# Patient Record
Sex: Male | Born: 1955 | Race: White | Hispanic: No | Marital: Single | Smoking: Current every day smoker
Health system: Southern US, Community
[De-identification: ages and names within clinical notes are randomized; demographics above are authoritative.]

## PROBLEM LIST (undated history)

## (undated) DIAGNOSIS — R251 Tremor, unspecified: Secondary | ICD-10-CM

## (undated) DIAGNOSIS — G35 Multiple sclerosis: Secondary | ICD-10-CM

## (undated) DIAGNOSIS — J449 Chronic obstructive pulmonary disease, unspecified: Secondary | ICD-10-CM

## (undated) DIAGNOSIS — M549 Dorsalgia, unspecified: Secondary | ICD-10-CM

## (undated) DIAGNOSIS — R0600 Dyspnea, unspecified: Secondary | ICD-10-CM

## (undated) DIAGNOSIS — R011 Cardiac murmur, unspecified: Secondary | ICD-10-CM

## (undated) HISTORY — DX: Tremor, unspecified: R25.1

## (undated) HISTORY — PX: TONSILLECTOMY: SUR1361

## (undated) HISTORY — DX: Chronic obstructive pulmonary disease, unspecified: J44.9

## (undated) HISTORY — DX: Multiple sclerosis: G35

## (undated) HISTORY — PX: OTHER SURGICAL HISTORY: SHX169

## (undated) HISTORY — PX: ADENOIDECTOMY: SUR15

---

## 2001-02-24 ENCOUNTER — Emergency Department (HOSPITAL_COMMUNITY): Admission: EM | Admit: 2001-02-24 | Discharge: 2001-02-24 | Payer: Self-pay | Admitting: Emergency Medicine

## 2004-07-09 ENCOUNTER — Emergency Department (HOSPITAL_COMMUNITY): Admission: EM | Admit: 2004-07-09 | Discharge: 2004-07-09 | Payer: Self-pay | Admitting: Family Medicine

## 2007-01-03 ENCOUNTER — Emergency Department (HOSPITAL_COMMUNITY): Admission: EM | Admit: 2007-01-03 | Discharge: 2007-01-04 | Payer: Self-pay | Admitting: Emergency Medicine

## 2007-01-23 ENCOUNTER — Ambulatory Visit: Payer: Self-pay | Admitting: Internal Medicine

## 2008-10-17 ENCOUNTER — Emergency Department (HOSPITAL_COMMUNITY): Admission: EM | Admit: 2008-10-17 | Discharge: 2008-10-17 | Payer: Self-pay | Admitting: Family Medicine

## 2008-12-29 ENCOUNTER — Encounter: Payer: Self-pay | Admitting: Pulmonary Disease

## 2008-12-29 ENCOUNTER — Emergency Department (HOSPITAL_COMMUNITY): Admission: EM | Admit: 2008-12-29 | Discharge: 2008-12-29 | Payer: Self-pay | Admitting: Emergency Medicine

## 2009-02-17 ENCOUNTER — Ambulatory Visit: Payer: Self-pay | Admitting: Pulmonary Disease

## 2009-02-17 DIAGNOSIS — R0602 Shortness of breath: Secondary | ICD-10-CM

## 2009-02-17 DIAGNOSIS — J438 Other emphysema: Secondary | ICD-10-CM | POA: Insufficient documentation

## 2009-02-17 DIAGNOSIS — R930 Abnormal findings on diagnostic imaging of skull and head, not elsewhere classified: Secondary | ICD-10-CM | POA: Insufficient documentation

## 2009-03-02 ENCOUNTER — Ambulatory Visit: Payer: Self-pay | Admitting: Pulmonary Disease

## 2009-03-13 ENCOUNTER — Encounter: Payer: Self-pay | Admitting: Pulmonary Disease

## 2009-04-10 ENCOUNTER — Ambulatory Visit: Payer: Self-pay | Admitting: Pulmonary Disease

## 2009-05-04 ENCOUNTER — Ambulatory Visit: Payer: Self-pay | Admitting: Internal Medicine

## 2009-11-06 ENCOUNTER — Emergency Department (HOSPITAL_COMMUNITY): Admission: EM | Admit: 2009-11-06 | Discharge: 2009-11-06 | Payer: Self-pay | Admitting: Emergency Medicine

## 2009-12-04 ENCOUNTER — Ambulatory Visit: Payer: Self-pay | Admitting: Pulmonary Disease

## 2010-06-15 ENCOUNTER — Telehealth (INDEPENDENT_AMBULATORY_CARE_PROVIDER_SITE_OTHER): Payer: Self-pay | Admitting: *Deleted

## 2010-08-17 ENCOUNTER — Emergency Department (HOSPITAL_COMMUNITY)
Admission: EM | Admit: 2010-08-17 | Discharge: 2010-08-17 | Payer: Self-pay | Source: Home / Self Care | Admitting: Emergency Medicine

## 2010-08-28 NOTE — Assessment & Plan Note (Signed)
Summary: rov for emphysema, cough   CC:  Pt is here for a 6 month f/u appt.  Pt states Symbicort is not helping with sob.  Pt c/o increased coughing and has difficulty coughing up sputum.  when he can cough up sputum and it is green in color.  pt also c/o tightness in chest.  Pt has decreased smoking down to 1/2 ppd.  .  History of Present Illness: The pt comes in today for f/u of his known obstructive disease and ongoing cough.  He is overdue for his f/u visit.  At the last visit, he was started on symbicort and asked to stop smoking.  Unfortunately, he continues to smoke at least a 1/2ppd, and is continuing to have a chronic cough that is debilitating at times.  He does not feel the symbicort has helped either his cough or his sob.  His cough produces very little mucus.    Medications Prior to Update: 1)  Symbicort 160-4.5 Mcg/act  Aero (Budesonide-Formoterol Fumarate) .... Two Puffs Twice Daily  Allergies (verified): 1)  Vicodin  Social History: Patient is a current smoker.  1 ppd x 70yrs..  currently smoking 1/2 ppd.  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  Pt states Symbicort is not helping with sob.  Pt c/o increased coughing and has difficulty coughing up sputum.  when he can cough up sputum, it is green in color.  pt also c/o tightness in chest.  Pt has decreased smoking down to 1/2 ppd.   Comments Medications reviewed with patient Arman Filter LPN  Dec 05, 3555 3:46 PM    Physical Exam  General:  o/w   male in nad Lungs:  isolated wheeze at left base, o/w completely clear. Heart:  rrr, no mrg Extremities:  no edema or cyanosis Neurologic:  alert and oriented, moves all 4.   Impression & Recommendations:  Problem # 1:  EMPHYSEMA (ICD-492.8)  the pt has moderate obstructive disease on pfts last year, and I suspect most of that is fixed and therefore due to emphysema.  He has not seen a big difference with symbicort on his cough or shortness of breath, however he continues to smoke at least a 1/2 ppd.  I will try him on spiriva, but explained that his cough or breathing is unlikely to improve unless he quits smoking.  I have asked him to also try tessalon pearls for his cough, and an otc ppi for a few weeks to see if he notices a difference.  Ultimately, I think smoking cessation is the answer.  He will update me on his progress with spiriva in 3 weeks, and may need a f/u cxr if cough is persisting.  Medications Added to Medication List This Visit: 1)  Tessalon Perles 100 Mg Caps (Benzonatate) .Marland KitchenMarland KitchenMarland Kitchen  Two by mouth every 6 hrs if needed for cough  Other Orders: Est. Patient Level III (16109)  Patient Instructions: 1)  stop smoking.  Your cough is unlikely to go away until this is done. 2)  stop symbicort, and start spiriva one each am. 3)  can try tessalon pearls 2 every 6 hrs if needed for cough. 4)  consider trying prilosec otc one before evening meal 5)  give me some feedback in 3 weeks with how things are going.  Prescriptions: TESSALON PERLES 100 MG  CAPS (BENZONATATE) two by mouth every 6 hrs if needed for cough  #40 x 2   Entered and Authorized by:   Barbaraann Share MD   Signed by:   Barbaraann Share MD on 12/04/2009   Method used:   Print then Give to Patient   RxID:   [id number]

## 2010-08-28 NOTE — Progress Notes (Signed)
  Phone Note Other Incoming   Request: Send information Summary of Call: Request received from Disability Determination Services forwarded to [facility].       

## 2010-10-18 ENCOUNTER — Emergency Department (HOSPITAL_COMMUNITY)
Admission: EM | Admit: 2010-10-18 | Discharge: 2010-10-19 | Disposition: A | Discharge status: Home / Self Care | Payer: Self-pay | Attending: Emergency Medicine | Admitting: Emergency Medicine

## 2010-10-18 DIAGNOSIS — J449 Chronic obstructive pulmonary disease, unspecified: Secondary | ICD-10-CM | POA: Insufficient documentation

## 2010-10-18 DIAGNOSIS — M545 Low back pain, unspecified: Secondary | ICD-10-CM | POA: Insufficient documentation

## 2010-10-18 DIAGNOSIS — IMO0002 Reserved for concepts with insufficient information to code with codable children: Secondary | ICD-10-CM | POA: Insufficient documentation

## 2010-10-18 DIAGNOSIS — M533 Sacrococcygeal disorders, not elsewhere classified: Secondary | ICD-10-CM | POA: Insufficient documentation

## 2010-10-18 DIAGNOSIS — J4489 Other specified chronic obstructive pulmonary disease: Secondary | ICD-10-CM | POA: Insufficient documentation

## 2010-11-05 LAB — DIFFERENTIAL
Basophils Absolute: 0 10*3/uL (ref 0.0–0.1)
Basophils Relative: 1 % (ref 0–1)
Eosinophils Absolute: 0.1 10*3/uL (ref 0.0–0.7)
Eosinophils Relative: 3 % (ref 0–5)
Lymphocytes Relative: 29 % (ref 12–46)
Lymphs Abs: 1.5 10*3/uL (ref 0.7–4.0)
Monocytes Absolute: 0.3 10*3/uL (ref 0.1–1.0)
Monocytes Relative: 7 % (ref 3–12)
Neutro Abs: 3.2 10*3/uL (ref 1.7–7.7)
Neutrophils Relative %: 62 % (ref 43–77)

## 2010-11-05 LAB — CBC
HCT: 53.8 % — ABNORMAL HIGH (ref 39.0–52.0)
Hemoglobin: 18.6 g/dL — ABNORMAL HIGH (ref 13.0–17.0)
MCHC: 34.6 g/dL (ref 30.0–36.0)
MCV: 97.4 fL (ref 78.0–100.0)
Platelets: 230 10*3/uL (ref 150–400)
RBC: 5.52 MIL/uL (ref 4.22–5.81)
RDW: 12.6 % (ref 11.5–15.5)
WBC: 5.1 10*3/uL (ref 4.0–10.5)

## 2010-11-05 LAB — BASIC METABOLIC PANEL
BUN: 9 mg/dL (ref 6–23)
CO2: 24 mEq/L (ref 19–32)
Calcium: 9 mg/dL (ref 8.4–10.5)
Chloride: 106 mEq/L (ref 96–112)
Creatinine, Ser: 0.73 mg/dL (ref 0.4–1.5)
GFR calc Af Amer: >60 mL/min (ref >60)
GFR calc non Af Amer: >60 mL/min (ref >60)
Glucose, Bld: 93 mg/dL (ref 70–99)
Potassium: 4.2 mEq/L (ref 3.5–5.1)
Sodium: 137 mEq/L (ref 135–145)

## 2010-11-22 ENCOUNTER — Emergency Department (HOSPITAL_COMMUNITY)
Admission: EM | Admit: 2010-11-22 | Discharge: 2010-11-22 | Disposition: A | Discharge status: Home / Self Care | Payer: Self-pay | Attending: Emergency Medicine | Admitting: Emergency Medicine

## 2010-11-22 DIAGNOSIS — G8929 Other chronic pain: Secondary | ICD-10-CM | POA: Insufficient documentation

## 2010-11-22 DIAGNOSIS — J449 Chronic obstructive pulmonary disease, unspecified: Secondary | ICD-10-CM | POA: Insufficient documentation

## 2010-11-22 DIAGNOSIS — IMO0002 Reserved for concepts with insufficient information to code with codable children: Secondary | ICD-10-CM | POA: Insufficient documentation

## 2010-11-22 DIAGNOSIS — M545 Low back pain, unspecified: Secondary | ICD-10-CM | POA: Insufficient documentation

## 2010-11-22 DIAGNOSIS — M79609 Pain in unspecified limb: Secondary | ICD-10-CM | POA: Insufficient documentation

## 2010-11-22 DIAGNOSIS — R259 Unspecified abnormal involuntary movements: Secondary | ICD-10-CM | POA: Insufficient documentation

## 2010-11-22 DIAGNOSIS — J4489 Other specified chronic obstructive pulmonary disease: Secondary | ICD-10-CM | POA: Insufficient documentation

## 2011-03-11 ENCOUNTER — Other Ambulatory Visit (HOSPITAL_COMMUNITY): Payer: Self-pay | Admitting: Family Medicine

## 2011-03-11 DIAGNOSIS — M545 Low back pain: Secondary | ICD-10-CM

## 2011-03-14 ENCOUNTER — Ambulatory Visit (HOSPITAL_COMMUNITY)
Admission: RE | Admit: 2011-03-14 | Discharge: 2011-03-14 | Disposition: A | Discharge status: Home / Self Care | Payer: Self-pay | Source: Ambulatory Visit | Attending: Family Medicine | Admitting: Family Medicine

## 2011-03-14 DIAGNOSIS — M5137 Other intervertebral disc degeneration, lumbosacral region: Secondary | ICD-10-CM | POA: Insufficient documentation

## 2011-03-14 DIAGNOSIS — M545 Low back pain: Secondary | ICD-10-CM

## 2011-03-14 DIAGNOSIS — M51379 Other intervertebral disc degeneration, lumbosacral region without mention of lumbar back pain or lower extremity pain: Secondary | ICD-10-CM | POA: Insufficient documentation

## 2011-04-14 ENCOUNTER — Emergency Department (HOSPITAL_COMMUNITY)
Admission: EM | Admit: 2011-04-14 | Discharge: 2011-04-14 | Disposition: A | Discharge status: Home / Self Care | Payer: Self-pay | Attending: Emergency Medicine | Admitting: Emergency Medicine

## 2011-04-14 DIAGNOSIS — Z87891 Personal history of nicotine dependence: Secondary | ICD-10-CM | POA: Insufficient documentation

## 2011-04-14 DIAGNOSIS — M545 Low back pain, unspecified: Secondary | ICD-10-CM | POA: Insufficient documentation

## 2011-06-15 ENCOUNTER — Emergency Department (HOSPITAL_COMMUNITY)
Admission: EM | Admit: 2011-06-15 | Discharge: 2011-06-15 | Disposition: A | Discharge status: Home / Self Care | Payer: Self-pay | Attending: Emergency Medicine | Admitting: Emergency Medicine

## 2011-06-15 ENCOUNTER — Encounter: Payer: Self-pay | Admitting: Emergency Medicine

## 2011-06-15 DIAGNOSIS — M545 Low back pain, unspecified: Secondary | ICD-10-CM | POA: Insufficient documentation

## 2011-06-15 DIAGNOSIS — F17200 Nicotine dependence, unspecified, uncomplicated: Secondary | ICD-10-CM | POA: Insufficient documentation

## 2011-06-15 HISTORY — DX: Dorsalgia, unspecified: M54.9

## 2011-06-15 MED ORDER — OXYCODONE-ACETAMINOPHEN 5-325 MG PO TABS: 1.0000 | ORAL_TABLET | Freq: Four times a day (QID) | ORAL | Status: AC | PRN

## 2011-06-15 MED ORDER — ONDANSETRON 4 MG PO TBDP
ORAL_TABLET | ORAL | Status: AC
  Administered 2011-06-15: 8 mg
  Filled 2011-06-15: qty 2

## 2011-06-15 MED ORDER — ONDANSETRON HCL 8 MG PO TABS: 8.0000 mg | ORAL_TABLET | Freq: Once | ORAL | Status: DC

## 2011-06-15 MED ORDER — OXYCODONE-ACETAMINOPHEN 5-325 MG PO TABS
1.0000 | ORAL_TABLET | Freq: Once | ORAL | Status: AC
  Administered 2011-06-15: 1 via ORAL
  Filled 2011-06-15: qty 1

## 2011-06-15 NOTE — ED Notes (Signed)
Pt reports low back pain onset last night. Pt lifting heavy object. Pain starts on left side and radiates to right side.

## 2011-06-15 NOTE — ED Provider Notes (Signed)
History     CSN: [id number] Arrival date & time: 06/15/2011  2:10 PM   First MD Initiated Contact with Patient 06/15/11 1500      Chief Complaint  Patient presents with  . Back Pain    (Consider location/radiation/quality/duration/timing/severity/associated sxs/prior treatment) The history is provided by the patient.   reports ongoing low back pain has been present for several months.  He had an MRI scan done demonstrating some disc protrusions and central canal narrowing without acute pathology.  He follows that [facility].  The patient has nothing for pain.  He normally gets his pain medicine from the emergency department.  Denies urinary frequency retention or hesitancy.  He denies dysuria.  He denies nausea vomiting or abdominal pain.  Denies unilateral leg weakness.  He denies perineal numbness.  Reports his symptoms are similar to his back pain in the past except as pain on his right side is a little more severe and radiates to his right thigh.  Nothing worsens the symptoms symptoms.  Nothing improves his symptoms.   Past Medical History  Diagnosis Date  . Back pain     Past Surgical History  Procedure Date  . Tonsillectomy   . Adenoidectomy     History reviewed. No pertinent family history.  History  Substance Use Topics  . Smoking status: Current Everyday Smoker  . Smokeless tobacco: Never Used  . Alcohol Use: No     quit 2 years ago      Review of Systems  All other systems reviewed and are negative.    Allergies  Hydrocodone-acetaminophen  Home Medications  No current outpatient prescriptions on file.  BP 126/79  Pulse 85  Temp(Src) 98.3 F (36.8 C) (Oral)  Resp 22  SpO2 97%  Physical Exam  Nursing note and vitals reviewed. Constitutional: He is oriented to person, place, and time. He appears well-developed and well-nourished.  HENT:  Head: Normocephalic and atraumatic.  Eyes: EOM are normal.  Neck: Normal range of motion.    Cardiovascular: Normal rate, regular rhythm, normal heart sounds and intact distal pulses.   Pulmonary/Chest: Effort normal and breath sounds normal. No respiratory distress.  Abdominal: Soft. He exhibits no distension. There is no tenderness.  Musculoskeletal:       Father 5 strength in bilateral lower extremities with normal reflexes.  Neurological: He is alert and oriented to person, place, and time.  Skin: Skin is warm and dry.  Psychiatric: He has a normal mood and affect. Judgment normal.    ED Course  Procedures (including critical care time)  Labs Reviewed - No data to display No results found. MRI LUMBAR SPINE WITHOUT CONTRAST  Technique: Multiplanar and multiecho pulse sequences of the lumbar  spine were obtained without intravenous contrast.  Comparison: None.  Findings: Vertebral body height is maintained. 0.3 cm of  retrolisthesis of L3 on L4 is noted. Alignment is otherwise  unremarkable. Marrow signal is somewhat heterogeneous with  scattered degenerative discogenic marrow signal change also noted.  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 The conus medullaris is normal in  signal and position. Imaged intra-abdominal contents are  unremarkable.  The T10-11 and T11-12 levels are imaged in the sagittal plane only  and negative.  T12-L1: Negative.  L1-2: Negative.  L2-3: Negative.  L3-4: The patient has a disc bulge eccentrically prominent to the  right and epidural fat is prominent. There is moderate central  canal narrowing and right lateral recess narrowing. Right foramen  is also mildly narrowed.  Left foramen open.  L4-5: Disc bulge causes mild narrowing of the lateral recesses,  greater on the right. Thecal sac is patent. Foramina are open.  L5-S1: Mild disc bulge with some endplate spurring is seen.  Central canal and right foramina widely patent. Mild left  foraminal narrowing noted.  IMPRESSION:  1. 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 Mild right foraminal narrowing  is also present at this level.  2. Disc bulge at L4-5 causes mild narrowing of the lateral  recesses.   1. Low back pain       MDM  Normal lower extremity neurologic exam. No bowel or bladder complaints.  Likely musculoskeletal back pain. Doubt spinal epidural abscess. Doubt cauda equina. Doubt abdominal aortic aneurysm  Home with pain medicine and followup with his primary care physician Dr. Orson Aloe, MD 06/15/11 [phone]

## 2011-10-07 ENCOUNTER — Encounter (HOSPITAL_COMMUNITY): Payer: Self-pay | Admitting: *Deleted

## 2011-10-07 ENCOUNTER — Emergency Department (HOSPITAL_COMMUNITY)
Admission: EM | Admit: 2011-10-07 | Discharge: 2011-10-07 | Disposition: A | Discharge status: Home / Self Care | Payer: Self-pay | Attending: Emergency Medicine | Admitting: Emergency Medicine

## 2011-10-07 DIAGNOSIS — IMO0002 Reserved for concepts with insufficient information to code with codable children: Secondary | ICD-10-CM

## 2011-10-07 DIAGNOSIS — G8929 Other chronic pain: Secondary | ICD-10-CM | POA: Insufficient documentation

## 2011-10-07 DIAGNOSIS — F17200 Nicotine dependence, unspecified, uncomplicated: Secondary | ICD-10-CM | POA: Insufficient documentation

## 2011-10-07 DIAGNOSIS — M51379 Other intervertebral disc degeneration, lumbosacral region without mention of lumbar back pain or lower extremity pain: Secondary | ICD-10-CM | POA: Insufficient documentation

## 2011-10-07 DIAGNOSIS — M5137 Other intervertebral disc degeneration, lumbosacral region: Secondary | ICD-10-CM | POA: Insufficient documentation

## 2011-10-07 DIAGNOSIS — M549 Dorsalgia, unspecified: Secondary | ICD-10-CM

## 2011-10-07 MED ORDER — TRAMADOL HCL 50 MG PO TABS
100.0000 mg | ORAL_TABLET | Freq: Once | ORAL | Status: AC
  Administered 2011-10-07: 100 mg via ORAL
  Filled 2011-10-07: qty 2

## 2011-10-07 MED ORDER — TRAMADOL HCL 50 MG PO TABS: 50.0000 mg | ORAL_TABLET | Freq: Four times a day (QID) | ORAL | Status: AC | PRN

## 2011-10-07 MED ORDER — METHOCARBAMOL 500 MG PO TABS: 500.0000 mg | ORAL_TABLET | Freq: Two times a day (BID) | ORAL | Status: AC

## 2011-10-07 NOTE — ED Provider Notes (Signed)
History     CSN: [id number]  Arrival date & time 10/07/11  1607   First MD Initiated Contact with Patient 10/07/11 2004      Chief Complaint  Patient presents with  . Back Pain     HPI  History provided by the patient. Patient is a 56 year old male with histories of chronic back pain who presents with increased low back pain beginning this morning after taking out the trash. Patient states he has had chronic back pains off and on for the past 3-4 years. Patient is currently without medical insurance reports difficulty having care for his back pain symptoms. [facility] and does report having 2 visits to [facility]opedics. Patient also reports having an MRI performed here at the hospital last year. He states he had some bulging discs and was recommended to have injections as treatment. Patient has been unable to followup for these treatments. Currently patient states he is taking a muscle relaxer prescribed from [facility]. This morning after twisting and taking out the trash he had increased low back pains. He tried taking muscle relaxer without improvement. Pain is worse with movements and walking. Pain does not radiate it stays in the low back. He denies any associated lower leg numbness or weakness. Denies urinary or fecal incontinence, urinary retention peroneal numbness. Patient requesting help with pain control and symptom management.   Past Medical History  Diagnosis Date  . Back pain     Past Surgical History  Procedure Date  . Tonsillectomy   . Adenoidectomy     History reviewed. No pertinent family history.  History  Substance Use Topics  . Smoking status: Current Everyday Smoker  . Smokeless tobacco: Never Used  . Alcohol Use: No     quit 2 years ago      Review of Systems  All other systems reviewed and are negative.    Allergies  Hydrocodone-acetaminophen  Home Medications  No current outpatient prescriptions  on file.  BP 129/80  Pulse 85  Temp(Src) 96.7 F (35.9 C) (Oral)  Resp 15  SpO2 98%  Physical Exam  Nursing note and vitals reviewed. Constitutional: He is oriented to person, place, and time. He appears well-developed and well-nourished. No distress.  HENT:  Head: Normocephalic and atraumatic.  Neck: Normal range of motion. Neck supple.  Cardiovascular: Normal rate and regular rhythm.   Pulmonary/Chest: Effort normal and breath sounds normal. He has no wheezes. He has no rales.  Abdominal: Soft. He exhibits no distension. There is no tenderness. There is no rebound.  Musculoskeletal: He exhibits no edema and no tenderness.       Lumbar back: He exhibits tenderness.       Back:  Neurological: He is alert and oriented to person, place, and time. He has normal strength. No sensory deficit. Gait normal.  Reflex Scores:      Patellar reflexes are 2+ on the right side and 1+ on the left side. Skin: Skin is warm. No rash noted. No erythema.  Psychiatric: He has a normal mood and affect. His behavior is normal.    ED Course  Procedures     1. Chronic back pain   2. Bulging disc       MDM  8:05 PM patient seen and evaluated. Patient no acute distress.        Angus Seller, [place] 10/09/11 [phone]

## 2011-10-07 NOTE — Discharge Instructions (Signed)
You were seen and evaluated today for your complaints of low back pains. It is recommended that he continue to followup with the primary care provider for continued evaluation and treatment. Return to emergency room if you develop any worsening or severe pains, weakness or paralysis in your lower legs, or loss of control your bowels or bladder.  Back Exercises Back exercises help treat and prevent back injuries. The goal of back exercises is to increase the strength of your abdominal and back muscles and the flexibility of your back. These exercises should be started when you no longer have back pain. Back exercises include:  Pelvic Tilt. Lie on your back with your knees bent. Tilt your pelvis until the lower part of your back is against the floor. Hold this position 5 to 10 sec and repeat 5 to 10 times.   Knee to Chest. Pull first 1 knee up against your chest and hold for 20 to 30 seconds, repeat this with the other knee, and then both knees. This may be done with the other leg straight or bent, whichever feels better.   Sit-Ups or Curl-Ups. Bend your knees 90 degrees. Start with tilting your pelvis, and do a partial, slow sit-up, lifting your trunk only 30 to 45 degrees off the floor. Take at least 2 to 3 seconds for each sit-up. Do not do sit-ups with your knees out straight. If partial sit-ups are difficult, simply do the above but with only tightening your abdominal muscles and holding it as directed.   Hip-Lift. Lie on your back with your knees flexed 90 degrees. Push down with your feet and shoulders as you raise your hips a couple inches off the floor; hold for 10 seconds, repeat 5 to 10 times.   Back arches. Lie on your stomach, propping yourself up on bent elbows. Slowly press on your hands, causing an arch in your low back. Repeat 3 to 5 times. Any initial stiffness and discomfort should lessen with repetition over time.   Shoulder-Lifts. Lie face down with arms beside your body. Keep hips  and torso pressed to floor as you slowly lift your head and shoulders off the floor.  Do not overdo your exercises, especially in the beginning. Exercises may cause you some mild back discomfort which lasts for a few minutes; however, if the pain is more severe, or lasts for more than 15 minutes, do not continue exercises until you see your caregiver. Improvement with exercise therapy for back problems is slow.  See your caregivers for assistance with developing a proper back exercise program. Document Released: 08/22/2004 Document Revised: 07/04/2011 Document Reviewed: 07/15/2005 [facility] Patient Information 2012 [place], [place].   Chronic Back Pain When back pain lasts longer than 3 months, it is called chronic back pain.This pain can be frustrating, but the cause of the pain is rarely dangerous.People with chronic back pain often go through certain periods that are more intense (flare-ups). CAUSES Chronic back pain can be caused by wear and tear (degeneration) on different structures in your back. These structures may include bones, ligaments, or discs. This degeneration may result in more pressure being placed on the nerves that travel to your legs and feet. This can lead to pain traveling from the low back down the back of the legs. When pain lasts longer than 3 months, it is not unusual for people to experience anxiety or depression. Anxiety and depression can also contribute to low back pain. TREATMENT  Establish a regular exercise plan. This is  critical to improving your functional level.   Have a self-management plan for when you flare-up. Flare-ups rarely require a medical visit. Regular exercise will help reduce the intensity and frequency of your flare-ups.   Manage how you feel about your back pain and the rest of your life. Anxiety, depression, and feeling that you cannot alter your back pain have been shown to make back pain more intense and debilitating.   Medicines should never  be your only treatment. They should be used along with other treatments to help you return to a more active lifestyle.   Procedures such as injections or surgery may be helpful but are rarely necessary. You may be able to get the same results with physical therapy or chiropractic care.  HOME CARE INSTRUCTIONS  Avoid bending, heavy lifting, prolonged sitting, and activities which make the problem worse.   Continue normal activity as much as possible.   Take brief periods of rest throughout the day to reduce your pain during flare-ups.   Follow your back exercise rehabilitation program. This can help reduce symptoms and prevent more pain.   Only take over-the-counter or prescription medicines as directed by your caregiver. Muscle relaxants are sometimes prescribed. Narcotic pain medicine is discouraged for long-term pain, since addiction is a possible outcome.   If you smoke, quit.   Eat healthy foods and maintain a recommended body weight.  SEEK IMMEDIATE MEDICAL CARE IF:   You have weakness or numbness in one of your legs or feet.   You have trouble controlling your bladder or bowels.   You develop nausea, vomiting, abdominal pain, shortness of breath, or fainting.  Document Released: 08/22/2004 Document Revised: 07/04/2011 Document Reviewed: 06/29/2011 [facility] Patient Information 2012 [place], [place].    RESOURCE GUIDE  Dental Problems  Patients with Medicaid: [facility] [phone] [address]                                           [phone] [address] Phone:  [phone]                                                  Phone:  [phone]  If unable to pay or uninsured, contact:  [facility] or [facility] to become qualified for the adult dental clinic.  Chronic Pain Problems Contact [facility]  [phone] Patients need to be referred by their primary care doctor.  Insufficient Money  for Medicine Contact [employer] Way:  call "211" or [facility] [phone].  No Primary Care Doctor Call [facility]  [phone] Other agencies that provide inexpensive medical care    [facility]  [phone]    [facility]  [phone]    [facility]  [phone]    [facility]  [phone]    [facility]  [phone]    [facility]  [phone]  Psychological Services [facility]  [phone] [facility]  [phone] [facility] Mental [facility]   [phone] (emergency services ([phone])  Substance Abuse Resources [facility]  ([phone]  [facility] 205-088-8475 [facility] 325-742-4733 Floydene Flock ([phone] Residential & Outpatient Substance Abuse Program  6140270621  Abuse/Neglect [facility] Child Abuse Hotline 509-318-4518 [facility] Child Abuse Hotline 4408748193 (After Hours)  Emergency Shelter [facility] [phone]) [phone]  Maternity Homes [facility] at the [place] [facility] [place] 8137598705 [facility] ([phone]  MRSA Hotline #:   386 540 3528    [facility] Resources  [facility]     [employer]                          [facility] [address] [place]                       [address]      [address]  Blondell Reveal Phone:  [phone]                                   Phone:  [phone]                 Phone:  [phone]  [facility] Mental Health Phone:  [phone]  [facility] Child Abuse Hotline 5177697897 [phone] (After Hours)

## 2011-10-07 NOTE — ED Notes (Signed)
To ed for eval of lower back pain. States he 'pulled it this morning when moving trash cans'. Ambulatory into triage. States he has had this pain before and treated with injections.

## 2011-10-09 NOTE — ED Provider Notes (Signed)
Medical screening examination/treatment/procedure(s) were performed by non-physician practitioner and as supervising physician I was immediately available for consultation/collaboration.  Cyndra Numbers, MD 10/09/11 [phone]

## 2011-12-17 ENCOUNTER — Encounter (HOSPITAL_COMMUNITY): Payer: Self-pay | Admitting: Physical Medicine and Rehabilitation

## 2011-12-17 ENCOUNTER — Emergency Department (HOSPITAL_COMMUNITY)
Admission: EM | Admit: 2011-12-17 | Discharge: 2011-12-17 | Disposition: A | Discharge status: Home / Self Care | Payer: Self-pay | Attending: Emergency Medicine | Admitting: Emergency Medicine

## 2011-12-17 DIAGNOSIS — M545 Low back pain, unspecified: Secondary | ICD-10-CM | POA: Insufficient documentation

## 2011-12-17 DIAGNOSIS — M79609 Pain in unspecified limb: Secondary | ICD-10-CM | POA: Insufficient documentation

## 2011-12-17 DIAGNOSIS — G8929 Other chronic pain: Secondary | ICD-10-CM | POA: Insufficient documentation

## 2011-12-17 LAB — URINALYSIS, ROUTINE W REFLEX MICROSCOPIC
Bilirubin Urine: NEGATIVE
Hgb urine dipstick: NEGATIVE
Ketones, ur: NEGATIVE mg/dL
Leukocytes, UA: NEGATIVE
Nitrite: NEGATIVE
Protein, ur: NEGATIVE mg/dL
Specific Gravity, Urine: 1 — ABNORMAL LOW (ref 1.005–1.030)
Urobilinogen, UA: 0.2 mg/dL (ref 0.0–1.0)

## 2011-12-17 MED ORDER — OXYCODONE-ACETAMINOPHEN 5-325 MG PO TABS: 2.0000 | ORAL_TABLET | Freq: Four times a day (QID) | ORAL | Status: AC | PRN

## 2011-12-17 MED ORDER — OXYCODONE-ACETAMINOPHEN 5-325 MG PO TABS
1.0000 | ORAL_TABLET | Freq: Once | ORAL | Status: AC
  Administered 2011-12-17: 1 via ORAL
  Filled 2011-12-17: qty 1

## 2011-12-17 MED ORDER — PREDNISONE (PAK) 10 MG PO TABS: ORAL_TABLET | ORAL | Status: DC

## 2011-12-17 NOTE — ED Provider Notes (Signed)
History     CSN: [id number]  Arrival date & time 12/17/11  1717   First MD Initiated Contact with Patient 12/17/11 1836      Chief Complaint  Patient presents with  . Back Pain    (Consider location/radiation/quality/duration/timing/severity/associated sxs/prior treatment) Patient is a 56 y.o. male presenting with back pain. The history is provided by the patient. No language interpreter was used.  Back Pain  This is a recurrent problem. The current episode started yesterday. The problem occurs constantly. The problem has been gradually worsening. The pain is associated with twisting. The pain is present in the lumbar spine. The quality of the pain is described as shooting. The pain radiates to the left thigh. The pain is moderate. The symptoms are aggravated by bending and twisting. Pertinent negatives include no numbness, no bowel incontinence, no perianal numbness, no bladder incontinence and no weakness. He has tried NSAIDs for the symptoms. The treatment provided no relief.    Past Medical History  Diagnosis Date  . Back pain     Past Surgical History  Procedure Date  . Tonsillectomy   . Adenoidectomy     History reviewed. No pertinent family history.  History  Substance Use Topics  . Smoking status: Current Everyday Smoker  . Smokeless tobacco: Never Used  . Alcohol Use: No     quit 2 years ago      Review of Systems  Gastrointestinal: Negative for bowel incontinence.  Genitourinary: Negative for bladder incontinence.  Musculoskeletal: Positive for back pain.  Neurological: Negative for weakness and numbness.  All other systems reviewed and are negative.    Allergies  Hydrocodone-acetaminophen  Home Medications   Current Outpatient Rx  Name Route Sig Dispense Refill  . IBUPROFEN 600 MG PO TABS Oral Take 600 mg by mouth every 6 (six) hours as needed. For pain.    Marland Kitchen TRAMADOL HCL 50 MG PO TABS Oral Take 50 mg by mouth every 6 (six) hours as needed. For  pain.      BP 117/81  Pulse 97  Temp(Src) 98 F (36.7 C) (Oral)  Resp 14  SpO2 95%  Physical Exam  Nursing note and vitals reviewed. Constitutional: He is oriented to person, place, and time. He appears well-developed and well-nourished.  HENT:  Head: Normocephalic.  Eyes: Pupils are equal, round, and reactive to light.  Neck: Normal range of motion. Neck supple.  Cardiovascular: Normal rate, regular rhythm, normal heart sounds and intact distal pulses.   Pulmonary/Chest: Effort normal and breath sounds normal.  Abdominal: Soft. Bowel sounds are normal.  Musculoskeletal: Normal range of motion.       Lumbar back: He exhibits tenderness.       Back:  Lymphadenopathy:    He has no cervical adenopathy.  Neurological: He is alert and oriented to person, place, and time.  Skin: Skin is warm and dry.  Psychiatric: He has a normal mood and affect. His behavior is normal. Judgment and thought content normal.    ED Course  Procedures (including critical care time)  Labs Reviewed  URINALYSIS, ROUTINE W REFLEX MICROSCOPIC - Abnormal; Notable for the following:    Specific Gravity, Urine 1.000 (*)    All other components within normal limits   No results found.   No diagnosis found.   Chronic back pain. MDM          Jimmye Norman, NP 12/17/11 [phone]

## 2011-12-17 NOTE — ED Notes (Signed)
Patient states "has bad knees and chronic back pain".  States "has tramadol at home for same with no relief".

## 2011-12-17 NOTE — ED Notes (Signed)
Pt presents to department for evaluation of chronic lower back pain. States he thinks he strained back yesterday while getting groceries out of car. States "I heard something pop." no relief with pain medication at home. He is alert and oriented x4. 8/10 pain upon arrival to ED. No signs of distress. Ambulatory to triage.

## 2011-12-17 NOTE — ED Notes (Signed)
Discharged home with prescription and written instructions.  No concerns or questions at discharge.

## 2011-12-17 NOTE — Discharge Instructions (Signed)
Sciatica  Sciatica is a name for lower back pain caused by pressure on the sciatic nerve. The back pain can spread to the buttocks and back of the leg.  HOME CARE    Rest as much as possible.   Only take medicine as told by your doctor.   Apply cold or heat to your back as told by your doctor.   Do not bend, lift, or sit for a long time until your pain is better.   Do not do anything that makes the condition worse.   Start normal activity when the pain is better.   Keep all follow-up visits.  GET HELP RIGHT AWAY IF:    There is more pain.   There is weakness or numbness in the legs.   You cannot control when you poop (bowel movement) or pee (urinate).  MAKE SURE YOU:    Understand these instructions.   Will watch this condition.   Will get help right away if you are not doing well or get worse.  Document Released: 04/23/2008 Document Revised: 07/04/2011 Document Reviewed: 04/23/2008  [employer] Patient Information 2012 [employer].

## 2011-12-21 NOTE — ED Provider Notes (Signed)
Medical screening examination/treatment/procedure(s) were performed by non-physician practitioner and as supervising physician I was immediately available for consultation/collaboration.   Shavy Beachem W. Raevon Broom, MD 12/21/11 0900 

## 2012-08-06 ENCOUNTER — Encounter (HOSPITAL_COMMUNITY): Payer: Self-pay | Admitting: Cardiology

## 2012-08-06 ENCOUNTER — Emergency Department (HOSPITAL_COMMUNITY): Payer: Medicaid Other

## 2012-08-06 ENCOUNTER — Emergency Department (HOSPITAL_COMMUNITY)
Admission: EM | Admit: 2012-08-06 | Discharge: 2012-08-06 | Disposition: A | Discharge status: Home / Self Care | Payer: Medicaid Other | Attending: Emergency Medicine | Admitting: Emergency Medicine

## 2012-08-06 DIAGNOSIS — Z87891 Personal history of nicotine dependence: Secondary | ICD-10-CM | POA: Insufficient documentation

## 2012-08-06 DIAGNOSIS — R251 Tremor, unspecified: Secondary | ICD-10-CM

## 2012-08-06 DIAGNOSIS — R259 Unspecified abnormal involuntary movements: Secondary | ICD-10-CM | POA: Insufficient documentation

## 2012-08-06 DIAGNOSIS — R269 Unspecified abnormalities of gait and mobility: Secondary | ICD-10-CM | POA: Insufficient documentation

## 2012-08-06 LAB — COMPREHENSIVE METABOLIC PANEL
ALT: 19 U/L (ref 0–53)
AST: 19 U/L (ref 0–37)
Albumin: 3.5 g/dL (ref 3.5–5.2)
Alkaline Phosphatase: 69 U/L (ref 39–117)
BUN: 7 mg/dL (ref 6–23)
CO2: 27 mEq/L (ref 19–32)
Calcium: 8.7 mg/dL (ref 8.4–10.5)
Chloride: 100 mEq/L (ref 96–112)
Creatinine, Ser: 0.9 mg/dL (ref 0.50–1.35)
GFR calc Af Amer: >90 mL/min (ref >90)
GFR calc non Af Amer: >90 mL/min (ref >90)
Glucose, Bld: 94 mg/dL (ref 70–99)
Potassium: 4 mEq/L (ref 3.5–5.1)
Sodium: 136 mEq/L (ref 135–145)
Total Bilirubin: 1.2 mg/dL (ref 0.3–1.2)
Total Protein: 6.7 g/dL (ref 6.0–8.3)

## 2012-08-06 LAB — CBC WITH DIFFERENTIAL/PLATELET
Basophils Absolute: 0.1 10*3/uL (ref 0.0–0.1)
Basophils Relative: 1 % (ref 0–1)
Eosinophils Absolute: 0.3 10*3/uL (ref 0.0–0.7)
Eosinophils Relative: 4 % (ref 0–5)
HCT: 49.5 % (ref 39.0–52.0)
Hemoglobin: 17.4 g/dL — ABNORMAL HIGH (ref 13.0–17.0)
Lymphocytes Relative: 28 % (ref 12–46)
Lymphs Abs: 2.1 10*3/uL (ref 0.7–4.0)
MCH: 32.5 pg (ref 26.0–34.0)
MCHC: 35.2 g/dL (ref 30.0–36.0)
MCV: 92.4 fL (ref 78.0–100.0)
Monocytes Absolute: 0.6 10*3/uL (ref 0.1–1.0)
Monocytes Relative: 9 % (ref 3–12)
Neutro Abs: 4.4 10*3/uL (ref 1.7–7.7)
Neutrophils Relative %: 59 % (ref 43–77)
Platelets: 235 10*3/uL (ref 150–400)
RBC: 5.36 MIL/uL (ref 4.22–5.81)
RDW: 13.2 % (ref 11.5–15.5)
WBC: 7.4 10*3/uL (ref 4.0–10.5)

## 2012-08-06 LAB — SEDIMENTATION RATE: Sed Rate: 0 mm/hr (ref 0–16)

## 2012-08-06 LAB — CK: Total CK: 48 U/L (ref 7–232)

## 2012-08-06 MED ORDER — OXYCODONE-ACETAMINOPHEN 5-325 MG PO TABS
2.0000 | ORAL_TABLET | Freq: Once | ORAL | Status: AC
  Administered 2012-08-06: 2 via ORAL
  Filled 2012-08-06: qty 2

## 2012-08-06 NOTE — ED Notes (Signed)
Admitting MD at bedside.

## 2012-08-06 NOTE — Consult Note (Signed)
Family Medicine Teaching Service Service Pager: [phone]  Patient name: Dale Edwards Medical record number: [id number] Date of birth: Apr 03, 1956 Age: 57 y.o. Gender: male  Primary Care Provider: Default, Provider, MD  Chief Complaint: "Shaking" in right leg  History of Present Illness: Dale Edwards is a 57 y.o. year old male who presented to the ED complaining of "shaking" in his right leg when he is standing. This has been present for 6 months and accompanied by difficulty walking and intermittent shooting pain in his right posterior leg. He also notes a three year history of decreased sensation in his hands and chronic lower back pain, for which he takes daily percocet.  He denies any recent falls or trauma. He notes that this problem has been evaluated in the past by his PCP, Dr. August Luz, who does not know the cause of the numbness in his hands. He notes that he has had numerous trips to the ED for back pain and has been told that he has sciatica. Currently, he denies any upper extremity weakness, difficulty swallowing, difficulty speaking, or bowel or bladder dysfunction or any other acute changes.     Patient Active Problem List  Diagnosis  . EMPHYSEMA  . DYSPNEA  . Nonspecific (abnormal) findings on radiological and other examination of body structure  . CT, CHEST, ABNORMAL   Past Medical History: Past Medical History  Diagnosis Date  . Back pain    Past Surgical History: Past Surgical History  Procedure Date  . Tonsillectomy   . Adenoidectomy    Social History:  Significant smoking history of > 100 pack years; quit 2 years ago.  Hx of daily  alcohol use; Hx of marijuana use  Family History: History reviewed. No pertinent family history.  Allergies: Allergies  Allergen Reactions  . Hydrocodone-Acetaminophen     REACTION: itching and nauseated   No current facility-administered medications on file prior to encounter.   Current Outpatient Prescriptions on File  Prior to Encounter  Medication Sig Dispense Refill  . albuterol (PROVENTIL HFA;VENTOLIN HFA) 108 (90 BASE) MCG/ACT inhaler Inhale 2 puffs into the lungs every 6 (six) hours as needed.      . tiotropium (SPIRIVA) 18 MCG inhalation capsule Place 18 mcg into inhaler and inhale daily.       Review Of Systems:  Otherwise 12 point review of systems was performed and was unremarkable.  Physical Exam: BP 113/71  Pulse 93  Temp 98 F (36.7 C) (Oral)  Resp 20  SpO2 97% Exam: General: slightly disheveled appearing man, appears somewhat older than stated age, conversant/pleasant, NAD HEENT: PERRLA, EOM intact,  Cardiovascular: RRR, no murmurs/rubs/gallops Respiratory: Lungs clear bilaterally, diminished breath sounds at bases bilaterally Abdomen: normal bowel sounds, non tender, no masses, no guarding Extremities: no edema Skin: intact, no lesions Neuro:  MENTAL STATUS Orientation: A&O x 3 Memory: Recent  memory is intact and remote memory is intact Attention: Attention span is good Language: speech is normal and fluent  CRANIAL NERVES: 2 (Optic): visual fields intact to confrontation 3,4,6 (EOM): pupils equal round and reactive; extra ocular movement intact 5 (Trigeminal):  no facial numbness; muscles of mastication intact 7 (Facial): no facial weakness or asymmetry  8 (Auditory): grossly diminished equally and bilaterally 9,10 (Glossopharyngeal/Vagus): uvula is midline; palate elevate symmetrically 11 (Spinal Accessory): normal sternocleidomastoid and trapezius strength 12 (Hypoglossal) tongue is midline; no fasciculations or atrophy  MOTOR: Muscle Strength: Upper Extremity: 5/5, Lower Extremity: 5/5, Grip: 5/5; No muscular tenderness ROM: passive ROM normal in BL  UE, normal in RLE with reproducible pain with knee extension, LLE limited slightly by pain with hip flexion  REFLEXES: DTR 2+ in BL biceps, triceps, patellar and achilles tendons  COORDINATION: normal finger-nose-finger,  normal heel-to-shin,   SENSATION: vibration, fine-touch, proprioception: normal  GAIT: able to ambulate slowly approx 30 - 40 feet without assistance, unable to walk on toes or heels or toe-to-heel  Labs and Imaging: CBC BMET   Lab 08/06/12 1239  WBC 7.4  HGB 17.4*  HCT 49.5  PLT 235    Lab 08/06/12 1239  NA 136  K 4.0  CL 100  CO2 27  BUN 7  CREATININE 0.90  GLUCOSE 94  CALCIUM 8.7     Ct Head Wo Contrast  08/06/2012  *RADIOLOGY REPORT*  Clinical Data: Right-sided shaking x 6 months  CT HEAD WITHOUT CONTRAST  Technique:  Contiguous axial images were obtained from the base of the skull through the vertex without contrast.  Comparison: None.  Findings: No evidence of parenchymal hemorrhage or extra-axial fluid collection. No mass lesion, mass effect, or midline shift.  No CT evidence of acute infarction. 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 A demyelinating disorder such as MS is considered less likely.  Global cortical atrophy.  No ventriculomegaly.  Visualized paranasal sinuses are essentially clear.  Mild partial opacification of the bilateral mastoid air cells.  No evidence of calvarial fracture.  IMPRESSION: No evidence of acute intracranial abnormality.  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  Periventricular and subcortical white matter hypodensities, favored to reflect small vessel ischemic changes.   Original Report Authenticated By: Charline Bills, M.D.      Assessment and Plan: Dale Edwards is a 57 y.o. year old male with chronic back pain pain, presenting with subjective worsening shaking and weakness in his R leg. After a thorough history and examination, I could not find any evidence of tremors of the extremities, weakness, or  focal neurologic deficits. His symptoms and physical exam and consistent with radiculopathy, which is consistent with his 2012 MRI findings of L3-L4 spinal stenosis and L4-L5 bulging disc. The differential for his subjective symptoms include infectious and inflammatory myopathies, but I feel that these are less likely. I will draw the following labs to assess - ESR, CRP, HIV, and RPR. However, given the patient's normal physical exam, laboratory evaluation, and lack of acute findings on CT scan of his head, I do not find any reason for admission for the patient.    I called and discussed my findings with C. Steward, MD, the neurologist on call. He said that if the patient has no acute symptoms and no pertinent findings on physical exam, then the patient does not warrant admission and is appropriate for outpatient evaluation. I attempted to convey this message to [facility] However, she was gone for the day and had already completed her note stating that the patient was to be admitted to the hospital. I discussed the plan at length with Dale Edwards, and he was agreeable to an outpatient follow up with his PCP. Additionally, I told Dale Edwards that I would call him tomorrow with the results of the labs tests and mail him a copy. He was agreeable to this as well.    Dale Raider Clinton Sawyer, MD, MBA 08/06/2012, 6:44 PM Family Medicine Resident,  PGY-2 [phone] pager

## 2012-08-06 NOTE — ED Notes (Signed)
Dr. Ignacia Palma wrote discharge instructions for Dr. Adriana Simas on our downtime discharge sheet.

## 2012-08-06 NOTE — H&P (Deleted)
Enterred in error. Please see consultation note.

## 2012-08-06 NOTE — ED Notes (Signed)
Pt reports he is having episodes of shaking for the past 4 months. States he has them in his legs when he stands and with activity. No neuro deficits noted at triage. Pt A&Ox4. States he sees Dr Mikeal Hawthorne as his PCP.

## 2012-08-06 NOTE — ED Notes (Signed)
Patient is alert and orientedx4. Patient was explained discharge instructions and he understood them.  Elmo Putt, significant other is taking the patient home.

## 2012-08-06 NOTE — ED Notes (Signed)
States has had shaking in Right leg for 6 months, only when standing, walking, on feet for any length of time. Also states both hands are numb and tingling,

## 2012-08-06 NOTE — ED Provider Notes (Signed)
History     CSN: [id number]  Arrival date & time 08/06/12  1137   First MD Initiated Contact with Patient 08/06/12 1155      Chief Complaint  Patient presents with  . Shaking     (Consider location/radiation/quality/duration/timing/severity/associated sxs/prior treatment) HPI  Dale Edwards is a 57 y.o. male complaining of increasing shakiness developing into difficulty ambulating over the course of the last 6 months. Patient states the shakiness increases with standing and activity as. He now ambulates with a cane. Denies dysarthria, difficulty understanding words, unilateral weakness, chest pain, shortness of breath, abdominal pain, change in bowel or bladder habits.  Past Medical History  Diagnosis Date  . Back pain     Past Surgical History  Procedure Date  . Tonsillectomy   . Adenoidectomy     History reviewed. No pertinent family history.  History  Substance Use Topics  . Smoking status: Current Every Day Smoker  . Smokeless tobacco: Never Used  . Alcohol Use: No     Comment: quit 2 years ago      Review of Systems  Constitutional: Negative for fever.  Respiratory: Negative for shortness of breath.   Cardiovascular: Negative for chest pain.  Gastrointestinal: Negative for nausea, vomiting, abdominal pain and diarrhea.  Neurological: Positive for tremors. Negative for speech difficulty and headaches.  All other systems reviewed and are negative.    Allergies  Hydrocodone-acetaminophen  Home Medications   Current Outpatient Rx  Name  Route  Sig  Dispense  Refill  . ALBUTEROL SULFATE HFA 108 (90 BASE) MCG/ACT IN AERS   Inhalation   Inhale 2 puffs into the lungs every 6 (six) hours as needed.         . OXYCODONE-ACETAMINOPHEN 10-325 MG PO TABS   Oral   Take 1 tablet by mouth every 6 (six) hours as needed. For pain         . TIOTROPIUM BROMIDE MONOHYDRATE 18 MCG IN CAPS   Inhalation   Place 18 mcg into inhaler and inhale daily.            BP 121/81  Pulse 93  Temp 97.7 F (36.5 C) (Oral)  Resp 18  SpO2 97%  Physical Exam  Nursing note and vitals reviewed. Constitutional: He is oriented to person, place, and time. He appears well-developed and well-nourished. No distress.  HENT:  Head: Normocephalic.  Eyes: Conjunctivae normal and EOM are normal. Pupils are equal, round, and reactive to light.  Neck: Normal range of motion.  Cardiovascular: Normal rate.   Pulmonary/Chest: Effort normal and breath sounds normal. No stridor. No respiratory distress. He has no wheezes. He has no rales. He exhibits no tenderness.  Abdominal: Soft. Bowel sounds are normal.  Musculoskeletal: Normal range of motion.  Neurological: He is alert and oriented to person, place, and time.       Resting tremor, no pill rolling.   Cranial nerves III through XII intact, strength 5 out of 5x4 extremities, negative pronator drift, finger to nose and heel-to-shin coordinated, sensation intact to pinprick and light touch, gait is ataxic and Romberg is negative.   Proprioception intact  Psychiatric: He has a normal mood and affect.    ED Course  Procedures (including critical care time)  Labs Reviewed  CBC WITH DIFFERENTIAL - Abnormal; Notable for the following:    Hemoglobin 17.4 (*)     All other components within normal limits  COMPREHENSIVE METABOLIC PANEL   Ct Head Wo Contrast  08/06/2012  *RADIOLOGY  REPORT*  Clinical Data: Right-sided shaking x 6 months  CT HEAD WITHOUT CONTRAST  Technique:  Contiguous axial images were obtained from the base of the skull through the vertex without contrast.  Comparison: None.  Findings: No evidence of parenchymal hemorrhage or extra-axial fluid collection. No mass lesion, mass effect, or midline shift.  No CT evidence of acute infarction. 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 A demyelinating disorder such as MS is considered less likely.  Global cortical atrophy.  No ventriculomegaly.  Visualized paranasal sinuses are essentially clear.  Mild partial opacification of the bilateral mastoid air cells.  No evidence of calvarial fracture.  IMPRESSION: No evidence of acute intracranial abnormality.  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  Periventricular and subcortical white matter hypodensities, favored to reflect small vessel ischemic changes.   Original Report Authenticated By: Charline Bills, M.D.      1. Gait abnormality   2. Occasional tremors       MDM   Neurology consult from Dr. Lu Duffel appreciated: Based on the CT finding an increasing difficulty with gait he recommends the patient be admitted and neuro will consult.  Patient will be admitted to family practice service resident Dr. Adriana Simas and neuro Dr. Dia Crawford Trinidi Toppins, PA-C 08/06/12 [phone]

## 2012-08-06 NOTE — ED Notes (Signed)
Pt also reports numbness to bilateral hands x 3 years. Seen by Dr. Mikeal Hawthorne. Grips equal and strong.

## 2012-08-07 LAB — RPR: RPR Ser Ql: NONREACTIVE

## 2012-08-07 LAB — C-REACTIVE PROTEIN: CRP: 1.5 mg/dL — ABNORMAL HIGH (ref <0.60)

## 2012-08-07 NOTE — ED Provider Notes (Signed)
Medical screening examination/treatment/procedure(s) were performed by non-physician practitioner and as supervising physician I was immediately available for consultation/collaboration.   Telissa Palmisano H Yusuf Yu, MD 08/07/12 0700 

## 2012-08-07 NOTE — Consult Note (Signed)
Case discussed with Dr Clinton Sawyer, who evaluated the patient in the ED and spoke with neurology service about the plan and disposition of this patient.   Paula Compton, MD

## 2012-08-08 ENCOUNTER — Telehealth: Payer: Self-pay | Admitting: Family Medicine

## 2012-08-08 ENCOUNTER — Encounter: Payer: Self-pay | Admitting: Family Medicine

## 2012-08-08 NOTE — Telephone Encounter (Signed)
Called patient to let him know that HIV, RPR, and ESR were normal. I will send him a letter with the results that he can take to his PCP. He is agreeable with this.

## 2013-01-07 ENCOUNTER — Encounter: Payer: Self-pay | Admitting: Neurology

## 2013-01-08 ENCOUNTER — Encounter: Payer: Self-pay | Admitting: Neurology

## 2013-01-08 ENCOUNTER — Ambulatory Visit (INDEPENDENT_AMBULATORY_CARE_PROVIDER_SITE_OTHER): Payer: Medicaid Other | Admitting: Neurology

## 2013-01-08 VITALS — BP 115/84 | HR 91 | Ht 66.0 in | Wt 187.0 lb

## 2013-01-08 DIAGNOSIS — M6281 Muscle weakness (generalized): Secondary | ICD-10-CM

## 2013-01-08 DIAGNOSIS — R269 Unspecified abnormalities of gait and mobility: Secondary | ICD-10-CM

## 2013-01-08 DIAGNOSIS — R531 Weakness: Secondary | ICD-10-CM | POA: Insufficient documentation

## 2013-01-08 NOTE — Progress Notes (Signed)
History of present illness:  Dale Edwards is a 57 years old right-handed Caucasian male, accompanied by his wife, referred by his primary care physician Dr. Rosetta Edwards for evaluation of right-sided difficulty  He had a past medical history of chronic low back pain, right leg pain, knee pain, COPD, has been on disability,  He noticed several year history of right hand shaking, especially when he trying to use his right hand, such as holding a utensil, he has to hold his spoon like a child to prevent the food from spitting out, progressively worse over the past one year, he also noticed right neck shaking, especially when he bearing weight, mild gait difficulty dragging his right leg, he suffered a sudden fell to his right neck in 2007, with right tibial fracture,  He has chronic low back pain, but denies significant shooting pain from back to his lower extremity, he denies bilateral extremity paresthesia, he denies bowel and bladder incontinence, he denies visual loss,  Review of Systems  Out of a complete 14 system review, the patient complains of only the following symptoms, and all other reviewed systems are negative.   Constitutional:   N/A Cardiovascular:  N/A Ear/Nose/Throat:  Hearing loss Skin:  itching Eyes: N/A Respiratory: Shortness of breath, cough, sneezing, Gastroitestinal: N/A    Hematology/Lymphatic:  N/A Endocrine:  N/A Musculoskeletal:N/A Allergy/Immunology: Achy muscles Neurological: Numbness, weakness, tremor, snoring, restless leg, Psychiatric:     decreased energy  PHYSICAL EXAMINATOINS:  Generalized: In no acute distress  Neck: Supple, no carotid bruits   Cardiac: Regular rate rhythm  Pulmonary: Clear to auscultation bilaterally  Musculoskeletal: No deformity  Neurological examination  Mentation: Alert oriented to time, place, history taking, and causual conversation  Cranial nerve II-XII: Pupils were equal round reactive to light extraocular movements were full,  visual field were full on confrontational test. facial sensation and strength were normal. hearing was intact to finger rubbing bilaterally. Uvula tongue midline.  head turning and shoulder shrug and were normal and symmetric.Tongue protrusion into cheek strength was normal.  Motor: right hand fixation on rapid rotation movement, mild rigidity, no significant right leg weakness   Sensory: Intact to fine touch, pinprick, preserved vibratory sensation, and proprioception at toes.  Coordination: Normal finger to nose, heel-to-shin bilaterally there was no truncal ataxia  Gait: Rising up from seated position, dragging right leg, mildly unsteady  Romberg signs: Negative  Deep tendon reflexes: Brachioradialis 2+/2, biceps 2+/2, triceps 2+/2, patellar 3/2, Achilles 2/1, plantar responses were flexor bilaterally.  Assessement and Plan: 57 years old right-handed Caucasian male, was 4 years history of gradual onset right-sided difficulty, he was found to have hyperreflexia at the right upper and lower extremity, mild weakness involving right upper extremity,  1. Indicating left hemisphere pathology, differentiation diagnosis including degenerative disorder, such as cortical basal ganglion degeneration, 2 MRI of the brain 3 laboratory evaluation 4 physical therapy 5 return to clinic in one month

## 2013-01-09 LAB — LIPID PANEL
Cholesterol, Total: 231 mg/dL — ABNORMAL HIGH (ref 100–199)
HDL: 32 mg/dL — ABNORMAL LOW (ref >39)
VLDL Cholesterol Cal: 53 mg/dL — ABNORMAL HIGH (ref 5–40)

## 2013-01-09 LAB — TSH: TSH: 1.89 u[IU]/mL (ref 0.450–4.500)

## 2013-01-09 LAB — CK: Total CK: 42 U/L (ref 24–204)

## 2013-01-09 LAB — ANA: Anti Nuclear Antibody(ANA): NEGATIVE

## 2013-01-11 NOTE — Progress Notes (Signed)
Quick Note:  lab showed elevated cholesterol 230s, triglycerides 267, LDL 146. Will dicuss treatment on follow up visit. ______

## 2013-01-19 ENCOUNTER — Telehealth: Payer: Self-pay | Admitting: Neurology

## 2013-01-19 NOTE — Telephone Encounter (Signed)
I called and spoke with patient's spouse to inform him that he is to have the MRI Brain which was approved and if something is seen on that scan then [employer] may approve the scan that was denied per [facility].

## 2013-01-19 NOTE — Telephone Encounter (Signed)
I spoke with patient to inform him that I will speak with the MRI dept. concerning his denial for MRI scan.

## 2013-01-22 ENCOUNTER — Ambulatory Visit
Admission: RE | Admit: 2013-01-22 | Discharge: 2013-01-22 | Disposition: A | Discharge status: Home / Self Care | Payer: Medicaid Other | Source: Ambulatory Visit | Attending: Neurology | Admitting: Neurology

## 2013-01-22 DIAGNOSIS — R531 Weakness: Secondary | ICD-10-CM

## 2013-01-22 DIAGNOSIS — M6281 Muscle weakness (generalized): Secondary | ICD-10-CM

## 2013-01-22 DIAGNOSIS — R269 Unspecified abnormalities of gait and mobility: Secondary | ICD-10-CM

## 2013-01-25 NOTE — Progress Notes (Signed)
Quick Note:  Marked abnormal MRI brain and cervical, will discuss findings on his follow up in July 15th. ______

## 2013-02-09 ENCOUNTER — Ambulatory Visit (INDEPENDENT_AMBULATORY_CARE_PROVIDER_SITE_OTHER): Payer: Medicaid Other | Admitting: Neurology

## 2013-02-09 ENCOUNTER — Encounter: Payer: Self-pay | Admitting: Neurology

## 2013-02-09 VITALS — BP 124/76 | HR 71 | Ht 65.0 in | Wt 189.0 lb

## 2013-02-09 DIAGNOSIS — R269 Unspecified abnormalities of gait and mobility: Secondary | ICD-10-CM

## 2013-02-09 DIAGNOSIS — M6281 Muscle weakness (generalized): Secondary | ICD-10-CM

## 2013-02-09 DIAGNOSIS — G35 Multiple sclerosis: Secondary | ICD-10-CM

## 2013-02-09 DIAGNOSIS — R531 Weakness: Secondary | ICD-10-CM

## 2013-02-09 NOTE — Progress Notes (Signed)
History of present illness:  Dale Edwards is a 57 years old right-handed Caucasian male, accompanied by his wife, referred by his primary care physician Dr. Rosetta Posner for evaluation of right-sided difficulty  He had a past medical history of chronic low back pain, right leg pain, knee pain, COPD, has been on disability since March 2014 due to physical difficulty, right side difficulty  He noticed  4 years history of right hand difficulty since 2010, he complains of right hand shaking, especially when he trying to use his right hand, such as holding a utensil, he has to hold his spoon like a child to prevent the food from spitting out, progressively worse over the past one year, he also noticed right leg shaking, especially when he bearing weight since 2013, mild gait difficulty dragging his right leg, he suffered a sudden fell to his right neck in 2007, with right tibial fracture,  He has chronic low back pain, but denies significant shooting pain from back to his lower extremity, he denies bilateral extremity paresthesia, he denies bowel and bladder incontinence, he denies visual loss,  UPDATE July 15th 2014:  He complains of low back pain, both knee pain, mild blurry vision,  continued gait difficulty. MRI brain showed mild-moderate periventricular and subcortical round and ovoid T2 hyperintensities, some of which are confluent.  MRI cervical spine At C2-C3, there is posterior central T2 hyperintense spinal cord lesion.  Above findings are consistent with multiple sclerosis.    He has no clear clinical relapsing and remitting, over the past few years, continue gradually worsening gait difficulty especially the right arm and the right leg difficulty.  Laboratory evaluation showed normal or negative vitamin B12, RPR, folic acid, ANA, HIV, C. reactive protein, TSH, CPK,.   Review of Systems  Out of a complete 14 system review, the patient complains of only the following symptoms, and all other reviewed  systems are negative.   Constitutional:   N/A Cardiovascular:  N/A Ear/Nose/Throat:  Hearing loss Skin:  itching Eyes: N/A Respiratory: Shortness of breath, cough, sneezing, Gastroitestinal: N/A    Hematology/Lymphatic:  N/A Endocrine:  N/A Musculoskeletal:N/A Allergy/Immunology: Achy muscles Neurological: Numbness, weakness, tremor, snoring, restless leg, Psychiatric:     decreased energy  PHYSICAL EXAMINATOINS:  Generalized: In no acute distress  Neck: Supple, no carotid bruits   Cardiac: Regular rate rhythm  Pulmonary: Clear to auscultation bilaterally  Musculoskeletal: No deformity  Neurological examination  Mentation: Alert oriented to time, place, history taking, and causual conversation  Cranial nerve II-XII: Pupils were equal round reactive to light extraocular movements were full, visual field were full on confrontational test. facial sensation and strength were normal. hearing was intact to finger rubbing bilaterally. Uvula tongue midline.  head turning and shoulder shrug and were normal and symmetric.Tongue protrusion into cheek strength was normal.  Motor: right hand fixation on rapid rotation movement, mild rigidity, mild right hip flexion weakness  Sensory: Intact to fine touch, pinprick, preserved vibratory sensation, and proprioception at toes.  Coordination: Normal finger to nose, heel-to-shin bilaterally there was no truncal ataxia  Gait: Rising up from seated position, dragging right leg, stiff, wide-based unsteady gait  Romberg signs: Negative  Deep tendon reflexes: Brachioradialis 2+/2, biceps 2+/2, triceps 2+/2, patellar 3/2, Achilles 2/1, plantar responses were flexor bilaterally.  Assessement and Plan: 57 years old right-handed Caucasian male, was 4 years history of gradual onset right-sided difficulty, he was found to have hyperreflexia at the right upper and lower extremity, mild weakness involving right upper extremity,  1. MRI of brain and  cervical findings are most consistent with relapsing remitting multiple sclerosis. 2. laboratory evaluation to rule out mimics 3. LP for CSF study.   4. RTC in one month

## 2013-02-10 ENCOUNTER — Other Ambulatory Visit: Payer: Self-pay | Admitting: Neurology

## 2013-02-10 DIAGNOSIS — G35 Multiple sclerosis: Secondary | ICD-10-CM

## 2013-02-10 DIAGNOSIS — R269 Unspecified abnormalities of gait and mobility: Secondary | ICD-10-CM

## 2013-02-10 DIAGNOSIS — R531 Weakness: Secondary | ICD-10-CM

## 2013-02-10 LAB — IFE AND PE, SERUM
Albumin/Glob SerPl: 1.4 (ref 0.7–2.0)
Alpha2 Glob SerPl Elph-Mcnc: 0.8 g/dL (ref 0.4–1.2)
B-Globulin SerPl Elph-Mcnc: 1 g/dL (ref 0.6–1.3)
Globulin, Total: 2.9 g/dL (ref 2.0–4.5)
IgG (Immunoglobin G), Serum: 909 mg/dL (ref 700–1600)
M Protein SerPl Elph-Mcnc: 0.1 g/dL — ABNORMAL HIGH
Total Protein: 6.8 g/dL (ref 6.0–8.5)

## 2013-02-10 LAB — ANGIOTENSIN CONVERTING ENZYME: Angio Convert Enzyme: 53 U/L (ref 14–82)

## 2013-02-10 LAB — LYME, TOTAL AB TEST/REFLEX: Lyme Ab: <0.91 index (ref 0.00–0.90)

## 2013-02-10 LAB — HEPATITIS PANEL, ACUTE
Hep C Virus Ab: <0.1 s/co ratio (ref 0.0–0.9)
Hepatitis B Surface Ag: NEGATIVE

## 2013-02-11 NOTE — Progress Notes (Signed)
Quick Note:  Spoke to patient with normal lab results, per Dr. Terrace Arabia. ______

## 2013-02-18 ENCOUNTER — Other Ambulatory Visit: Payer: Medicaid Other

## 2013-02-25 ENCOUNTER — Ambulatory Visit
Admission: RE | Admit: 2013-02-25 | Discharge: 2013-02-25 | Disposition: A | Discharge status: Home / Self Care | Payer: Medicaid Other | Source: Ambulatory Visit | Attending: Neurology | Admitting: Neurology

## 2013-02-25 VITALS — BP 109/70 | HR 72

## 2013-02-25 DIAGNOSIS — R531 Weakness: Secondary | ICD-10-CM

## 2013-02-25 DIAGNOSIS — G35 Multiple sclerosis: Secondary | ICD-10-CM

## 2013-02-25 DIAGNOSIS — R269 Unspecified abnormalities of gait and mobility: Secondary | ICD-10-CM

## 2013-02-25 NOTE — Progress Notes (Addendum)
Blood drawn from left AC to go with spinal fluid that will be drawn to day. Discharge instructions explained to pt.

## 2013-02-25 NOTE — Progress Notes (Signed)
Pt had completed the LP and is doing well post procedure.

## 2013-02-26 LAB — CSF PANEL 1
Glucose, CSF: 56 mg/dL (ref 43–76)
RBC Count, CSF: 0 cu mm
WBC, CSF: 1 cu mm (ref 0–5)

## 2013-02-26 LAB — GRAM STAIN
Gram Stain: NONE SEEN
Gram Stain: NONE SEEN

## 2013-03-02 ENCOUNTER — Telehealth: Payer: Self-pay | Admitting: Neurology

## 2013-03-03 LAB — MULTIPLE SCLEROSIS PANEL 2
Albumin CSF: 25 mg/dL (ref <35)
IgM MSPROF: <0.001 mg/dL (ref <0.010)
IgM-CSF: 0.05 mg/dL (ref <0.10)
Myelin basic protein, csf: <2 mcg/L (ref <4.1)

## 2013-03-05 NOTE — Telephone Encounter (Signed)
Spoke to patient and Steward Drone. Patient is scheduled for MRI on 03/08/13. Received letter from [employer] additional information needed from Dr. Terrace Arabia for approval. Advised forwarded information to MRI coordinator Luster Landsberg), and she will handle from this point. Both agreed.

## 2013-03-08 ENCOUNTER — Ambulatory Visit
Admission: RE | Admit: 2013-03-08 | Discharge: 2013-03-08 | Disposition: A | Discharge status: Home / Self Care | Payer: Medicaid Other | Source: Ambulatory Visit | Attending: Neurology | Admitting: Neurology

## 2013-03-08 DIAGNOSIS — R269 Unspecified abnormalities of gait and mobility: Secondary | ICD-10-CM

## 2013-03-08 DIAGNOSIS — G35 Multiple sclerosis: Secondary | ICD-10-CM

## 2013-03-08 DIAGNOSIS — R531 Weakness: Secondary | ICD-10-CM

## 2013-03-08 MED ORDER — GADOBENATE DIMEGLUMINE 529 MG/ML IV SOLN
17.0000 mL | Freq: Once | INTRAVENOUS | Status: AC | PRN
  Administered 2013-03-08: 17 mL via INTRAVENOUS

## 2013-03-17 NOTE — Progress Notes (Signed)
Quick Note:  Will discussion findings on follow up visit in August 28th ______

## 2013-03-17 NOTE — Progress Notes (Signed)
Quick Note:  Findings c/w with MS will follow up in August 28th ______

## 2013-03-25 ENCOUNTER — Encounter: Payer: Self-pay | Admitting: Neurology

## 2013-03-25 ENCOUNTER — Ambulatory Visit (INDEPENDENT_AMBULATORY_CARE_PROVIDER_SITE_OTHER): Payer: Medicaid Other | Admitting: Neurology

## 2013-03-25 VITALS — BP 126/86 | HR 87 | Ht 65.0 in | Wt 190.0 lb

## 2013-03-25 DIAGNOSIS — G35 Multiple sclerosis: Secondary | ICD-10-CM

## 2013-03-25 NOTE — Progress Notes (Signed)
History of present illness:  Dale Edwards is a 57 years old right-handed Caucasian male, accompanied by his wife, referred by his primary care physician Dr. Rosetta Posner for evaluation of right-sided difficulty  He had a past medical history of chronic low back pain, right leg pain, knee pain, COPD, has been on disability since March 2014 due to physical difficulty, right side difficulty  He noticed  4 years history of right hand difficulty since 2010, he complains of right hand shaking, especially when he trying to use his right hand, such as holding a utensil, he has to hold his spoon like a child to prevent the food from spitting out, progressively worse over the past one year, he also noticed right leg shaking, especially when he bearing weight since 2013, mild gait difficulty dragging his right leg, he suffered a sudden fell to his right neck in 2007, with right tibial fracture,  He has chronic low back pain, but denies significant shooting pain from back to his lower extremity, he denies bilateral extremity paresthesia, he denies bowel and bladder incontinence, he denies visual loss,  He complains of low back pain, both knee pain, mild blurry vision,  continued gait difficulty.  MRI brain showed mild-moderate periventricular and subcortical round and ovoid T2 hyperintensities, some of which are confluent.  MRI cervical spine At C2-C3, there is posterior central T2 hyperintense spinal cord lesion.  Above findings are consistent with multiple sclerosis.    He has no clear clinical relapsing and remitting, over the past few years, continue gradually worsening gait difficulty especially the right arm and the right leg difficulty.  Laboratory evaluation showed normal or negative vitamin B12, RPR, folic acid, ANA, HIV, C. reactive protein, TSH, CPK,.   UPDATE August 28th 2014:  MRI of the brain and cervical spine with contrast did not show contrast enhancement, laboratory showed normal Lyme titer, ACE level,  negative hepatitis panel,  Spinal fluid testing showed WBC 1, glucose 44, total protein of 56, more than 5 oligoclonal banding, and increased IgG synthetic rate, increased IgG index,  He continued to have mild gait difficulty, dragging his right leg,  PMHX:  COPD   Review of Systems  Out of a complete 14 system review, the patient complains of only the following symptoms, and all other reviewed systems are negative.   Constitutional:   N/A Cardiovascular:  N/A Ear/Nose/Throat:  Hearing loss Skin:  itching Eyes: N/A Respiratory: Shortness of breath, cough, sneezing, Gastroitestinal: N/A    Hematology/Lymphatic:  N/A Endocrine:  N/A Musculoskeletal:N/A Allergy/Immunology: Achy muscles Neurological: Numbness, weakness, tremor, snoring, restless leg, Psychiatric:     decreased energy  PHYSICAL EXAMINATOINS:  Generalized: In no acute distress  Neck: Supple, no carotid bruits   Cardiac: Regular rate rhythm  Pulmonary: Clear to auscultation bilaterally  Musculoskeletal: No deformity  Neurological examination  Mentation: Alert oriented to time, place, history taking, and causual conversation  Cranial nerve II-XII: Pupils were equal round reactive to light extraocular movements were full, visual field were full on confrontational test. facial sensation and strength were normal. hearing was intact to finger rubbing bilaterally. Uvula tongue midline.  head turning and shoulder shrug and were normal and symmetric.Tongue protrusion into cheek strength was normal.  Motor: right hand fixation on rapid rotation movement, mild rigidity, mild right hip flexion weakness  Sensory: Intact to fine touch, pinprick, preserved vibratory sensation, and proprioception at toes.  Coordination: Normal finger to nose, heel-to-shin bilaterally there was no truncal ataxia  Gait: Rising up from seated position,  dragging right leg, stiff, wide-based unsteady gait, dragging right leg,  Romberg  signs: Negative  Deep tendon reflexes: Brachioradialis 2+/2, biceps 2+/2, triceps 2+/2, patellar 3/2, Achilles 2/1, plantar responses were flexor bilaterally.  Assessement and Plan: 57 years old right-handed Caucasian male, with 4 years history of gradual onset right-sided difficulty since 2010, he was found to have hyperreflexia at the right upper and lower extremity, mild weakness involving right upper extremity,  1. MRI of brain and cervical findings are most consistent with relapsing remitting multiple sclerosis, which is confirmed by abnormal CSF study was 5 oligoclonal banding, elevated IgG index, synthetic rate, 2. I have discussed this treatment option with him, he decided on tablet, does not want to do self injections, I will start Tecfidera bid, side effect explained 3 return to clinic was [facility] in 3 months.

## 2013-03-26 LAB — FUNGUS CULTURE W SMEAR: Smear Result: NONE SEEN

## 2013-04-01 ENCOUNTER — Telehealth: Payer: Self-pay

## 2013-04-01 NOTE — Telephone Encounter (Signed)
Christy from [employer] called, asked that we fax allergy and med list to them at [phone].  All info requested has been sent.

## 2013-04-02 ENCOUNTER — Telehealth: Payer: Self-pay

## 2013-04-02 NOTE — Telephone Encounter (Signed)
[facility] called stating a prior Berkley Harvey is required for Tecfidera.  I have submitted all required info to ins, pending response.

## 2013-06-30 ENCOUNTER — Encounter: Payer: Self-pay | Admitting: Nurse Practitioner

## 2013-06-30 ENCOUNTER — Encounter (INDEPENDENT_AMBULATORY_CARE_PROVIDER_SITE_OTHER): Payer: Self-pay

## 2013-06-30 ENCOUNTER — Ambulatory Visit (INDEPENDENT_AMBULATORY_CARE_PROVIDER_SITE_OTHER): Payer: Medicare Other | Admitting: Nurse Practitioner

## 2013-06-30 VITALS — BP 118/81 | HR 83 | Ht 67.0 in | Wt 196.0 lb

## 2013-06-30 DIAGNOSIS — R269 Unspecified abnormalities of gait and mobility: Secondary | ICD-10-CM

## 2013-06-30 DIAGNOSIS — Z79899 Other long term (current) drug therapy: Secondary | ICD-10-CM

## 2013-06-30 DIAGNOSIS — G35 Multiple sclerosis: Secondary | ICD-10-CM

## 2013-06-30 NOTE — Progress Notes (Signed)
[facility]  PATIENT: Dale Edwards DOB: 16-Jun-1956   REASON FOR VISIT: follow up for MS   HISTORY OF PRESENT ILLNESS:Mr Pfost, 57 year old male returns for followup. He has a history of multiple sclerosis recently diagnosed. He was last seen in the  office 03/25/2013. He was started on tecfidera at that time. He denies any side effects to the drug, he has not had any flushing. He denies any falls, he continues to have mild gait difficulty. He denies any loss of bowel or bladder control he has a history of chronic back pain and has been on disability for that.   HISTORY: He had a past medical history of chronic low back pain, right leg pain, knee pain, COPD, has been on disability since March 2014 due to physical difficulty, right side difficulty  He noticed 4 years history of right hand difficulty since 2010, he complains of right hand shaking, especially when he trying to use his right hand, such as holding a utensil, he has to hold his spoon like a child to prevent the food from spitting out, progressively worse over the past one year, he also noticed right leg shaking, especially when he bearing weight since 2013, mild gait difficulty dragging his right leg, he suffered a sudden fell to his right neck in 2007, with right tibial fracture,  He has chronic low back pain, but denies significant shooting pain from back to his lower extremity, he denies bilateral extremity paresthesia, he denies bowel and bladder incontinence, he denies visual loss,  He complains of low back pain, both knee pain, mild blurry vision, continued gait difficulty.  MRI brain showed mild-moderate periventricular and subcortical round and ovoid T2 hyperintensities, some of which are confluent. MRI cervical spine At C2-C3, there is posterior central T2 hyperintense spinal cord lesion.  Above findings are consistent with multiple sclerosis.  He has no clear clinical relapsing and remitting, over  the past few years, continue gradually worsening gait difficulty especially the right arm and the right leg difficulty.  Laboratory evaluation showed normal or negative vitamin B12, RPR, folic acid, ANA, HIV, C. reactive protein, TSH, CPK,.  UPDATE August 28th 2014:  MRI of the brain and cervical spine with contrast did not show contrast enhancement, laboratory showed normal Lyme titer, ACE level, negative hepatitis panel,  Spinal fluid testing showed WBC 1, glucose 44, total protein of 56, more than 5 oligoclonal banding, and increased IgG synthetic rate, increased IgG index,  He continued to have mild gait difficulty, dragging his right leg,   REVIEW OF SYSTEMS: Full 14 system review of systems performed and notable only for:  Constitutional: N/A  Cardiovascular: N/A  Ear/Nose/Throat: N/A  Skin: N/A  Eyes: N/A  Respiratory: shortness of breath Gastroitestinal: N/A  Hematology/Lymphatic: N/A  Endocrine: N/A Musculoskeletal:joint pain Allergy/Immunology: N/A  Neurological: N/A Psychiatric: N/A   ALLERGIES: Allergies  Allergen Reactions  . Hydrocodone-Acetaminophen Itching and Nausea Only         HOME MEDICATIONS: Outpatient Prescriptions Prior to Visit  Medication Sig Dispense Refill  . albuterol (PROVENTIL HFA;VENTOLIN HFA) 108 (90 BASE) MCG/ACT inhaler Inhale 2 puffs into the lungs every 6 (six) hours as needed.      . cyclobenzaprine (FLEXERIL) 10 MG tablet Take 10 mg by mouth 2 (two) times daily.      . diazepam (VALIUM) 10 MG tablet Take 10 mg by mouth 3 (three) times daily.      Marland Kitchen oxyCODONE-acetaminophen (PERCOCET) 10-325 MG per tablet Take 1 tablet  by mouth every 6 (six) hours as needed. For pain      . tiotropium (SPIRIVA) 18 MCG inhalation capsule Place 18 mcg into inhaler and inhale daily.       No facility-administered medications prior to visit.    PAST MEDICAL HISTORY: Past Medical History  Diagnosis Date  . Back pain   . COPD (chronic obstructive pulmonary  disease)   . Tremors of nervous system     Right  . Multiple sclerosis     PAST SURGICAL HISTORY: Past Surgical History  Procedure Laterality Date  . Tonsillectomy    . Adenoidectomy    . Broken leg      FAMILY HISTORY: Family History  Problem Relation Age of Onset  . COPD      SOCIAL HISTORY: History   Social History  . Marital Status: Single    Spouse Name: N/A    Number of Children: 0  . Years of Education: GED   Occupational History  .      Disabled   Social History Main Topics  . Smoking status: Current Every Day Smoker -- 0.50 packs/day    Types: Cigarettes  . Smokeless tobacco: Never Used     Comment: Half Pack  . Alcohol Use: 0.5 oz/week    1 drink(s) per week     Comment: quit 2 years ago..   pt is now drinking on occ - very rare maybe once a month.  . Drug Use: No  . Sexual Activity: Not on file   Other Topics Concern  . Not on file   Social History Narrative   Patient lives at home with Rogene Houston.   Patient does not have a significant other.    Patient is disabled.   Patient has no children.    Patient has his GED   Right handed.   Caffeine - four glasses and some sodas     PHYSICAL EXAM  Filed Vitals:   06/30/13 1509  BP: 118/81  Pulse: 83  Height: 5\' 7"  (1.702 m)  Weight: 196 lb (88.905 kg)   Body mass index is 30.69 kg/(m^2).  Generalized: Well developed, in no acute distress  Head: normocephalic and atraumatic,. Oropharynx benign  Neck: Supple, no carotid bruits  Cardiac: Regular rate rhythm, no murmur  Musculoskeletal: No deformity   Neurological examination   Mentation: Alert oriented to time, place, history taking. Follows all commands speech and language fluent  Cranial nerve II-XII: Pupils were equal round reactive to light extraocular movements were full, visual field were full on confrontational test. Facial sensation and strength were normal. hearing was intact to finger rubbing bilaterally. Uvula tongue  midline. head turning and shoulder shrug and were normal and symmetric.Tongue protrusion into cheek strength was normal. Motor: normal bulk and tone, full strength in the BUE, BLE, except right hip flexion weakness Sensory: normal and symmetric to light touch, pinprick, and  vibration  Coordination: finger-nose-finger, heel-to-shin bilaterally, no dysmetria Reflexes: Brachioradialis 2/2, biceps 2/2, triceps 2/2, patellar 3/2, Achilles 1/1, plantar responses were flexor bilaterally. Gait and Station: Rising up from seated position without assistance, wide based stance,  moderate stride, dragging right leg, can heel and toe walk, unsteady with tandem. No assistive device    DIAGNOSTIC DATA (LABS, IMAGING, TESTING) - I reviewed patient records, labs, notes, testing and imaging myself where available.  Lab Results  Component Value Date   WBC 7.4 08/06/2012   HGB 17.4* 08/06/2012   HCT 49.5 08/06/2012   MCV 92.4  08/06/2012   PLT 235 08/06/2012      Component Value Date/Time   NA 136 08/06/2012 1239   K 4.0 08/06/2012 1239   CL 100 08/06/2012 1239   CO2 27 08/06/2012 1239   GLUCOSE 94 08/06/2012 1239   BUN 7 08/06/2012 1239   CREATININE 0.90 08/06/2012 1239   CALCIUM 8.7 08/06/2012 1239   PROT 6.8 02/09/2013 1554   PROT 6.7 08/06/2012 1239   ALBUMIN 3.5 08/06/2012 1239   AST 19 08/06/2012 1239   ALT 19 08/06/2012 1239   ALKPHOS 69 08/06/2012 1239   BILITOT 1.2 08/06/2012 1239   GFRNONAA >90 08/06/2012 1239   GFRAA >90 08/06/2012 1239   Lab Results  Component Value Date   HDL 32* 01/08/2013   LDLCALC 146* 01/08/2013   TRIG 267* 01/08/2013   CHOLHDL 7.2* 01/08/2013   No results found for this basename: HGBA1C   Lab Results  Component Value Date   VITAMINB12 228 01/08/2013   Lab Results  Component Value Date   TSH 1.890 01/08/2013      ASSESSMENT AND PLAN  57 y.o. year old male  has a past medical history of Back pain; COPD (chronic obstructive pulmonary disease); Tremors of nervous system; and Multiple sclerosis.  here to followup. Recently placed on tecfidera and denies side effects to the drug.   Continue tecfidera to 240 mg twice daily Will check labs today, CMP Followup in 6 months Nilda Riggs, [facility], [facility], APRN  [facility] [address], [address] [place], [place] [address] [phone]

## 2013-06-30 NOTE — Patient Instructions (Signed)
Continue tecfidera to 240 mg twice daily Will check labs today Followup in 6 months

## 2013-07-01 LAB — COMPREHENSIVE METABOLIC PANEL
ALT: 29 IU/L (ref 0–44)
AST: 23 IU/L (ref 0–40)
Albumin/Globulin Ratio: 1.7 (ref 1.1–2.5)
BUN/Creatinine Ratio: 13 (ref 9–20)
BUN: 11 mg/dL (ref 6–24)
CO2: 26 mmol/L (ref 18–29)
Creatinine, Ser: 0.84 mg/dL (ref 0.76–1.27)
Total Bilirubin: 1.3 mg/dL — ABNORMAL HIGH (ref 0.0–1.2)

## 2013-07-01 NOTE — Progress Notes (Signed)
Quick Note:  Shared lab results findings per Ms Daphine Deutscher, thru VM message ______

## 2013-07-16 ENCOUNTER — Telehealth: Payer: Self-pay | Admitting: Neurology

## 2013-07-16 NOTE — Telephone Encounter (Signed)
Please advise 

## 2013-07-16 NOTE — Telephone Encounter (Signed)
I called the caregiver back. She said he has new ins and it says Tecfidera is not covered on the ins.  She said [employer] is sending them a temporary supply and they are faxing forms for [place] to complete a prior auth.  I advised we will complete the forms, once received, and will send them back to ins.  Explained we than will have top wait for ins to respond.  She verbalized understanding.  They will have enough meds to last for at least two weeks.

## 2013-07-20 ENCOUNTER — Telehealth: Payer: Self-pay | Admitting: Neurology

## 2013-07-20 NOTE — Telephone Encounter (Signed)
I already spoke to the caregiver regarding this on Friday.  I called the patient back, got no answer.  Left message.  Patient is being shipped 2 weeks worth of meds at no charge from manufacturer while we are waiting on the prior auth response from ins.

## 2013-07-23 ENCOUNTER — Telehealth: Payer: Self-pay | Admitting: Neurology

## 2013-07-23 ENCOUNTER — Telehealth: Payer: Self-pay

## 2013-07-23 NOTE — Telephone Encounter (Signed)
[employer] sent [place] a letter stating they have approved our request for coverage on Tecfidera effective until 07/28/2014  Ref # [id number] CMS Approved  Member ID # [id number]

## 2013-07-23 NOTE — Telephone Encounter (Signed)
This was already approved.  Please see prior note.  I called and spoke with Steward Drone.  She is aware.

## 2013-12-30 ENCOUNTER — Ambulatory Visit: Payer: Medicare Other | Admitting: Nurse Practitioner

## 2014-01-19 ENCOUNTER — Encounter: Payer: Self-pay | Admitting: Nurse Practitioner

## 2014-01-19 ENCOUNTER — Encounter (INDEPENDENT_AMBULATORY_CARE_PROVIDER_SITE_OTHER): Payer: Self-pay

## 2014-01-19 ENCOUNTER — Ambulatory Visit (INDEPENDENT_AMBULATORY_CARE_PROVIDER_SITE_OTHER): Payer: Medicare Other | Admitting: Nurse Practitioner

## 2014-01-19 VITALS — BP 120/80 | HR 85 | Ht 67.0 in | Wt 182.0 lb

## 2014-01-19 DIAGNOSIS — Z5181 Encounter for therapeutic drug level monitoring: Secondary | ICD-10-CM

## 2014-01-19 DIAGNOSIS — R269 Unspecified abnormalities of gait and mobility: Secondary | ICD-10-CM

## 2014-01-19 DIAGNOSIS — G35 Multiple sclerosis: Secondary | ICD-10-CM

## 2014-01-19 NOTE — Patient Instructions (Signed)
Will repeat MRI of the brain Continue tecfidera twice daily Labs today Followup in 6 months, next appointment  with Dr. Terrace ArabiaYan

## 2014-01-19 NOTE — Progress Notes (Signed)
[facility]  PATIENT: Dale Edwards DOB: [birth year]   REASON FOR VISIT: Followup for multiple sclerosis   HISTORY OF PRESENT ILLNESS: Dale Edwards, 58 year old male returns for followup.He has a history of multiple sclerosis  diagnosed in August of last year. He was last seen in the office 06/30/2013. He was started on tecfidera in August  2014.  He denies any side effects to the drug, he has not had any flushing. He denies any falls, he continues to have mild gait difficulty. He denies any loss of bowel or bladder control he has a history of chronic back pain and has been on disability for that.   HISTORY: He had a past medical history of chronic low back pain, right leg pain, knee pain, COPD, has been on disability since March 2014 due to physical difficulty, right side difficulty  He noticed 4 years history of right hand difficulty since 2010, he complains of right hand shaking, especially when he trying to use his right hand, such as holding a utensil, he has to hold his spoon like a child to prevent the food from spitting out, progressively worse over the past one year, he also noticed right leg shaking, especially when he bearing weight since 2013, mild gait difficulty dragging his right leg, he suffered a sudden fell to his right neck in 2007, with right tibial fracture,  He has chronic low back pain, but denies significant shooting pain from back to his lower extremity, he denies bilateral extremity paresthesia, he denies bowel and bladder incontinence, he denies visual loss,  He complains of low back pain, both knee pain, mild blurry vision, continued gait difficulty.  MRI brain showed mild-moderate periventricular and subcortical round and ovoid T2 hyperintensities, some of which are confluent. MRI cervical spine At C2-C3, there is posterior central T2 hyperintense spinal cord lesion.  Above findings are consistent with multiple sclerosis.  He has no clear clinical  relapsing and remitting, over the past few years, continue gradually worsening gait difficulty especially the right arm and the right leg difficulty.  Laboratory evaluation showed normal or negative vitamin B12, RPR, folic acid, ANA, HIV, C. reactive protein, TSH, CPK,.  UPDATE August 28th 2014:  MRI of the brain and cervical spine with contrast did not show contrast enhancement, laboratory showed normal Lyme titer, ACE level, negative hepatitis panel,  Spinal fluid testing showed WBC 1, glucose 44, total protein of 56, more than 5 oligoclonal banding, and increased IgG synthetic rate, increased IgG index,  He continued to have mild gait difficulty, dragging his right leg,   REVIEW OF SYSTEMS: Full 14 system review of systems performed and notable only for those listed, all others are neg:  Constitutional: N/A  Cardiovascular: N/A  Ear/Nose/Throat:  hearing loss  Skin: N/A  Eyes: N/A  Respiratory:  COPD  Gastroitestinal: N/A  Hematology/Lymphatic: N/A  Endocrine: N/A Musculoskeletal: chronic back pain  Allergy/Immunology: N/A  Neurological:  weakness, tremors  Psychiatric: N/A Sleep : NA   ALLERGIES: Allergies  Allergen Reactions  . Hydrocodone-Acetaminophen Itching and Nausea Only         HOME MEDICATIONS: Outpatient Prescriptions Prior to Visit  Medication Sig Dispense Refill  . albuterol (PROVENTIL HFA;VENTOLIN HFA) 108 (90 BASE) MCG/ACT inhaler Inhale 2 puffs into the lungs every 6 (six) hours as needed.      . diazepam (VALIUM) 10 MG tablet Take 10 mg by mouth 3 (three) times daily.      . Dimethyl Fumarate (TECFIDERA) 240 MG CPDR  Take 240 mg by mouth 2 (two) times daily.      Marland Kitchen oxyCODONE-acetaminophen (PERCOCET) 10-325 MG per tablet Take 1 tablet by mouth every 6 (six) hours as needed. For pain      . tiotropium (SPIRIVA) 18 MCG inhalation capsule Place 18 mcg into inhaler and inhale daily.      . cyclobenzaprine (FLEXERIL) 10 MG tablet Take 10 mg by mouth 2 (two) times  daily.       No facility-administered medications prior to visit.    PAST MEDICAL HISTORY: Past Medical History  Diagnosis Date  . Back pain   . COPD (chronic obstructive pulmonary disease)   . Tremors of nervous system     Right  . Multiple sclerosis     PAST SURGICAL HISTORY: Past Surgical History  Procedure Laterality Date  . Tonsillectomy    . Adenoidectomy    . Broken leg      FAMILY HISTORY: Family History  Problem Relation Age of Onset  . Heart Problems Mother     SOCIAL HISTORY: History   Social History  . Marital Status: Single    Spouse Name: N/A    Number of Children: 0  . Years of Education: GED   Occupational History  .      Disabled   Social History Main Topics  . Smoking status: Current Every Day Smoker -- 0.50 packs/day    Types: Cigarettes  . Smokeless tobacco: Never Used     Comment: Half Pack  . Alcohol Use: 0.5 oz/week    1 drink(s) per week     Comment: quit 2 years ago..   pt is now drinking on occ - very rare maybe once a month.  . Drug Use: No  . Sexual Activity: Not on file   Other Topics Concern  . Not on file   Social History Narrative   Patient lives at home with Rogene Houston.   Patient does not have a significant other.    Patient is disabled.   Patient has no children.    Patient has his GED   Right handed.   Caffeine - four glasses and some sodas     PHYSICAL EXAM  Filed Vitals:   01/19/14 1530  BP: 120/80  Pulse: 85  Height: 5\' 7"  (1.702 m)  Weight: 182 lb (82.555 kg)   Body mass index is 28.5 kg/(m^2). Generalized: Well developed, in no acute distress  Head: normocephalic and atraumatic,. Oropharynx benign  Neck: Supple, no carotid bruits  Cardiac: Regular rate rhythm, no murmur  Musculoskeletal: No deformity  Neurological examination  Mentation: Alert oriented to time, place, history taking. Follows all commands speech and language fluent  Cranial nerve II-XII: Pupils were equal round reactive to  light extraocular movements were full, visual field were full on confrontational test. Facial sensation and strength were normal. hearing was intact to finger rubbing bilaterally. Uvula tongue midline. head turning and shoulder shrug and were normal and symmetric.Tongue protrusion into cheek strength was normal.  Motor: normal bulk and tone, full strength in the BUE, BLE, except right hip flexion weakness ,  outstretch tremor right greater than left, no cogwheeling . Sensory: normal and symmetric to light touch, pinprick, and vibration  Coordination: finger-nose-finger, heel-to-shin bilaterally, no dysmetria  Reflexes: Brachioradialis 2/2, biceps 2/2, triceps 2/2, patellar 3/2, Achilles 1/1, plantar responses were flexor bilaterally.  Gait and Station: Rising up from seated position without assistance, wide based stance, moderate stride, dragging right leg, can heel and toe  walk, unsteady with tandem. No assistive device   DIAGNOSTIC DATA (LABS, IMAGING, TESTING) -  ASSESSMENT AND PLAN  58 y.o. year old male  has a past medical history of Back pain; COPD (chronic obstructive pulmonary disease); Tremors of nervous system; and Multiple sclerosis. here  to followup.   Will repeat MRI of the brain Continue tecfidera twice daily Labs today Followup in 6 months, next appointment  with Dr. Carmelina NounYan Nancy Carolyn Martin, [facility], [facility], APRN  [facility] [address], [address] [place], [place] [address] [phone]

## 2014-01-20 LAB — CBC WITH DIFFERENTIAL/PLATELET
BASOS: 1 %
Basophils Absolute: 0.1 10*3/uL (ref 0.0–0.2)
EOS ABS: 0.1 10*3/uL (ref 0.0–0.4)
Eos: 2 %
HEMATOCRIT: 54.3 % — AB (ref 37.5–51.0)
HEMOGLOBIN: 18.7 g/dL — AB (ref 12.6–17.7)
Immature Grans (Abs): 0 10*3/uL (ref 0.0–0.1)
Immature Granulocytes: 0 %
LYMPHS ABS: 0.9 10*3/uL (ref 0.7–3.1)
Lymphs: 21 %
MCH: 35.1 pg — AB (ref 26.6–33.0)
MCHC: 34.4 g/dL (ref 31.5–35.7)
MCV: 102 fL — AB (ref 79–97)
MONOS ABS: 0.5 10*3/uL (ref 0.1–0.9)
Monocytes: 11 %
NEUTROS ABS: 2.8 10*3/uL (ref 1.4–7.0)
Neutrophils Relative %: 65 %
RBC: 5.33 x10E6/uL (ref 4.14–5.80)
RDW: 14.3 % (ref 12.3–15.4)
WBC: 4.4 10*3/uL (ref 3.4–10.8)

## 2014-01-20 LAB — COMPREHENSIVE METABOLIC PANEL
A/G RATIO: 2 (ref 1.1–2.5)
ALT: 72 IU/L — AB (ref 0–44)
AST: 49 IU/L — AB (ref 0–40)
Albumin: 4.5 g/dL (ref 3.5–5.5)
Alkaline Phosphatase: 71 IU/L (ref 39–117)
BILIRUBIN TOTAL: 1.2 mg/dL (ref 0.0–1.2)
BUN/Creatinine Ratio: 11 (ref 9–20)
BUN: 8 mg/dL (ref 6–24)
CO2: 24 mmol/L (ref 18–29)
CREATININE: 0.73 mg/dL — AB (ref 0.76–1.27)
Calcium: 9 mg/dL (ref 8.7–10.2)
Chloride: 100 mmol/L (ref 97–108)
GFR, EST AFRICAN AMERICAN: 121 mL/min/{1.73_m2} (ref >59)
GFR, EST NON AFRICAN AMERICAN: 104 mL/min/{1.73_m2} (ref >59)
GLOBULIN, TOTAL: 2.3 g/dL (ref 1.5–4.5)
Glucose: 95 mg/dL (ref 65–99)
POTASSIUM: 4.5 mmol/L (ref 3.5–5.2)
SODIUM: 139 mmol/L (ref 134–144)
Total Protein: 6.8 g/dL (ref 6.0–8.5)

## 2014-01-20 NOTE — Progress Notes (Signed)
Quick Note:  I called and gave results to caregiver. Pt asleep. She was with pt at appt yesterday. I relayed that labs looked good per Darrol Angelarolyn Martin, NP. MRI authorization to be done prior to scheduling could take up to a week or so. She verbalized understanding. ______

## 2014-02-01 ENCOUNTER — Other Ambulatory Visit: Payer: Medicaid Other

## 2014-02-09 ENCOUNTER — Ambulatory Visit
Admission: RE | Admit: 2014-02-09 | Discharge: 2014-02-09 | Disposition: A | Discharge status: Home / Self Care | Payer: Medicare Other | Source: Ambulatory Visit | Attending: Nurse Practitioner | Admitting: Nurse Practitioner

## 2014-02-09 DIAGNOSIS — Z5181 Encounter for therapeutic drug level monitoring: Secondary | ICD-10-CM

## 2014-02-09 DIAGNOSIS — G35 Multiple sclerosis: Secondary | ICD-10-CM

## 2014-02-09 DIAGNOSIS — R269 Unspecified abnormalities of gait and mobility: Secondary | ICD-10-CM

## 2014-02-09 MED ORDER — GADOBENATE DIMEGLUMINE 529 MG/ML IV SOLN
17.0000 mL | Freq: Once | INTRAVENOUS | Status: AC | PRN
  Administered 2014-02-09: 17 mL via INTRAVENOUS

## 2014-02-14 NOTE — Progress Notes (Signed)
Quick Note:  I called and spoke to pt and gave him the results of the MRI brain (stable from 12/2012). He asked about lab results. I relayed that on 01-20-14 results relayed to caregiver, that labs looked good. He verbalized understanding. ______

## 2014-03-08 ENCOUNTER — Other Ambulatory Visit: Payer: Self-pay

## 2014-03-08 MED ORDER — DIMETHYL FUMARATE 240 MG PO CPDR: 240.0000 mg | DELAYED_RELEASE_CAPSULE | Freq: Two times a day (BID) | ORAL | Status: DC

## 2014-03-24 ENCOUNTER — Ambulatory Visit
Admission: RE | Admit: 2014-03-24 | Discharge: 2014-03-24 | Disposition: A | Discharge status: Home / Self Care | Payer: Medicare Other | Source: Ambulatory Visit | Attending: Internal Medicine | Admitting: Internal Medicine

## 2014-03-24 ENCOUNTER — Other Ambulatory Visit: Payer: Self-pay | Admitting: Internal Medicine

## 2014-03-24 DIAGNOSIS — F17200 Nicotine dependence, unspecified, uncomplicated: Secondary | ICD-10-CM

## 2014-03-24 DIAGNOSIS — J449 Chronic obstructive pulmonary disease, unspecified: Secondary | ICD-10-CM

## 2014-03-25 ENCOUNTER — Encounter (HOSPITAL_COMMUNITY): Payer: Self-pay | Admitting: Emergency Medicine

## 2014-03-25 ENCOUNTER — Emergency Department (HOSPITAL_COMMUNITY)
Admission: EM | Admit: 2014-03-25 | Discharge: 2014-03-26 | Disposition: A | Discharge status: Home / Self Care | Payer: Medicare Other | Attending: Emergency Medicine | Admitting: Emergency Medicine

## 2014-03-25 ENCOUNTER — Emergency Department (HOSPITAL_COMMUNITY): Payer: Medicare Other

## 2014-03-25 DIAGNOSIS — R079 Chest pain, unspecified: Secondary | ICD-10-CM | POA: Insufficient documentation

## 2014-03-25 DIAGNOSIS — F17200 Nicotine dependence, unspecified, uncomplicated: Secondary | ICD-10-CM | POA: Diagnosis not present

## 2014-03-25 DIAGNOSIS — R071 Chest pain on breathing: Secondary | ICD-10-CM | POA: Insufficient documentation

## 2014-03-25 DIAGNOSIS — J4489 Other specified chronic obstructive pulmonary disease: Secondary | ICD-10-CM | POA: Insufficient documentation

## 2014-03-25 DIAGNOSIS — R0789 Other chest pain: Secondary | ICD-10-CM

## 2014-03-25 DIAGNOSIS — R059 Cough, unspecified: Secondary | ICD-10-CM | POA: Diagnosis not present

## 2014-03-25 DIAGNOSIS — R05 Cough: Secondary | ICD-10-CM | POA: Diagnosis not present

## 2014-03-25 DIAGNOSIS — J449 Chronic obstructive pulmonary disease, unspecified: Secondary | ICD-10-CM | POA: Diagnosis not present

## 2014-03-25 DIAGNOSIS — G35 Multiple sclerosis: Secondary | ICD-10-CM | POA: Insufficient documentation

## 2014-03-25 DIAGNOSIS — Z79899 Other long term (current) drug therapy: Secondary | ICD-10-CM | POA: Diagnosis not present

## 2014-03-25 LAB — CBC
HEMATOCRIT: 52.5 % — AB (ref 39.0–52.0)
Hemoglobin: 18.6 g/dL — ABNORMAL HIGH (ref 13.0–17.0)
MCH: 36 pg — AB (ref 26.0–34.0)
MCHC: 35.4 g/dL (ref 30.0–36.0)
MCV: 101.7 fL — ABNORMAL HIGH (ref 78.0–100.0)
Platelets: 194 10*3/uL (ref 150–400)
RBC: 5.16 MIL/uL (ref 4.22–5.81)
RDW: 13.4 % (ref 11.5–15.5)
WBC: 6.8 10*3/uL (ref 4.0–10.5)

## 2014-03-25 LAB — BASIC METABOLIC PANEL
Anion gap: 11 (ref 5–15)
BUN: 9 mg/dL (ref 6–23)
CALCIUM: 8.9 mg/dL (ref 8.4–10.5)
CO2: 26 mEq/L (ref 19–32)
Chloride: 98 mEq/L (ref 96–112)
Creatinine, Ser: 0.8 mg/dL (ref 0.50–1.35)
Glucose, Bld: 99 mg/dL (ref 70–99)
POTASSIUM: 5.1 meq/L (ref 3.7–5.3)
Sodium: 135 mEq/L — ABNORMAL LOW (ref 137–147)

## 2014-03-25 LAB — I-STAT TROPONIN, ED: Troponin i, poc: 0 ng/mL (ref 0.00–0.08)

## 2014-03-25 NOTE — ED Notes (Addendum)
Presents with 3-4 days of left sided chest pain worse with cough and deep breath began after a coughing spell. Pt had x ray at [facility] radiology yesterday but has not heard results. CP is associated with generalized fatigue and generalized weakness. Denies nausea, and SOB that is worse than normal.   Left sided chest is constant but worse with cough. Splinting chest makes pain better

## 2014-03-26 ENCOUNTER — Emergency Department (HOSPITAL_COMMUNITY): Payer: Medicare Other

## 2014-03-26 ENCOUNTER — Encounter (HOSPITAL_COMMUNITY): Payer: Self-pay

## 2014-03-26 DIAGNOSIS — R071 Chest pain on breathing: Secondary | ICD-10-CM | POA: Diagnosis not present

## 2014-03-26 MED ORDER — OXYCODONE-ACETAMINOPHEN 5-325 MG PO TABS: 1.0000 | ORAL_TABLET | Freq: Four times a day (QID) | ORAL | Status: DC | PRN

## 2014-03-26 MED ORDER — ONDANSETRON HCL 4 MG/2ML IJ SOLN
4.0000 mg | Freq: Once | INTRAMUSCULAR | Status: AC
  Administered 2014-03-26: 4 mg via INTRAVENOUS
  Filled 2014-03-26: qty 2

## 2014-03-26 MED ORDER — MORPHINE SULFATE 4 MG/ML IJ SOLN
4.0000 mg | Freq: Once | INTRAMUSCULAR | Status: AC
  Administered 2014-03-26: 4 mg via INTRAVENOUS
  Filled 2014-03-26: qty 1

## 2014-03-26 MED ORDER — IOHEXOL 350 MG/ML SOLN
100.0000 mL | Freq: Once | INTRAVENOUS | Status: AC | PRN
  Administered 2014-03-26: 100 mL via INTRAVENOUS

## 2014-03-26 NOTE — ED Provider Notes (Signed)
CSN: [id number]     Arrival date & time 03/25/14  2003 History   First MD Initiated Contact with Patient 03/26/14 0001     Chief Complaint  Patient presents with  . Chest Pain     (Consider location/radiation/quality/duration/timing/severity/associated sxs/prior Treatment) Patient is a 58 y.o. male presenting with chest pain. The history is provided by the patient.  Chest Pain Pain location:  L chest Associated symptoms: cough   Associated symptoms: no abdominal pain, no back pain, no headache, no nausea, no numbness, no shortness of breath, not vomiting and no weakness    patient pain to his anterior lower left chest. Worse with movement. Worse with palpation. States it began a couple days ago. He states the day before that he had a coughing episode. No fevers. No hemoptysis. No swelling of his legs. He smokes about a pack a day. No nausea. No diaphoresis.  Past Medical History  Diagnosis Date  . Back pain   . COPD (chronic obstructive pulmonary disease)   . Tremors of nervous system     Right  . Multiple sclerosis    Past Surgical History  Procedure Laterality Date  . Tonsillectomy    . Adenoidectomy    . Broken leg     Family History  Problem Relation Age of Onset  . Heart Problems Mother    History  Substance Use Topics  . Smoking status: Current Every Day Smoker -- 0.50 packs/day    Types: Cigarettes  . Smokeless tobacco: Never Used     Comment: Half Pack  . Alcohol Use: 0.5 oz/week    1 drink(s) per week     Comment: quit 2 years ago..   pt is now drinking on occ - very rare maybe once a month.    Review of Systems  Constitutional: Negative for activity change and appetite change.  Eyes: Negative for pain.  Respiratory: Positive for cough. Negative for chest tightness and shortness of breath.   Cardiovascular: Positive for chest pain. Negative for leg swelling.  Gastrointestinal: Negative for nausea, vomiting, abdominal pain and diarrhea.  Genitourinary:  Negative for flank pain.  Musculoskeletal: Negative for back pain and neck stiffness.  Skin: Negative for rash.  Neurological: Negative for weakness, numbness and headaches.  Psychiatric/Behavioral: Negative for behavioral problems.      Allergies  Hydrocodone-acetaminophen  Home Medications   Prior to Admission medications   Medication Sig Start Date End Date Taking? Authorizing Provider  albuterol (PROVENTIL HFA;VENTOLIN HFA) 108 (90 BASE) MCG/ACT inhaler Inhale 2 puffs into the lungs every 6 (six) hours as needed.   Yes Historical Provider, MD  diazepam (VALIUM) 10 MG tablet Take 10 mg by mouth 3 (three) times daily.   Yes Historical Provider, MD  Dimethyl Fumarate 240 MG CPDR Take 240 mg by mouth 2 (two) times daily. 03/08/14  Yes Levert Feinstein, MD  oxyCODONE-acetaminophen (PERCOCET) 10-325 MG per tablet Take 1 tablet by mouth every 6 (six) hours as needed. For pain   Yes Historical Provider, MD  tiotropium (SPIRIVA) 18 MCG inhalation capsule Place 18 mcg into inhaler and inhale daily.   Yes Historical Provider, MD  oxyCODONE-acetaminophen (PERCOCET/ROXICET) 5-325 MG per tablet Take 1-2 tablets by mouth every 6 (six) hours as needed for severe pain. 03/26/14   Juliet Rude. Jhalil Silvera, MD   BP 118/79  Pulse 95  Temp(Src) 98 F (36.7 C) (Oral)  Resp 12  Ht 5' 6.5" (1.689 m)  Wt 181 lb (82.101 kg)  BMI 28.78 kg/m2  SpO2 97% Physical Exam  Nursing note and vitals reviewed. Constitutional: He is oriented to person, place, and time. He appears well-developed and well-nourished.  HENT:  Head: Normocephalic and atraumatic.  Eyes: EOM are normal. Pupils are equal, round, and reactive to light.  Neck: Normal range of motion. Neck supple.  Cardiovascular: Normal rate, regular rhythm and normal heart sounds.   No murmur heard. Pulmonary/Chest: Effort normal. He exhibits tenderness.  Few scattered wheezes. Moderate tenderness to anterior left lower chest. No crepitance or deformity. No rash.   Abdominal: Soft. Bowel sounds are normal. He exhibits no distension and no mass. There is no tenderness. There is no rebound and no guarding.  Musculoskeletal: Normal range of motion. He exhibits no edema.  Neurological: He is alert and oriented to person, place, and time. No cranial nerve deficit.  Skin: Skin is warm and dry.  Psychiatric: He has a normal mood and affect.    ED Course  Procedures (including critical care time) Labs Review Labs Reviewed  CBC - Abnormal; Notable for the following:    Hemoglobin 18.6 (*)    HCT 52.5 (*)    MCV 101.7 (*)    MCH 36.0 (*)    All other components within normal limits  BASIC METABOLIC PANEL - Abnormal; Notable for the following:    Sodium 135 (*)    All other components within normal limits  I-STAT TROPOININ, ED    Imaging Review Dg Chest 2 View  03/25/2014   CLINICAL DATA:  Cough and chest pain.  EXAM: CHEST  2 VIEW  COMPARISON:  Two-view chest x-ray 03/24/2014. CT of the chest 12/29/2008.  FINDINGS: The heart is mildly enlarged. Chronic bilateral lower lobe scarring is again seen. No definite superimposed disease is evident. The visualized soft tissues and bony thorax are unremarkable.  IMPRESSION: 1. Stable chronic bilateral lower lobe scarring and volume loss. 2. Stable cardiomegaly without definite failure. 3. Chronic interstitial lung disease.   Electronically Signed   By: Gennette Pac M.D.   On: 03/25/2014 22:28   Dg Chest 2 View  03/24/2014   CLINICAL DATA:  Cough.  EXAM: CHEST  2 VIEW  COMPARISON:  CT 12/28/2008.  Chest x-ray 12/29/2008, 10/17/2008  FINDINGS: Mediastinum and hilar structures normal. Stable cardiomegaly. Normal pulmonary vascularity. Prominent pleural parenchymal thickening is noted bilaterally consistent with scarring. No new infiltrate. No pleural effusion or pneumothorax. No acute bony abnormality .  IMPRESSION: 1. Stable bilateral severe pleural-parenchymal scarring. 2. Stable cardiomegaly. No evidence of overt  congestive heart failure. No new focal pulmonary infiltrate.   Electronically Signed   By: Maisie Fus  Register   On: 03/24/2014 16:12   Ct Angio Chest W/cm &/or Wo Cm  03/26/2014   CLINICAL DATA:  Left-sided chest pain. Pain worse with cough and inspiration.  EXAM: CT ANGIOGRAPHY CHEST WITH CONTRAST  TECHNIQUE: Multidetector CT imaging of the chest was performed using the standard protocol during bolus administration of intravenous contrast. Multiplanar CT image reconstructions and MIPs were obtained to evaluate the vascular anatomy.  CONTRAST:  OMNIPAQUE IOHEXOL 350 MG/ML SOLN  COMPARISON:  Two-view chest x-ray 03/25/2014 and 03/24/2014. CT chest with contrast 12/29/2008  FINDINGS: Pulmonary arterial opacification is excellent. No focal filling defects are present to suggest pulmonary emboli.  The heart size is normal. No significant pericardial effusion is present. There is no significant mediastinal or axillary adenopathy.  Chronic scarring is again seen in the right greater than left lower lobes. Diffuse pleural thickening is similar to the  prior study as well. Mild diffuse ground-glass attenuation likely reflects edema. No new airspace consolidation is evident.  Bone windows are unremarkable.  Review of the MIP images confirms the above findings.  IMPRESSION: 1. Chronic scarring and pleural thickening in the lungs bilaterally, right greater than left. 2. Increased diffuse ground-glass attenuation suggesting mild edema without additional focal airspace disease. 3. No evidence for pulmonary emboli. 4. Borderline cardiomegaly.   Electronically Signed   By: Gennette Pac M.D.   On: 03/26/2014 01:37     EKG Interpretation   Date/Time:  Friday March 25 2014 20:21:44 EDT Ventricular Rate:  87 PR Interval:  132 QRS Duration: 72 QT Interval:  372 QTC Calculation: 447 R Axis:   83 Text Interpretation:  Normal sinus rhythm Cannot rule out Anterior infarct  , age undetermined Abnormal ECG Confirmed by  Rubin Payor  MD, Morton Simson [phone])  on 03/26/2014 12:12:38 AM      MDM   Final diagnoses:  Chest wall pain    Patient chest pain. Somewhat pleuritic. Began today after coughing. He is a smoker. EKG reassuring. CT angiography done and does not show pulmonary embolism. Will discharge home. Likely chest wall pain. No rash to identify a zoster    Juliet Rude. Rubin Payor, MD 03/26/14 6045

## 2014-03-26 NOTE — Discharge Instructions (Signed)

## 2014-03-26 NOTE — ED Notes (Addendum)
Pt states that he is having left sided chest/rib-cage pain x 3 days. Pain is reproduce able with palpation. Pt states his chest only hurts when he coughs. Pt states that the pain started after a bad coughing spell.  Rubin Payor, MD at bedside.

## 2014-03-26 NOTE — ED Notes (Signed)
Declines w/c, steady gait

## 2014-06-07 ENCOUNTER — Encounter: Payer: Self-pay | Admitting: Neurology

## 2014-06-07 ENCOUNTER — Telehealth: Payer: Self-pay | Admitting: Neurology

## 2014-06-07 NOTE — Telephone Encounter (Signed)
Called patient and left voicemail message on mobile with rescheduled appointment from 12/22 @ 2:30 to 08/01/14 @ 12:30.  Mailed letter with new date and time.

## 2014-07-19 ENCOUNTER — Ambulatory Visit: Payer: Medicare Other | Admitting: Neurology

## 2014-08-01 ENCOUNTER — Encounter: Payer: Self-pay | Admitting: Neurology

## 2014-08-01 ENCOUNTER — Ambulatory Visit (INDEPENDENT_AMBULATORY_CARE_PROVIDER_SITE_OTHER): Payer: Medicare Other | Admitting: Neurology

## 2014-08-01 VITALS — BP 116/81 | HR 68 | Ht 67.0 in | Wt 174.0 lb

## 2014-08-01 DIAGNOSIS — M5442 Lumbago with sciatica, left side: Secondary | ICD-10-CM

## 2014-08-01 DIAGNOSIS — M545 Low back pain, unspecified: Secondary | ICD-10-CM | POA: Insufficient documentation

## 2014-08-01 DIAGNOSIS — G35 Multiple sclerosis: Secondary | ICD-10-CM

## 2014-08-01 DIAGNOSIS — R269 Unspecified abnormalities of gait and mobility: Secondary | ICD-10-CM

## 2014-08-01 NOTE — Progress Notes (Addendum)
[facility]  PATIENT: Dale Edwards DOB: 1956-03-13   REASON FOR VISIT: Followup for multiple sclerosis   HISTORY OF PRESENT ILLNESS:   He noticed 4 years history of right hand difficulty since 2010, he complains of right hand shaking, especially when he trying to use his right hand, such as holding a utensil, he has to hold his spoon like a child to prevent the food from spitting out, progressively worse over the past one year, he also noticed right leg shaking, especially when he bearing weight since 2013, mild gait difficulty dragging his right leg, he suffered a sudden fell to his right neck in 2007, with right tibial fracture,  He has chronic low back pain, but denies significant shooting pain from back to his lower extremity, he denies bilateral extremity paresthesia, he denies bowel and bladder   MRI brain showed mild-moderate periventricular and subcortical round and ovoid T2 hyperintensities, some of which are confluent.  MRI cervical spine At C2-C3, there is posterior central T2 hyperintense spinal cord lesion.  Above findings are consistent with multiple sclerosis.  He has no clear clinical relapsing and remitting, over the past few years, continue gradually worsening gait difficulty especially the right arm and the right leg difficulty.  Laboratory evaluation showed normal or negative vitamin B12, RPR, folic acid, ANA, HIV, C. reactive protein, TSH, CPK, Normal or negative Lyme titer, ACE level,   hepatitis panel,  Spinal fluid testing showed WBC 1, glucose 44, total protein of 56, more than 5 oligoclonal banding, and increased IgG synthetic rate, increased IgG index,   He had a past medical history of chronic low back pain, right leg pain, knee pain, COPD, has been on disability since March 2014 due to physical difficulty, right side difficulty   He was started on tecfidera in August  2014.  He denies any side effects to the drug, he has not had any  flushing. He denies any falls, he continues to have mild gait difficulty. He denies any loss of bowel or bladder control he has a history of chronic back pain and has been on disability for that.   UPDATE Jan 4th 2016: He tolerate the Tecfidera twice a day, today his main complaint is worsening low back pain, radiating pain to his left lower extremity, worsening gait difficulty, he fell occasionally Recent MRI brain July 2015 at [facility] imaging 1. Moderate periventricular and subcortical chronic demyelinating plaques. Some of these are confluent. Some are hypointense on T1 views. No acute plaques. 2. Moderate perisylvian and corpus callosal atrophy. 3. Compared to MRI from 01/22/13 there is no change.  JC virus was positive with titer of 0.8 4 in August 01 2014  REVIEW OF SYSTEMS: Full 14 system review of systems performed and notable only for those listed, all others are neg:  Hearing loss, shortness of breath, frequent wakening, back pain, walking difficulty, numbness, tremor   ALLERGIES: Allergies  Allergen Reactions  . Hydrocodone-Acetaminophen Itching and Nausea Only         HOME MEDICATIONS: Outpatient Prescriptions Prior to Visit  Medication Sig Dispense Refill  . albuterol (PROVENTIL HFA;VENTOLIN HFA) 108 (90 BASE) MCG/ACT inhaler Inhale 2 puffs into the lungs every 6 (six) hours as needed.    . diazepam (VALIUM) 10 MG tablet Take 10 mg by mouth 3 (three) times daily.    . Dimethyl Fumarate 240 MG CPDR Take 240 mg by mouth 2 (two) times daily.    Marland Kitchen oxyCODONE-acetaminophen (PERCOCET) 10-325 MG per tablet Take  1 tablet by mouth every 6 (six) hours as needed. For pain    . oxyCODONE-acetaminophen (PERCOCET/ROXICET) 5-325 MG per tablet Take 1-2 tablets by mouth every 6 (six) hours as needed for severe pain. 10 tablet 0  . tiotropium (SPIRIVA) 18 MCG inhalation capsule Place 18 mcg into inhaler and inhale daily.     No facility-administered medications prior to visit.    PAST  MEDICAL HISTORY: Past Medical History  Diagnosis Date  . Back pain   . COPD (chronic obstructive pulmonary disease)   . Tremors of nervous system     Right  . Multiple sclerosis     PAST SURGICAL HISTORY: Past Surgical History  Procedure Laterality Date  . Tonsillectomy    . Adenoidectomy    . Broken leg      FAMILY HISTORY: Family History  Problem Relation Age of Onset  . Heart Problems Mother     SOCIAL HISTORY: History   Social History  . Marital Status: Single    Spouse Name: N/A    Number of Children: 0  . Years of Education: GED   Occupational History  .      Disabled   Social History Main Topics  . Smoking status: Current Every Day Smoker -- 0.50 packs/day    Types: Cigarettes  . Smokeless tobacco: Never Used     Comment: Half Pack  . Alcohol Use: 0.5 oz/week    1 drink(s) per week     Comment: quit 2 years ago..   pt is now drinking on occ - very rare maybe once a month.  . Drug Use: No  . Sexual Activity: Not on file   Other Topics Concern  . Not on file   Social History Narrative   Patient lives at home with Rogene Houston.   Patient does not have a significant other.    Patient is disabled.   Patient has no children.    Patient has his GED   Right handed.   Caffeine - four glasses and some sodas     PHYSICAL EXAM  Filed Vitals:   08/01/14 1229  BP: 116/81  Pulse: 68  Height: 5\' 7"  (1.702 m)  Weight: 174 lb (78.926 kg)   Body mass index is 27.25 kg/(m^2). Generalized: Well developed, in no acute distress  Head: normocephalic and atraumatic,. Oropharynx benign  Neck: Supple, no carotid bruits  Cardiac: Regular rate rhythm, no murmur  Musculoskeletal: No deformity  Neurological examination  Mentation: Alert oriented to time, place, history taking. Follows all commands speech and language fluent  Cranial nerve II-XII: Pupils were equal round reactive to light extraocular movements were full, visual field were full on  confrontational test. Facial sensation and strength were normal. hearing was intact to finger rubbing bilaterally. Uvula tongue midline. head turning and shoulder shrug and were normal and symmetric.Tongue protrusion into cheek strength was normal.  Motor: normal bulk and tone, full strength in the BUE, BLE, except right hip flexion weakness ,  outstretch tremor right greater than left, no cogwheeling . Sensory: normal and symmetric to light touch, pinprick, and vibration  Coordination: finger-nose-finger, heel-to-shin bilaterally, no dysmetria  Reflexes: Brachioradialis 2/2, biceps 2/2, triceps 2/2, patellar 3/2, Achilles 1/1, plantar responses were flexor bilaterally.  Gait and Station: Rising up from seated position without assistance, wide based stance, moderate stride, dragging right leg, can heel and toe walk, unsteady with tandem. No assistive device   DIAGNOSTIC DATA (LABS, IMAGING, TESTING) -  ASSESSMENT AND PLAN  58  y.o. year old male with previous history of low back pain, lapsing remitting multiple sclerosis, now complains of worsening low back pain,. Radiating pain to left lower extremity  Will repeat MRI of the brain, cervical spine, lumbar spine for lumbar radiculopathy Continue tecfidera twice daily Labs today Return to clinic after MRI   Levert Feinstein, M.D.  [facility] [address] [place], [place] [address] [phone]

## 2014-08-02 ENCOUNTER — Telehealth: Payer: Self-pay | Admitting: Neurology

## 2014-08-02 LAB — COMPREHENSIVE METABOLIC PANEL
A/G RATIO: 1.8 (ref 1.1–2.5)
ALT: 28 IU/L (ref 0–44)
AST: 26 IU/L (ref 0–40)
Albumin: 4.4 g/dL (ref 3.5–5.5)
Alkaline Phosphatase: 70 IU/L (ref 39–117)
BUN / CREAT RATIO: 13 (ref 9–20)
BUN: 12 mg/dL (ref 6–24)
CO2: 26 mmol/L (ref 18–29)
CREATININE: 0.89 mg/dL (ref 0.76–1.27)
Calcium: 9.6 mg/dL (ref 8.7–10.2)
Chloride: 101 mmol/L (ref 97–108)
GFR, EST AFRICAN AMERICAN: 110 mL/min/{1.73_m2} (ref >59)
GFR, EST NON AFRICAN AMERICAN: 96 mL/min/{1.73_m2} (ref >59)
GLOBULIN, TOTAL: 2.5 g/dL (ref 1.5–4.5)
Glucose: 92 mg/dL (ref 65–99)
POTASSIUM: 4.5 mmol/L (ref 3.5–5.2)
SODIUM: 141 mmol/L (ref 134–144)
Total Bilirubin: 1.4 mg/dL — ABNORMAL HIGH (ref 0.0–1.2)
Total Protein: 6.9 g/dL (ref 6.0–8.5)

## 2014-08-02 LAB — CBC WITH DIFFERENTIAL
BASOS ABS: 0 10*3/uL (ref 0.0–0.2)
Basos: 1 %
EOS ABS: 0.1 10*3/uL (ref 0.0–0.4)
Eos: 2 %
HEMATOCRIT: 54.5 % — AB (ref 37.5–51.0)
Hemoglobin: 18.9 g/dL — ABNORMAL HIGH (ref 12.6–17.7)
IMMATURE GRANS (ABS): 0 10*3/uL (ref 0.0–0.1)
IMMATURE GRANULOCYTES: 0 %
LYMPHS: 11 %
Lymphocytes Absolute: 0.7 10*3/uL (ref 0.7–3.1)
MCH: 33.4 pg — ABNORMAL HIGH (ref 26.6–33.0)
MCHC: 34.7 g/dL (ref 31.5–35.7)
MCV: 96 fL (ref 79–97)
MONOCYTES: 11 %
Monocytes Absolute: 0.6 10*3/uL (ref 0.1–0.9)
NEUTROS PCT: 75 %
Neutrophils Absolute: 4.4 10*3/uL (ref 1.4–7.0)
PLATELETS: 235 10*3/uL (ref 150–379)
RBC: 5.66 x10E6/uL (ref 4.14–5.80)
RDW: 13.7 % (ref 12.3–15.4)
WBC: 5.8 10*3/uL (ref 3.4–10.8)

## 2014-08-02 NOTE — Telephone Encounter (Signed)
Dale Edwards, please call patient, laboratory showed normal CMP, elevated hemoglobin, which is at his baseline, keep current management as discussed during his office visit

## 2014-08-03 NOTE — Telephone Encounter (Signed)
Called patient relayed labs elevated Hemoglobin baseline. Dr.Yan will discuss  At follow up.

## 2014-08-24 ENCOUNTER — Other Ambulatory Visit: Payer: Self-pay | Admitting: Internal Medicine

## 2014-08-24 ENCOUNTER — Ambulatory Visit
Admission: RE | Admit: 2014-08-24 | Discharge: 2014-08-24 | Disposition: A | Discharge status: Home / Self Care | Payer: Medicare Other | Source: Ambulatory Visit | Attending: Internal Medicine | Admitting: Internal Medicine

## 2014-08-24 DIAGNOSIS — M25561 Pain in right knee: Secondary | ICD-10-CM

## 2014-08-24 DIAGNOSIS — M25562 Pain in left knee: Principal | ICD-10-CM

## 2014-08-26 ENCOUNTER — Other Ambulatory Visit: Payer: Self-pay

## 2014-08-26 MED ORDER — DIMETHYL FUMARATE 240 MG PO CPDR: 240.0000 mg | DELAYED_RELEASE_CAPSULE | Freq: Two times a day (BID) | ORAL | Status: DC

## 2014-08-29 ENCOUNTER — Telehealth: Payer: Self-pay | Admitting: *Deleted

## 2014-08-29 NOTE — Telephone Encounter (Signed)
Reminder enc set up for MRI due date.

## 2014-09-19 ENCOUNTER — Telehealth: Payer: Self-pay | Admitting: *Deleted

## 2014-11-14 ENCOUNTER — Telehealth: Payer: Self-pay | Admitting: *Deleted

## 2014-11-14 DIAGNOSIS — M5416 Radiculopathy, lumbar region: Secondary | ICD-10-CM

## 2014-11-14 DIAGNOSIS — G35 Multiple sclerosis: Secondary | ICD-10-CM

## 2014-11-14 NOTE — Telephone Encounter (Signed)
Reminder: Patient needs orders placed for MRI brain and MRI cervical for MS - MRI lumbar for lumbar radiculopathy.  He has a follow up appointment scheduled for 12/28/14.

## 2014-11-15 DIAGNOSIS — M5416 Radiculopathy, lumbar region: Secondary | ICD-10-CM | POA: Insufficient documentation

## 2014-11-15 NOTE — Telephone Encounter (Signed)
Left message letting him know to expect a call for MRI scheduling.

## 2014-11-15 NOTE — Addendum Note (Signed)
Addended by: Levert Feinstein on: 11/15/2014 08:43 AM   Modules accepted: Orders

## 2014-11-15 NOTE — Telephone Encounter (Signed)
Order placed for MRI brain, cervical for RRMS, also MRI lumbar for lumbar radiculopathy

## 2014-11-30 ENCOUNTER — Ambulatory Visit
Admission: RE | Admit: 2014-11-30 | Discharge: 2014-11-30 | Disposition: A | Discharge status: Home / Self Care | Payer: Medicare Other | Source: Ambulatory Visit | Attending: Neurology | Admitting: Neurology

## 2014-11-30 DIAGNOSIS — G35 Multiple sclerosis: Secondary | ICD-10-CM

## 2014-11-30 DIAGNOSIS — M5416 Radiculopathy, lumbar region: Secondary | ICD-10-CM | POA: Diagnosis not present

## 2014-12-02 ENCOUNTER — Telehealth: Payer: Self-pay | Admitting: Neurology

## 2014-12-02 NOTE — Telephone Encounter (Signed)
Spoke to [place] - he is aware of results and will keep his follow up appt.

## 2014-12-02 NOTE — Telephone Encounter (Signed)
Michelle:  Please call patient, MRI showed multilevel mild spondylitic changes, no significant canal or foraminal narrowing, no change compared to previous scan in 2012,  MRI of brain, and cervical spine continue show evidence of chronic multiple sclerosis, no significant change compared to previous scans  Keep follow-up appointment   Abnormal MRI scan of the lumbar spine showing mild spondylitic changes throughout most prominent at L3-4 where there is mild central canal and right greater than left foraminal narrowing as well as at L4-5 where there is mild (right foraminal narrowing. Overall no significant change compared with previous MRI scan dated 03/14/2011   Abnormal MRI scan of the brain showing multiple periventricular, subcortical, corpus callosal discrete and confluent white matter lesions compatible with chronic multiple sclerosis. No enhancing lesions are noted. The presence of T 1 black holes and significant atrophy of corpus callosum and cortex indicates chronic disease. Overall no significant change compared with prior MRI scan dated 02/09/2014  Abnormal MRI scan of the cervical spine showing ill-defined spinal cord hyperintensity posteriorly at C2-3 likely a chronic demyelinating plaque. No enhancing lesions are noted. At C6-7 there is diffuse disc bulging with facet hypertrophy resulting in moderate left and mild right-sided foraminal narrowing. At C3-4 there is mild right-sided foraminal narrowing. Overall no significant change compared with previous MRI scan dated 01/22/2013.

## 2014-12-02 NOTE — Telephone Encounter (Signed)
Left message to return my call - need to discuss MRI results.

## 2014-12-28 ENCOUNTER — Telehealth: Payer: Self-pay | Admitting: Neurology

## 2014-12-28 ENCOUNTER — Ambulatory Visit: Payer: Medicare Other | Admitting: Neurology

## 2014-12-28 ENCOUNTER — Encounter: Payer: Self-pay | Admitting: Neurology

## 2014-12-28 ENCOUNTER — Ambulatory Visit (INDEPENDENT_AMBULATORY_CARE_PROVIDER_SITE_OTHER): Payer: Medicare Other | Admitting: Neurology

## 2014-12-28 VITALS — BP 124/78 | HR 83 | Ht 67.0 in | Wt 181.0 lb

## 2014-12-28 DIAGNOSIS — R269 Unspecified abnormalities of gait and mobility: Secondary | ICD-10-CM | POA: Diagnosis not present

## 2014-12-28 DIAGNOSIS — G35 Multiple sclerosis: Secondary | ICD-10-CM

## 2014-12-28 DIAGNOSIS — M5416 Radiculopathy, lumbar region: Secondary | ICD-10-CM | POA: Diagnosis not present

## 2014-12-28 MED ORDER — DULOXETINE HCL 60 MG PO CPEP: 60.0000 mg | ORAL_CAPSULE | Freq: Every day | ORAL | Status: DC

## 2014-12-28 NOTE — Telephone Encounter (Signed)
Patient's caretaker/significant other is calling because she needs to have Rx DULoxetine (CYMBALTA) 60 MG capsule sent to [facility] on [facility] for the patient because the out of state pharmacy stated the Rx is not stocked there. Thank you.

## 2014-12-28 NOTE — Progress Notes (Signed)
Chief Complaint  Patient presents with  . Multiple Sclerosis    He would like to discuss his MRI results.  Feels his energy level is worse.  He is noticing tremors on his right side, especially in his hand.  The symptom is causing him difficulty when feeding himself.  . Lumbar Radiculopathy    He would like to discuss his MRI results.  Says the oxycodone seem to help lessen the severity of his pain but he has to limit his physical activities.     [facility]  PATIENT: Dale Edwards DOB: [birth year]   REASON FOR VISIT: Followup for multiple sclerosis   HISTORY OF PRESENT ILLNESS:   He noticed 4 years history of right hand difficulty since 2010, he complains of right hand shaking, especially when he trying to use his right hand, such as holding a utensil, he has to hold his spoon like a child to prevent the food from spitting out, progressively worse over the past one year, he also noticed right leg shaking, especially when he bearing weight since 2013, mild gait difficulty dragging his right leg, he suffered a sudden fell to his right neck in 2007, with right tibial fracture,  He has chronic low back pain, but denies significant shooting pain from back to his lower extremity, he denies bilateral extremity paresthesia, he denies bowel and bladder   MRI brain 2014 showed mild-moderate periventricular and subcortical round and ovoid T2 hyperintensities, some of which are confluent.  MRI cervical 2014, spine At C2-C3, there is posterior central T2 hyperintense spinal cord lesion.  Above findings are consistent with multiple sclerosis.   He has no clear clinical relapsing and remitting, over the past few years, continue gradually worsening gait difficulty especially the right arm and the right leg difficulty.  Laboratory evaluation showed normal or negative vitamin B12, RPR, folic acid, ANA, HIV, C. reactive protein, TSH, CPK, Normal or negative Lyme titer, ACE level,    hepatitis panel,  Spinal fluid testing showed WBC 1, glucose 44, total protein of 56, more than 5 oligoclonal banding, and increased IgG synthetic rate, increased IgG index,   He had a past medical history of chronic low back pain, right leg pain, knee pain, COPD, has been on disability since March 2014 due to physical difficulty, right side difficulty  He was started on tecfidera in August  2014.  He denies any side effects to the drug, he has not had any flushing. He denies any falls, he continues to have mild gait difficulty. He denies any loss of bowel or bladder control he has a history of chronic back pain and has been on disability for that.   UPDATE Jan 4th 2016: He tolerate the Tecfidera twice a day, today his main complaint is worsening low back pain, radiating pain to his left lower extremity, worsening gait difficulty, he fell occasionally  MRI brain July 2015 at [facility] imaging   1. Moderate periventricular and subcortical chronic demyelinating plaques. Some of these are confluent. Some are hypointense on T1 views. No acute plaques. 2. Moderate perisylvian and corpus callosal atrophy. 3. Compared to MRI from 01/22/13 there is no change.  JC virus was positive with titer of 0.8 4 in August 01 2014  UPDATE December 28 2014: He is with his wife at today's clinical visit, he continues to complain low back pain, going down both legs, gait difficulty, increased bilateral hands, leg tremor, right worse than left, He is taking Percocet 10/325 mg 5-6 tablets, Valium  10 mg 3 times daily from primary care Dr. Mikeal Hawthorne.  We have reviewed his MRI scans in May 2016 MRI of the brain, confluent periventricular chronic MS lesions, no contrast enhancement, no change compared to previous scan in July 2015 MRI of cervical, posterior ill-defined C2-3 noncontrast enhancing lesion, no change compared to previous scan in June 2014 MRI of lumbar, multilevel degenerative disc disease, most severe at L3-4, L4-5,  mild foraminal stenosis, no significant canal stenosis  REVIEW OF SYSTEMS: Full 14 system review of systems performed and notable only for those listed, all others are neg:  Cough, numbness, weakness, tremor, low back pain, joint pain, walking difficulty, difficulty urinate  ALLERGIES: Allergies  Allergen Reactions  . Hydrocodone-Acetaminophen Itching and Nausea Only         HOME MEDICATIONS: Outpatient Prescriptions Prior to Visit  Medication Sig Dispense Refill  . albuterol (PROVENTIL HFA;VENTOLIN HFA) 108 (90 BASE) MCG/ACT inhaler Inhale 2 puffs into the lungs every 6 (six) hours as needed.    . diazepam (VALIUM) 10 MG tablet Take 10 mg by mouth 3 (three) times daily.    . Dimethyl Fumarate 240 MG CPDR Take 1 capsule (240 mg total) by mouth 2 (two) times daily. 60 capsule 5  . oxyCODONE-acetaminophen (PERCOCET) 10-325 MG per tablet Take 1 tablet by mouth every 6 (six) hours as needed. For pain    . tiotropium (SPIRIVA) 18 MCG inhalation capsule Place 18 mcg into inhaler and inhale daily.    Marland Kitchen oxyCODONE-acetaminophen (PERCOCET/ROXICET) 5-325 MG per tablet Take 1-2 tablets by mouth every 6 (six) hours as needed for severe pain. 10 tablet 0  . naproxen (NAPROSYN) 500 MG tablet Take 500 mg by mouth 2 (two) times daily.  2   No facility-administered medications prior to visit.    PAST MEDICAL HISTORY: Past Medical History  Diagnosis Date  . Back pain   . COPD (chronic obstructive pulmonary disease)   . Tremors of nervous system     Right  . Multiple sclerosis     PAST SURGICAL HISTORY: Past Surgical History  Procedure Laterality Date  . Tonsillectomy    . Adenoidectomy    . Broken leg      FAMILY HISTORY: Family History  Problem Relation Age of Onset  . Heart Problems Mother     SOCIAL HISTORY: History   Social History  . Marital Status: Single    Spouse Name: N/A  . Number of Children: 0  . Years of Education: GED   Occupational History  .      Disabled    Social History Main Topics  . Smoking status: Current Every Day Smoker -- 0.50 packs/day    Types: Cigarettes  . Smokeless tobacco: Never Used     Comment: Half Pack  . Alcohol Use: 0.5 oz/week    1 drink(s) per week     Comment: quit 2 years ago..   pt is now drinking on occ - very rare maybe once a month.  . Drug Use: No  . Sexual Activity: Not on file   Other Topics Concern  . Not on file   Social History Narrative   Patient lives at home with Rogene Houston.   Patient does not have a significant other.    Patient is disabled.   Patient has no children.    Patient has his GED   Right handed.   Caffeine - four glasses and some sodas     PHYSICAL EXAM  Filed Vitals:   12/28/14  1107  BP: 124/78  Pulse: 83  Height: 5\' 7"  (1.702 m)  Weight: 181 lb (82.101 kg)   Body mass index is 28.34 kg/(m^2). PHYSICAL EXAMNIATION:  Gen: NAD, conversant, well nourised, obese, well groomed                     Cardiovascular: Regular rate rhythm, no peripheral edema, warm, nontender. Eyes: Conjunctivae clear without exudates or hemorrhage Neck: Supple, no carotid bruise. Pulmonary: Clear to auscultation bilaterally   NEUROLOGICAL EXAM:  MENTAL STATUS: Speech:    Speech is normal; fluent and spontaneous with normal comprehension.  Cognition:    The patient is oriented to person, place, and time;     recent and remote memory intact;     language fluent;     normal attention, concentration,     fund of knowledge.  CRANIAL NERVES: CN II: Visual fields are full to confrontation. Fundoscopic exam is normal with sharp discs and no vascular changes. Pupils were equal round reactive to light. CN III, IV, VI: extraocular movement are normal. No ptosis. CN V: Facial sensation is intact to pinprick in all 3 divisions bilaterally. Corneal responses are intact.  CN VII: Face is symmetric with normal eye closure and smile. CN VIII: Hearing is normal to rubbing fingers CN IX, X: Palate  elevates symmetrically. Phonation is normal. CN XI: Head turning and shoulder shrug are intact CN XII: Tongue is midline with normal movements and no atrophy.  MOTOR: He has normal tone, bulk, mild bilateral hip flexion weakness.  REFLEXES: Hyperreflexia on right arm, right leg, plantar responses are flexor bilaterally.  SENSORY: Length dependent decreased light touch pinprick to distal shin.   COORDINATION: He has mild dysmetric of finger to nose, heel-to-shin bilaterally, right worse than left  GAIT/STANCE: Need to push up to get up from seated position, wide-based, cautious unsteady gait   DIAGNOSTIC DATA (LABS, IMAGING, TESTING) -  ASSESSMENT AND PLAN  59 y.o. year old male with previous history of low back pain, relapsing remitting multiple sclerosis, repeat MRI in May 2016 showed chronic stable MS lesions, he has cervical spine lesions,  1, continue Tecfidera 2. Chronic low back pain, lumbar degenerative disease, no significant foraminal canal stenosis, continue Moderate exercise 3 Pain management 4. RTc in 6 months with NP  Levert Feinstein, M.D. Ph.D.  [facility] [address] [place], [place] [address] Phone: [phone] Fax:      [phone]

## 2014-12-28 NOTE — Telephone Encounter (Signed)
Rx has been resent to [facility] per patient request.   

## 2015-03-09 ENCOUNTER — Other Ambulatory Visit: Payer: Self-pay

## 2015-03-09 MED ORDER — DIMETHYL FUMARATE 240 MG PO CPDR: 240.0000 mg | DELAYED_RELEASE_CAPSULE | Freq: Two times a day (BID) | ORAL | Status: DC

## 2015-06-29 ENCOUNTER — Ambulatory Visit (INDEPENDENT_AMBULATORY_CARE_PROVIDER_SITE_OTHER): Payer: Medicare Other | Admitting: Nurse Practitioner

## 2015-06-29 ENCOUNTER — Encounter: Payer: Self-pay | Admitting: Nurse Practitioner

## 2015-06-29 ENCOUNTER — Encounter (INDEPENDENT_AMBULATORY_CARE_PROVIDER_SITE_OTHER): Payer: Self-pay

## 2015-06-29 VITALS — BP 111/72 | HR 80 | Ht 67.0 in | Wt 172.6 lb

## 2015-06-29 DIAGNOSIS — R269 Unspecified abnormalities of gait and mobility: Secondary | ICD-10-CM

## 2015-06-29 DIAGNOSIS — Z5181 Encounter for therapeutic drug level monitoring: Secondary | ICD-10-CM

## 2015-06-29 DIAGNOSIS — G35 Multiple sclerosis: Secondary | ICD-10-CM

## 2015-06-29 DIAGNOSIS — M5416 Radiculopathy, lumbar region: Secondary | ICD-10-CM | POA: Diagnosis not present

## 2015-06-29 NOTE — Progress Notes (Signed)
[facility]  PATIENT: Dale Edwards DOB: 1956/04/13   REASON FOR VISIT:  Follow-up for multiple sclerosis , abnormality of gait , lumbar radiculopathy HISTORY FROM: patient and friend    HISTORY OF PRESENT ILLNESS: HISTORYHe noticed 4 years history of right hand difficulty since 2010, he complains of right hand shaking, especially when he trying to use his right hand, such as holding a utensil, he has to hold his spoon like a child to prevent the food from spitting out, progressively worse over the past one year, he also noticed right leg shaking, especially when he bearing weight since 2013, mild gait difficulty dragging his right leg, he suffered a sudden fell to his right neck in 2007, with right tibial fracture,  He has chronic low back pain, but denies significant shooting pain from back to his lower extremity, he denies bilateral extremity paresthesia, he denies bowel and bladder MRI brain 2014 showed mild-moderate periventricular and subcortical round and ovoid T2 hyperintensities, some of which are confluent.  MRI cervical 2014, spine At C2-C3, there is posterior central T2 hyperintense spinal cord lesion.  Above findings are consistent with multiple sclerosis.  He has no clear clinical relapsing and remitting, over the past few years, continue gradually worsening gait difficulty especially the right arm and the right leg difficulty.  Laboratory evaluation showed normal or negative vitamin B12, RPR, folic acid, ANA, HIV, C. reactive protein, TSH, CPK, Normal or negative Lyme titer, ACE level, hepatitis panel,  Spinal fluid testing showed WBC 1, glucose 44, total protein of 56, more than 5 oligoclonal banding, and increased IgG synthetic rate, increased IgG index,  He had a past medical history of chronic low back pain, right leg pain, knee pain, COPD, has been on disability since March 2014 due to physical difficulty, right side difficulty  He was  started on tecfidera in August 2014. He denies any side effects to the drug, he has not had any flushing. He denies any falls, he continues to have mild gait difficulty. He denies any loss of bowel or bladder control he has a history of chronic back pain and has been on disability for that.  UPDATE Jan 4th 2016: He tolerate the Tecfidera twice a day, today his main complaint is worsening low back pain, radiating pain to his left lower extremity, worsening gait difficulty, he fell occasionally MRI brain July 2015 at [facility] imaging  1. Moderate periventricular and subcortical chronic demyelinating plaques. Some of these are confluent. Some are hypointense on T1 views. No acute plaques. 2. Moderate perisylvian and corpus callosal atrophy. 3. Compared to MRI from 01/22/13 there is no change. JC virus was positive with titer of 0.8 4 in August 01 2014 UPDATE December 28 2014: He is with his wife at today's clinical visit, he continues to complain low back pain, going down both legs, gait difficulty, increased bilateral hands, leg tremor, right worse than left, He is taking Percocet 10/325 mg 5-6 tablets, Valium 10 mg 3 times daily from primary care Dr. Mikeal Hawthorne.  We have reviewed his MRI scans in May 2016 MRI of the brain, confluent periventricular chronic MS lesions, no contrast enhancement, no change compared to previous scan in July 2015 MRI of cervical, posterior ill-defined C2-3 noncontrast enhancing lesion, no change compared to previous scan in June 2014 MRI of lumbar, multilevel degenerative disc disease, most severe at L3-4, L4-5, mild foraminal stenosis, no significant canal stenosis UPDATE 06/29/2015 Dale Edwards, 59 year old returns for follow-up. He has a history of  relapsing remitting multiple sclerosis and has been on tecfidera since August 2014.he continues to have low back pain, gait difficulty but denies any falls, ambulating with a cane today. He continues to have right hand tremor. He  continues to have questions about his MRI of the lumbar spine. He is taking Percocet for his back pain through primary care. He has not had recent labs. He returns for reevaluation   REVIEW OF SYSTEMS: Full 14 system review of systems performed and notable only for those listed, all others are neg:  Constitutional: decreased appetite Cardiovascular: neg Ear/Nose/Throat: neg  Skin: neg Eyes: neg Respiratory: COPD Gastroitestinal: difficulty urinating Hematology/Lymphatic: neg  Endocrine: neg Musculoskeletal:back pain, walking difficulty Allergy/Immunology: neg Neurological: weakness Psychiatric: neg Sleep : neg   ALLERGIES: Allergies  Allergen Reactions  . Hydrocodone-Acetaminophen Itching and Nausea Only         HOME MEDICATIONS: Outpatient Prescriptions Prior to Visit  Medication Sig Dispense Refill  . albuterol (PROVENTIL HFA;VENTOLIN HFA) 108 (90 BASE) MCG/ACT inhaler Inhale 2 puffs into the lungs every 6 (six) hours as needed.    . diazepam (VALIUM) 10 MG tablet Take 10 mg by mouth 3 (three) times daily.    . Dimethyl Fumarate 240 MG CPDR Take 1 capsule (240 mg total) by mouth 2 (two) times daily. 60 capsule 5  . DULoxetine (CYMBALTA) 60 MG capsule Take 1 capsule (60 mg total) by mouth daily. 30 capsule 11  . oxyCODONE-acetaminophen (PERCOCET) 10-325 MG per tablet Take 1 tablet by mouth every 6 (six) hours as needed. For pain    . tiotropium (SPIRIVA) 18 MCG inhalation capsule Place 18 mcg into inhaler and inhale daily.     No facility-administered medications prior to visit.    PAST MEDICAL HISTORY: Past Medical History  Diagnosis Date  . Back pain   . COPD (chronic obstructive pulmonary disease) (HCC)   . Tremors of nervous system     Right  . Multiple sclerosis (HCC)     PAST SURGICAL HISTORY: Past Surgical History  Procedure Laterality Date  . Tonsillectomy    . Adenoidectomy    . Broken leg      FAMILY HISTORY: Family History  Problem Relation Age  of Onset  . Heart Problems Mother     SOCIAL HISTORY: Social History   Social History  . Marital Status: Single    Spouse Name: N/A  . Number of Children: 0  . Years of Education: GED   Occupational History  .      Disabled   Social History Main Topics  . Smoking status: Current Every Day Smoker -- 0.50 packs/day    Types: Cigarettes  . Smokeless tobacco: Never Used     Comment: Half Pack  . Alcohol Use: 0.5 oz/week    1 drink(s) per week     Comment: quit 2 years ago..   pt is now drinking on occ - very rare maybe once a month.  . Drug Use: No  . Sexual Activity: Not on file   Other Topics Concern  . Not on file   Social History Narrative   Patient lives at home with Rogene Houston.   Patient does not have a significant other.    Patient is disabled.   Patient has no children.    Patient has his GED   Right handed.   Caffeine - four glasses and some sodas     PHYSICAL EXAM  Filed Vitals:   06/29/15 1302  BP: 111/72  Pulse:  80  Height: 5\' 7"  (1.702 m)  Weight: 172 lb 9.6 oz (78.291 kg)   Body mass index is 27.03 kg/(m^2). Gen: NAD, conversant, well nourised, obese, well groomed  Cardiovascular: Regular rate rhythm, no peripheral edema, warm, nontender. Neck: Supple, no carotid bruit.  NEUROLOGICAL EXAM:  MENTAL STATUS:Speech: Speech is normal; fluent and spontaneous with normal comprehension.  Cognition:The patient is oriented to person, place, and time; recent and remote memory intact;  language fluent; normal attention, concentration, fund of knowledge.  CRANIAL NERVES: CN II: Visual fields are full to confrontation. Fundoscopic exam is normal with sharp discs and no vascular changes. Pupils were equal round reactive to light. CN III, IV, VI: extraocular movement are normal. No ptosis. CN V: Facial sensation is intact to pinprick in all 3 divisions bilaterally. Corneal responses are intact.  CN VII: Face is symmetric  with normal eye closure and smile. CN VIII: Hearing is normal to rubbing fingers CN IX, X: Palate elevates symmetrically. Phonation is normal. CN XI: Head turning and shoulder shrug are intact CN XII: Tongue is midline with normal movements and no atrophy.  MOTOR: He has normal tone, bulk, mild bilateral hip flexion weakness.mild outstretched tremor right hand REFLEXES: Hyperreflexia on right arm, right leg, plantar responses are flexor bilaterally. SENSORY:Length dependent decreased light touch pinprick to distal shin.  COORDINATION:He has mild dysmetric of finger to nose, heel-to-shin bilaterally, right worse than left GAIT/STANCE: Need to push up to get up from seated position, wide-based, cautious unsteady gait  DIAGNOSTIC DATA (LABS, IMAGING, TESTING) -  ASSESSMENT AND PLAN  59 y.o. year old male  has a past medical history of Back pain; COPD (chronic obstructive pulmonary disease) (HCC); Tremors of nervous system; and Multiple sclerosis (HCC). gait abnormality here to follow-up  Continue Tecfidera at current dose Labs today, CBC and CMP Continue Cymbalta as ordered MRI of lumbar, multilevel degenerative disc disease, most severe at L3-4, L4-5, mild foraminal stenosis, no significant canal stenosis, answered patient's questions regarding his MRI Set up for physical therapy in January, the patient wants to wait till after the  holidays F/U in 6 monthsVst time 25 min Nilda Riggs, [facility], [facility], APRN  [facility] [address], [address] [place], [place] [address] [phone]

## 2015-06-29 NOTE — Patient Instructions (Addendum)
Continue Tecfidera at current dose Labs today F/U in 6 months

## 2015-06-30 LAB — CBC WITH DIFFERENTIAL/PLATELET
BASOS: 1 %
Basophils Absolute: 0 10*3/uL (ref 0.0–0.2)
EOS (ABSOLUTE): 0.1 10*3/uL (ref 0.0–0.4)
Eos: 3 %
Hematocrit: 45.9 % (ref 37.5–51.0)
Hemoglobin: 15.7 g/dL (ref 12.6–17.7)
IMMATURE GRANS (ABS): 0 10*3/uL (ref 0.0–0.1)
IMMATURE GRANULOCYTES: 0 %
Lymphocytes Absolute: 1 10*3/uL (ref 0.7–3.1)
Lymphs: 20 %
MCH: 31.8 pg (ref 26.6–33.0)
MCHC: 34.2 g/dL (ref 31.5–35.7)
MCV: 93 fL (ref 79–97)
MONOCYTES: 10 %
Monocytes Absolute: 0.5 10*3/uL (ref 0.1–0.9)
NEUTROS ABS: 3.2 10*3/uL (ref 1.4–7.0)
NEUTROS PCT: 66 %
PLATELETS: 260 10*3/uL (ref 150–379)
RBC: 4.93 x10E6/uL (ref 4.14–5.80)
RDW: 13.6 % (ref 12.3–15.4)
WBC: 4.8 10*3/uL (ref 3.4–10.8)

## 2015-06-30 LAB — COMPREHENSIVE METABOLIC PANEL
ALT: 16 IU/L (ref 0–44)
AST: 16 IU/L (ref 0–40)
Albumin/Globulin Ratio: 1.8 (ref 1.1–2.5)
Albumin: 4 g/dL (ref 3.5–5.5)
Alkaline Phosphatase: 65 IU/L (ref 39–117)
BUN/Creatinine Ratio: 14 (ref 9–20)
BUN: 11 mg/dL (ref 6–24)
Bilirubin Total: 0.8 mg/dL (ref 0.0–1.2)
CALCIUM: 8.6 mg/dL — AB (ref 8.7–10.2)
CO2: 26 mmol/L (ref 18–29)
CREATININE: 0.8 mg/dL (ref 0.76–1.27)
Chloride: 97 mmol/L (ref 97–106)
GFR, EST AFRICAN AMERICAN: 115 mL/min/{1.73_m2} (ref >59)
GFR, EST NON AFRICAN AMERICAN: 99 mL/min/{1.73_m2} (ref >59)
GLUCOSE: 92 mg/dL (ref 65–99)
Globulin, Total: 2.2 g/dL (ref 1.5–4.5)
Potassium: 4 mmol/L (ref 3.5–5.2)
Sodium: 138 mmol/L (ref 136–144)
TOTAL PROTEIN: 6.2 g/dL (ref 6.0–8.5)

## 2015-07-04 ENCOUNTER — Telehealth: Payer: Self-pay | Admitting: *Deleted

## 2015-07-04 NOTE — Telephone Encounter (Signed)
I called and spoke to lady of house, pt was not able to speak to me at this time.   I told her to have him call me back about lab results.  When he returns call he may be told his lab results are normal.

## 2015-07-04 NOTE — Telephone Encounter (Signed)
-----   Message from Nilda Riggs, NP sent at 07/03/2015  7:56 AM EST ----- Please call normal labs

## 2015-07-04 NOTE — Telephone Encounter (Signed)
Called home no answer, mobile, not able to connect.

## 2015-07-05 NOTE — Telephone Encounter (Signed)
Pt returned call. Pt was told his lab work was normal as indicated in previous tele note.

## 2015-07-20 ENCOUNTER — Telehealth: Payer: Self-pay | Admitting: Neurology

## 2015-07-20 NOTE — Telephone Encounter (Signed)
Dale Edwards with [facility] called and requested to have the last office visit notes faxed over to their office. Fax: [phone].

## 2015-07-20 NOTE — Telephone Encounter (Signed)
Office note faxed to number provided.

## 2015-08-08 ENCOUNTER — Telehealth: Payer: Self-pay | Admitting: Neurology

## 2015-08-08 NOTE — Telephone Encounter (Signed)
I tried all the numbers we have listed - one is disconnected and the other rings multiple times with no voicemail available.  I returned the call to [employer] (spoke to [place]) and they have the same numbers on file.  They will send him a letter requesting a call back from him.  His follow up appointment here is not until June.

## 2015-08-08 NOTE — Telephone Encounter (Signed)
Marisue Ivan with [employer] [phone] called said she had been trying to get in touch with pt regarding tecfidera shipment. She said last shipment was 07/03/15 and thought he should be getting low. The home number has disconnected and the work and friends number is reaching a fax.

## 2015-08-28 ENCOUNTER — Encounter: Payer: Self-pay | Admitting: *Deleted

## 2015-09-05 ENCOUNTER — Other Ambulatory Visit: Payer: Self-pay | Admitting: *Deleted

## 2015-09-05 MED ORDER — DIMETHYL FUMARATE 240 MG PO CPDR: 240.0000 mg | DELAYED_RELEASE_CAPSULE | Freq: Two times a day (BID) | ORAL | Status: DC

## 2015-12-28 ENCOUNTER — Ambulatory Visit: Payer: Medicare Other | Admitting: Nurse Practitioner

## 2016-01-03 ENCOUNTER — Encounter: Payer: Self-pay | Admitting: Nurse Practitioner

## 2016-01-03 ENCOUNTER — Ambulatory Visit (INDEPENDENT_AMBULATORY_CARE_PROVIDER_SITE_OTHER): Payer: Medicare Other | Admitting: Nurse Practitioner

## 2016-01-03 VITALS — BP 109/76 | HR 78 | Ht 67.0 in | Wt 161.8 lb

## 2016-01-03 DIAGNOSIS — R269 Unspecified abnormalities of gait and mobility: Secondary | ICD-10-CM | POA: Diagnosis not present

## 2016-01-03 DIAGNOSIS — M5416 Radiculopathy, lumbar region: Secondary | ICD-10-CM | POA: Diagnosis not present

## 2016-01-03 DIAGNOSIS — G35 Multiple sclerosis: Secondary | ICD-10-CM | POA: Diagnosis not present

## 2016-01-03 DIAGNOSIS — R531 Weakness: Secondary | ICD-10-CM

## 2016-01-03 DIAGNOSIS — M6289 Other specified disorders of muscle: Secondary | ICD-10-CM | POA: Diagnosis not present

## 2016-01-03 DIAGNOSIS — Z5181 Encounter for therapeutic drug level monitoring: Secondary | ICD-10-CM | POA: Diagnosis not present

## 2016-01-03 NOTE — Patient Instructions (Signed)
Continue Tecfidera at current dose Labs today, CBC and CMP to monitor adverse effects of tecfidera Use cane at all times at risk for falls Follow up in 6 months next with Dr. Terrace Arabia

## 2016-01-03 NOTE — Progress Notes (Signed)
[facility]  PATIENT: Dale Edwards DOB: 1956/02/18   REASON FOR VISIT: Follow-up for multiple sclerosis lumbar radiculopathy and abnormality of gait HISTORY FROM: Patient    HISTORY OF PRESENT ILLNESS:HISTORYHe noticed 4 years history of right hand difficulty since 2010, he complains of right hand shaking, especially when he trying to use his right hand, such as holding a utensil, he has to hold his spoon like a child to prevent the food from spitting out, progressively worse over the past one year, he also noticed right leg shaking, especially when he bearing weight since 2013, mild gait difficulty dragging his right leg, he suffered a sudden fell to his right neck in 2007, with right tibial fracture,  He has chronic low back pain, but denies significant shooting pain from back to his lower extremity, he denies bilateral extremity paresthesia, he denies bowel and bladder MRI brain 2014 showed mild-moderate periventricular and subcortical round and ovoid T2 hyperintensities, some of which are confluent.  MRI cervical 2014, spine At C2-C3, there is posterior central T2 hyperintense spinal cord lesion.  Above findings are consistent with multiple sclerosis.  He has no clear clinical relapsing and remitting, over the past few years, continue gradually worsening gait difficulty especially the right arm and the right leg difficulty.  Laboratory evaluation showed normal or negative vitamin B12, RPR, folic acid, ANA, HIV, C. reactive protein, TSH, CPK, Normal or negative Lyme titer, ACE level, hepatitis panel,  Spinal fluid testing showed WBC 1, glucose 44, total protein of 56, more than 5 oligoclonal banding, and increased IgG synthetic rate, increased IgG index,  He had a past medical history of chronic low back pain, right leg pain, knee pain, COPD, has been on disability since March 2014 due to physical difficulty, right side difficulty  He was started on  tecfidera in August 2014. He denies any side effects to the drug, he has not had any flushing. He denies any falls, he continues to have mild gait difficulty. He denies any loss of bowel or bladder control he has a history of chronic back pain and has been on disability for that.  UPDATE Jan 4th 2016: He tolerate the Tecfidera twice a day, today his main complaint is worsening low back pain, radiating pain to his left lower extremity, worsening gait difficulty, he fell occasionally MRI brain July 2015 at [facility] imaging  1. Moderate periventricular and subcortical chronic demyelinating plaques. Some of these are confluent. Some are hypointense on T1 views. No acute plaques. 2. Moderate perisylvian and corpus callosal atrophy. 3. Compared to MRI from 01/22/13 there is no change. JC virus was positive with titer of 0.8 4 in August 01 2014 UPDATE December 28 2014: He is with his wife at today's clinical visit, he continues to complain low back pain, going down both legs, gait difficulty, increased bilateral hands, leg tremor, right worse than left, He is taking Percocet 10/325 mg 5-6 tablets, Valium 10 mg 3 times daily from primary care Dr. Mikeal Hawthorne.We have reviewed his MRI scans in May 2016 MRI of the brain, confluent periventricular chronic MS lesions, no contrast enhancement, no change compared to previous scan in July 2015 MRI of cervical, posterior ill-defined C2-3 noncontrast enhancing lesion, no change compared to previous scan in June 2014 MRI of lumbar, multilevel degenerative disc disease, most severe at L3-4, L4-5, mild foraminal stenosis, no significant canal stenosis UPDATE 6/7/17CM Mr. Bracamonte, 60 year old returns for follow-up. He has a history of relapsing remitting multiple sclerosis and has been  on tecfidera since August 2014.he continues to have low back pain, gait difficulty but denies any falls, ambulating with a cane today. He continues to have right hand tremor.  He is taking  Percocet for his back pain through primary care.  He returns for reevaluation. He denies any blurred vision double vision weakness and speech or swallowing difficulty.   REVIEW OF SYSTEMS: Full 14 system review of systems performed and notable only for those listed, all others are neg:  Constitutional: neg  Cardiovascular: neg Ear/Nose/Throat: neg  Skin: neg Eyes: neg Respiratory: Cough Gastroitestinal: neg  Hematology/Lymphatic: neg  Endocrine: neg Musculoskeletal: Back pain, gait abnormality Allergy/Immunology: neg Neurological: neg Psychiatric: neg Sleep : neg   ALLERGIES: Allergies  Allergen Reactions  . Hydrocodone-Acetaminophen Itching and Nausea Only         HOME MEDICATIONS: Outpatient Prescriptions Prior to Visit  Medication Sig Dispense Refill  . albuterol (PROVENTIL HFA;VENTOLIN HFA) 108 (90 BASE) MCG/ACT inhaler Inhale 2 puffs into the lungs every 6 (six) hours as needed.    . diazepam (VALIUM) 10 MG tablet Take 10 mg by mouth 3 (three) times daily.    . Dimethyl Fumarate 240 MG CPDR Take 1 capsule (240 mg total) by mouth 2 (two) times daily. 60 capsule 11  . oxyCODONE-acetaminophen (PERCOCET) 10-325 MG per tablet Take 1 tablet by mouth every 6 (six) hours as needed. For pain    . UNABLE TO FIND Med Name:  Oxygen 2L/[place] at night.    . DULoxetine (CYMBALTA) 60 MG capsule Take 1 capsule (60 mg total) by mouth daily. 30 capsule 11   No facility-administered medications prior to visit.    PAST MEDICAL HISTORY: Past Medical History  Diagnosis Date  . Back pain   . COPD (chronic obstructive pulmonary disease) (HCC)   . Tremors of nervous system     Right  . Multiple sclerosis (HCC)     PAST SURGICAL HISTORY: Past Surgical History  Procedure Laterality Date  . Tonsillectomy    . Adenoidectomy    . Broken leg      FAMILY HISTORY: Family History  Problem Relation Age of Onset  . Heart Problems Mother     SOCIAL HISTORY: Social History   Social  History  . Marital Status: Single    Spouse Name: N/A  . Number of Children: 0  . Years of Education: GED   Occupational History  .      Disabled   Social History Main Topics  . Smoking status: Current Every Day Smoker -- 0.50 packs/day    Types: Cigarettes  . Smokeless tobacco: Never Used     Comment: Half Pack  . Alcohol Use: 0.5 oz/week    1 drink(s) per week     Comment: quit 2 years ago..   pt is now drinking on occ - very rare maybe once a month.  . Drug Use: No  . Sexual Activity: Not on file   Other Topics Concern  . Not on file   Social History Narrative   Patient lives at home with Rogene Houston.   Patient does not have a significant other.    Patient is disabled.   Patient has no children.    Patient has his GED   Right handed.   Caffeine - four glasses and some sodas     PHYSICAL EXAM  Filed Vitals:   01/03/16 1108  BP: 109/76  Pulse: 78  Height: 5\' 7"  (1.702 m)  Weight: 161 lb 12.8 oz (  73.392 kg)   Body mass index is 25.34 kg/(m^2). Gen: NAD, conversant, well nourised,  well groomed  Cardiovascular: Regular rate rhythm, no peripheral edema, warm, nontender. Neck: Supple, no carotid bruit.  NEUROLOGICAL EXAM:  MENTAL STATUS:Speech: Speech is normal; fluent and spontaneous with normal comprehension.  Cognition:The patient is oriented to person, place, and time; recent and remote memory intact;  language fluent; normal attention, concentration, fund of knowledge.  CRANIAL NERVES: CN II: Visual fields are full to confrontation. Fundoscopic exam is normal with sharp discs . Pupils were equal round reactive to light. CN III, IV, VI: extraocular movement are normal. No ptosis. CN V: Facial sensation is intact to pinprick in all 3 divisions bilaterally.  CN VII: Face is symmetric with normal eye closure and smile. CN VIII: Hearing is normal to rubbing fingers CN IX, X: Palate elevates symmetrically. Phonation is  normal. CN XI: Head turning and shoulder shrug are intact CN XII: Tongue is midline with normal movements and no atrophy.  MOTOR:He has normal tone, bulk, mild bilateral hip flexion weakness.mild outstretched tremor right hand REFLEXES:Hyperreflexia on right arm, right leg, plantar responses are flexor bilaterally. SENSORY:Length dependent decreased light touch pinprick to distal shin.  COORDINATION:He has mild dysmetric of finger to nose, heel-to-shin bilaterally, right worse than left GAIT/STANCE:Need to push up to get up from seated position, wide-based, cautious unsteady gait. Ambulates with single-point cane DIAGNOSTIC DATA (LABS, IMAGING, TESTING) - I reviewed patient records, labs, notes, testing and imaging myself where available.  Lab Results  Component Value Date   WBC 4.8 06/29/2015   HGB 18.9* 08/01/2014   HCT 45.9 06/29/2015   MCV 93 06/29/2015   PLT 260 06/29/2015      Component Value Date/Time   NA 138 06/29/2015 1343   NA 135* 03/25/2014 2030   K 4.0 06/29/2015 1343   CL 97 06/29/2015 1343   CO2 26 06/29/2015 1343   GLUCOSE 92 06/29/2015 1343   GLUCOSE 99 03/25/2014 2030   BUN 11 06/29/2015 1343   BUN 9 03/25/2014 2030   CREATININE 0.80 06/29/2015 1343   CALCIUM 8.6* 06/29/2015 1343   PROT 6.2 06/29/2015 1343   PROT 6.7 08/06/2012 1239   ALBUMIN 4.0 06/29/2015 1343   ALBUMIN 3.5 08/06/2012 1239   AST 16 06/29/2015 1343   ALT 16 06/29/2015 1343   ALKPHOS 65 06/29/2015 1343   BILITOT 0.8 06/29/2015 1343   BILITOT 1.4* 08/01/2014 1315   GFRNONAA 99 06/29/2015 1343   GFRAA 115 06/29/2015 1343     ASSESSMENT AND PLAN 59 y.o. year old male  has a past medical history of Back pain; COPD (chronic obstructive pulmonary disease) (HCC); Tremors of nervous system; and Multiple sclerosis (HCC). gait abnormality here to follow-up  Continue Tecfidera at current dose Labs today, CBC and CMP To monitor adverse effects of tecfidera At risk for falls use cane at all  times Call for exacerbation of symptoms Follow-up in 6 months next with Dr. Carmelina Noun, [facility], [facility], APRN  [facility] [address], [address] [place], [place] [address] [phone]

## 2016-01-04 LAB — CBC WITH DIFFERENTIAL/PLATELET
BASOS: 1 %
Basophils Absolute: 0 10*3/uL (ref 0.0–0.2)
EOS (ABSOLUTE): 0.1 10*3/uL (ref 0.0–0.4)
Eos: 1 %
Hematocrit: 50.7 % (ref 37.5–51.0)
Hemoglobin: 17.4 g/dL (ref 12.6–17.7)
Immature Grans (Abs): 0 10*3/uL (ref 0.0–0.1)
Immature Granulocytes: 0 %
LYMPHS ABS: 0.8 10*3/uL (ref 0.7–3.1)
Lymphs: 15 %
MCH: 31.9 pg (ref 26.6–33.0)
MCHC: 34.3 g/dL (ref 31.5–35.7)
MCV: 93 fL (ref 79–97)
MONOS ABS: 0.4 10*3/uL (ref 0.1–0.9)
Monocytes: 8 %
NEUTROS ABS: 4.1 10*3/uL (ref 1.4–7.0)
Neutrophils: 75 %
PLATELETS: 247 10*3/uL (ref 150–379)
RBC: 5.46 x10E6/uL (ref 4.14–5.80)
RDW: 13.3 % (ref 12.3–15.4)
WBC: 5.5 10*3/uL (ref 3.4–10.8)

## 2016-01-04 LAB — COMPREHENSIVE METABOLIC PANEL
ALT: 13 IU/L (ref 0–44)
AST: 12 IU/L (ref 0–40)
Albumin/Globulin Ratio: 1.5 (ref 1.2–2.2)
Albumin: 4.1 g/dL (ref 3.5–5.5)
Alkaline Phosphatase: 91 IU/L (ref 39–117)
BUN / CREAT RATIO: 10 (ref 9–20)
BUN: 8 mg/dL (ref 6–24)
Bilirubin Total: 0.6 mg/dL (ref 0.0–1.2)
CALCIUM: 9.3 mg/dL (ref 8.7–10.2)
CO2: 22 mmol/L (ref 18–29)
Chloride: 99 mmol/L (ref 96–106)
Creatinine, Ser: 0.79 mg/dL (ref 0.76–1.27)
GFR, EST AFRICAN AMERICAN: 115 mL/min/{1.73_m2} (ref >59)
GFR, EST NON AFRICAN AMERICAN: 100 mL/min/{1.73_m2} (ref >59)
GLOBULIN, TOTAL: 2.7 g/dL (ref 1.5–4.5)
Glucose: 87 mg/dL (ref 65–99)
POTASSIUM: 4.6 mmol/L (ref 3.5–5.2)
Sodium: 141 mmol/L (ref 134–144)
TOTAL PROTEIN: 6.8 g/dL (ref 6.0–8.5)

## 2016-01-08 ENCOUNTER — Telehealth: Payer: Self-pay | Admitting: Nurse Practitioner

## 2016-01-08 NOTE — Telephone Encounter (Signed)
-----   Message from Nilda Riggs, NP sent at 01/04/2016  8:58 AM EDT ----- Labs normal please call the patient

## 2016-01-08 NOTE — Telephone Encounter (Signed)
Called and spoke to patient and relayed labs were normal. Patient understood.

## 2016-01-16 NOTE — Progress Notes (Signed)
I have reviewed and agreed above plan. 

## 2016-07-08 ENCOUNTER — Encounter: Payer: Self-pay | Admitting: Neurology

## 2016-07-08 ENCOUNTER — Ambulatory Visit (INDEPENDENT_AMBULATORY_CARE_PROVIDER_SITE_OTHER): Payer: Medicare Other | Admitting: Neurology

## 2016-07-08 VITALS — BP 120/81 | HR 91 | Ht 67.0 in | Wt 172.0 lb

## 2016-07-08 DIAGNOSIS — G35 Multiple sclerosis: Secondary | ICD-10-CM

## 2016-07-08 DIAGNOSIS — M5416 Radiculopathy, lumbar region: Secondary | ICD-10-CM | POA: Diagnosis not present

## 2016-07-08 DIAGNOSIS — M5442 Lumbago with sciatica, left side: Secondary | ICD-10-CM | POA: Diagnosis not present

## 2016-07-08 MED ORDER — DULOXETINE HCL 60 MG PO CPEP: 60.0000 mg | ORAL_CAPSULE | Freq: Every day | ORAL | 12 refills | Status: DC

## 2016-07-08 NOTE — Progress Notes (Signed)
[facility]  PATIENT: Dale Edwards DOB: March 07, 1956  HISTORY OF PRESENT ILLNESS: He noticed right hand difficulty since 2010, he complains of right hand shaking, especially when he trying to use his right hand, such as holding a utensil, he has to hold his spoon like a child to prevent the food from spitting out, progressively worse over the past one year, he also noticed right leg shaking, especially when he bearing weight since 2013, mild gait difficulty dragging his right leg, he suffered a sudden fell to his right neck in 2007, with right tibial fracture,  He has chronic low back pain, but denies significant shooting pain from back to his lower extremity, he denies bilateral extremity paresthesia, he denies bowel and bladder  MRI brain 2014 showed mild-moderate periventricular and subcortical round and ovoid T2 hyperintensities, some of which are confluent.  MRI cervical 2014, spine At C2-C3, there is posterior central T2 hyperintense spinal cord lesion.  Above findings are consistent with multiple sclerosis.  He has no clear clinical relapsing and remitting, over the past few years, continue gradually worsening gait difficulty especially the right arm and the right leg difficulty.  Laboratory evaluation showed normal or negative vitamin B12, RPR, folic acid, ANA, HIV, C. reactive protein, TSH, CPK, Normal or negative Lyme titer, ACE level, hepatitis panel,  Spinal fluid testing showed WBC 1, glucose 44, total protein of 56, more than 5 oligoclonal banding, and increased IgG synthetic rate, increased IgG index,  He had a past medical history of chronic low back pain, right leg pain, knee pain, COPD, has been on disability since March 2014 due to physical difficulty, right side difficulty  He was started on tecfidera since August 2014. He denies any side effects to the drug, he has not had any flushing. He denies any falls, he continues to have mild gait  difficulty. He denies any loss of bowel or bladder control he has a history of chronic back pain and has been on disability for that.  UPDATE Jan 4th 2016: He tolerate the Tecfidera twice a day, today his main complaint is worsening low back pain, radiating pain to his left lower extremity, worsening gait difficulty, he fell occasionally MRI brain July 2015 at [facility] imaging  1. Moderate periventricular and subcortical chronic demyelinating plaques. Some of these are confluent. Some are hypointense on T1 views. No acute plaques. 2. Moderate perisylvian and corpus callosal atrophy. 3. Compared to MRI from 01/22/13 there is no change. JC virus was positive with titer of 0.8 4 in August 01 2014 UPDATE December 28 2014: He is with his wife at today's clinical visit, he continues to complain low back pain, going down both legs, gait difficulty, increased bilateral hands, leg tremor, right worse than left, He is taking Percocet 10/325 mg 5-6 tablets, Valium 10 mg 3 times daily from primary care Dr. Mikeal Edwards.We have reviewed his MRI scans in May 2016 MRI of the brain, confluent periventricular chronic MS lesions, no contrast enhancement, no change compared to previous scan in July 2015 MRI of cervical, posterior ill-defined C2-3 noncontrast enhancing lesion, no change compared to previous scan in June 2014 MRI of lumbar, multilevel degenerative disc disease, most severe at L3-4, L4-5, mild foraminal stenosis, no significant canal stenosis  UPDATE Jul 08 2016: He complains of increased right arm/leg shaking, increased gait abnormality, continue to get worse, he has no bowel and bladder incontinence.  He does have urinary urgency.    He has significant right hand dysmetria, slow  worsening gait abnormality, I personally reviewed MRI of the brain in May 2016, multiple periventricular MS lesions, no change compared to 2015, MRI cervical lesions, ill defined spinal cord hyperintensity lesion at C2-3, likely  chronic demyelinating plaque, MRI lumbar multilevel degenerative disc diseasesignificant foraminal canal stenosis.  After discuss with patient we decided to proceed with Tysarbri infusion, potential side effect explained   REVIEW OF SYSTEMS: Full 14 system review of systems performed and notable only for those listed, all others are neg: Tremor, difficulty urinating   ALLERGIES: Allergies  Allergen Reactions  . Hydrocodone-Acetaminophen Itching and Nausea Only         HOME MEDICATIONS: Outpatient Medications Prior to Visit  Medication Sig Dispense Refill  . albuterol (PROVENTIL HFA;VENTOLIN HFA) 108 (90 BASE) MCG/ACT inhaler Inhale 2 puffs into the lungs every 6 (six) hours as needed.    . diazepam (VALIUM) 10 MG tablet Take 10 mg by mouth 3 (three) times daily.    . Dimethyl Fumarate 240 MG CPDR Take 1 capsule (240 mg total) by mouth 2 (two) times daily. 60 capsule 11  . oxyCODONE-acetaminophen (PERCOCET) 10-325 MG per tablet Take 1 tablet by mouth every 6 (six) hours as needed. For pain    . UNABLE TO FIND Med Name:  Oxygen 2L/[place] at night.     No facility-administered medications prior to visit.     PAST MEDICAL HISTORY: Past Medical History:  Diagnosis Date  . Back pain   . COPD (chronic obstructive pulmonary disease) (HCC)   . Multiple sclerosis (HCC)   . Tremors of nervous system    Right    PAST SURGICAL HISTORY: Past Surgical History:  Procedure Laterality Date  . ADENOIDECTOMY    . broken leg    . TONSILLECTOMY      FAMILY HISTORY: Family History  Problem Relation Age of Onset  . Heart Problems Mother     SOCIAL HISTORY: Social History   Social History  . Marital status: Single    Spouse name: N/A  . Number of children: 0  . Years of education: GED   Occupational History  .  Unemployed    Disabled   Social History Main Topics  . Smoking status: Current Every Day Smoker    Packs/day: 0.50    Types: Cigarettes  . Smokeless tobacco: Never Used       Comment: Half Pack  . Alcohol use 0.5 oz/week    1 drink(s) per week     Comment: quit 2 years ago..   pt is now drinking on occ - very rare maybe once a month.  . Drug use: No  . Sexual activity: Not on file   Other Topics Concern  . Not on file   Social History Narrative   Patient lives at home with Dale Edwards.   Patient does not have a significant other.    Patient is disabled.   Patient has no children.    Patient has his GED   Right handed.   Caffeine - four glasses and some sodas     PHYSICAL EXAM  Vitals:   07/08/16 1407  BP: 120/81  Pulse: 91  Weight: 172 lb (78 kg)  Height: 5\' 7"  (1.702 m)   Body mass index is 26.94 kg/m.  PHYSICAL EXAMNIATION:  Gen: NAD, conversant, well nourised, obese, well groomed                     Cardiovascular: Regular rate rhythm, no peripheral edema, warm,  nontender. Eyes: Conjunctivae clear without exudates or hemorrhage Neck: Supple, no carotid bruits. Pulmonary: Clear to auscultation bilaterally   NEUROLOGICAL EXAM:  MENTAL STATUS: Speech:    Speech is normal; fluent and spontaneous with normal comprehension.  Cognition:     Orientation to time, place and person     Normal recent and remote memory     Normal Attention span and concentration     Normal Language, naming, repeating,spontaneous speech     Fund of knowledge   CRANIAL NERVES: CN II: Visual fields are full to confrontation. Fundoscopic exam is normal with sharp discs and no vascular changes. Pupils are round equal and briskly reactive to light. CN III, IV, VI: extraocular movement are normal. No ptosis. CN V: Facial sensation is intact to pinprick in all 3 divisions bilaterally. Corneal responses are intact.  CN VII: Face is symmetric with normal eye closure and smile. CN VIII: Hearing is normal to rubbing fingers CN IX, X: Palate elevates symmetrically. Phonation is normal. CN XI: Head turning and shoulder shrug are intact CN XII: Tongue is  midline with normal movements and no atrophy.  MOTOR: There is no pronator drift of out-stretched arms. Muscle bulk and tone are normal. Muscle strength is normal.  REFLEXES: Reflexes are 2+ and symmetric at the biceps, triceps, knees, and ankles. Plantar responses are flexor.  SENSORY: Intact to light touch, pinprick, positional and vibratory sensation are intact in fingers and toes.  COORDINATION: He has significant right finger to nose dysmetria, moderate heel-to-shin dysmetria,  GAIT/STANCE: He needs assistance to get up from seated position, dragging his right leg, depend on his cane,      DIAGNOSTIC DATA (LABS, IMAGING, TESTING) - I reviewed patient records, labs, notes, testing and imaging myself where available.  Lab Results  Component Value Date   WBC 5.5 01/03/2016   HGB 18.9 (H) 08/01/2014   HCT 50.7 01/03/2016   MCV 93 01/03/2016   PLT 247 01/03/2016      Component Value Date/Time   NA 141 01/03/2016 1134   K 4.6 01/03/2016 1134   CL 99 01/03/2016 1134   CO2 22 01/03/2016 1134   GLUCOSE 87 01/03/2016 1134   GLUCOSE 99 03/25/2014 2030   BUN 8 01/03/2016 1134   CREATININE 0.79 01/03/2016 1134   CALCIUM 9.3 01/03/2016 1134   PROT 6.8 01/03/2016 1134   ALBUMIN 4.1 01/03/2016 1134   AST 12 01/03/2016 1134   ALT 13 01/03/2016 1134   ALKPHOS 91 01/03/2016 1134   BILITOT 0.6 01/03/2016 1134   GFRNONAA 100 01/03/2016 1134   GFRAA 115 01/03/2016 1134     ASSESSMENT AND PLAN 60 y.o. year old male Relapsing remitting multiple sclerosis  MRI of the brain with without contrast Laboratory evaluations including JC virus antibody, VZV antibody Continue Tecfidera for right now, Will switch him to Tysarbri infusion in he has low titer JC virus Return to clinic in 3 months      Levert FeinsteinYijun Keymani Mclean, M.D. Ph.D.  [facility] [address] [place], [place] [address] Phone: [phone] Fax:      [phone]

## 2016-07-09 ENCOUNTER — Telehealth: Payer: Self-pay | Admitting: Neurology

## 2016-07-09 ENCOUNTER — Other Ambulatory Visit (INDEPENDENT_AMBULATORY_CARE_PROVIDER_SITE_OTHER): Payer: Self-pay

## 2016-07-09 ENCOUNTER — Encounter: Payer: Self-pay | Admitting: *Deleted

## 2016-07-09 DIAGNOSIS — Z0289 Encounter for other administrative examinations: Secondary | ICD-10-CM

## 2016-07-09 LAB — COMPREHENSIVE METABOLIC PANEL
ALBUMIN: 4.3 g/dL (ref 3.5–5.5)
ALT: 32 IU/L (ref 0–44)
AST: 21 IU/L (ref 0–40)
Albumin/Globulin Ratio: 2 (ref 1.2–2.2)
Alkaline Phosphatase: 120 IU/L — ABNORMAL HIGH (ref 39–117)
BUN / CREAT RATIO: 11 (ref 9–20)
BUN: 9 mg/dL (ref 6–24)
Bilirubin Total: 1.2 mg/dL (ref 0.0–1.2)
CALCIUM: 9.3 mg/dL (ref 8.7–10.2)
CO2: 25 mmol/L (ref 18–29)
CREATININE: 0.82 mg/dL (ref 0.76–1.27)
Chloride: 101 mmol/L (ref 96–106)
GFR calc Af Amer: 113 mL/min/{1.73_m2} (ref >59)
GFR, EST NON AFRICAN AMERICAN: 97 mL/min/{1.73_m2} (ref >59)
Globulin, Total: 2.2 g/dL (ref 1.5–4.5)
Glucose: 101 mg/dL — ABNORMAL HIGH (ref 65–99)
Potassium: 4.2 mmol/L (ref 3.5–5.2)
SODIUM: 144 mmol/L (ref 134–144)
Total Protein: 6.5 g/dL (ref 6.0–8.5)

## 2016-07-09 LAB — CBC WITH DIFFERENTIAL/PLATELET
Basophils Absolute: 0 10*3/uL (ref 0.0–0.2)
Basos: 0 %
EOS (ABSOLUTE): 0 10*3/uL (ref 0.0–0.4)
EOS: 0 %
HEMATOCRIT: 54 % — AB (ref 37.5–51.0)
HEMOGLOBIN: 17.9 g/dL — AB (ref 13.0–17.7)
Immature Grans (Abs): 0 10*3/uL (ref 0.0–0.1)
Immature Granulocytes: 0 %
LYMPHS ABS: 0.6 10*3/uL — AB (ref 0.7–3.1)
LYMPHS: 7 %
MCH: 33.2 pg — ABNORMAL HIGH (ref 26.6–33.0)
MCHC: 33.1 g/dL (ref 31.5–35.7)
MCV: 100 fL — ABNORMAL HIGH (ref 79–97)
MONOCYTES: 8 %
Monocytes Absolute: 0.7 10*3/uL (ref 0.1–0.9)
Neutrophils Absolute: 7.3 10*3/uL — ABNORMAL HIGH (ref 1.4–7.0)
Neutrophils: 85 %
Platelets: 288 10*3/uL (ref 150–379)
RBC: 5.39 x10E6/uL (ref 4.14–5.80)
RDW: 14.2 % (ref 12.3–15.4)
WBC: 8.7 10*3/uL (ref 3.4–10.8)

## 2016-07-09 LAB — VITAMIN D 25 HYDROXY (VIT D DEFICIENCY, FRACTURES): VIT D 25 HYDROXY: 7.1 ng/mL — AB (ref 30.0–100.0)

## 2016-07-09 LAB — VARICELLA ZOSTER ANTIBODY, IGG: VARICELLA: 2396 {index} (ref >165)

## 2016-07-09 LAB — CK: Total CK: 33 U/L (ref 24–204)

## 2016-07-09 NOTE — Telephone Encounter (Signed)
Updated paperwork and faxed back to [employer].

## 2016-07-09 NOTE — Telephone Encounter (Signed)
Natasha/[employer] [phone] called says she rec'd start form for  tysabri but on pg 4 the provider needs to date the form before it can be processed. Please fax back

## 2016-07-09 NOTE — Telephone Encounter (Signed)
Pt and his friend, Steward Drone (on HIPAA) are aware of results.  He will start the recommended supplement.

## 2016-07-09 NOTE — Telephone Encounter (Signed)
Please call patient, laboratory evaluation showed significant vitamin D deficiency, he should take vitamin D 3 supplement, at least 1000 units daily.

## 2016-07-15 ENCOUNTER — Telehealth: Payer: Self-pay | Admitting: *Deleted

## 2016-07-15 NOTE — Telephone Encounter (Signed)
Labs collected 07/10/16: JCV - 0.67 positive  Dr. Terrace ArabiaYan has reviewed results.

## 2016-07-16 ENCOUNTER — Ambulatory Visit
Admission: RE | Admit: 2016-07-16 | Discharge: 2016-07-16 | Disposition: A | Discharge status: Home / Self Care | Payer: Self-pay | Source: Ambulatory Visit | Attending: Neurology | Admitting: Neurology

## 2016-07-16 DIAGNOSIS — G35 Multiple sclerosis: Secondary | ICD-10-CM

## 2016-07-16 DIAGNOSIS — M5442 Lumbago with sciatica, left side: Secondary | ICD-10-CM

## 2016-07-16 DIAGNOSIS — M5416 Radiculopathy, lumbar region: Secondary | ICD-10-CM

## 2016-07-16 MED ORDER — GADOBENATE DIMEGLUMINE 529 MG/ML IV SOLN
16.0000 mL | Freq: Once | INTRAVENOUS | Status: AC | PRN
  Administered 2016-07-16: 16 mL via INTRAVENOUS

## 2016-07-18 ENCOUNTER — Telehealth: Payer: Self-pay | Admitting: Neurology

## 2016-07-18 NOTE — Telephone Encounter (Signed)
  Please call patient, MRI of the brain showed generalized atrophy, moderate supratentorium plaque, no acute changes.  IMPRESSION:  Abnormal MRI brain (with and without) demonstrating: 1. Mild diffuse, moderate perisylvian and severe corpus callosum atrophy. 2. Moderate periventricular, subcortical and left thalamic chronic demyelinating plaques.  3. No abnormal enhancing lesions. 4. No acute findings. 5. No significant change from MRI on 11/30/14.

## 2016-07-18 NOTE — Telephone Encounter (Signed)
Spoke to OdanahBrenda (significant other on HIPPA) - [place] unavailable - she is aware of results and verbalized understanding.  Provided our number to call back with any questions.

## 2016-08-27 ENCOUNTER — Telehealth: Payer: Self-pay | Admitting: Neurology

## 2016-08-27 NOTE — Telephone Encounter (Signed)
Dorene Grebe with [facility] called in reference to patient requesting refill for tecsidera 240mg  capsule, states patient will be out of medication 09/03/16.  Dorene Grebe said per  patient he will not be starting Tysabri until March so needing to know if he will be receiving a refill for tecsidera 250mg  or discontinuing until beginning Tysabri in March.  Please call

## 2016-08-27 NOTE — Telephone Encounter (Signed)
Patient will be starting Tysbri in 4 or less weeks.  He will not be continuing therapy with Tecfidera.  Returned call to [employer] (spoke to [place]) - they will remove Tecfidera from his profile.

## 2016-09-03 NOTE — Telephone Encounter (Signed)
Ala Dach with [facility] is calling regarding status of Tysabri order.

## 2016-09-03 NOTE — Telephone Encounter (Addendum)
Patient's medication is being authorized by [facility].  Informed Dale Edwards that [facility] called to check status. Called [facility] [phone]) and provided them with [facility]'s phone number [phone]) to call to check status.

## 2016-09-10 ENCOUNTER — Other Ambulatory Visit: Payer: Self-pay | Admitting: *Deleted

## 2016-10-14 ENCOUNTER — Encounter: Payer: Self-pay | Admitting: Neurology

## 2016-10-14 ENCOUNTER — Ambulatory Visit (INDEPENDENT_AMBULATORY_CARE_PROVIDER_SITE_OTHER): Payer: Medicare Other | Admitting: Neurology

## 2016-10-14 VITALS — BP 120/84 | HR 80 | Ht 67.0 in | Wt 173.0 lb

## 2016-10-14 DIAGNOSIS — M5416 Radiculopathy, lumbar region: Secondary | ICD-10-CM | POA: Diagnosis not present

## 2016-10-14 DIAGNOSIS — G35 Multiple sclerosis: Secondary | ICD-10-CM | POA: Diagnosis not present

## 2016-10-14 DIAGNOSIS — E559 Vitamin D deficiency, unspecified: Secondary | ICD-10-CM | POA: Diagnosis not present

## 2016-10-14 NOTE — Progress Notes (Signed)
[facility]  PATIENT: Dale Edwards DOB: Jun 09, 1956  HISTORY OF PRESENT ILLNESS:  Follow-up of relapsing remitting multiple sclerosis,  He presented with right hand difficulty since 2010, he complains of right hand shaking, especially when he trying to use his right hand, such as holding a utensil, he has to hold his spoon like a child to prevent the food from spitting out, progressively worse over the past one year, he also noticed right leg shaking, especially when he bearing weight since 2013, mild gait difficulty dragging his right leg, he suffered a sudden fell to his right neck in 2007, with right tibial fracture,  He has chronic low back pain, but denies significant shooting pain from back to his lower extremity, he denies bilateral extremity paresthesia, he denies bowel and bladder  MRI brain 2014 showed mild-moderate periventricular and subcortical round and ovoid T2 hyperintensities, some of which are confluent.  MRI cervical 2014, spine At C2-C3, there is posterior central T2 hyperintense spinal cord lesion.  Above findings are consistent with multiple sclerosis.  He has no clear clinical relapsing and remitting, over the past few years, continue gradually worsening gait difficulty especially the right arm and the right leg difficulty.  Laboratory evaluation showed normal or negative vitamin B12, RPR, folic acid, ANA, HIV, C. reactive protein, TSH, CPK, Normal or negative Lyme titer, ACE level, hepatitis panel,  Spinal fluid testing showed WBC 1, glucose 44, total protein of 56, more than 5 oligoclonal banding, and increased IgG synthetic rate, increased IgG index,  He had a past medical history of chronic low back pain, right leg pain, knee pain, COPD, has been on disability since March 2014 due to physical difficulty, right side difficulty  He was started on tecfidera since August 2014. He denies any side effects to the drug, he has not had any  flushing. He denies any falls, he continues to have mild gait difficulty. He denies any loss of bowel or bladder control he has a history of chronic back pain and has been on disability for that.  UPDATE Jan 4th 2016: He tolerate the Tecfidera twice a day, today his main complaint is worsening low back pain, radiating pain to his left lower extremity, worsening gait difficulty, he fell occasionally MRI brain July 2015 at [facility] imaging  1. Moderate periventricular and subcortical chronic demyelinating plaques. Some of these are confluent. Some are hypointense on T1 views. No acute plaques. 2. Moderate perisylvian and corpus callosal atrophy. 3. Compared to MRI from 01/22/13 there is no change. JC virus was positive with titer of 0.8 4 in August 01 2014 UPDATE December 28 2014: He is with his wife at today's clinical visit, he continues to complain low back pain, going down both legs, gait difficulty, increased bilateral hands, leg tremor, right worse than left, He is taking Percocet 10/325 mg 5-6 tablets, Valium 10 mg 3 times daily from primary care Dr. Mikeal Hawthorne.We have reviewed his MRI scans in May 2016 MRI of the brain, confluent periventricular chronic MS lesions, no contrast enhancement, no change compared to previous scan in July 2015 MRI of cervical, posterior ill-defined C2-3 noncontrast enhancing lesion, no change compared to previous scan in June 2014 MRI of lumbar, multilevel degenerative disc disease, most severe at L3-4, L4-5, mild foraminal stenosis, no significant canal stenosis  UPDATE Jul 08 2016: He complains of increased right arm/leg shaking, increased gait abnormality, continue to get worse, he has no bowel and bladder incontinence.  He does have urinary urgency.  He has significant right hand dysmetria, slow worsening gait abnormality, I personally reviewed MRI of the brain in May 2016, multiple periventricular MS lesions, no change compared to 2015, MRI cervical lesions, ill  defined spinal cord hyperintensity lesion at C2-3, likely chronic demyelinating plaque, MRI lumbar multilevel degenerative disc diseasesignificant foraminal canal stenosis.  After discuss with patient we decided to proceed with Tysarbri infusion, potential side effect explained  Update October 14 2016:  JC virus titer was 0.67 in December 2017, he was [place] to [place] infusion, the first one was in February 2018, will have repeat infusion in October 15 2016. He tolerated the infusion are well, no significant side effect noticed. He can walk better, feels more energetic  I reviewed laboratory evaluations, in December 2017, low vitamin D 7.1, varicella-zoster IgG 2396, CBC showed elevated hemoglobin 7.9, normal CPK, CMP,  I have personally reviewed MRI of brain with and without contrast in December, in comparison to previous scan in May 2016, moderate supratentorium lesions noted, no contrast enhancement no significant changes.     REVIEW OF SYSTEMS: Full 14 system review of systems performed and notable only for those listed, all others are neg: Tremor, difficulty urinating   ALLERGIES: Allergies  Allergen Reactions  . Hydrocodone-Acetaminophen Itching and Nausea Only         HOME MEDICATIONS: Outpatient Medications Prior to Visit  Medication Sig Dispense Refill  . albuterol (PROVENTIL HFA;VENTOLIN HFA) 108 (90 BASE) MCG/ACT inhaler Inhale 2 puffs into the lungs every 6 (six) hours as needed.    . cholecalciferol (VITAMIN D) 1000 units tablet Take 1,000 Units by mouth daily.    . Dimethyl Fumarate 240 MG CPDR Take 1 capsule (240 mg total) by mouth 2 (two) times daily. 60 capsule 11  . DULoxetine (CYMBALTA) 60 MG capsule Take 1 capsule (60 mg total) by mouth daily. 30 capsule 12  . UNABLE TO FIND Med Name:  Oxygen 2L/[place] at night.    . diazepam (VALIUM) 10 MG tablet Take 10 mg by mouth 3 (three) times daily.    Marland Kitchen oxyCODONE-acetaminophen (PERCOCET) 10-325 MG per tablet Take 1 tablet by  mouth every 6 (six) hours as needed. For pain     No facility-administered medications prior to visit.     PAST MEDICAL HISTORY: Past Medical History:  Diagnosis Date  . Back pain   . COPD (chronic obstructive pulmonary disease) (HCC)   . Multiple sclerosis (HCC)   . Tremors of nervous system    Right    PAST SURGICAL HISTORY: Past Surgical History:  Procedure Laterality Date  . ADENOIDECTOMY    . broken leg    . TONSILLECTOMY      FAMILY HISTORY: Family History  Problem Relation Age of Onset  . Heart Problems Mother     SOCIAL HISTORY: Social History   Social History  . Marital status: Single    Spouse name: N/A  . Number of children: 0  . Years of education: GED   Occupational History  .  Unemployed    Disabled   Social History Main Topics  . Smoking status: Current Every Day Smoker    Packs/day: 0.50    Types: Cigarettes  . Smokeless tobacco: Never Used     Comment: Half Pack  . Alcohol use 0.5 oz/week    1 drink(s) per week     Comment: quit 2 years ago..   pt is now drinking on occ - very rare maybe once a month.  . Drug use:  No  . Sexual activity: Not on file   Other Topics Concern  . Not on file   Social History Narrative   Patient lives at home with Rogene Houston.   Patient does not have a significant other.    Patient is disabled.   Patient has no children.    Patient has his GED   Right handed.   Caffeine - four glasses and some sodas     PHYSICAL EXAM  Vitals:   10/14/16 1533  BP: 120/84  Pulse: 80  Weight: 173 lb (78.5 kg)  Height: 5\' 7"  (1.702 m)   Body mass index is 27.1 kg/m.  PHYSICAL EXAMNIATION:  Gen: NAD, conversant, well nourised, obese, well groomed                     Cardiovascular: Regular rate rhythm, no peripheral edema, warm, nontender. Eyes: Conjunctivae clear without exudates or hemorrhage Neck: Supple, no carotid bruits. Pulmonary: Clear to auscultation bilaterally   NEUROLOGICAL EXAM:  MENTAL  STATUS: Speech:    Speech is normal; fluent and spontaneous with normal comprehension.  Cognition:     Orientation to time, place and person     Normal recent and remote memory     Normal Attention span and concentration     Normal Language, naming, repeating,spontaneous speech     Fund of knowledge   CRANIAL NERVES: CN II: Visual fields are full to confrontation. Fundoscopic exam is normal with sharp discs and no vascular changes. Pupils are round equal and briskly reactive to light. CN III, IV, VI: extraocular movement are normal. No ptosis. CN V: Facial sensation is intact to pinprick in all 3 divisions bilaterally. Corneal responses are intact.  CN VII: Face is symmetric with normal eye closure and smile. CN VIII: Hearing is normal to rubbing fingers CN IX, X: Palate elevates symmetrically. Phonation is normal. CN XI: Head turning and shoulder shrug are intact CN XII: Tongue is midline with normal movements and no atrophy.  MOTOR: There is no pronator drift of out-stretched arms. Muscle bulk and tone are normal. Muscle strength is normal.  REFLEXES: Reflexes are 2+ and symmetric at the biceps, triceps, knees, and ankles. Plantar responses are flexor.  SENSORY: Intact to light touch, pinprick, positional and vibratory sensation are intact in fingers and toes.  COORDINATION: He has significant right finger to nose dysmetria, moderate heel-to-shin dysmetria,  GAIT/STANCE: He needs assistance to get up from seated position, dragging his right leg, depend on his cane,      DIAGNOSTIC DATA (LABS, IMAGING, TESTING) - I reviewed patient records, labs, notes, testing and imaging myself where available.  Lab Results  Component Value Date   WBC 8.7 07/08/2016   HGB 18.9 (H) 08/01/2014   HCT 54.0 (H) 07/08/2016   MCV 100 (H) 07/08/2016   PLT 288 07/08/2016      Component Value Date/Time   NA 144 07/08/2016 1457   K 4.2 07/08/2016 1457   CL 101 07/08/2016 1457   CO2 25  07/08/2016 1457   GLUCOSE 101 (H) 07/08/2016 1457   GLUCOSE 99 03/25/2014 2030   BUN 9 07/08/2016 1457   CREATININE 0.82 07/08/2016 1457   CALCIUM 9.3 07/08/2016 1457   PROT 6.5 07/08/2016 1457   ALBUMIN 4.3 07/08/2016 1457   AST 21 07/08/2016 1457   ALT 32 07/08/2016 1457   ALKPHOS 120 (H) 07/08/2016 1457   BILITOT 1.2 07/08/2016 1457   GFRNONAA 97 07/08/2016 1457   GFRAA 113  07/08/2016 1457     ASSESSMENT AND PLAN 61 y.o. year old male Relapsing remitting multiple sclerosis  Started Tysarbri infusion since February 2018, tolerated very well  Low JC virus titer 0.6 04 July 2016, repeat titer every 6 months,  Return to clinic with Eber Jones in 6 months  Repeat MRI of the brain in one year Vitamin D deficiency  Level was 7.1 in December 2017,  Continue vitamin D supplement,  May repeat level in one year   Levert Feinstein, M.D. Ph.D.  [facility] [address] [place], [place] [address] Phone: [phone] Fax:      [phone]

## 2017-04-09 NOTE — Progress Notes (Signed)
[facility]  PATIENT: Dale Edwards DOB: [birth year]   REASON FOR VISIT: Follow-up for relapsing remitting multiple sclerosis HISTORY FROM: Patient    HISTORY OF PRESENT ILLNESS:Follow-up of relapsing remitting multiple sclerosis,  He presented with right hand difficulty since 2010, he complains of right hand shaking, especially when he trying to use his right hand, such as holding a utensil, he has to hold his spoon like a child to prevent the food from spitting out, progressively worse over the past one year, he also noticed right leg shaking, especially when he bearing weight since 2013, mild gait difficulty dragging his right leg, he suffered a sudden fell to his right neck in 2007, with right tibial fracture,  He has chronic low back pain, but denies significant shooting pain from back to his lower extremity, he denies bilateral extremity paresthesia, he denies bowel and bladder  MRI brain 2014 showed mild-moderate periventricular and subcortical round and ovoid T2 hyperintensities, some of which are confluent.  MRI cervical 2014, spine At C2-C3, there is posterior central T2 hyperintense spinal cord lesion.  Above findings are consistent with multiple sclerosis.  He has no clear clinical relapsing and remitting, over the past few years, continue gradually worsening gait difficulty especially the right arm and the right leg difficulty.  Laboratory evaluation showed normal or negative vitamin B12, RPR, folic acid, ANA, HIV, C. reactive protein, TSH, CPK, Normal or negative Lyme titer, ACE level, hepatitis panel,  Spinal fluid testing showed WBC 1, glucose 44, total protein of 56, more than 5 oligoclonal banding, and increased IgG synthetic rate, increased IgG index,  He had a past medical history of chronic low back pain, right leg pain, knee pain, COPD, has been on disability since March 2014 due to physical difficulty, right side difficulty  He was  started on tecfidera since August 2014. He denies any side effects to the drug, he has not had any flushing. He denies any falls, he continues to have mild gait difficulty. He denies any loss of bowel or bladder control he has a history of chronic back pain and has been on disability for that.  UPDATE Jan 4th 2016: He tolerate the Tecfidera twice a day, today his main complaint is worsening low back pain, radiating pain to his left lower extremity, worsening gait difficulty, he fell occasionally MRI brain July 2015 at [facility]  1. Moderate periventricular and subcortical chronic demyelinating plaques. Some of these are confluent. Some are hypointense on T1 views. No acute plaques. 2. Moderate perisylvian and corpus callosal atrophy. 3. Compared to MRI from 01/22/13 there is no change. JC virus was positive with titer of 0.8 4 in August 01 2014 UPDATE December 28 2014: He is with his wife at today's clinical visit, he continues to complain low back pain, going down both legs, gait difficulty, increased bilateral hands, leg tremor, right worse than left, He is taking Percocet 10/325 mg 5-6 tablets, Valium 10 mg 3 times daily from primary care Dr. Mikeal Hawthorne.We have reviewed his MRI scans in May 2016 MRI of the brain, confluent periventricular chronic MS lesions, no contrast enhancement, no change compared to previous scan in July 2015 MRI of cervical, posterior ill-defined C2-3 noncontrast enhancing lesion, no change compared to previous scan in June 2014 MRI of lumbar, multilevel degenerative disc disease, most severe at L3-4, L4-5, mild foraminal stenosis, no significant canal stenosis  UPDATE Jul 08 2016: He complains of increased right arm/leg shaking, increased gait abnormality, continue to get worse,  he has no bowel and bladder incontinence.  He does have urinary urgency.    He has significant right hand dysmetria, slow worsening gait abnormality, I personally reviewed MRI of the brain in  May 2016, multiple periventricular MS lesions, no change compared to 2015, MRI cervical lesions, ill defined spinal cord hyperintensity lesion at C2-3, likely chronic demyelinating plaque, MRI lumbar multilevel degenerative disc diseasesignificant foraminal canal stenosis.  After discuss with patient we decided to proceed with Tysarbri infusion, potential side effect explained  Update October 14 2016:  JC virus titer was 0.67 in December 2017, he was switched to [place] infusion, the first one was in February 2018, will have repeat infusion in October 15 2016. He tolerated the infusion are well, no significant side effect noticed. He can walk better, feels more energetic  I reviewed laboratory evaluations, in December 2017, low vitamin D 7.1, varicella-zoster IgG 2396, CBC showed elevated hemoglobin 7.9, normal CPK, CMP,  I have personally reviewed MRI of brain with and without contrast in December, in comparison to previous scan in May 2016, moderate supratentorium lesions noted, no contrast enhancement no significant changes.  UPDATE 09/13/2018CM Mr. Balent, 61 year old male returns for follow-up for relapsing remitting multiple sclerosis. He is currently on Tysabri since February 2018 doing well on the infusion,  walking has improved. He denies any significant side effects most recent MRI of the brain in December 2017 without significant change from previous 2016. We will get repeat MRI of the brain December 2018. Denies any falls. Uses single-point cane. He continues to have right hand dysmetria. He returns for reevaluation   REVIEW OF SYSTEMS: Full 14 system review of systems performed and notable only for those listed, all others are neg:  Constitutional: neg  Cardiovascular: neg Ear/Nose/Throat: Hearing loss  Skin: neg Eyes: neg Respiratory: neg Gastroitestinal: neg  Hematology/Lymphatic: neg  Endocrine: neg Musculoskeletal: Walking difficulty Allergy/Immunology:  neg Neurological: neg Psychiatric: neg Sleep : neg   ALLERGIES: Allergies  Allergen Reactions  . Hydrocodone-Acetaminophen Itching and Nausea Only         HOME MEDICATIONS: Outpatient Medications Prior to Visit  Medication Sig Dispense Refill  . DULoxetine (CYMBALTA) 60 MG capsule Take 1 capsule (60 mg total) by mouth daily. 30 capsule 12  . UNABLE TO FIND Med Name:  Oxygen 2L/[place] at night.    Marland Kitchen albuterol (PROVENTIL HFA;VENTOLIN HFA) 108 (90 BASE) MCG/ACT inhaler Inhale 2 puffs into the lungs every 6 (six) hours as needed.    . cholecalciferol (VITAMIN D) 1000 units tablet Take 1,000 Units by mouth daily.    . Dimethyl Fumarate 240 MG CPDR Take 1 capsule (240 mg total) by mouth 2 (two) times daily. (Patient not taking: Reported on 04/10/2017) 60 capsule 11   No facility-administered medications prior to visit.     PAST MEDICAL HISTORY: Past Medical History:  Diagnosis Date  . Back pain   . COPD (chronic obstructive pulmonary disease) (HCC)   . Multiple sclerosis (HCC)   . Tremors of nervous system    Right    PAST SURGICAL HISTORY: Past Surgical History:  Procedure Laterality Date  . ADENOIDECTOMY    . broken leg    . TONSILLECTOMY      FAMILY HISTORY: Family History  Problem Relation Age of Onset  . Heart Problems Mother     SOCIAL HISTORY: Social History   Social History  . Marital status: Single    Spouse name: N/A  . Number of children: 0  . Years of  education: GED   Occupational History  .  Unemployed    Disabled   Social History Main Topics  . Smoking status: Current Every Day Smoker    Packs/day: 0.50    Types: Cigarettes  . Smokeless tobacco: Never Used     Comment: Half Pack  . Alcohol use 0.5 oz/week    1 Standard drinks or equivalent per week     Comment: quit 2 years ago..   pt is now drinking on occ - very rare maybe once a month.  . Drug use: No  . Sexual activity: Not on file   Other Topics Concern  . Not on file   Social  History Narrative   Patient lives at home with Rogene Houston.   Patient does not have a significant other.    Patient is disabled.   Patient has no children.    Patient has his GED   Right handed.   Caffeine - four glasses and some sodas     PHYSICAL EXAM  Vitals:   04/10/17 1547  BP: 100/72  Pulse: 95  Weight: 167 lb 6.4 oz (75.9 kg)   Body mass index is 26.22 kg/m.  Generalized: Well developed, in no acute distress  Head: normocephalic and atraumatic,. Oropharynx benign  Neck: Supple, no carotid bruits  Cardiac: Regular rate rhythm, no murmur  Musculoskeletal: No deformity   Neurological examination   Mentation: Alert oriented to time, place, history taking. Attention span and concentration appropriate. Recent and remote memory intact.  Follows all commands speech and language fluent.   Cranial nerve II-XII: Fundoscopic exam reveals sharp disc margins.Pupils were equal round reactive to light extraocular movements were full, visual field were full on confrontational test. Facial sensation and strength were normal. hearing was intact to finger rubbing bilaterally. Uvula tongue midline. head turning and shoulder shrug were normal and symmetric.Tongue protrusion into cheek strength was normal. Motor: normal bulk and tone, full strength in the BUE, BLE, fine finger movements normal, no pronator drift. No focal weakness Sensory: normal and symmetric to light touch, pinprick, and  Vibration in the upper and lower extremities  Coordination: finger-nose-finger, with significant dysmetria heel-to-shin bilaterally, moderate dysmetria  Reflexes: Brachioradialis 2/2, biceps 2/2, triceps 2/2, patellar 2/2, Achilles 2/2, plantar responses were flexor bilaterally. Gait and Station: Rising up from seated position without assistance, ambulated short distance in the hall, drags his right leg,  dependent on cane  DIAGNOSTIC DATA (LABS, IMAGING, TESTING) - I reviewed patient records, labs,  notes, testing and imaging myself where available.  Lab Results  Component Value Date   WBC 8.7 07/08/2016   HGB 17.9 (H) 07/08/2016   HCT 54.0 (H) 07/08/2016   MCV 100 (H) 07/08/2016   PLT 288 07/08/2016      Component Value Date/Time   NA 144 07/08/2016 1457   K 4.2 07/08/2016 1457   CL 101 07/08/2016 1457   CO2 25 07/08/2016 1457   GLUCOSE 101 (H) 07/08/2016 1457   GLUCOSE 99 03/25/2014 2030   BUN 9 07/08/2016 1457   CREATININE 0.82 07/08/2016 1457   CALCIUM 9.3 07/08/2016 1457   PROT 6.5 07/08/2016 1457   ALBUMIN 4.3 07/08/2016 1457   AST 21 07/08/2016 1457   ALT 32 07/08/2016 1457   ALKPHOS 120 (H) 07/08/2016 1457   BILITOT 1.2 07/08/2016 1457   GFRNONAA 97 07/08/2016 1457   GFRAA 113 07/08/2016 1457   Lab Results  Component Value Date   CHOL 231 (H) 01/08/2013   HDL 32 (  L) 01/08/2013   LDLCALC 146 (H) 01/08/2013   TRIG 267 (H) 01/08/2013   CHOLHDL 7.2 (H) 01/08/2013      ASSESSMENT AND PLAN  61 year old with relapsing remitting multiple sclerosis who started Tysabri February 2018 and has tolerated this well. Low JC virus titer 0.6 07/04/2016. Vitamin D deficiency  PLAN: Continue Tysabri infusions monthly CBC CMP today along with JC virus titer today Repeat MRI of the brain in December 2018 Take vitamin D supplement Follow-up in 6 months I spent 25 minutes in total face to face time with the patient more than 50% of which was spent counseling and coordination of care, reviewing test results reviewing medications and discussing and reviewing the diagnosis of multiple sclerosis and Vitamin D deficiency importance of adherence with the medications which she has not been taking , Nilda Riggs, [facility], [facility], APRN  [facility] [address], [address] [place], [place] [address] [phone]

## 2017-04-10 ENCOUNTER — Encounter: Payer: Self-pay | Admitting: Nurse Practitioner

## 2017-04-10 ENCOUNTER — Ambulatory Visit (INDEPENDENT_AMBULATORY_CARE_PROVIDER_SITE_OTHER): Payer: Medicare Other | Admitting: Nurse Practitioner

## 2017-04-10 VITALS — BP 100/72 | HR 95 | Wt 167.4 lb

## 2017-04-10 DIAGNOSIS — Z5181 Encounter for therapeutic drug level monitoring: Secondary | ICD-10-CM

## 2017-04-10 DIAGNOSIS — E559 Vitamin D deficiency, unspecified: Secondary | ICD-10-CM | POA: Diagnosis not present

## 2017-04-10 DIAGNOSIS — R269 Unspecified abnormalities of gait and mobility: Secondary | ICD-10-CM

## 2017-04-10 DIAGNOSIS — G35 Multiple sclerosis: Secondary | ICD-10-CM

## 2017-04-10 NOTE — Patient Instructions (Addendum)
Continue Tysabri infusions monthly CBC CMP today along with JC virus titer Repeat MRI of the brain in December 2018 Take vitamin D supplement Follow-up in 6 months  Vitamin D Deficiency Vitamin D deficiency is when your body does not have enough vitamin D. Vitamin D is important because:  It helps your body use other minerals that your body needs.  It helps keep your bones strong and healthy.  It may help to prevent some diseases.  It helps your heart and other muscles work well.  You can get vitamin D by:  Eating foods with vitamin D in them.  Drinking or eating milk or other foods that have had vitamin D added to them.  Taking a vitamin D supplement.  Being in the sun.  Not getting enough vitamin D can make your bones become soft. It can also cause other health problems. Follow these instructions at home:  Take medicines and supplements only as told by your doctor.  Eat foods that have vitamin D. These include: ? Dairy products, cereals, or juices with added vitamin D. Check the label for vitamin D. ? Fatty fish like salmon or trout. ? Eggs. ? Oysters.  Do not use tanning beds.  Stay at a healthy weight. Lose weight, if needed.  Keep all follow-up visits as told by your doctor. This is important. Contact a doctor if:  Your symptoms do not go away.  You feel sick to your stomach (nauseous).  Youthrow up (vomit).  You poop less often than usual or you have trouble pooping (constipation). This information is not intended to replace advice given to you by your health care provider. Make sure you discuss any questions you have with your health care provider. Document Released: 07/04/2011 Document Revised: 12/21/2015 Document Reviewed: 11/30/2014 Elsevier Interactive Patient Education  [employer].

## 2017-04-11 LAB — COMPREHENSIVE METABOLIC PANEL
ALT: 17 IU/L (ref 0–44)
AST: 14 IU/L (ref 0–40)
Albumin/Globulin Ratio: 1.9 (ref 1.2–2.2)
Albumin: 4.2 g/dL (ref 3.5–5.5)
Alkaline Phosphatase: 92 IU/L (ref 39–117)
BILIRUBIN TOTAL: 0.7 mg/dL (ref 0.0–1.2)
BUN / CREAT RATIO: 11 (ref 9–20)
BUN: 9 mg/dL (ref 6–24)
CO2: 25 mmol/L (ref 20–29)
CREATININE: 0.83 mg/dL (ref 0.76–1.27)
Calcium: 9.3 mg/dL (ref 8.7–10.2)
Chloride: 102 mmol/L (ref 96–106)
GFR calc Af Amer: 111 mL/min/{1.73_m2} (ref >59)
GFR calc non Af Amer: 96 mL/min/{1.73_m2} (ref >59)
Globulin, Total: 2.2 g/dL (ref 1.5–4.5)
Glucose: 89 mg/dL (ref 65–99)
Potassium: 5 mmol/L (ref 3.5–5.2)
Sodium: 142 mmol/L (ref 134–144)
Total Protein: 6.4 g/dL (ref 6.0–8.5)

## 2017-04-11 LAB — CBC WITH DIFFERENTIAL/PLATELET
BASOS: 1 %
Basophils Absolute: 0 10*3/uL (ref 0.0–0.2)
EOS (ABSOLUTE): 0.3 10*3/uL (ref 0.0–0.4)
Eos: 3 %
HEMATOCRIT: 46.4 % (ref 37.5–51.0)
Hemoglobin: 16 g/dL (ref 13.0–17.7)
IMMATURE GRANULOCYTES: 1 %
Immature Grans (Abs): 0.1 10*3/uL (ref 0.0–0.1)
Lymphocytes Absolute: 2 10*3/uL (ref 0.7–3.1)
Lymphs: 24 %
MCH: 31.2 pg (ref 26.6–33.0)
MCHC: 34.5 g/dL (ref 31.5–35.7)
MCV: 90 fL (ref 79–97)
Monocytes Absolute: 0.8 10*3/uL (ref 0.1–0.9)
Monocytes: 9 %
NEUTROS PCT: 62 %
Neutrophils Absolute: 5.2 10*3/uL (ref 1.4–7.0)
Platelets: 260 10*3/uL (ref 150–379)
RBC: 5.13 x10E6/uL (ref 4.14–5.80)
RDW: 13.9 % (ref 12.3–15.4)
WBC: 8.3 10*3/uL (ref 3.4–10.8)

## 2017-04-14 ENCOUNTER — Telehealth: Payer: Self-pay

## 2017-04-14 NOTE — Telephone Encounter (Signed)
-----   Message from Nilda Riggs, NP sent at 04/14/2017 12:06 PM EDT ----- Labs stable please call the patient

## 2017-04-14 NOTE — Telephone Encounter (Signed)
Pt called me back, I advised him that his labs are stable, per Dale Jones, NP. Pt verbalized understanding of results. Pt had no questions at this time but was encouraged to call back if questions arise.

## 2017-04-14 NOTE — Telephone Encounter (Signed)
I called pt to discuss his lab work. A woman answered the phone, advised me that the pt is indisposed right now, and the call was disconnected. I will call pt again later.

## 2017-04-14 NOTE — Progress Notes (Signed)
I have reviewed and agreed above plan. 

## 2017-04-15 ENCOUNTER — Telehealth: Payer: Self-pay | Admitting: *Deleted

## 2017-04-15 ENCOUNTER — Other Ambulatory Visit: Payer: Self-pay | Admitting: *Deleted

## 2017-04-15 ENCOUNTER — Other Ambulatory Visit (INDEPENDENT_AMBULATORY_CARE_PROVIDER_SITE_OTHER): Payer: Self-pay

## 2017-04-15 DIAGNOSIS — G35 Multiple sclerosis: Secondary | ICD-10-CM

## 2017-04-15 DIAGNOSIS — Z0289 Encounter for other administrative examinations: Secondary | ICD-10-CM

## 2017-04-15 NOTE — Telephone Encounter (Signed)
JCV drawn on 04/10/17 was placed in pick up box for [facility].  They never picked the samples up and they are no longer good for processing.  The patient will come in today for repeat lab.  Orders placed in Epic.

## 2017-05-14 ENCOUNTER — Telehealth: Payer: Self-pay | Admitting: *Deleted

## 2017-05-14 NOTE — Telephone Encounter (Signed)
[facility] called back. I took call for Marcelino DusterMichelle, Charity fundraiserN. Spoke with Gaylyn LambertMary Ellen. She apologized, stated they are going to escalate order and find out why index value not reported. She was able to reach out and get a verbal for index value which was: 0.58.   She stated when antibody with inhibition is ordered, this includes index value. This should be included each time. She is not sure why this was not reported. She apologized.

## 2017-05-14 NOTE — Telephone Encounter (Addendum)
Sunday Corn [phone] returned RN's call. She has reached out to local lab and said they can give a verbal result then you will have that information and the pt won't have to be stuck again. Please call to discuss after 2:00 today and before 5:30

## 2017-05-14 NOTE — Telephone Encounter (Signed)
Collected 04/15/17:  JCV Positive - no index value included although faxed order specifically requested it to be included in the result.  Called [facility] - they pulled his original order and stated they made an error in processing his labs.  They are unable to correct the problem and the patient will need to have his labs redrawn again. He has a pending appt for his Tysabri appointment on 06/10/17 and will have his JCV drawn again that day.  Orders placed in Epic.  Inetta Fermo in [place] has also made a note on his appt date to have him stop by the lab.

## 2017-07-08 ENCOUNTER — Other Ambulatory Visit: Payer: Self-pay | Admitting: Neurology

## 2017-10-14 ENCOUNTER — Ambulatory Visit: Payer: Medicare Other | Admitting: Neurology

## 2017-10-14 ENCOUNTER — Telehealth: Payer: Self-pay | Admitting: *Deleted

## 2017-10-14 NOTE — Telephone Encounter (Signed)
No showed follow up appointment. 

## 2017-10-15 ENCOUNTER — Encounter: Payer: Self-pay | Admitting: Neurology

## 2017-10-16 ENCOUNTER — Ambulatory Visit: Payer: Medicare Other | Admitting: Nurse Practitioner

## 2017-10-29 NOTE — Progress Notes (Signed)
[facility]  PATIENT: Dale Edwards DOB: [birth year]   REASON FOR VISIT: Follow-up for relapsing remitting multiple sclerosis HISTORY FROM: Patient    HISTORY OF PRESENT ILLNESS:Follow-up of relapsing remitting multiple sclerosis,  He presented with right hand difficulty since 2010, he complains of right hand shaking, especially when he trying to use his right hand, such as holding a utensil, he has to hold his spoon like a child to prevent the food from spitting out, progressively worse over the past one year, he also noticed right leg shaking, especially when he bearing weight since 2013, mild gait difficulty dragging his right leg, he suffered a sudden fell to his right neck in 2007, with right tibial fracture,  He has chronic low back pain, but denies significant shooting pain from back to his lower extremity, he denies bilateral extremity paresthesia, he denies bowel and bladder  MRI brain 2014 showed mild-moderate periventricular and subcortical round and ovoid T2 hyperintensities, some of which are confluent.  MRI cervical 2014, spine At C2-C3, there is posterior central T2 hyperintense spinal cord lesion.  Above findings are consistent with multiple sclerosis.  He has no clear clinical relapsing and remitting, over the past few years, continue gradually worsening gait difficulty especially the right arm and the right leg difficulty.  Laboratory evaluation showed normal or negative vitamin B12, RPR, folic acid, ANA, HIV, C. reactive protein, TSH, CPK, Normal or negative Lyme titer, ACE level, hepatitis panel,  Spinal fluid testing showed WBC 1, glucose 44, total protein of 56, more than 5 oligoclonal banding, and increased IgG synthetic rate, increased IgG index,  He had a past medical history of chronic low back pain, right leg pain, knee pain, COPD, has been on disability since March 2014 due to physical difficulty, right side difficulty  He was  started on tecfidera since August 2014. He denies any side effects to the drug, he has not had any flushing. He denies any falls, he continues to have mild gait difficulty. He denies any loss of bowel or bladder control he has a history of chronic back pain and has been on disability for that.  UPDATE Jan 4th 2016: He tolerate the Tecfidera twice a day, today his main complaint is worsening low back pain, radiating pain to his left lower extremity, worsening gait difficulty, he fell occasionally MRI brain July 2015 at [facility]  1. Moderate periventricular and subcortical chronic demyelinating plaques. Some of these are confluent. Some are hypointense on T1 views. No acute plaques. 2. Moderate perisylvian and corpus callosal atrophy. 3. Compared to MRI from 01/22/13 there is no change. JC virus was positive with titer of 0.8 4 in August 01 2014 UPDATE December 28 2014: He is with his wife at today's clinical visit, he continues to complain low back pain, going down both legs, gait difficulty, increased bilateral hands, leg tremor, right worse than left, He is taking Percocet 10/325 mg 5-6 tablets, Valium 10 mg 3 times daily from primary care Dr. Mikeal Hawthorne.We have reviewed his MRI scans in May 2016 MRI of the brain, confluent periventricular chronic MS lesions, no contrast enhancement, no change compared to previous scan in July 2015 MRI of cervical, posterior ill-defined C2-3 noncontrast enhancing lesion, no change compared to previous scan in June 2014 MRI of lumbar, multilevel degenerative disc disease, most severe at L3-4, L4-5, mild foraminal stenosis, no significant canal stenosis  UPDATE Jul 08 2016: He complains of increased right arm/leg shaking, increased gait abnormality, continue to get worse,  he has no bowel and bladder incontinence.  He does have urinary urgency.    He has significant right hand dysmetria, slow worsening gait abnormality, I personally reviewed MRI of the brain in  May 2016, multiple periventricular MS lesions, no change compared to 2015, MRI cervical lesions, ill defined spinal cord hyperintensity lesion at C2-3, likely chronic demyelinating plaque, MRI lumbar multilevel degenerative disc diseasesignificant foraminal canal stenosis.  After discuss with patient we decided to proceed with Tysarbri infusion, potential side effect explained  Update October 14 2016:  JC virus titer was 0.67 in December 2017, he was switched to [place] infusion, the first one was in February 2018, will have repeat infusion in October 15 2016. He tolerated the infusion are well, no significant side effect noticed. He can walk better, feels more energetic  I reviewed laboratory evaluations, in December 2017, low vitamin D 7.1, varicella-zoster IgG 2396, CBC showed elevated hemoglobin 7.9, normal CPK, CMP,  I have personally reviewed MRI of brain with and without contrast in December, in comparison to previous scan in May 2016, moderate supratentorium lesions noted, no contrast enhancement  No significant changes.  UPDATE 09/13/2018CM [name], 62 year old male returns for follow-up for relapsing remitting multiple sclerosis. He is currently on Tysabri since February 2018 doing well on the infusion,  walking has improved. He denies any significant side effects most recent MRI of the brain in December 2017 without significant change from previous 2016. We will get repeat MRI of the brain December 2018. Denies any falls. Uses single-point cane. He continues to have right hand dysmetria. He returns for reevaluation UPDATE 4/4/2019CM [name], 62 year old male returns for follow-up with history of relapsing remitting multiple sclerosis.  He is currently on Tysabri since February 2018 doing well on the infusion and his walking has improved even though he continues to use a cane.  He continues to have right hand tremor.  Continues to have bil numbness ongoing for years.  He denies any  bowel or bladder incontinence he does have urinary urgency.  He denies any visual problems.  He returns for reevaluation  REVIEW OF SYSTEMS: Full 14 system review of systems performed and notable only for those listed, all others are neg:  Constitutional: neg  Cardiovascular: neg Ear/Nose/Throat: neg Skin: neg Eyes: neg Respiratory: neg Gastroitestinal: neg  Hematology/Lymphatic: neg  Endocrine: neg Musculoskeletal: Walking difficulty Allergy/Immunology: neg Neurological: Numbness and tremors without change Psychiatric: neg Sleep : neg   ALLERGIES: Allergies  Allergen Reactions  . Hydrocodone-Acetaminophen Itching and Nausea Only         HOME MEDICATIONS: Outpatient Medications Prior to Visit  Medication Sig Dispense Refill  . albuterol (PROVENTIL HFA;VENTOLIN HFA) 108 (90 BASE) MCG/ACT inhaler Inhale 2 puffs into the lungs every 6 (six) hours as needed.    . cholecalciferol (VITAMIN D) 1000 units tablet Take 1,000 Units by mouth daily.    . TYSABRI 300 MG/15ML injection monthly    . UNABLE TO FIND Med Name:  Oxygen 2L/[place] at night.    . Dimethyl Fumarate 240 MG CPDR Take 1 capsule (240 mg total) by mouth 2 (two) times daily. (Patient not taking: Reported on 10/30/2017) 60 capsule 11  . DULoxetine (CYMBALTA) 60 MG capsule Take 1 capsule (60 mg total) by mouth daily. (Patient not taking: Reported on 10/30/2017) 30 capsule 12   No facility-administered medications prior to visit.     PAST MEDICAL HISTORY: Past Medical History:  Diagnosis Date  . Back pain   . COPD (chronic  obstructive pulmonary disease) (HCC)   . Multiple sclerosis (HCC)   . Tremors of nervous system    Right    PAST SURGICAL HISTORY: Past Surgical History:  Procedure Laterality Date  . ADENOIDECTOMY    . broken leg    . TONSILLECTOMY      FAMILY HISTORY: Family History  Problem Relation Age of Onset  . Heart Problems Mother     SOCIAL HISTORY: Social History   Socioeconomic History  .  Marital status: Single    Spouse name: Not on file  . Number of children: 0  . Years of education: GED  . Highest education level: Not on file  Occupational History    Employer: UNEMPLOYED    Comment: Disabled  Social Needs  . Financial resource strain: Not on file  . Food insecurity:    Worry: Not on file    Inability: Not on file  . Transportation needs:    Medical: Not on file    Non-medical: Not on file  Tobacco Use  . Smoking status: Current Every Day Smoker    Packs/day: 0.50    Types: Cigarettes  . Smokeless tobacco: Never Used  . Tobacco comment: Half Pack  Substance and Sexual Activity  . Alcohol use: Yes    Alcohol/week: 0.5 oz    Types: 1 Standard drinks or equivalent per week    Comment: quit 2 years ago..   pt is now drinking on occ - very rare maybe once a month.  . Drug use: No  . Sexual activity: Not on file  Lifestyle  . Physical activity:    Days per week: Not on file    Minutes per session: Not on file  . Stress: Not on file  Relationships  . Social connections:    Talks on phone: Not on file    Gets together: Not on file    Attends religious service: Not on file    Active member of club or organization: Not on file    Attends meetings of clubs or organizations: Not on file    Relationship status: Not on file  . Intimate partner violence:    Fear of current or ex partner: Not on file    Emotionally abused: Not on file    Physically abused: Not on file    Forced sexual activity: Not on file  Other Topics Concern  . Not on file  Social History Narrative   Patient lives at home with Rogene [place].   Patient does not have a significant other.    Patient is disabled.   Patient has no children.    Patient has his GED   Right handed.   Caffeine - four glasses and some sodas     PHYSICAL EXAM  Vitals:   10/30/17 1233  BP: 125/78  Pulse: 87  Weight: 185 lb 3.2 oz (84 kg)  Height: 5\' 7"  (1.702 m)   Body mass index is 29.01  kg/m.  Generalized: Well developed, in no acute distress  Head: normocephalic and atraumatic,. Oropharynx benign  Neck: Supple, no carotid bruits  Cardiac: Regular rate rhythm, no murmur  Musculoskeletal: No deformity   Neurological examination   Mentation: Alert oriented to time, place, history taking. Attention span and concentration appropriate. Recent and remote memory intact.  Follows all commands speech and language fluent.   Cranial nerve II-XII: Fundoscopic exam reveals sharp disc margins.Pupils were equal round reactive to light extraocular movements were full, visual field were full on confrontational  test. Facial sensation and strength were normal. hearing was intact to finger rubbing bilaterally. Uvula tongue midline. head turning and shoulder shrug were normal and symmetric.Tongue protrusion into cheek strength was normal. Motor: normal bulk and tone, full strength in the BUE, BLE, fine finger movements normal, no pronator drift. No focal weakness Sensory: normal and symmetric to light touch, pinprick, and  Vibration in the upper and lower extremities  Coordination: finger-nose-finger, with significant dysmetria heel-to-shin bilaterally, moderate dysmetria  Reflexes: Brachioradialis 2/2, biceps 2/2, triceps 2/2, patellar 2/2, Achilles 2/2, plantar responses were flexor bilaterally. Gait and Station: Rising up from seated position without assistance, ambulated short distance in the hall, drags his right leg,  dependent on cane , tandem gait unsteady DIAGNOSTIC DATA (LABS, IMAGING, TESTING) - I reviewed patient records, labs, notes, testing and imaging myself where available.  Lab Results  Component Value Date   WBC 8.3 04/10/2017   HGB 16.0 04/10/2017   HCT 46.4 04/10/2017   MCV 90 04/10/2017   PLT 260 04/10/2017      Component Value Date/Time   NA 142 04/10/2017 1611   K 5.0 04/10/2017 1611   CL 102 04/10/2017 1611   CO2 25 04/10/2017 1611   GLUCOSE 89 04/10/2017 1611    GLUCOSE 99 03/25/2014 2030   BUN 9 04/10/2017 1611   CREATININE 0.83 04/10/2017 1611   CALCIUM 9.3 04/10/2017 1611   PROT 6.4 04/10/2017 1611   ALBUMIN 4.2 04/10/2017 1611   AST 14 04/10/2017 1611   ALT 17 04/10/2017 1611   ALKPHOS 92 04/10/2017 1611   BILITOT 0.7 04/10/2017 1611   GFRNONAA 96 04/10/2017 1611   GFRAA 111 04/10/2017 1611   Lab Results  Component Value Date   CHOL 231 (H) 01/08/2013   HDL 32 (L) 01/08/2013   LDLCALC 146 (H) 01/08/2013   TRIG 267 (H) 01/08/2013   CHOLHDL 7.2 (H) 01/08/2013      ASSESSMENT AND PLAN  62 year old with relapsing remitting multiple sclerosis who started Tysabri February 2018 and has tolerated this well. Low JC virus titer 0.6 07/04/2016. Vitamin D deficiency.   PLAN: Continue Tysabri infusions monthly CBC CMP today along with JC virus titer today  MRI of the brain  and compare to 2017 Take vitamin D supplement as directed  Follow-up in 3months I spent 25 minutes in total face to face time with the patient more than 50% of which was spent counseling and coordination of care, reviewing test results reviewing medications and discussing and reviewing the diagnosis of multiple sclerosis and Vitamin D deficiency importance of adherence with the medications.  Also discussed close MRI follow-up which is necessary to decide on treatment., Cline Crock, [facility], APRN  [facility] [address] [place], [place] [address] [phone]

## 2017-10-30 ENCOUNTER — Encounter: Payer: Self-pay | Admitting: Nurse Practitioner

## 2017-10-30 ENCOUNTER — Ambulatory Visit (INDEPENDENT_AMBULATORY_CARE_PROVIDER_SITE_OTHER): Payer: Medicare Other | Admitting: Nurse Practitioner

## 2017-10-30 ENCOUNTER — Telehealth: Payer: Self-pay | Admitting: Nurse Practitioner

## 2017-10-30 VITALS — BP 125/78 | HR 87 | Ht 67.0 in | Wt 185.2 lb

## 2017-10-30 DIAGNOSIS — R269 Unspecified abnormalities of gait and mobility: Secondary | ICD-10-CM | POA: Diagnosis not present

## 2017-10-30 DIAGNOSIS — G35 Multiple sclerosis: Secondary | ICD-10-CM

## 2017-10-30 DIAGNOSIS — Z5181 Encounter for therapeutic drug level monitoring: Secondary | ICD-10-CM

## 2017-10-30 DIAGNOSIS — E559 Vitamin D deficiency, unspecified: Secondary | ICD-10-CM

## 2017-10-30 DIAGNOSIS — R251 Tremor, unspecified: Secondary | ICD-10-CM | POA: Insufficient documentation

## 2017-10-30 NOTE — Progress Notes (Signed)
I have reviewed and agreed above plan. 

## 2017-10-30 NOTE — Patient Instructions (Signed)
Continue Tysabri infusions monthly CBC CMP today along with JC virus titer today  MRI of the brain  And compare to 2017 Take vitamin D supplement

## 2017-10-30 NOTE — Telephone Encounter (Signed)
[employer] medicare/medicaid order sent to [facility] no auth they will reach out to the patient to schedule.  °

## 2017-10-31 LAB — CBC WITH DIFFERENTIAL/PLATELET
BASOS ABS: 0.1 10*3/uL (ref 0.0–0.2)
Basos: 1 %
EOS (ABSOLUTE): 0.2 10*3/uL (ref 0.0–0.4)
Eos: 3 %
HEMATOCRIT: 51.2 % — AB (ref 37.5–51.0)
Hemoglobin: 17.7 g/dL (ref 13.0–17.7)
Immature Grans (Abs): 0.1 10*3/uL (ref 0.0–0.1)
Immature Granulocytes: 1 %
Lymphocytes Absolute: 2.3 10*3/uL (ref 0.7–3.1)
Lymphs: 27 %
MCH: 32.5 pg (ref 26.6–33.0)
MCHC: 34.6 g/dL (ref 31.5–35.7)
MCV: 94 fL (ref 79–97)
Monocytes Absolute: 0.7 10*3/uL (ref 0.1–0.9)
Monocytes: 9 %
Neutrophils Absolute: 5.1 10*3/uL (ref 1.4–7.0)
Neutrophils: 59 %
Platelets: 247 10*3/uL (ref 150–379)
RBC: 5.44 x10E6/uL (ref 4.14–5.80)
RDW: 14.3 % (ref 12.3–15.4)
WBC: 8.5 10*3/uL (ref 3.4–10.8)

## 2017-10-31 LAB — COMPREHENSIVE METABOLIC PANEL
A/G RATIO: 1.8 (ref 1.2–2.2)
ALBUMIN: 4.3 g/dL (ref 3.5–5.5)
ALT: 28 IU/L (ref 0–44)
AST: 21 IU/L (ref 0–40)
Alkaline Phosphatase: 88 IU/L (ref 39–117)
BUN / CREAT RATIO: 9 (ref 9–20)
BUN: 9 mg/dL (ref 6–24)
Bilirubin Total: 1.1 mg/dL (ref 0.0–1.2)
CO2: 24 mmol/L (ref 20–29)
Calcium: 9.3 mg/dL (ref 8.7–10.2)
Chloride: 99 mmol/L (ref 96–106)
Creatinine, Ser: 1 mg/dL (ref 0.76–1.27)
GFR calc Af Amer: 95 mL/min/{1.73_m2} (ref >59)
GFR calc non Af Amer: 82 mL/min/{1.73_m2} (ref >59)
GLUCOSE: 95 mg/dL (ref 65–99)
Globulin, Total: 2.4 g/dL (ref 1.5–4.5)
Potassium: 4.5 mmol/L (ref 3.5–5.2)
Sodium: 140 mmol/L (ref 134–144)
TOTAL PROTEIN: 6.7 g/dL (ref 6.0–8.5)

## 2017-11-03 ENCOUNTER — Telehealth: Payer: Self-pay | Admitting: *Deleted

## 2017-11-03 NOTE — Telephone Encounter (Signed)
Spoke with patient and informed him his labs are stable. He asked if a vitamin d level was checked. This RN advised him it was not. He verbalized understanding, appreciation of call.

## 2017-11-06 ENCOUNTER — Other Ambulatory Visit: Payer: Medicare Other

## 2017-11-06 ENCOUNTER — Telehealth: Payer: Self-pay | Admitting: *Deleted

## 2017-11-06 NOTE — Telephone Encounter (Signed)
Dr. Terrace Arabia aware no changes

## 2017-11-06 NOTE — Telephone Encounter (Signed)
JCV positive 0.84 index.  (last JCV 0.67 positive 06/2016).

## 2017-11-08 ENCOUNTER — Ambulatory Visit
Admission: RE | Admit: 2017-11-08 | Discharge: 2017-11-08 | Disposition: A | Discharge status: Home / Self Care | Payer: Medicare Other | Source: Ambulatory Visit | Attending: Nurse Practitioner | Admitting: Nurse Practitioner

## 2017-11-08 DIAGNOSIS — G35 Multiple sclerosis: Secondary | ICD-10-CM

## 2017-11-08 MED ORDER — GADOBENATE DIMEGLUMINE 529 MG/ML IV SOLN
17.0000 mL | Freq: Once | INTRAVENOUS | Status: AC | PRN
  Administered 2017-11-08: 17 mL via INTRAVENOUS

## 2017-11-10 ENCOUNTER — Telehealth: Payer: Self-pay | Admitting: *Deleted

## 2017-11-10 NOTE — Telephone Encounter (Signed)
Pt returned call to [facility], he is asking for a call back

## 2017-11-10 NOTE — Telephone Encounter (Signed)
LMVM for pt to return call (no DPR).

## 2017-11-10 NOTE — Telephone Encounter (Signed)
-----   Message from Nilda Riggs, NP sent at 11/10/2017  8:23 AM EDT ----- MRI of the brain without significant change from 07/16/2016

## 2017-11-10 NOTE — Telephone Encounter (Signed)
LMVM for pt to return call for MRI test results.

## 2017-11-11 ENCOUNTER — Encounter: Payer: Self-pay | Admitting: *Deleted

## 2017-11-11 NOTE — Telephone Encounter (Signed)
I mailed result letter to pt.

## 2017-11-11 NOTE — Telephone Encounter (Signed)
Called and did not LM.

## 2018-01-19 ENCOUNTER — Ambulatory Visit: Payer: Medicare Other | Admitting: Neurology

## 2018-02-02 ENCOUNTER — Ambulatory Visit: Payer: Medicare Other | Admitting: Neurology

## 2018-04-13 ENCOUNTER — Encounter: Payer: Self-pay | Admitting: Neurology

## 2018-04-13 ENCOUNTER — Ambulatory Visit (INDEPENDENT_AMBULATORY_CARE_PROVIDER_SITE_OTHER): Payer: Medicare Other | Admitting: Neurology

## 2018-04-13 VITALS — BP 125/81 | HR 88 | Ht 66.5 in | Wt 190.0 lb

## 2018-04-13 DIAGNOSIS — R269 Unspecified abnormalities of gait and mobility: Secondary | ICD-10-CM | POA: Diagnosis not present

## 2018-04-13 DIAGNOSIS — G35 Multiple sclerosis: Secondary | ICD-10-CM | POA: Diagnosis not present

## 2018-04-13 NOTE — Progress Notes (Signed)
[facility]  PATIENT: Dale Edwards DOB: 1955-10-06  HISTORY OF PRESENT ILLNESS:Follow-up of relapsing remitting multiple sclerosis,  He presented with right hand difficulty since 2010, he complains of right hand shaking, especially when he trying to use his right hand, such as holding a utensil, he has to hold his spoon like a child to prevent the food from spitting out, progressively worse over the past one year, he also noticed right leg shaking, especially when he bearing weight since 2013, mild gait difficulty dragging his right leg, he suffered a sudden fell in 2007, with right tibial fracture,  He has chronic low back pain, but denies significant shooting pain from back to his lower extremity, he denies bilateral extremity paresthesia, he denies bowel and bladder  MRI brain 2014 showed mild-moderate periventricular and subcortical round and ovoid T2 hyperintensities, some of which are confluent.  MRI cervical 2014, spine At C2-C3, there is posterior central T2 hyperintense spinal cord lesion.  Findings are consistent with multiple sclerosis.  He has no clear clinical relapsing and remitting, over the past few years, continue gradually worsening gait difficulty especially the right arm and the right leg difficulty.  Laboratory evaluation showed normal or negative vitamin B12, RPR, folic acid, ANA, HIV, C. reactive protein, TSH, CPK, Normal or negative Lyme titer, ACE level, hepatitis panel,  Spinal fluid testing showed WBC 1, glucose 44, total protein of 56, more than 5 oligoclonal banding, and increased IgG synthetic rate, increased IgG index,  He had a past medical history of chronic low back pain, right leg pain, knee pain, COPD, has been on disability since March 2014 due to physical difficulty, right side difficulty  He was started on tecfidera since August 2014. He denies any side effects to the drug, he has not had any flushing. He denies any  falls, he continues to have mild gait difficulty. He denies any loss of bowel or bladder control he has a history of chronic back pain and has been on disability for that.   UPDATE Jan 4th 2016: He tolerate the Tecfidera twice a day, today his main complaint is worsening low back pain, radiating pain to his left lower extremity, worsening gait difficulty, he fell occasionally MRI brain July 2015 at [facility] imaging  1. Moderate periventricular and subcortical chronic demyelinating plaques. Some of these are confluent. Some are hypointense on T1 views. No acute plaques. 2. Moderate perisylvian and corpus callosal atrophy. 3. Compared to MRI from 01/22/13 there is no change. JC virus was positive with titer of 0.8 4 in August 01 2014 UPDATE December 28 2014: He is with his wife at today's clinical visit, he continues to complain low back pain, going down both legs, gait difficulty, increased bilateral hands, leg tremor, right worse than left, He is taking Percocet 10/325 mg 5-6 tablets, Valium 10 mg 3 times daily from primary care Dr. Mikeal Hawthorne.We have reviewed his MRI scans in May 2016 MRI of the brain, confluent periventricular chronic MS lesions, no contrast enhancement, no change compared to previous scan in July 2015 MRI of cervical, posterior ill-defined C2-3 noncontrast enhancing lesion, no change compared to previous scan in June 2014 MRI of lumbar, multilevel degenerative disc disease, most severe at L3-4, L4-5, mild foraminal stenosis, no significant canal stenosis  UPDATE Jul 08 2016: He complains of increased right arm/leg shaking, increased gait abnormality, continue to get worse, he has no bowel and bladder incontinence.  He does have urinary urgency.    He has significant right  hand dysmetria, slow worsening gait abnormality, I personally reviewed MRI of the brain in May 2016, multiple periventricular MS lesions, no change compared to 2015, MRI cervical lesions, ill defined spinal cord  hyperintensity lesion at C2-3, likely chronic demyelinating plaque, MRI lumbar multilevel degenerative disc diseasesignificant foraminal canal stenosis.  After discuss with patient we decided to proceed with Tysarbri infusion, potential side effect explained  Update October 14 2016:  JC virus titer was 0.67 in December 2017, he was switched to [place] infusion, the first one was in February 2018, will have repeat infusion in October 15 2016. He tolerated the infusion are well, no significant side effect noticed. He can walk better, feels more energetic  I reviewed laboratory evaluations, in December 2017, low vitamin D 7.1, varicella-zoster IgG 2396, CBC showed elevated hemoglobin 7.9, normal CPK, CMP,  I have personally reviewed MRI of brain with and without contrast in December, in comparison to previous scan in May 2016, moderate supratentorium lesions noted, no contrast enhancement  No significant changes.  UPDATE Apr 13 2018: JCV positive 0.84 index in April 2019.  (last JCV 0.67 positive 06/2016).  Normal CBC, CMP  He is tolerating Tysarbri infusion well, no significant side effect noted, continue have gait abnormality, We personally reviewed MRI of the brain with and without contrast in April 2019, multiple T2/flair hyperintensity bilateral hemisphere, deep gray matter, cerebellum, consistent with chronic demyelinating, there was no change compared to previous scan in December 2017, moderate generalized atrophy, no contrast-enhancement   REVIEW OF SYSTEMS: Full 14 system review of systems performed and notable only for those listed, all others are neg:  As above  ALLERGIES: Allergies  Allergen Reactions  . Hydrocodone-Acetaminophen Itching and Nausea Only         HOME MEDICATIONS: Outpatient Medications Prior to Visit  Medication Sig Dispense Refill  . albuterol (PROVENTIL HFA;VENTOLIN HFA) 108 (90 BASE) MCG/ACT inhaler Inhale 2 puffs into the lungs every 6 (six) hours as  needed.    . cholecalciferol (VITAMIN D) 1000 units tablet Take 1,000 Units by mouth daily.    . TYSABRI 300 MG/15ML injection monthly    . UNABLE TO FIND Med Name:  Oxygen 2L/[place] at night.     No facility-administered medications prior to visit.     PAST MEDICAL HISTORY: Past Medical History:  Diagnosis Date  . Back pain   . COPD (chronic obstructive pulmonary disease) (HCC)   . Multiple sclerosis (HCC)   . Tremors of nervous system    Right    PAST SURGICAL HISTORY: Past Surgical History:  Procedure Laterality Date  . ADENOIDECTOMY    . broken leg    . TONSILLECTOMY      FAMILY HISTORY: Family History  Problem Relation Age of Onset  . Heart Problems Mother     SOCIAL HISTORY: Social History   Socioeconomic History  . Marital status: Single    Spouse name: Not on file  . Number of children: 0  . Years of education: GED  . Highest education level: Not on file  Occupational History    Employer: UNEMPLOYED    Comment: Disabled  Social Needs  . Financial resource strain: Not on file  . Food insecurity:    Worry: Not on file    Inability: Not on file  . Transportation needs:    Medical: Not on file    Non-medical: Not on file  Tobacco Use  . Smoking status: Current Every Day Smoker    Packs/day: 0.50  Types: Cigarettes  . Smokeless tobacco: Never Used  . Tobacco comment: Half Pack  Substance and Sexual Activity  . Alcohol use: Yes    Alcohol/week: 1.0 standard drinks    Types: 1 Standard drinks or equivalent per week    Comment: quit 2 years ago..   pt is now drinking on occ - very rare maybe once a month.  . Drug use: No  . Sexual activity: Not on file  Lifestyle  . Physical activity:    Days per week: Not on file    Minutes per session: Not on file  . Stress: Not on file  Relationships  . Social connections:    Talks on phone: Not on file    Gets together: Not on file    Attends religious service: Not on file    Active member of club or  organization: Not on file    Attends meetings of clubs or organizations: Not on file    Relationship status: Not on file  . Intimate partner violence:    Fear of current or ex partner: Not on file    Emotionally abused: Not on file    Physically abused: Not on file    Forced sexual activity: Not on file  Other Topics Concern  . Not on file  Social History Narrative   Patient lives at home with Rogene Houston.   Patient does not have a significant other.    Patient is disabled.   Patient has no children.    Patient has his GED   Right handed.   Caffeine - four glasses and some sodas     PHYSICAL EXAM  Vitals:   04/13/18 1503  BP: 125/81  Pulse: 88  Weight: 190 lb (86.2 kg)  Height: 5' 6.5" (1.689 m)   Body mass index is 30.21 kg/m.  Generalized: Well developed, in no acute distress  Head: normocephalic and atraumatic,. Oropharynx benign  Neck: Supple, no carotid bruits  Cardiac: Regular rate rhythm, no murmur  Musculoskeletal: No deformity   Neurological examination   Mentation: Alert oriented to time, place, history taking. Attention span and concentration appropriate. Recent and remote memory intact.  Follows all commands speech and language fluent.   Cranial nerve II-XII: Pupils were equal round reactive to light extraocular movements were full, visual field were full on confrontational test. Facial sensation and strength were normal. hearing was intact to finger rubbing bilaterally. Uvula tongue midline. head turning and shoulder shrug were normal and symmetric.Tongue protrusion into cheek strength was normal. Motor: Mild fixation of left upper extremity on rapid rotating movement Sensory: normal and symmetric to light touch, pinprick, and  Vibration in the upper and lower extremities  Coordination: Significant right side finger-to-nose dysmetria, mild right heel-to-shin dysmetria, Reflexes: Brachioradialis 2/2, biceps 2/2, triceps 2/2, patellar 2/2, Achilles 2/2,  plantar responses were flexor bilaterally. Gait and Station: Need to push up to get up from seated position, wide-based, unsteady, rely on his cane DIAGNOSTIC DATA (LABS, IMAGING, TESTING) - I reviewed patient records, labs, notes, testing and imaging myself where available.  Lab Results  Component Value Date   WBC 8.5 10/30/2017   HGB 17.7 10/30/2017   HCT 51.2 (H) 10/30/2017   MCV 94 10/30/2017   PLT 247 10/30/2017      Component Value Date/Time   NA 140 10/30/2017 1311   K 4.5 10/30/2017 1311   CL 99 10/30/2017 1311   CO2 24 10/30/2017 1311   GLUCOSE 95 10/30/2017 1311   GLUCOSE  99 03/25/2014 2030   BUN 9 10/30/2017 1311   CREATININE 1.00 10/30/2017 1311   CALCIUM 9.3 10/30/2017 1311   PROT 6.7 10/30/2017 1311   ALBUMIN 4.3 10/30/2017 1311   AST 21 10/30/2017 1311   ALT 28 10/30/2017 1311   ALKPHOS 88 10/30/2017 1311   BILITOT 1.1 10/30/2017 1311   GFRNONAA 82 10/30/2017 1311   GFRAA 95 10/30/2017 1311   Lab Results  Component Value Date   CHOL 231 (H) 01/08/2013   HDL 32 (L) 01/08/2013   LDLCALC 146 (H) 01/08/2013   TRIG 267 (H) 01/08/2013   CHOLHDL 7.2 (H) 01/08/2013      ASSESSMENT AND PLAN Relapsing remitting multiple sclerosis   Tysabri since February 2018 and has tolerated this well. Low JC virus titer 0.84 April 2019  Repeat JC virus titer CBC, CMP, TSH today  Most recent repeat MRI of the brain with without contrast was in April 2019, there was no significant change compared to previous scan  Gait abnormality  Continue moderate exercise,  Write a prescription for 1 foot cane  Levert Feinstein, M.D. PhD.  [facility] [address], [address] [place], [place] [address] [phone]

## 2018-04-14 LAB — CBC WITH DIFFERENTIAL
BASOS: 1 %
Basophils Absolute: 0.1 10*3/uL (ref 0.0–0.2)
EOS (ABSOLUTE): 0.3 10*3/uL (ref 0.0–0.4)
EOS: 3 %
Hematocrit: 53.6 % — ABNORMAL HIGH (ref 37.5–51.0)
Hemoglobin: 18.4 g/dL — ABNORMAL HIGH (ref 13.0–17.7)
IMMATURE GRANULOCYTES: 1 %
Immature Grans (Abs): 0.1 10*3/uL (ref 0.0–0.1)
LYMPHS ABS: 2.2 10*3/uL (ref 0.7–3.1)
Lymphs: 24 %
MCH: 32.1 pg (ref 26.6–33.0)
MCHC: 34.3 g/dL (ref 31.5–35.7)
MCV: 94 fL (ref 79–97)
Monocytes Absolute: 0.6 10*3/uL (ref 0.1–0.9)
Monocytes: 7 %
NEUTROS PCT: 64 %
Neutrophils Absolute: 5.9 10*3/uL (ref 1.4–7.0)
RBC: 5.73 x10E6/uL (ref 4.14–5.80)
RDW: 14.5 % (ref 12.3–15.4)
WBC: 9.1 10*3/uL (ref 3.4–10.8)

## 2018-04-14 LAB — COMPREHENSIVE METABOLIC PANEL
ALBUMIN: 4.3 g/dL (ref 3.6–4.8)
ALT: 23 IU/L (ref 0–44)
AST: 17 IU/L (ref 0–40)
Albumin/Globulin Ratio: 2.2 (ref 1.2–2.2)
Alkaline Phosphatase: 109 IU/L (ref 39–117)
BUN/Creatinine Ratio: 9 — ABNORMAL LOW (ref 10–24)
BUN: 8 mg/dL (ref 8–27)
Bilirubin Total: 1.9 mg/dL — ABNORMAL HIGH (ref 0.0–1.2)
CO2: 23 mmol/L (ref 20–29)
CREATININE: 0.86 mg/dL (ref 0.76–1.27)
Calcium: 9.2 mg/dL (ref 8.6–10.2)
Chloride: 101 mmol/L (ref 96–106)
GFR calc Af Amer: 109 mL/min/{1.73_m2} (ref >59)
GFR calc non Af Amer: 94 mL/min/{1.73_m2} (ref >59)
GLUCOSE: 88 mg/dL (ref 65–99)
Globulin, Total: 2 g/dL (ref 1.5–4.5)
Potassium: 4.6 mmol/L (ref 3.5–5.2)
Sodium: 140 mmol/L (ref 134–144)
Total Protein: 6.3 g/dL (ref 6.0–8.5)

## 2018-04-14 LAB — TSH: TSH: 1.78 u[IU]/mL (ref 0.450–4.500)

## 2018-04-20 ENCOUNTER — Telehealth: Payer: Self-pay | Admitting: *Deleted

## 2018-04-20 NOTE — Telephone Encounter (Signed)
04/13/18:  JCV 0.62 - positive

## 2018-07-14 ENCOUNTER — Telehealth: Payer: Self-pay | Admitting: Neurology

## 2018-07-14 NOTE — Telephone Encounter (Signed)
Patient's friend on DPR calling for Tysabri appt.  The message will be provided to [facility] in [place].

## 2018-07-14 NOTE — Telephone Encounter (Signed)
FYI Hammond,Brenda called for pt for  infusion suite
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
## 2018-08-11 ENCOUNTER — Telehealth: Payer: Self-pay | Admitting: Neurology

## 2018-08-11 NOTE — Telephone Encounter (Signed)
Brook @ [facility] is asking for a call re: the completion of the Prior Auth for the TYSABRI 300 MG/15ML injection please call and use Ref WUJ:[id number]

## 2018-08-11 NOTE — Telephone Encounter (Signed)
PA's for infusions done in our office are completed by [facility]

## 2018-08-13 NOTE — Telephone Encounter (Signed)
Dale Edwards states if she can help call her back at [phone] cover My Meds  Brook @ [facility] is asking for a call re: the completion of the Prior Auth for the TYSABRI 300 MG/15ML injection please call and use Ref HID:[id number]

## 2018-08-18 NOTE — Telephone Encounter (Signed)
Error

## 2018-09-08 ENCOUNTER — Telehealth: Payer: Self-pay | Admitting: Neurology

## 2018-09-08 NOTE — Telephone Encounter (Signed)
pt has called for the intrafusion suite, call transferred °

## 2018-10-07 NOTE — Progress Notes (Signed)
[facility]  PATIENT: Dale Edwards DOB: 02-Apr-1956   REASON FOR VISIT: Follow-up for relapsing remitting multiple sclerosis gait abnormality HISTORY FROM: Patient    HISTORY OF PRESENT ILLNESS:Follow-up of relapsing remitting multiple sclerosis, gait abnormality, tremor  He presented with right hand difficulty since 2010, he complains of right hand shaking, especially when he trying to use his right hand, such as holding a utensil, he has to hold his spoon like a child to prevent the food from spitting out, progressively worse over the past one year, he also noticed right leg shaking, especially when he bearing weight since 2013, mild gait difficulty dragging his right leg, he suffered a sudden fell to his right neck in 2007, with right tibial fracture,  He has chronic low back pain, but denies significant shooting pain from back to his lower extremity, he denies bilateral extremity paresthesia, he denies bowel and bladder  MRI brain 2014 showed mild-moderate periventricular and subcortical round and ovoid T2 hyperintensities, some of which are confluent.  MRI cervical 2014, spine At C2-C3, there is posterior central T2 hyperintense spinal cord lesion.  Above findings are consistent with multiple sclerosis.  He has no clear clinical relapsing and remitting, over the past few years, continue gradually worsening gait difficulty especially the right arm and the right leg difficulty.  Laboratory evaluation showed normal or negative vitamin B12, RPR, folic acid, ANA, HIV, C. reactive protein, TSH, CPK, Normal or negative Lyme titer, ACE level, hepatitis panel,  Spinal fluid testing showed WBC 1, glucose 44, total protein of 56, more than 5 oligoclonal banding, and increased IgG synthetic rate, increased IgG index,  He had a past medical history of chronic low back pain, right leg pain, knee pain, COPD, has been on disability since March 2014 due to physical  difficulty, right side difficulty  He was started on tecfidera since August 2014. He denies any side effects to the drug, he has not had any flushing. He denies any falls, he continues to have mild gait difficulty. He denies any loss of bowel or bladder control he has a history of chronic back pain and has been on disability for that.  UPDATE Jan 4th 2016: He tolerate the Tecfidera twice a day, today his main complaint is worsening low back pain, radiating pain to his left lower extremity, worsening gait difficulty, he fell occasionally MRI brain July 2015 at [facility] imaging  1. Moderate periventricular and subcortical chronic demyelinating plaques. Some of these are confluent. Some are hypointense on T1 views. No acute plaques. 2. Moderate perisylvian and corpus callosal atrophy. 3. Compared to MRI from 01/22/13 there is no change. JC virus was positive with titer of 0.8 4 in August 01 2014 UPDATE December 28 2014: He is with his wife at today's clinical visit, he continues to complain low back pain, going down both legs, gait difficulty, increased bilateral hands, leg tremor, right worse than left, He is taking Percocet 10/325 mg 5-6 tablets, Valium 10 mg 3 times daily from primary care Dr. Mikeal Hawthorne.We have reviewed his MRI scans in May 2016 MRI of the brain, confluent periventricular chronic MS lesions, no contrast enhancement, no change compared to previous scan in July 2015 MRI of cervical, posterior ill-defined C2-3 noncontrast enhancing lesion, no change compared to previous scan in June 2014 MRI of lumbar, multilevel degenerative disc disease, most severe at L3-4, L4-5, mild foraminal stenosis, no significant canal stenosis  UPDATE Jul 08 2016: He complains of increased right arm/leg shaking, increased gait  abnormality, continue to get worse, he has no bowel and bladder incontinence.  He does have urinary urgency.    He has significant right hand dysmetria, slow worsening gait  abnormality, I personally reviewed MRI of the brain in May 2016, multiple periventricular MS lesions, no change compared to 2015, MRI cervical lesions, ill defined spinal cord hyperintensity lesion at C2-3, likely chronic demyelinating plaque, MRI lumbar multilevel degenerative disc diseasesignificant foraminal canal stenosis.  After discuss with patient we decided to proceed with Tysarbri infusion, potential side effect explained  Update October 14 2016:  JC virus titer was 0.67 in December 2017, he was switched to [place] infusion, the first one was in February 2018, will have repeat infusion in October 15 2016. He tolerated the infusion are well, no significant side effect noticed. He can walk better, feels more energetic  I reviewed laboratory evaluations, in December 2017, low vitamin D 7.1, varicella-zoster IgG 2396, CBC showed elevated hemoglobin 7.9, normal CPK, CMP,  I have personally reviewed MRI of brain with and without contrast in December, in comparison to previous scan in May 2016, moderate supratentorium lesions noted, no contrast enhancement  No significant changes.  UPDATE 09/13/2018CM Dale Edwards, 63 year old male returns for follow-up for relapsing remitting multiple sclerosis. He is currently on Tysabri since February 2018 doing well on the infusion,  walking has improved. He denies any significant side effects most recent MRI of the brain in December 2017 without significant change from previous 2016. We will get repeat MRI of the brain December 2018. Denies any falls. Uses single-point cane. He continues to have right hand dysmetria. He returns for reevaluation UPDATE 4/4/2019CM Dale Edwards, 63 year old male returns for follow-up with history of relapsing remitting multiple sclerosis.  He is currently on Tysabri since February 2018 doing well on the infusion and his walking has improved even though he continues to use a cane.  He continues to have right hand tremor.  Continues  to have bil numbness ongoing for years.  He denies any bowel or bladder incontinence he does have urinary urgency.  He denies any visual problems.  He returns for reevaluation UPDATE Apr 13 2018: JCV positive 0.84 index in April 2019.  (last JCV 0.67 positive 06/2016).  Normal CBC, CMP  He is tolerating Tysarbri infusion well, no significant side effect noted, continue have gait abnormality, We personally reviewed MRI of the brain with and without contrast in April 2019, multiple T2/flair hyperintensity bilateral hemisphere, deep gray matter, cerebellum, consistent with chronic demyelinating, there was no change compared to previous scan in December 2017, moderate generalized atrophy, no contrast-enhancement  UPDATE 3/12/2020CM Dale Edwards, 63 year old male returns for follow-up with relapsing remitting multiple sclerosis.  He has been Tysabri February 2018 doing well on the  Infusion, his walking has improved even though he continues to use a cane.  Last MRI of the brain 2019 without change from December 2017 consistent with chronic demyelinating disease.04/13/18:JCV 0.62 - positive.  He continues to have right hand tremor he denies any falls.  He denies any increased weakness sensory changes.  He does have some urinary frequency no incontinence.  He returns for reevaluation REVIEW OF SYSTEMS: Full 14 system review of systems performed and notable only for those listed, all others are neg:  Constitutional: neg  Cardiovascular: neg Ear/Nose/Throat: neg Skin: neg Eyes: neg Respiratory: neg Gastroitestinal: Urinary frequency Hematology/Lymphatic: neg  Endocrine: neg Musculoskeletal: Walking difficulty Allergy/Immunology: neg Neurological: Numbness and tremors without change Psychiatric: neg Sleep : neg   ALLERGIES:  Allergies  Allergen Reactions  . Hydrocodone-Acetaminophen Itching and Nausea Only         HOME MEDICATIONS: Outpatient Medications Prior to Visit  Medication Sig  Dispense Refill  . albuterol (PROVENTIL HFA;VENTOLIN HFA) 108 (90 BASE) MCG/ACT inhaler Inhale 2 puffs into the lungs every 6 (six) hours as needed.    . cholecalciferol (VITAMIN D) 1000 units tablet Take 1,000 Units by mouth daily.    . TYSABRI 300 MG/15ML injection monthly    . UNABLE TO FIND Med Name:  Oxygen 2L/[place] at night.     No facility-administered medications prior to visit.     PAST MEDICAL HISTORY: Past Medical History:  Diagnosis Date  . Back pain   . COPD (chronic obstructive pulmonary disease) (HCC)   . Multiple sclerosis (HCC)   . Tremors of nervous system    Right    PAST SURGICAL HISTORY: Past Surgical History:  Procedure Laterality Date  . ADENOIDECTOMY    . broken leg    . TONSILLECTOMY      FAMILY HISTORY: Family History  Problem Relation Age of Onset  . Heart Problems Mother     SOCIAL HISTORY: Social History   Socioeconomic History  . Marital status: Single    Spouse name: Not on file  . Number of children: 0  . Years of education: GED  . Highest education level: Not on file  Occupational History    Employer: UNEMPLOYED    Comment: Disabled  Social Needs  . Financial resource strain: Not on file  . Food insecurity:    Worry: Not on file    Inability: Not on file  . Transportation needs:    Medical: Not on file    Non-medical: Not on file  Tobacco Use  . Smoking status: Current Every Day Smoker    Packs/day: 0.50    Types: Cigarettes  . Smokeless tobacco: Never Used  . Tobacco comment: Half Pack  Substance and Sexual Activity  . Alcohol use: Yes    Alcohol/week: 1.0 standard drinks    Types: 1 Standard drinks or equivalent per week    Comment: quit 2 years ago..   pt is now drinking on occ - very rare maybe once a month.  . Drug use: No  . Sexual activity: Not on file  Lifestyle  . Physical activity:    Days per week: Not on file    Minutes per session: Not on file  . Stress: Not on file  Relationships  . Social connections:     Talks on phone: Not on file    Gets together: Not on file    Attends religious service: Not on file    Active member of club or organization: Not on file    Attends meetings of clubs or organizations: Not on file    Relationship status: Not on file  . Intimate partner violence:    Fear of current or ex partner: Not on file    Emotionally abused: Not on file    Physically abused: Not on file    Forced sexual activity: Not on file  Other Topics Concern  . Not on file  Social History Narrative   Patient lives at home with Rogene Houston.   Patient does not have a significant other.    Patient is disabled.   Patient has no children.    Patient has his GED   Right handed.   Caffeine - four glasses and some sodas     PHYSICAL  EXAM  Vitals:   10/08/18 1432  BP: 113/75  Pulse: 81  Weight: 184 lb 6.4 oz (83.6 kg)  Height: 5' 6.5" (1.689 m)   Body mass index is 29.32 kg/m.  Generalized: Well developed, in no acute distress  Head: normocephalic and atraumatic,. Oropharynx benign  Neck: Supple,   Musculoskeletal: No deformity   Neurological examination   Mentation: Alert oriented to time, place, history taking. Attention span and concentration appropriate. Recent and remote memory intact.  Follows all commands speech and language fluent.   Cranial nerve II-XII: Visual acuity 20/30 left 20/40 right.  Pupils were equal round reactive to light extraocular movements were full, visual field were full on confrontational test. Facial sensation and strength were normal. hearing was intact to finger rubbing bilaterally. Uvula tongue midline. head turning and shoulder shrug were normal and symmetric.Tongue protrusion into cheek strength was normal. Motor: normal bulk and tone, full strength in the BUE, BLE,  Sensory: normal and symmetric to light touch, pinprick, and  Vibration in the upper and lower extremities  Coordination: finger-nose-finger, with significant dysmetria heel-to-shin  bilaterally, moderate dysmetria  Reflexes: Brachioradialis 2/2, biceps 2/2, triceps 2/2, patellar 2/2, Achilles 2/2, plantar responses were flexor bilaterally. Gait and Station: Rising up from seated position without assistance, ambulated short distance in the hall, drags his right leg,  dependent on cane , tandem gait unsteady DIAGNOSTIC DATA (LABS, IMAGING, TESTING) - I reviewed patient records, labs, notes, testing and imaging myself where available.  Lab Results  Component Value Date   WBC 9.1 04/13/2018   HGB 18.4 (H) 04/13/2018   HCT 53.6 (H) 04/13/2018   MCV 94 04/13/2018   PLT 247 10/30/2017      Component Value Date/Time   NA 140 04/13/2018 1543   K 4.6 04/13/2018 1543   CL 101 04/13/2018 1543   CO2 23 04/13/2018 1543   GLUCOSE 88 04/13/2018 1543   GLUCOSE 99 03/25/2014 2030   BUN 8 04/13/2018 1543   CREATININE 0.86 04/13/2018 1543   CALCIUM 9.2 04/13/2018 1543   PROT 6.3 04/13/2018 1543   ALBUMIN 4.3 04/13/2018 1543   AST 17 04/13/2018 1543   ALT 23 04/13/2018 1543   ALKPHOS 109 04/13/2018 1543   BILITOT 1.9 (H) 04/13/2018 1543   GFRNONAA 94 04/13/2018 1543   GFRAA 109 04/13/2018 1543   Lab Results  Component Value Date   CHOL 231 (H) 01/08/2013   HDL 32 (L) 01/08/2013   LDLCALC 146 (H) 01/08/2013   TRIG 267 (H) 01/08/2013   CHOLHDL 7.2 (H) 01/08/2013      ASSESSMENT AND PLAN  63 year old with relapsing remitting multiple sclerosis who started Tysabri February 2018 and has tolerated this well. Low JC virus titer 0.6 07/04/2016. Vitamin D deficiency.04/13/18:JCV 0.62 - positive. MRI of the brain with without contrast was in April 2019, there was no significant change compared to previous scan in 2017.  Gait abnormality.The patient is a current patient of Dr. Terrace Arabia  who is out of the office today . This note is sent to the work in doctor.      PLAN: Continue Tysabri infusions monthly next due March 17 CBC CMP today along with JC virus titer today  MRI of the  brain with and without contrast and compare to 2019 Take vitamin D supplement as directed  Use cane at times for safe ambulation Try to do moderate exercise for overall health and wellbeing Follow-up in 6months I spent 25 minutes in total face to face time with  the patient more than 50% of which was spent counseling and coordination of care, reviewing test results reviewing medications and discussing and reviewing the diagnosis of multiple sclerosis and Vitamin D deficiency importance of adherence with the medications.  Also discussed close MRI follow-up which is necessary to decide on treatment., Cline Crock, [facility], APRN  [facility] [address], [address] [place], [place] [address] [phone]

## 2018-10-08 ENCOUNTER — Other Ambulatory Visit: Payer: Self-pay

## 2018-10-08 ENCOUNTER — Encounter: Payer: Self-pay | Admitting: Nurse Practitioner

## 2018-10-08 ENCOUNTER — Ambulatory Visit (INDEPENDENT_AMBULATORY_CARE_PROVIDER_SITE_OTHER): Payer: Medicare Other | Admitting: Nurse Practitioner

## 2018-10-08 VITALS — BP 113/75 | HR 81 | Ht 66.5 in | Wt 184.4 lb

## 2018-10-08 DIAGNOSIS — R269 Unspecified abnormalities of gait and mobility: Secondary | ICD-10-CM

## 2018-10-08 DIAGNOSIS — R251 Tremor, unspecified: Secondary | ICD-10-CM | POA: Diagnosis not present

## 2018-10-08 DIAGNOSIS — E559 Vitamin D deficiency, unspecified: Secondary | ICD-10-CM | POA: Diagnosis not present

## 2018-10-08 DIAGNOSIS — G35 Multiple sclerosis: Secondary | ICD-10-CM

## 2018-10-08 DIAGNOSIS — Z5181 Encounter for therapeutic drug level monitoring: Secondary | ICD-10-CM

## 2018-10-08 NOTE — Patient Instructions (Signed)
Continue Tysabri infusions monthly CBC CMP today along with JC virus titer today  MRI of the brain  and compare to 2019 Take vitamin D supplement as directed  Follow-up in 66months

## 2018-10-09 LAB — COMPREHENSIVE METABOLIC PANEL
A/G RATIO: 2 (ref 1.2–2.2)
ALK PHOS: 92 IU/L (ref 39–117)
ALT: 23 IU/L (ref 0–44)
AST: 19 IU/L (ref 0–40)
Albumin: 4.3 g/dL (ref 3.8–4.9)
BUN/Creatinine Ratio: 11 (ref 10–24)
BUN: 9 mg/dL (ref 8–27)
Bilirubin Total: 1.4 mg/dL — ABNORMAL HIGH (ref 0.0–1.2)
CO2: 23 mmol/L (ref 20–29)
CREATININE: 0.83 mg/dL (ref 0.76–1.27)
Calcium: 9.1 mg/dL (ref 8.6–10.2)
Chloride: 106 mmol/L (ref 96–106)
GFR calc Af Amer: 111 mL/min/{1.73_m2} (ref >59)
GFR calc non Af Amer: 96 mL/min/{1.73_m2} (ref >59)
Globulin, Total: 2.1 g/dL (ref 1.5–4.5)
Glucose: 86 mg/dL (ref 65–99)
POTASSIUM: 4.6 mmol/L (ref 3.5–5.2)
Sodium: 143 mmol/L (ref 134–144)
Total Protein: 6.4 g/dL (ref 6.0–8.5)

## 2018-10-09 LAB — CBC WITH DIFFERENTIAL/PLATELET
BASOS ABS: 0.1 10*3/uL (ref 0.0–0.2)
Basos: 1 %
EOS (ABSOLUTE): 0.2 10*3/uL (ref 0.0–0.4)
Eos: 3 %
Hematocrit: 49.5 % (ref 37.5–51.0)
Hemoglobin: 17.3 g/dL (ref 13.0–17.7)
IMMATURE GRANS (ABS): 0.1 10*3/uL (ref 0.0–0.1)
IMMATURE GRANULOCYTES: 1 %
LYMPHS: 25 %
Lymphocytes Absolute: 1.8 10*3/uL (ref 0.7–3.1)
MCH: 33.4 pg — ABNORMAL HIGH (ref 26.6–33.0)
MCHC: 34.9 g/dL (ref 31.5–35.7)
MCV: 96 fL (ref 79–97)
MONOS ABS: 0.5 10*3/uL (ref 0.1–0.9)
Monocytes: 7 %
NRBC: 1 % — ABNORMAL HIGH (ref 0–0)
Neutrophils Absolute: 4.5 10*3/uL (ref 1.4–7.0)
Neutrophils: 63 %
PLATELETS: 262 10*3/uL (ref 150–450)
RBC: 5.18 x10E6/uL (ref 4.14–5.80)
RDW: 12.8 % (ref 11.6–15.4)
WBC: 7.2 10*3/uL (ref 3.4–10.8)

## 2018-10-12 ENCOUNTER — Telehealth: Payer: Self-pay | Admitting: Nurse Practitioner

## 2018-10-12 ENCOUNTER — Telehealth: Payer: Self-pay | Admitting: *Deleted

## 2018-10-12 NOTE — Telephone Encounter (Signed)
Spoke with patient and informed him his labs are stable. Patient verbalized understanding, appreciation.  

## 2018-10-12 NOTE — Telephone Encounter (Signed)
[employer] medicare/medicaid order sent to [facility]. No auth they will reach out to the pt to schedule.  °

## 2018-10-15 NOTE — Progress Notes (Signed)
I have read the note, and I agree with the clinical assessment and plan.  Shonya Sumida A. Margaurite Salido, MD, PhD, FAAN Certified in Neurology, Clinical Neurophysiology, Sleep Medicine, Pain Medicine and Neuroimaging  [facility] [address], [address] [place], [place] [address] [phone]  

## 2018-10-15 NOTE — Progress Notes (Signed)
Received JCV positive, index 0.82.  10-14-18 sy

## 2018-10-22 NOTE — Telephone Encounter (Signed)
Spoke to pt and relayed that JCV lab result stable.  He verbalized understanding.

## 2018-10-23 ENCOUNTER — Other Ambulatory Visit: Payer: Medicare Other

## 2018-12-14 ENCOUNTER — Other Ambulatory Visit: Payer: Medicare Other

## 2019-04-12 ENCOUNTER — Other Ambulatory Visit: Payer: Self-pay

## 2019-04-12 ENCOUNTER — Ambulatory Visit (INDEPENDENT_AMBULATORY_CARE_PROVIDER_SITE_OTHER): Payer: Medicare Other | Admitting: Neurology

## 2019-04-12 ENCOUNTER — Encounter: Payer: Self-pay | Admitting: Neurology

## 2019-04-12 VITALS — BP 122/80 | HR 84 | Temp 97.5°F | Ht 66.5 in | Wt 175.8 lb

## 2019-04-12 DIAGNOSIS — R269 Unspecified abnormalities of gait and mobility: Secondary | ICD-10-CM | POA: Diagnosis not present

## 2019-04-12 DIAGNOSIS — G35 Multiple sclerosis: Secondary | ICD-10-CM | POA: Diagnosis not present

## 2019-04-12 NOTE — Progress Notes (Signed)
I have reviewed and agreed above plan. 

## 2019-04-12 NOTE — Patient Instructions (Signed)
1. Continue Tysabri infusion 2. Continue Vit D supplementation 3. Check MRI of the brain 4. Check labs today  5 F/u in 6 months

## 2019-04-12 NOTE — Progress Notes (Signed)
PATIENT: Dale Edwards DOB: Dec 17, 1955  REASON FOR VISIT: follow up HISTORY FROM: patient  HISTORY OF PRESENT ILLNESS: Today 04/12/19  HISTORY  HISTORY OF PRESENT ILLNESS:Follow-up of relapsing remitting multiple sclerosis, gait abnormality, tremor  He presented withright hand difficulty since 2010, he complains of right hand shaking, especially when he trying to use his right hand, such as holding a utensil, he has to hold his spoon like a child to prevent the food from spitting out, progressively worse over the past one year, he also noticed right leg shaking, especially when he bearing weight since 2013, mild gait difficulty dragging his right leg, he suffered a sudden fell to his right neck in 2007, with right tibial fracture,  He has chronic low back pain, but denies significant shooting pain from back to his lower extremity, he denies bilateral extremity paresthesia, he denies bowel and bladder  MRI brain 2014 showed mild-moderate periventricular and subcortical round and ovoid T2 hyperintensities, some of which are confluent.  MRI cervical 2014, spine At C2-C3, there is posterior central T2 hyperintense spinal cord lesion.  Above findings are consistent with multiple sclerosis.  He has no clear clinical relapsing and remitting, over the past few years, continue gradually worsening gait difficulty especially the right arm and the right leg difficulty.  Laboratory evaluation showed normal or negative vitamin B12, RPR, folic acid, ANA, HIV, C. reactive protein, TSH, CPK, Normal or negative Lyme titer, ACE level, hepatitis panel,  Spinal fluid testing showed WBC 1, glucose 44, total protein of 56, more than 5 oligoclonal banding, and increased IgG synthetic rate, increased IgG index,  He had a past medical history of chronic low back pain, right leg pain, knee pain, COPD, has been on disability since March 2014 due to physical difficulty, right side difficulty  He was  started on tecfidera since August 2014. He denies any side effects to the drug, he has not had any flushing. He denies any falls, he continues to have mild gait difficulty. He denies any loss of bowel or bladder control he has a history of chronic back pain and has been on disability for that.  UPDATE Jan 4th 2016: He tolerate the Tecfidera twice a day, today his main complaint is worsening low back pain, radiating pain to his left lower extremity, worsening gait difficulty, he fell occasionally MRI brain July 2015 at [facility] imaging  1. Moderate periventricular and subcortical chronic demyelinating plaques. Some of these are confluent. Some are hypointense on T1 views. No acute plaques. 2. Moderate perisylvian and corpus callosal atrophy. 3. Compared to MRI from 01/22/13 there is no change. JC virus was positive with titer of 0.8 4 in August 01 2014 UPDATE December 28 2014: He is with his wife at today's clinical visit, he continues to complain low back pain, going down both legs, gait difficulty, increased bilateral hands, leg tremor, right worse than left, He is taking Percocet 10/325 mg 5-6 tablets, Valium 10 mg 3 times daily from primary care Dr. Mikeal Hawthorne.We have reviewed his MRI scans in May 2016 MRI of the brain, confluent periventricular chronic MS lesions, no contrast enhancement, no change compared to previous scan in July 2015 MRI of cervical, posterior ill-defined C2-3 noncontrast enhancing lesion, no change compared to previous scan in June 2014 MRI of lumbar, multilevel degenerative disc disease, most severe at L3-4, L4-5, mild foraminal stenosis, no significant canal stenosis  UPDATE Jul 08 2016: He complains of increased right arm/leg shaking, increased gait abnormality, continue to get  worse, he has no bowel and bladder incontinence. He does have urinary urgency.   He has significant right hand dysmetria, slow worsening gait abnormality, I personally reviewed MRI of the brain in  May 2016, multiple periventricular MS lesions, no change compared to 2015, MRI cervical lesions, ill defined spinal cord hyperintensity lesion at C2-3, likely chronic demyelinating plaque, MRI lumbar multilevel degenerative disc diseasesignificant foraminal canal stenosis.  After discuss with patient we decided to proceed with Tysarbri infusion, potential side effect explained  Update October 14 2016: JC virus titer was 0.67 inDecember 2017, he was switched to [place]ysarbri infusion, the first one was in February 2018, will have repeat infusion in October 15 2016. He tolerated the infusion are well, no significant side effect noticed. He can walk better, feels more energetic  I reviewed laboratory evaluations, in December 2017, low vitamin D 7.1, varicella-zoster IgG 2396, CBC showed elevated hemoglobin 7.9, normal CPK, CMP,  I have personally reviewed MRI of brain with and without contrast in December, in comparison to previous scan in May 2016, moderate supratentorium lesions noted, no contrast enhancement  No significant changes.  UPDATE 09/13/2018CM [name], 63 year old male returns for follow-up for relapsing remitting multiple sclerosis. He is currently on Tysabri since February 2018 doing well on the infusion,  walking has improved. He denies any significant side effects most recent MRI of the brain in December 2017 without significant change from previous 2016. We will get repeat MRI of the brain December 2018. Denies any falls. Uses single-point cane. He continues to have right hand dysmetria. He returns for reevaluation UPDATE 4/4/2019CM [name], 63 year old male returns for follow-up with history of relapsing remitting multiple sclerosis.  He is currently on Tysabri since February 2018 doing well on the infusion and his walking has improved even though he continues to use a cane.  He continues to have right hand tremor.  Continues to have bil numbness ongoing for years.  He denies any  bowel or bladder incontinence he does have urinary urgency.  He denies any visual problems.  He returns for reevaluation UPDATE Apr 13 2018: JCV positive 0.84 indexin April 2019. (last JCV 0.67 positive 06/2016).Normal CBC, CMP  He is tolerating Tysarbri infusion well, no significant side effect noted, continue have gait abnormality, We personally reviewed MRI of the brainwith andwithout contrast in April 2019, multiple T2/flair hyperintensity bilateral hemisphere, deep gray matter, cerebellum, consistent with chronic demyelinating, there was no change compared to previous scan in December 2017, moderate generalized atrophy, no contrast-enhancement  UPDATE 3/12/2020CM [name], 63 year old male returns for follow-up with relapsing remitting multiple sclerosis.  He has been Tysabri February 2018 doing well on the  Infusion, his walking has improved even though he continues to use a cane.  Last MRI of the brain 2019 without change from December 2017 consistent with chronic demyelinating disease.04/13/18:JCV 0.62 - positive.  He continues to have right hand tremor he denies any falls.  He denies any increased weakness sensory changes.  He does have some urinary frequency no incontinence.  He returns for reevaluation  Update April 12, 2019 SS: Doing well on Tysabri infusions. JCV 10/08/2018 positive 0.82.  He feels he is stable. His walking is stable, as long as he uses a cane. He denies recent falls. No changes in vision, bowels, bladder. He does have prostate issues. He does not drive a car, he has to get rides here. His overall health has been good. MRI of the brain was ordered in March 2020, but  never completed due to COVID. He has right hand tremor.  He is unaccompanied today.  REVIEW OF SYSTEMS: Out of a complete 14 system review of symptoms, the patient complains only of the following symptoms, and all other reviewed systems are negative.  Numbness, tremors  ALLERGIES: Allergies   Allergen Reactions  . Hydrocodone-Acetaminophen Itching and Nausea Only         HOME MEDICATIONS: Outpatient Medications Prior to Visit  Medication Sig Dispense Refill  . albuterol (PROVENTIL HFA;VENTOLIN HFA) 108 (90 BASE) MCG/ACT inhaler Inhale 2 puffs into the lungs every 6 (six) hours as needed.    . cholecalciferol (VITAMIN D) 1000 units tablet Take 1,000 Units by mouth daily.    . TYSABRI 300 MG/15ML injection monthly    . UNABLE TO FIND Med Name:  Oxygen 2L/[place] at night.     No facility-administered medications prior to visit.     PAST MEDICAL HISTORY: Past Medical History:  Diagnosis Date  . Back pain   . COPD (chronic obstructive pulmonary disease) (HCC)   . Multiple sclerosis (HCC)   . Tremors of nervous system    Right    PAST SURGICAL HISTORY: Past Surgical History:  Procedure Laterality Date  . ADENOIDECTOMY    . broken leg    . TONSILLECTOMY      FAMILY HISTORY: Family History  Problem Relation Age of Onset  . Heart Problems Mother     SOCIAL HISTORY: Social History   Socioeconomic History  . Marital status: Single    Spouse name: Not on file  . Number of children: 0  . Years of education: GED  . Highest education level: Not on file  Occupational History    Employer: UNEMPLOYED    Comment: Disabled  Social Needs  . Financial resource strain: Not on file  . Food insecurity    Worry: Not on file    Inability: Not on file  . Transportation needs    Medical: Not on file    Non-medical: Not on file  Tobacco Use  . Smoking status: Current Every Day Smoker    Packs/day: 0.50    Types: Cigarettes  . Smokeless tobacco: Never Used  . Tobacco comment: Half Pack  Substance and Sexual Activity  . Alcohol use: Yes    Alcohol/week: 1.0 standard drinks    Types: 1 Standard drinks or equivalent per week    Comment: quit 2 years ago..   pt is now drinking on occ - very rare maybe once a month.  . Drug use: No  . Sexual activity: Not on file   Lifestyle  . Physical activity    Days per week: Not on file    Minutes per session: Not on file  . Stress: Not on file  Relationships  . Social Musicianconnections    Talks on phone: Not on file    Gets together: Not on file    Attends religious service: Not on file    Active member of club or organization: Not on file    Attends meetings of clubs or organizations: Not on file    Relationship status: Not on file  . Intimate partner violence    Fear of current or ex partner: Not on file    Emotionally abused: Not on file    Physically abused: Not on file    Forced sexual activity: Not on file  Other Topics Concern  . Not on file  Social History Narrative   Patient lives at home with  Olin Hauser.   Patient does not have a significant other.    Patient is disabled.   Patient has no children.    Patient has his GED   Right handed.   Caffeine - four glasses and some sodas      PHYSICAL EXAM  There were no vitals filed for this visit. There is no height or weight on file to calculate BMI.  Generalized: Well developed, in no acute distress   Neurological examination  Mentation: Alert oriented to time, place, history taking. Follows all commands speech and language fluent Cranial nerve II-XII: Pupils were equal round reactive to light. Extraocular movements were full, visual field were full on confrontational test. Facial sensation and strength were normal.  Head turning and shoulder shrug  were normal and symmetric. Motor: The motor testing reveals 5 over 5 strength of all 4 extremities. Good symmetric motor tone is noted throughout.  Sensory: Sensory testing is intact to soft touch on all 4 extremities. No evidence of extinction is noted.  Coordination: Cerebellar testing reveals good finger-nose-finger and heel-to-shin bilaterally.  Moderate intention tremor on the right noted. Gait and station: Has to push off from seated position, using cane for ambulation, ambulated in the  hallway, circumduction type gait on the right, did not feel comfortable to perform tandem gait Reflexes: Deep tendon reflexes are symmetric and normal bilaterally, but decreased left patellar reflex  DIAGNOSTIC DATA (LABS, IMAGING, TESTING) - I reviewed patient records, labs, notes, testing and imaging myself where available.  Lab Results  Component Value Date   WBC 7.2 10/08/2018   HGB 17.3 10/08/2018   HCT 49.5 10/08/2018   MCV 96 10/08/2018   PLT 262 10/08/2018      Component Value Date/Time   NA 143 10/08/2018 1512   K 4.6 10/08/2018 1512   CL 106 10/08/2018 1512   CO2 23 10/08/2018 1512   GLUCOSE 86 10/08/2018 1512   GLUCOSE 99 03/25/2014 2030   BUN 9 10/08/2018 1512   CREATININE 0.83 10/08/2018 1512   CALCIUM 9.1 10/08/2018 1512   PROT 6.4 10/08/2018 1512   ALBUMIN 4.3 10/08/2018 1512   AST 19 10/08/2018 1512   ALT 23 10/08/2018 1512   ALKPHOS 92 10/08/2018 1512   BILITOT 1.4 (H) 10/08/2018 1512   GFRNONAA 96 10/08/2018 1512   GFRAA 111 10/08/2018 1512   Lab Results  Component Value Date   CHOL 231 (H) 01/08/2013   HDL 32 (L) 01/08/2013   LDLCALC 146 (H) 01/08/2013   TRIG 267 (H) 01/08/2013   CHOLHDL 7.2 (H) 01/08/2013   No results found for: HGBA1C Lab Results  Component Value Date   VITAMINB12 228 01/08/2013   Lab Results  Component Value Date   TSH 1.780 04/13/2018    ASSESSMENT AND PLAN 63 y.o. year old male  has a past medical history of Back pain, COPD (chronic obstructive pulmonary disease) ([place]), Multiple sclerosis ([place]), and Tremors of nervous system. here with:  1.  Relapsing remitting multiple Sclerosis  2.  Gait abnormality -Continue Tysabri infusions -Continue vitamin D supplementation -I will check lab work today including a CBC, CMP, JCV antibody (March 2020, JCV positive, 0.82) -Will order MRI of the brain with and without contrast (last was in April 2019, no change from prior in 2017) -Continue use of cane ambulation, refused walker  in the past -Follow-up in 6 months or sooner if needed with Dr. Krista Blue  I spent 25 minutes with the patient. 50% of this time was spent  discussing his plan of care.   Margie EgeSarah Chanel Mckesson, AGNP-C, DNP 04/12/2019, 2:24 PM [facility] [address], [address] [place], [place] [address] [phone]

## 2019-04-13 ENCOUNTER — Telehealth: Payer: Self-pay | Admitting: *Deleted

## 2019-04-13 ENCOUNTER — Telehealth: Payer: Self-pay | Admitting: Neurology

## 2019-04-13 LAB — COMPREHENSIVE METABOLIC PANEL
ALT: 19 IU/L (ref 0–44)
AST: 16 IU/L (ref 0–40)
Albumin/Globulin Ratio: 2.4 — ABNORMAL HIGH (ref 1.2–2.2)
Albumin: 4.3 g/dL (ref 3.8–4.8)
Alkaline Phosphatase: 84 IU/L (ref 39–117)
BUN/Creatinine Ratio: 10 (ref 10–24)
BUN: 8 mg/dL (ref 8–27)
Bilirubin Total: 1.7 mg/dL — ABNORMAL HIGH (ref 0.0–1.2)
CO2: 23 mmol/L (ref 20–29)
Calcium: 8.7 mg/dL (ref 8.6–10.2)
Chloride: 102 mmol/L (ref 96–106)
Creatinine, Ser: 0.83 mg/dL (ref 0.76–1.27)
GFR calc Af Amer: 110 mL/min/{1.73_m2} (ref >59)
GFR calc non Af Amer: 95 mL/min/{1.73_m2} (ref >59)
Globulin, Total: 1.8 g/dL (ref 1.5–4.5)
Glucose: 87 mg/dL (ref 65–99)
Potassium: 4.5 mmol/L (ref 3.5–5.2)
Sodium: 139 mmol/L (ref 134–144)
Total Protein: 6.1 g/dL (ref 6.0–8.5)

## 2019-04-13 LAB — CBC WITH DIFFERENTIAL/PLATELET
Basophils Absolute: 0.1 10*3/uL (ref 0.0–0.2)
Basos: 1 %
EOS (ABSOLUTE): 0.2 10*3/uL (ref 0.0–0.4)
Eos: 3 %
Hematocrit: 51 % (ref 37.5–51.0)
Hemoglobin: 17.9 g/dL — ABNORMAL HIGH (ref 13.0–17.7)
Immature Grans (Abs): 0 10*3/uL (ref 0.0–0.1)
Immature Granulocytes: 1 %
Lymphocytes Absolute: 2 10*3/uL (ref 0.7–3.1)
Lymphs: 26 %
MCH: 33.3 pg — ABNORMAL HIGH (ref 26.6–33.0)
MCHC: 35.1 g/dL (ref 31.5–35.7)
MCV: 95 fL (ref 79–97)
Monocytes Absolute: 0.5 10*3/uL (ref 0.1–0.9)
Monocytes: 7 %
NRBC: 1 % — ABNORMAL HIGH (ref 0–0)
Neutrophils Absolute: 4.7 10*3/uL (ref 1.4–7.0)
Neutrophils: 62 %
Platelets: 220 10*3/uL (ref 150–450)
RBC: 5.38 x10E6/uL (ref 4.14–5.80)
RDW: 12.4 % (ref 11.6–15.4)
WBC: 7.6 10*3/uL (ref 3.4–10.8)

## 2019-04-13 NOTE — Telephone Encounter (Signed)
LM per family member that labs stable and JCV pending. She will relay when he awakens.

## 2019-04-13 NOTE — Telephone Encounter (Signed)
-----   Message from Suzzanne Cloud, NP sent at 04/13/2019  8:05 AM EDT ----- Please call the patient. Labs are stable. JCV is pending.

## 2019-04-13 NOTE — Telephone Encounter (Signed)
[employer] medicare/medicaid order sent to [facility]. No auth they will reach out to the patient to schedule.  °

## 2019-04-19 ENCOUNTER — Telehealth: Payer: Self-pay | Admitting: Neurology

## 2019-04-19 NOTE — Telephone Encounter (Signed)
JCV 0.72 positive 04/16/2019  Remains on Tysabri, March 2020 JCV 0.82, positive.

## 2019-04-20 NOTE — Telephone Encounter (Signed)
I called and relayed to wife of pt, (he was asleep) that the JCV positive index 0.72.  He will remain on tysabri. She will let him know.

## 2019-05-08 ENCOUNTER — Ambulatory Visit
Admission: RE | Admit: 2019-05-08 | Discharge: 2019-05-08 | Disposition: A | Discharge status: Home / Self Care | Payer: Medicare Other | Source: Ambulatory Visit | Attending: Neurology | Admitting: Neurology

## 2019-05-08 ENCOUNTER — Other Ambulatory Visit: Payer: Self-pay

## 2019-05-08 DIAGNOSIS — G35 Multiple sclerosis: Secondary | ICD-10-CM

## 2019-05-08 MED ORDER — GADOBENATE DIMEGLUMINE 529 MG/ML IV SOLN
17.0000 mL | Freq: Once | INTRAVENOUS | Status: AC | PRN
  Administered 2019-05-08: 17 mL via INTRAVENOUS

## 2019-05-10 ENCOUNTER — Telehealth: Payer: Self-pay

## 2019-05-10 NOTE — Telephone Encounter (Signed)
-----   Message from Suzzanne Cloud, NP sent at 05/10/2019 12:45 PM EDT ----- Please call the patient for MRI of the brain, did not show any enhancing lesions, no change from 11/08/2017.  IMPRESSION:   MRI brain (with and without) demonstrating: - Moderate periventricular subcortical chronic demyelinating disease. - No abnormal enhancing lesions.  No change from MRI on 11/08/2017.

## 2019-05-10 NOTE — Telephone Encounter (Signed)
Spoke with the patient and he verbalized understanding his results. No questions or concerns at this time.   

## 2019-06-29 ENCOUNTER — Telehealth: Payer: Self-pay | Admitting: Neurology

## 2019-08-16 ENCOUNTER — Other Ambulatory Visit: Payer: Self-pay | Admitting: Neurology

## 2019-08-23 ENCOUNTER — Other Ambulatory Visit: Payer: Self-pay | Admitting: Neurology

## 2019-09-29 ENCOUNTER — Telehealth: Payer: Self-pay | Admitting: *Deleted

## 2019-09-29 NOTE — Telephone Encounter (Signed)
The patient was in our office today for his Tysabri w/ Intrafusion. The infusion nurse noted him to have increased tremors in his bilateral hands. She discussed this with Dr. Terrace Arabia and she requested it be noted in his chart. He has an appt pending with her on 10/12/19 where he will be further assessed.

## 2019-10-12 ENCOUNTER — Encounter: Payer: Self-pay | Admitting: Neurology

## 2019-10-12 ENCOUNTER — Other Ambulatory Visit: Payer: Self-pay

## 2019-10-12 ENCOUNTER — Ambulatory Visit (INDEPENDENT_AMBULATORY_CARE_PROVIDER_SITE_OTHER): Payer: Medicare Other | Admitting: Neurology

## 2019-10-12 VITALS — BP 136/83 | HR 76 | Temp 96.9°F | Ht 66.5 in | Wt 184.5 lb

## 2019-10-12 DIAGNOSIS — R269 Unspecified abnormalities of gait and mobility: Secondary | ICD-10-CM | POA: Diagnosis not present

## 2019-10-12 DIAGNOSIS — G35 Multiple sclerosis: Secondary | ICD-10-CM | POA: Insufficient documentation

## 2019-10-12 DIAGNOSIS — R251 Tremor, unspecified: Secondary | ICD-10-CM | POA: Diagnosis not present

## 2019-10-12 DIAGNOSIS — Z79899 Other long term (current) drug therapy: Secondary | ICD-10-CM

## 2019-10-12 MED ORDER — PROPRANOLOL HCL 40 MG PO TABS: 40.0000 mg | ORAL_TABLET | Freq: Three times a day (TID) | ORAL | 11 refills | Status: DC

## 2019-10-12 NOTE — Progress Notes (Signed)
PATIENT: Dale Edwards DOB: May 14, 1956  REASON FOR VISIT: follow up HISTORY FROM: patient  HISTORY OF PRESENT ILLNESS:  He presented withright hand difficulty since 2010, he complains of right hand shaking, especially when he trying to use his right hand, such as holding a utensil, he has to hold his spoon like a child to prevent the food from spitting out, progressively worse over the past one year, he also noticed right leg shaking, especially when he bearing weight since 2013, mild gait difficulty dragging his right leg, he suffered a sudden fell to his right neck in 2007, with right tibial fracture,  He has chronic low back pain, but denies significant shooting pain from back to his lower extremity, he denies bilateral extremity paresthesia, he denies bowel and bladder  MRI brain 2014 showed mild-moderate periventricular and subcortical round and ovoid T2 hyperintensities, some of which are confluent.  MRI cervical 2014, spine At C2-C3, there is posterior central T2 hyperintense spinal cord lesion.  Above findings are consistent with multiple sclerosis.  He has no clear clinical relapsing and remitting, over the past few years, continue gradually worsening gait difficulty especially the right arm and the right leg difficulty.  Laboratory evaluation showed normal or negative vitamin B12, RPR, folic acid, ANA, HIV, C. reactive protein, TSH, CPK, Normal or negative Lyme titer, ACE level, hepatitis panel,  Spinal fluid testing showed WBC 1, glucose 44, total protein of 56, more than 5 oligoclonal banding, and increased IgG synthetic rate, increased IgG index,  He had a past medical history of chronic low back pain, right leg pain, knee pain, COPD, has been on disability since March 2014 due to physical difficulty, right side difficulty  He was started on tecfidera since August 2014. He denies any side effects to the drug, he has not had any flushing. He denies any falls, he  continues to have mild gait difficulty. He denies any loss of bowel or bladder control he has a history of chronic back pain and has been on disability for that.  UPDATE Jan 4th 2016: He tolerate the Tecfidera twice a day, today his main complaint is worsening low back pain, radiating pain to his left lower extremity, worsening gait difficulty, he fell occasionally MRI brain July 2015 at [facility] imaging  1. Moderate periventricular and subcortical chronic demyelinating plaques. Some of these are confluent. Some are hypointense on T1 views. No acute plaques. 2. Moderate perisylvian and corpus callosal atrophy. 3. Compared to MRI from 01/22/13 there is no change. JC virus was positive with titer of 0.8 4 in August 01 2014 UPDATE December 28 2014: He is with his wife at today's clinical visit, he continues to complain low back pain, going down both legs, gait difficulty, increased bilateral hands, leg tremor, right worse than left, He is taking Percocet 10/325 mg 5-6 tablets, Valium 10 mg 3 times daily from primary care Dr. Mikeal Hawthorne.We have reviewed his MRI scans in May 2016 MRI of the brain, confluent periventricular chronic MS lesions, no contrast enhancement, no change compared to previous scan in July 2015 MRI of cervical, posterior ill-defined C2-3 noncontrast enhancing lesion, no change compared to previous scan in June 2014 MRI of lumbar, multilevel degenerative disc disease, most severe at L3-4, L4-5, mild foraminal stenosis, no significant canal stenosis  UPDATE Jul 08 2016: He complains of increased right arm/leg shaking, increased gait abnormality, continue to get worse, he has no bowel and bladder incontinence. He does have urinary urgency.   He has  significant right hand dysmetria, slow worsening gait abnormality, I personally reviewed MRI of the brain in May 2016, multiple periventricular MS lesions, no change compared to 2015, MRI cervical lesions, ill defined spinal cord  hyperintensity lesion at C2-3, likely chronic demyelinating plaque, MRI lumbar multilevel degenerative disc diseasesignificant foraminal canal stenosis.  After discuss with patient we decided to proceed with Tysarbri infusion, potential side effect explained  Update October 14 2016: JC virus titer was 0.67 inDecember 2017, he was switched to [place] infusion, the first one was in February 2018, will have repeat infusion in October 15 2016. He tolerated the infusion are well, no significant side effect noticed. He can walk better, feels more energetic  I reviewed laboratory evaluations, in December 2017, low vitamin D 7.1, varicella-zoster IgG 2396, CBC showed elevated hemoglobin 7.9, normal CPK, CMP,  I have personally reviewed MRI of brain with and without contrast in December, in comparison to previous scan in May 2016, moderate supratentorium lesions noted, no contrast enhancement  No significant changes.   UPDATE Apr 13 2018: JCV positive 0.84 indexin April 2019. (last JCV 0.67 positive 06/2016).Normal CBC, CMP  He is tolerating Tysarbri infusion well, no significant side effect noted, continue have gait abnormality, We personally reviewed MRI of the brainwith andwithout contrast in April 2019, multiple T2/flair hyperintensity bilateral hemisphere, deep gray matter, cerebellum, consistent with chronic demyelinating, there was no change compared to previous scan in December 2017, moderate generalized atrophy, no contrast-enhancement  UPDATE October 12 2019: He came in to follow-up his Relapsing Remitting Multiple Sclerosis, continue to have significant gait abnormality, severe right upper extremity dysmetria, also right leg shaking in a standing still position, there was no significant worsening,  We personally reviewed MRI of the brain with without contrast in October 2020, moderate periventricular subcortical chronic demyelinating disease, no enhancement, no change compared to  previous MRI in April 2019. MRI of cervical spine with without contrast in 2016 showed ill-defined spinal cord hyper intensity posteriorly at C2-3, consistent with chronic demyelinating plaque, no contrast-enhancement, multilevel degenerative changes, no evidence of spinal cord compression,  He is receiving Tysabri monthly through intrafusion, tolerating it well,   REVIEW OF SYSTEMS: Out of a complete 14 system review of symptoms, the patient complains only of the following symptoms, and all other reviewed systems are negative.  Numbness, tremors  ALLERGIES: Allergies  Allergen Reactions  . Hydrocodone-Acetaminophen Itching and Nausea Only         HOME MEDICATIONS: Outpatient Medications Prior to Visit  Medication Sig Dispense Refill  . albuterol (PROVENTIL HFA;VENTOLIN HFA) 108 (90 BASE) MCG/ACT inhaler Inhale 2 puffs into the lungs every 6 (six) hours as needed.    . cholecalciferol (VITAMIN D) 1000 units tablet Take 1,000 Units by mouth daily.    . TYSABRI 300 MG/15ML injection INFUSE 300MG  IV PER  PRESCRIBING INFORMATION  EVERY 4 WEEKS AS DIRECTED  (GIVEN AT MD OFFICE,  DISCARD UNUSED) 15 mL 11  . UNABLE TO FIND Med Name:  Oxygen 2L/[place] at night.     No facility-administered medications prior to visit.    PAST MEDICAL HISTORY: Past Medical History:  Diagnosis Date  . Back pain   . COPD (chronic obstructive pulmonary disease) (HCC)   . Multiple sclerosis (HCC)   . Tremors of nervous system    Right    PAST SURGICAL HISTORY: Past Surgical History:  Procedure Laterality Date  . ADENOIDECTOMY    . broken leg    . TONSILLECTOMY  FAMILY HISTORY: Family History  Problem Relation Age of Onset  . Heart Problems Mother     SOCIAL HISTORY: Social History   Socioeconomic History  . Marital status: Single    Spouse name: Not on file  . Number of children: 0  . Years of education: GED  . Highest education level: Not on file  Occupational History    Employer:  UNEMPLOYED    Comment: Disabled  Tobacco Use  . Smoking status: Current Every Day Smoker    Packs/day: 0.50    Types: Cigarettes  . Smokeless tobacco: Never Used  . Tobacco comment: Half Pack  Substance and Sexual Activity  . Alcohol use: Yes    Alcohol/week: 1.0 standard drinks    Types: 1 Standard drinks or equivalent per week    Comment: quit 2 years ago..   pt is now drinking on occ - very rare maybe once a month.  . Drug use: No  . Sexual activity: Not on file  Other Topics Concern  . Not on file  Social History Narrative   Patient lives at home with Olin Hauser.   Patient does not have a significant other.    Patient is disabled.   Patient has no children.    Patient has his GED   Right handed.   Caffeine - four glasses and some sodas   Social Determinants of Health   Financial Resource Strain:   . Difficulty of Paying Living Expenses:   Food Insecurity:   . Worried About Charity fundraiser in the Last Year:   . Arboriculturist in the Last Year:   Transportation Needs:   . Film/video editor (Medical):   Marland Kitchen Lack of Transportation (Non-Medical):   Physical Activity:   . Days of Exercise per Week:   . Minutes of Exercise per Session:   Stress:   . Feeling of Stress :   Social Connections:   . Frequency of Communication with Friends and Family:   . Frequency of Social Gatherings with Friends and Family:   . Attends Religious Services:   . Active Member of Clubs or Organizations:   . Attends Archivist Meetings:   Marland Kitchen Marital Status:   Intimate Partner Violence:   . Fear of Current or Ex-Partner:   . Emotionally Abused:   Marland Kitchen Physically Abused:   . Sexually Abused:       PHYSICAL EXAM  Vitals:   10/12/19 1516  BP: 136/83  Pulse: 76  Temp: (!) 96.9 F (36.1 C)  Weight: 184 lb 8 oz (83.7 kg)  Height: 5' 6.5" (1.689 m)   Body mass index is 29.33 kg/m.   PHYSICAL EXAMNIATION:  Gen: NAD, conversant, well nourised, well groomed                      Cardiovascular: Regular rate rhythm, no peripheral edema, warm, nontender. Eyes: Conjunctivae clear without exudates or hemorrhage Neck: Supple, no carotid bruits. Pulmonary: Clear to auscultation bilaterally   NEUROLOGICAL EXAM:  MENTAL STATUS: Speech:    Speech is normal; fluent and spontaneous with normal comprehension.  Cognition:     Orientation to time, place and person     Normal recent and remote memory     Normal Attention span and concentration     Normal Language, naming, repeating,spontaneous speech     Fund of knowledge   CRANIAL NERVES: CN II: Visual fields are full to confrontation.  Pupils are round equal  and briskly reactive to light. CN III, IV, VI: extraocular movement are normal. No ptosis. CN V: Facial sensation is intact to pinprick in all 3 divisions bilaterally. Corneal responses are intact.  CN VII: Face is symmetric with normal eye closure and smile. CN VIII: Hearing is normal to casual conversation CN IX, X: Palate elevates symmetrically. Phonation is normal. CN XI: Head turning and shoulder shrug are intact CN XII: Tongue is midline with normal movements and no atrophy.  MOTOR: Fixation of right upper extremity upon right upper extremity upon rapid rotating movement, severe right upper extremity dysmetria,  REFLEXES: Hyperreflexia  SENSORY: Intact to light touch   COORDINATION: Significant right upper extremity finger-to-nose dysmetria, mild right heel-to-shin dysmetria, mild to moderate truncal ataxia  GAIT/STANCE: He needs to rely on his cane to get up from seated position, large amplitude to right lower extremity tremor, wide-based unsteady gait   DIAGNOSTIC DATA (LABS, IMAGING, TESTING) - I reviewed patient records, labs, notes, testing and imaging myself where available.  Lab Results  Component Value Date   WBC 7.6 04/12/2019   HGB 17.9 (H) 04/12/2019   HCT 51.0 04/12/2019   MCV 95 04/12/2019   PLT 220 04/12/2019       Component Value Date/Time   NA 139 04/12/2019 1452   K 4.5 04/12/2019 1452   CL 102 04/12/2019 1452   CO2 23 04/12/2019 1452   GLUCOSE 87 04/12/2019 1452   GLUCOSE 99 03/25/2014 2030   BUN 8 04/12/2019 1452   CREATININE 0.83 04/12/2019 1452   CALCIUM 8.7 04/12/2019 1452   PROT 6.1 04/12/2019 1452   ALBUMIN 4.3 04/12/2019 1452   AST 16 04/12/2019 1452   ALT 19 04/12/2019 1452   ALKPHOS 84 04/12/2019 1452   BILITOT 1.7 (H) 04/12/2019 1452   GFRNONAA 95 04/12/2019 1452   GFRAA 110 04/12/2019 1452   Lab Results  Component Value Date   CHOL 231 (H) 01/08/2013   HDL 32 (L) 01/08/2013   LDLCALC 146 (H) 01/08/2013   TRIG 267 (H) 01/08/2013   CHOLHDL 7.2 (H) 01/08/2013   No results found for: HGBA1C Lab Results  Component Value Date   VITAMINB12 228 01/08/2013   Lab Results  Component Value Date   TSH 1.780 04/13/2018    ASSESSMENT AND PLAN 64 y.o. year old male    Relapsing remitting multiple Sclerosis   Tecfidera August 2014 to January 2018,  Tysabri since February 2018  Low titer positive JC virus, 0.72 in September 2020,  Continue Tysabri infusion, need to repeat JC virus at next visit   Gait abnormality Right hand dysmetria  He has truncal ataxia, limb dysmetria, especially right finger-to-nose, has significant end of target dysmetria,  Those findings are most likely related to his brainstem/cerebellum multiple sclerosis lesion, likely refractory to treatment,  He described increased difficulty using his right hand, will give him a trial low-dose Inderal 40 mg twice a day  Levert Feinstein, M.D. Ph.D.  [facility] [address] [place], [place] [address] Phone: [phone] Fax:      [phone]

## 2019-10-13 ENCOUNTER — Telehealth: Payer: Self-pay | Admitting: Neurology

## 2019-10-13 MED ORDER — PROPRANOLOL HCL 40 MG PO TABS: 40.0000 mg | ORAL_TABLET | Freq: Two times a day (BID) | ORAL | 11 refills | Status: DC

## 2019-10-13 NOTE — Telephone Encounter (Signed)
Called, left message letting pt know he will need labs at his next Tysabri infusion.

## 2019-10-13 NOTE — Telephone Encounter (Signed)
Sent email to [place], RN with intrafusion to make them aware patient needs labs at next Tysabri infusion

## 2019-10-13 NOTE — Telephone Encounter (Signed)
Please remind him to get laboratory evaluation JC virus titer, TSH at next Tysabri infusion

## 2019-11-02 NOTE — Addendum Note (Signed)
Addended by: Tamera Stands D on: 11/02/2019 02:27 PM   Modules accepted: Orders

## 2019-11-03 LAB — TSH: TSH: 1.77 u[IU]/mL (ref 0.450–4.500)

## 2019-11-15 ENCOUNTER — Telehealth: Payer: Self-pay | Admitting: *Deleted

## 2019-11-15 NOTE — Telephone Encounter (Signed)
Labs collected 11/02/19:  JCV ab: 0.79 Positive

## 2020-04-18 ENCOUNTER — Ambulatory Visit: Payer: Medicare Other | Admitting: Neurology

## 2020-04-18 NOTE — Progress Notes (Deleted)
PATIENT: Dale Edwards DOB: Jun 01, 1956  REASON FOR VISIT: follow up HISTORY FROM: patient  HISTORY OF PRESENT ILLNESS: Today 04/18/20  HISTORY  He presented withright hand difficulty since 2010, he complains of right hand shaking, especially when he trying to use his right hand, such as holding a utensil, he has to hold his spoon like a child to prevent the food from spitting out, progressively worse over the past one year, he also noticed right leg shaking, especially when he bearing weight since 2013, mild gait difficulty dragging his right leg, he suffered a sudden fell to his right neck in 2007, with right tibial fracture,  He has chronic low back pain, but denies significant shooting pain from back to his lower extremity, he denies bilateral extremity paresthesia, he denies bowel and bladder  MRI brain 2014 showed mild-moderate periventricular and subcortical round and ovoid T2 hyperintensities, some of which are confluent.  MRI cervical 2014, spine At C2-C3, there is posterior central T2 hyperintense spinal cord lesion.  Above findings are consistent with multiple sclerosis.  He has no clear clinical relapsing and remitting, over the past few years, continue gradually worsening gait difficulty especially the right arm and the right leg difficulty.  Laboratory evaluation showed normal or negative vitamin B12, RPR, folic acid, ANA, HIV, C. reactive protein, TSH, CPK, Normal or negative Lyme titer, ACE level, hepatitis panel,  Spinal fluid testing showed WBC 1, glucose 44, total protein of 56, more than 5 oligoclonal banding, and increased IgG synthetic rate, increased IgG index,  He had a past medical history of chronic low back pain, right leg pain, knee pain, COPD, has been on disability since March 2014 due to physical difficulty, right side difficulty  He was started on tecfidera since August 2014. He denies any side effects to the drug, he has not had any  flushing. He denies any falls, he continues to have mild gait difficulty. He denies any loss of bowel or bladder control he has a history of chronic back pain and has been on disability for that.  UPDATE Jan 4th 2016: He tolerate the Tecfidera twice a day, today his main complaint is worsening low back pain, radiating pain to his left lower extremity, worsening gait difficulty, he fell occasionally MRI brain July 2015 at [facility]  1. Moderate periventricular and subcortical chronic demyelinating plaques. Some of these are confluent. Some are hypointense on T1 views. No acute plaques. 2. Moderate perisylvian and corpus callosal atrophy. 3. Compared to MRI from 01/22/13 there is no change. JC virus was positive with titer of 0.8 4 in August 01 2014 UPDATE December 28 2014: He is with his wife at today's clinical visit, he continues to complain low back pain, going down both legs, gait difficulty, increased bilateral hands, leg tremor, right worse than left, He is taking Percocet 10/325 mg 5-6 tablets, Valium 10 mg 3 times daily from primary care Dr. Mikeal Hawthorne.We have reviewed his MRI scans in May 2016 MRI of the brain, confluent periventricular chronic MS lesions, no contrast enhancement, no change compared to previous scan in July 2015 MRI of cervical, posterior ill-defined C2-3 noncontrast enhancing lesion, no change compared to previous scan in June 2014 MRI of lumbar, multilevel degenerative disc disease, most severe at L3-4, L4-5, mild foraminal stenosis, no significant canal stenosis  UPDATE Jul 08 2016: He complains of increased right arm/leg shaking, increased gait abnormality, continue to get worse, he has no bowel and bladder incontinence. He does have urinary urgency.  He has significant right hand dysmetria, slow worsening gait abnormality, I personally reviewed MRI of the brain in May 2016, multiple periventricular MS lesions, no change compared to 2015, MRI cervical lesions, ill  defined spinal cord hyperintensity lesion at C2-3, likely chronic demyelinating plaque, MRI lumbar multilevel degenerative disc diseasesignificant foraminal canal stenosis.  After discuss with patient we decided to proceed with Tysarbri infusion, potential side effect explained  Update October 14 2016: JC virus titer was 0.67 inDecember 2017, he was switched to [place] infusion, the first one was in February 2018, will have repeat infusion in October 15 2016. He tolerated the infusion are well, no significant side effect noticed. He can walk better, feels more energetic  I reviewed laboratory evaluations, in December 2017, low vitamin D 7.1, varicella-zoster IgG 2396, CBC showed elevated hemoglobin 7.9, normal CPK, CMP,  I have personally reviewed MRI of brain with and without contrast in December, in comparison to previous scan in May 2016, moderate supratentorium lesions noted, no contrast enhancement No significant changes.   UPDATE Apr 13 2018: JCV positive 0.84 indexin April 2019. (last JCV 0.67 positive 06/2016).Normal CBC, CMP  He is tolerating Tysarbri infusion well, no significant side effect noted, continue have gait abnormality, We personally reviewed MRI of the brainwith andwithout contrast in April 2019, multiple T2/flair hyperintensity bilateral hemisphere, deep gray matter, cerebellum, consistent with chronic demyelinating, there was no change compared to previous scan in December 2017, moderate generalized atrophy, no contrast-enhancement  UPDATE October 12 2019: He came in to follow-up his Relapsing Remitting Multiple Sclerosis, continue to have significant gait abnormality, severe right upper extremity dysmetria, also right leg shaking in a standing still position, there was no significant worsening,  We personally reviewed MRI of the brain with without contrast in October 2020, moderate periventricular subcortical chronic demyelinating disease, no enhancement, no  change compared to previous MRI in April 2019. MRI of cervical spine with without contrast in 2016 showed ill-defined spinal cord hyper intensity posteriorly at C2-3, consistent with chronic demyelinating plaque, no contrast-enhancement, multilevel degenerative changes, no evidence of spinal cord compression,  He is receiving Tysabri monthly through intrafusion, tolerating it well,  Update April 18, 2020 SS:   REVIEW OF SYSTEMS: Out of a complete 14 system review of symptoms, the patient complains only of the following symptoms, and all other reviewed systems are negative.  ALLERGIES: Allergies  Allergen Reactions  . Hydrocodone-Acetaminophen Itching and Nausea Only         HOME MEDICATIONS: Outpatient Medications Prior to Visit  Medication Sig Dispense Refill  . albuterol (PROVENTIL HFA;VENTOLIN HFA) 108 (90 BASE) MCG/ACT inhaler Inhale 2 puffs into the lungs every 6 (six) hours as needed.    . cholecalciferol (VITAMIN D) 1000 units tablet Take 1,000 Units by mouth daily.    . propranolol (INDERAL) 40 MG tablet Take 1 tablet (40 mg total) by mouth 2 (two) times daily. 60 tablet 11  . TYSABRI 300 MG/15ML injection INFUSE 300MG  IV PER  PRESCRIBING INFORMATION  EVERY 4 WEEKS AS DIRECTED  (GIVEN AT MD OFFICE,  DISCARD UNUSED) 15 mL 11  . UNABLE TO FIND Med Name:  Oxygen 2L/[place] at night.     No facility-administered medications prior to visit.    PAST MEDICAL HISTORY: Past Medical History:  Diagnosis Date  . Back pain   . COPD (chronic obstructive pulmonary disease) (HCC)   . Multiple sclerosis (HCC)   . Tremors of nervous system    Right    PAST SURGICAL HISTORY:  Past Surgical History:  Procedure Laterality Date  . ADENOIDECTOMY    . broken leg    . TONSILLECTOMY      FAMILY HISTORY: Family History  Problem Relation Age of Onset  . Heart Problems Mother     SOCIAL HISTORY: Social History   Socioeconomic History  . Marital status: Single    Spouse name: Not on  file  . Number of children: 0  . Years of education: GED  . Highest education level: Not on file  Occupational History    Employer: UNEMPLOYED    Comment: Disabled  Tobacco Use  . Smoking status: Current Every Day Smoker    Packs/day: 0.50    Types: Cigarettes  . Smokeless tobacco: Never Used  . Tobacco comment: Half Pack  Substance and Sexual Activity  . Alcohol use: Yes    Alcohol/week: 1.0 standard drink    Types: 1 Standard drinks or equivalent per week    Comment: quit 2 years ago..   pt is now drinking on occ - very rare maybe once a month.  . Drug use: No  . Sexual activity: Not on file  Other Topics Concern  . Not on file  Social History Narrative   Patient lives at home with Rogene Houston.   Patient does not have a significant other.    Patient is disabled.   Patient has no children.    Patient has his GED   Right handed.   Caffeine - four glasses and some sodas   Social Determinants of Health   Financial Resource Strain:   . Difficulty of Paying Living Expenses: Not on file  Food Insecurity:   . Worried About Programme researcher, broadcasting/film/video in the Last Year: Not on file  . Ran Out of Food in the Last Year: Not on file  Transportation Needs:   . Lack of Transportation (Medical): Not on file  . Lack of Transportation (Non-Medical): Not on file  Physical Activity:   . Days of Exercise per Week: Not on file  . Minutes of Exercise per Session: Not on file  Stress:   . Feeling of Stress : Not on file  Social Connections:   . Frequency of Communication with Friends and Family: Not on file  . Frequency of Social Gatherings with Friends and Family: Not on file  . Attends Religious Services: Not on file  . Active Member of Clubs or Organizations: Not on file  . Attends Banker Meetings: Not on file  . Marital Status: Not on file  Intimate Partner Violence:   . Fear of Current or Ex-Partner: Not on file  . Emotionally Abused: Not on file  . Physically Abused:  Not on file  . Sexually Abused: Not on file      PHYSICAL EXAM  There were no vitals filed for this visit. There is no height or weight on file to calculate BMI.  Generalized: Well developed, in no acute distress   Neurological examination  Mentation: Alert oriented to time, place, history taking. Follows all commands speech and language fluent Cranial nerve II-XII: Pupils were equal round reactive to light. Extraocular movements were full, visual field were full on confrontational test. Facial sensation and strength were normal. Uvula tongue midline. Head turning and shoulder shrug  were normal and symmetric. Motor: The motor testing reveals 5 over 5 strength of all 4 extremities. Good symmetric motor tone is noted throughout.  Sensory: Sensory testing is intact to soft touch on all 4  extremities. No evidence of extinction is noted.  Coordination: Cerebellar testing reveals good finger-nose-finger and heel-to-shin bilaterally.  Gait and station: Gait is normal. Tandem gait is normal. Romberg is negative. No drift is seen.  Reflexes: Deep tendon reflexes are symmetric and normal bilaterally.   DIAGNOSTIC DATA (LABS, IMAGING, TESTING) - I reviewed patient records, labs, notes, testing and imaging myself where available.  Lab Results  Component Value Date   WBC 7.6 04/12/2019   HGB 17.9 (H) 04/12/2019   HCT 51.0 04/12/2019   MCV 95 04/12/2019   PLT 220 04/12/2019      Component Value Date/Time   NA 139 04/12/2019 1452   K 4.5 04/12/2019 1452   CL 102 04/12/2019 1452   CO2 23 04/12/2019 1452   GLUCOSE 87 04/12/2019 1452   GLUCOSE 99 03/25/2014 2030   BUN 8 04/12/2019 1452   CREATININE 0.83 04/12/2019 1452   CALCIUM 8.7 04/12/2019 1452   PROT 6.1 04/12/2019 1452   ALBUMIN 4.3 04/12/2019 1452   AST 16 04/12/2019 1452   ALT 19 04/12/2019 1452   ALKPHOS 84 04/12/2019 1452   BILITOT 1.7 (H) 04/12/2019 1452   GFRNONAA 95 04/12/2019 1452   GFRAA 110 04/12/2019 1452   Lab  Results  Component Value Date   CHOL 231 (H) 01/08/2013   HDL 32 (L) 01/08/2013   LDLCALC 146 (H) 01/08/2013   TRIG 267 (H) 01/08/2013   CHOLHDL 7.2 (H) 01/08/2013   No results found for: HGBA1C Lab Results  Component Value Date   VITAMINB12 228 01/08/2013   Lab Results  Component Value Date   TSH 1.770 11/02/2019      ASSESSMENT AND PLAN 64 y.o. year old male  has a past medical history of Back pain, COPD (chronic obstructive pulmonary disease) (HCC), Multiple sclerosis (HCC), and Tremors of nervous system. here with:  1.  Relapsing remitting multiple sclerosis -On Tecfidera August 2014 to January 2018 -On Tysabri since February 2018 -Low titer positive JCV 0.72 in September 2020, 0.79 JCV in April 2021  2.  Gait abnormality, right hand dysmetria -Truncal ataxia, limb dysmetria with finger-nose-finger, likely related to brainstem/cerebellum MS lesion load, likely refractory to treatment -Given trial of Inderal 40 mg twice a day   I spent 15 minutes with the patient. 50% of this time was spent   Margie Ege, [place], DNP 04/18/2020, 5:45 AM [facility] [address], [address] [place], [place] [address] [phone]

## 2020-05-30 ENCOUNTER — Other Ambulatory Visit: Payer: Self-pay | Admitting: *Deleted

## 2020-05-30 ENCOUNTER — Telehealth: Payer: Self-pay | Admitting: *Deleted

## 2020-05-30 DIAGNOSIS — G35 Multiple sclerosis: Secondary | ICD-10-CM

## 2020-05-30 DIAGNOSIS — Z79899 Other long term (current) drug therapy: Secondary | ICD-10-CM

## 2020-05-30 NOTE — Telephone Encounter (Signed)
Duplicate note opened in error  

## 2020-05-30 NOTE — Addendum Note (Signed)
Addended by: Tamera Stands D on: 05/30/2020 02:30 PM   Modules accepted: Orders

## 2020-05-30 NOTE — Telephone Encounter (Signed)
05/30/20 - JCV ab labs drawn today and placed in [facility] pick up box.

## 2020-05-31 ENCOUNTER — Telehealth: Payer: Self-pay | Admitting: Neurology

## 2020-05-31 LAB — CBC WITH DIFFERENTIAL/PLATELET
Basophils Absolute: 0.1 10*3/uL (ref 0.0–0.2)
Basos: 1 %
EOS (ABSOLUTE): 0.3 10*3/uL (ref 0.0–0.4)
Eos: 3 %
Hematocrit: 51.4 % — ABNORMAL HIGH (ref 37.5–51.0)
Hemoglobin: 18 g/dL — ABNORMAL HIGH (ref 13.0–17.7)
Immature Grans (Abs): 0 10*3/uL (ref 0.0–0.1)
Immature Granulocytes: 0 %
Lymphocytes Absolute: 1.8 10*3/uL (ref 0.7–3.1)
Lymphs: 16 %
MCH: 34.3 pg — ABNORMAL HIGH (ref 26.6–33.0)
MCHC: 35 g/dL (ref 31.5–35.7)
MCV: 98 fL — ABNORMAL HIGH (ref 79–97)
Monocytes Absolute: 1.2 10*3/uL — ABNORMAL HIGH (ref 0.1–0.9)
Monocytes: 11 %
Neutrophils Absolute: 7.7 10*3/uL — ABNORMAL HIGH (ref 1.4–7.0)
Neutrophils: 69 %
Platelets: 205 10*3/uL (ref 150–450)
RBC: 5.25 x10E6/uL (ref 4.14–5.80)
RDW: 12.8 % (ref 11.6–15.4)
WBC: 11.2 10*3/uL — ABNORMAL HIGH (ref 3.4–10.8)

## 2020-05-31 NOTE — Telephone Encounter (Signed)
Please call patient, laboratory evaluation showed elevated WBC, continue trending up of hemoglobin 18.8,  I have forwarded the laboratory results to his primary care physician Dr. Rometta Emery, MD   Ask him if he has any signs of infection, may consider repeat laboratory evaluation by his primary care physician (preferred), or through our laboratory laboratory in 1 month

## 2020-05-31 NOTE — Telephone Encounter (Signed)
Left message requesting a return call.

## 2020-05-31 NOTE — Telephone Encounter (Signed)
I was able to speak to this patient's wife. Reports he has been experiencing cough, congestion and pressure behind eyes. She said he had recently tested negative for COVID-19. He has been concerned about a sinus infection. She will contact his PCP to schedule an appt to follow up on his labs and symptoms of infection.

## 2020-06-05 NOTE — Telephone Encounter (Addendum)
JCV ab 0.60 positive  Printed copy provided to Dr. Terrace Arabia for review then will be sent for scanning.

## 2020-07-31 NOTE — Progress Notes (Deleted)
PATIENT: Dale Edwards DOB: 1956-07-08  REASON FOR VISIT: follow up HISTORY FROM: patient  HISTORY OF PRESENT ILLNESS: Today 07/31/20  HISTORY  He presented withright hand difficulty since 2010, he complains of right hand shaking, especially when he trying to use his right hand, such as holding a utensil, he has to hold his spoon like a child to prevent the food from spitting out, progressively worse over the past one year, he also noticed right leg shaking, especially when he bearing weight since 2013, mild gait difficulty dragging his right leg, he suffered a sudden fell to his right neck in 2007, with right tibial fracture,  He has chronic low back pain, but denies significant shooting pain from back to his lower extremity, he denies bilateral extremity paresthesia, he denies bowel and bladder  MRI brain 2014 showed mild-moderate periventricular and subcortical round and ovoid T2 hyperintensities, some of which are confluent.  MRI cervical 2014, spine At C2-C3, there is posterior central T2 hyperintense spinal cord lesion.  Above findings are consistent with multiple sclerosis.  He has no clear clinical relapsing and remitting, over the past few years, continue gradually worsening gait difficulty especially the right arm and the right leg difficulty.  Laboratory evaluation showed normal or negative vitamin M01, RPR, folic acid, ANA, HIV, C. reactive protein, TSH, CPK, Normal or negative Lyme titer, ACE level, hepatitis panel,  Spinal fluid testing showed WBC 1, glucose 44, total protein of 56, more than 5 oligoclonal banding, and increased IgG synthetic rate, increased IgG index,  He had a past medical history of chronic low back pain, right leg pain, knee pain, COPD, has been on disability since March 2014 due to physical difficulty, right side difficulty  He was started on tecfidera since August 2014. He denies any side effects to the drug, he has not had any  flushing. He denies any falls, he continues to have mild gait difficulty. He denies any loss of bowel or bladder control he has a history of chronic back pain and has been on disability for that.  UPDATE Jan 4th 2016: He tolerate the Tecfidera twice a day, today his main complaint is worsening low back pain, radiating pain to his left lower extremity, worsening gait difficulty, he fell occasionally MRI brain July 2015 at [facility]  1. Moderate periventricular and subcortical chronic demyelinating plaques. Some of these are confluent. Some are hypointense on T1 views. No acute plaques. 2. Moderate perisylvian and corpus callosal atrophy. 3. Compared to MRI from 01/22/13 there is no change. JC virus was positive with titer of 0.8 4 in August 01 2014 UPDATE December 28 2014: He is with his wife at today's clinical visit, he continues to complain low back pain, going down both legs, gait difficulty, increased bilateral hands, leg tremor, right worse than left, He is taking Percocet 10/325 mg 5-6 tablets, Valium 10 mg 3 times daily from primary care Dr. Jonelle Sidle.We have reviewed his MRI scans in May 2016 MRI of the brain, confluent periventricular chronic MS lesions, no contrast enhancement, no change compared to previous scan in July 2015 MRI of cervical, posterior ill-defined C2-3 noncontrast enhancing lesion, no change compared to previous scan in June 2014 MRI of lumbar, multilevel degenerative disc disease, most severe at L3-4, L4-5, mild foraminal stenosis, no significant canal stenosis  UPDATE Jul 08 2016: He complains of increased right arm/leg shaking, increased gait abnormality, continue to get worse, he has no bowel and bladder incontinence. He does have urinary urgency.  He has significant right hand dysmetria, slow worsening gait abnormality, I personally reviewed MRI of the brain in May 2016, multiple periventricular MS lesions, no change compared to 2015, MRI cervical lesions, ill  defined spinal cord hyperintensity lesion at C2-3, likely chronic demyelinating plaque, MRI lumbar multilevel degenerative disc diseasesignificant foraminal canal stenosis.  After discuss with patient we decided to proceed with Tysarbri infusion, potential side effect explained  Update October 14 2016: JC virus titer was 0.67 inDecember 2017, he was switched to [place] infusion, the first one was in February 2018, will have repeat infusion in October 15 2016. He tolerated the infusion are well, no significant side effect noticed. He can walk better, feels more energetic  I reviewed laboratory evaluations, in December 2017, low vitamin D 7.1, varicella-zoster IgG 2396, CBC showed elevated hemoglobin 7.9, normal CPK, CMP,  I have personally reviewed MRI of brain with and without contrast in December, in comparison to previous scan in May 2016, moderate supratentorium lesions noted, no contrast enhancement No significant changes.   UPDATE Apr 13 2018: JCV positive 0.84 indexin April 2019. (last JCV 0.67 positive 06/2016).Normal CBC, CMP  He is tolerating Tysarbri infusion well, no significant side effect noted, continue have gait abnormality, We personally reviewed MRI of the brainwith andwithout contrast in April 2019, multiple T2/flair hyperintensity bilateral hemisphere, deep gray matter, cerebellum, consistent with chronic demyelinating, there was no change compared to previous scan in December 2017, moderate generalized atrophy, no contrast-enhancement  UPDATE October 12 2019: He came in to follow-up his Relapsing Remitting Multiple Sclerosis, continue to have significant gait abnormality, severe right upper extremity dysmetria, also right leg shaking in a standing still position, there was no significant worsening,  We personally reviewed MRI of the brain with without contrast in October 2020, moderate periventricular subcortical chronic demyelinating disease, no enhancement, no  change compared to previous MRI in April 2019. MRI of cervical spine with without contrast in 2016 showed ill-defined spinal cord hyper intensity posteriorly at C2-3, consistent with chronic demyelinating plaque, no contrast-enhancement, multilevel degenerative changes, no evidence of spinal cord compression,  He is receiving Tysabri monthly through intrafusion, tolerating it well,  Update August 01, 2020 SS: Previous labs have shown elevated WBC, HGB 18.8.  JCV in November 0.60 positive.   REVIEW OF SYSTEMS: Out of a complete 14 system review of symptoms, the patient complains only of the following symptoms, and all other reviewed systems are negative.  ALLERGIES: Allergies  Allergen Reactions  . Hydrocodone-Acetaminophen Itching and Nausea Only         HOME MEDICATIONS: Outpatient Medications Prior to Visit  Medication Sig Dispense Refill  . albuterol (PROVENTIL HFA;VENTOLIN HFA) 108 (90 BASE) MCG/ACT inhaler Inhale 2 puffs into the lungs every 6 (six) hours as needed.    . cholecalciferol (VITAMIN D) 1000 units tablet Take 1,000 Units by mouth daily.    . propranolol (INDERAL) 40 MG tablet Take 1 tablet (40 mg total) by mouth 2 (two) times daily. 60 tablet 11  . TYSABRI 300 MG/15ML injection INFUSE 300MG  IV PER  PRESCRIBING INFORMATION  EVERY 4 WEEKS AS DIRECTED  (GIVEN AT MD OFFICE,  DISCARD UNUSED) 15 mL 11  . UNABLE TO FIND Med Name:  Oxygen 2L/[place] at night.     No facility-administered medications prior to visit.    PAST MEDICAL HISTORY: Past Medical History:  Diagnosis Date  . Back pain   . COPD (chronic obstructive pulmonary disease) (HCC)   . Multiple sclerosis (HCC)   .  Tremors of nervous system    Right    PAST SURGICAL HISTORY: Past Surgical History:  Procedure Laterality Date  . ADENOIDECTOMY    . broken leg    . TONSILLECTOMY      FAMILY HISTORY: Family History  Problem Relation Age of Onset  . Heart Problems Mother     SOCIAL HISTORY: Social  History   Socioeconomic History  . Marital status: Single    Spouse name: Not on file  . Number of children: 0  . Years of education: GED  . Highest education level: Not on file  Occupational History    Employer: UNEMPLOYED    Comment: Disabled  Tobacco Use  . Smoking status: Current Every Day Smoker    Packs/day: 0.50    Types: Cigarettes  . Smokeless tobacco: Never Used  . Tobacco comment: Half Pack  Substance and Sexual Activity  . Alcohol use: Yes    Alcohol/week: 1.0 standard drink    Types: 1 Standard drinks or equivalent per week    Comment: quit 2 years ago..   pt is now drinking on occ - very rare maybe once a month.  . Drug use: No  . Sexual activity: Not on file  Other Topics Concern  . Not on file  Social History Narrative   Patient lives at home with Rogene Houston.   Patient does not have a significant other.    Patient is disabled.   Patient has no children.    Patient has his GED   Right handed.   Caffeine - four glasses and some sodas   Social Determinants of Health   Financial Resource Strain: Not on file  Food Insecurity: Not on file  Transportation Needs: Not on file  Physical Activity: Not on file  Stress: Not on file  Social Connections: Not on file  Intimate Partner Violence: Not on file      PHYSICAL EXAM  There were no vitals filed for this visit. There is no height or weight on file to calculate BMI.  Generalized: Well developed, in no acute distress   Neurological examination  Mentation: Alert oriented to time, place, history taking. Follows all commands speech and language fluent Cranial nerve II-XII: Pupils were equal round reactive to light. Extraocular movements were full, visual field were full on confrontational test. Facial sensation and strength were normal. Uvula tongue midline. Head turning and shoulder shrug  were normal and symmetric. Motor: The motor testing reveals 5 over 5 strength of all 4 extremities. Good symmetric  motor tone is noted throughout.  Sensory: Sensory testing is intact to soft touch on all 4 extremities. No evidence of extinction is noted.  Coordination: Cerebellar testing reveals good finger-nose-finger and heel-to-shin bilaterally.  Gait and station: Gait is normal. Tandem gait is normal. Romberg is negative. No drift is seen.  Reflexes: Deep tendon reflexes are symmetric and normal bilaterally.   DIAGNOSTIC DATA (LABS, IMAGING, TESTING) - I reviewed patient records, labs, notes, testing and imaging myself where available.  Lab Results  Component Value Date   WBC 11.2 (H) 05/30/2020   HGB 18.0 (H) 05/30/2020   HCT 51.4 (H) 05/30/2020   MCV 98 (H) 05/30/2020   PLT 205 05/30/2020      Component Value Date/Time   NA 139 04/12/2019 1452   K 4.5 04/12/2019 1452   CL 102 04/12/2019 1452   CO2 23 04/12/2019 1452   GLUCOSE 87 04/12/2019 1452   GLUCOSE 99 03/25/2014 2030   BUN 8  04/12/2019 1452   CREATININE 0.83 04/12/2019 1452   CALCIUM 8.7 04/12/2019 1452   PROT 6.1 04/12/2019 1452   ALBUMIN 4.3 04/12/2019 1452   AST 16 04/12/2019 1452   ALT 19 04/12/2019 1452   ALKPHOS 84 04/12/2019 1452   BILITOT 1.7 (H) 04/12/2019 1452   GFRNONAA 95 04/12/2019 1452   GFRAA 110 04/12/2019 1452   Lab Results  Component Value Date   CHOL 231 (H) 01/08/2013   HDL 32 (L) 01/08/2013   LDLCALC 146 (H) 01/08/2013   TRIG 267 (H) 01/08/2013   CHOLHDL 7.2 (H) 01/08/2013   No results found for: HGBA1C Lab Results  Component Value Date   VITAMINB12 228 01/08/2013   Lab Results  Component Value Date   TSH 1.770 11/02/2019      ASSESSMENT AND PLAN 65 y.o. year old male  has a past medical history of Back pain, COPD (chronic obstructive pulmonary disease) (HCC), Multiple sclerosis (HCC), and Tremors of nervous system. here with:  1.  Relapsing remitting multiple sclerosis -Tecfidera August 2014 to January 2018, -Tysabri since February 2018 -Low titer positive JC virus, 0.72 in September  2020, -Continue Tysabri infusion, in November JCV 0.60 positive  2.  Gait abnormality 3.  Right hand dysmetria -Truncal ataxia, limb dysmetria, felt to be related to brainstem/cerebellum MS lesions, will likely be refractory to treatment -Continue Inderal 40 mg twice a day  I spent 15 minutes with the patient. 50% of this time was spent   Margie Ege, AGNP-C, DNP 07/31/2020, 1:25 PM [facility] [address] [place], [place] [address] [phone]

## 2020-08-01 ENCOUNTER — Ambulatory Visit: Payer: Medicare Other | Admitting: Neurology

## 2020-08-01 ENCOUNTER — Encounter: Payer: Self-pay | Admitting: Neurology

## 2020-08-06 ENCOUNTER — Other Ambulatory Visit: Payer: Self-pay | Admitting: Neurology

## 2020-08-18 ENCOUNTER — Telehealth: Payer: Self-pay | Admitting: Neurology

## 2020-08-18 NOTE — Telephone Encounter (Signed)
I attempted to contact patient. We only have one number listed for him. His emergency contact has the same number. The voicemail is full and I was unable to leave a message. He does not have an active mychart account. I will provide this information to [facility] who manages his infusion appointments and Tysabri shipments.

## 2020-08-18 NOTE — Telephone Encounter (Signed)
[facility]) called, is not able to get in touch with the Pt. We need his consent to ship his  TYSABRI 300 MG/15ML injection for this year. The phone numbers we have are not answering or voicemail is full. Could the nurse reach out to him. Would like a call from the nurse.

## 2020-09-26 ENCOUNTER — Encounter: Payer: Self-pay | Admitting: Neurology

## 2020-09-26 ENCOUNTER — Ambulatory Visit (INDEPENDENT_AMBULATORY_CARE_PROVIDER_SITE_OTHER): Payer: Medicare Other | Admitting: Neurology

## 2020-09-26 VITALS — BP 125/80 | HR 72 | Ht 66.5 in | Wt 170.5 lb

## 2020-09-26 DIAGNOSIS — G35 Multiple sclerosis: Secondary | ICD-10-CM | POA: Diagnosis not present

## 2020-09-26 DIAGNOSIS — M5416 Radiculopathy, lumbar region: Secondary | ICD-10-CM

## 2020-09-26 DIAGNOSIS — R269 Unspecified abnormalities of gait and mobility: Secondary | ICD-10-CM

## 2020-09-26 NOTE — Progress Notes (Signed)
HISTORY OF PRESENT ILLNESS:  Dale Edwards presented withright hand difficulty since 2010, Dale Edwards complains of right hand shaking, especially when Dale Edwards trying to use his right hand, such as holding a utensil, Dale Edwards has to hold his spoon like a child to prevent the food from spitting out, progressively worse over the past one year, Dale Edwards also noticed right leg shaking, especially when Dale Edwards bearing weight since 2013, mild gait difficulty dragging his right leg, Dale Edwards suffered a sudden fell to his right leg in 2007, with right tibial fracture,  Dale Edwards has chronic low back pain, but denies significant shooting pain from back to his lower extremity, Dale Edwards denies bilateral extremity paresthesia, Dale Edwards denies bowel and bladder  MRI brain 2014 showed mild-moderate periventricular and subcortical round and ovoid T2 hyperintensities, some of which are confluent.  MRI cervical 2014, spine At C2-C3, there is posterior central T2 hyperintense spinal cord lesion.  Above findings are consistent with multiple sclerosis.  Dale Edwards has no clear clinical relapsing and remitting, over the past few years, continue gradually worsening gait difficulty especially the right arm and the right leg difficulty.  Laboratory evaluation showed normal or negative vitamin B12, RPR, folic acid, ANA, HIV, C. reactive protein, TSH, CPK, Normal or negative Lyme titer, ACE level, hepatitis panel,  Spinal fluid testing showed WBC 1, glucose 44, total protein of 56, more than 5 oligoclonal banding, and increased IgG synthetic rate, increased IgG index,  Dale Edwards had a past medical history of chronic low back pain, right leg pain, knee pain, COPD, has been on disability since March 2014 due to physical difficulty, right side difficulty  Dale Edwards was started on tecfidera since August 2014. Dale Edwards denies any side effects to the drug, Dale Edwards has not had any flushing. Dale Edwards denies any falls, Dale Edwards continues to have mild gait difficulty. Dale Edwards denies any loss of bowel or bladder control Dale Edwards has a  history of chronic back pain and has been on disability for that.  UPDATE Jan 4th 2016: Dale Edwards tolerate the Tecfidera twice a day, today his main complaint is worsening low back pain, radiating pain to his left lower extremity, worsening gait difficulty, Dale Edwards fell occasionally MRI brain July 2015 at [facility]  1. Moderate periventricular and subcortical chronic demyelinating plaques. Some of these are confluent. Some are hypointense on T1 views. No acute plaques. 2. Moderate perisylvian and corpus callosal atrophy. 3. Compared to MRI from 01/22/13 there is no change. JC virus was positive with titer of 0.8 4 in August 01 2014 UPDATE December 28 2014: Dale Edwards is with his wife at today's clinical visit, Dale Edwards continues to complain low back pain, going down both legs, gait difficulty, increased bilateral hands, leg tremor, right worse than left, Dale Edwards is taking Percocet 10/325 mg 5-6 tablets, Valium 10 mg 3 times daily from primary care Dr. Mikeal Hawthorne.We have reviewed his MRI scans in May 2016 MRI of the brain, confluent periventricular chronic MS lesions, no contrast enhancement, no change compared to previous scan in July 2015 MRI of cervical, posterior ill-defined C2-3 noncontrast enhancing lesion, no change compared to previous scan in June 2014 MRI of lumbar, multilevel degenerative disc disease, most severe at L3-4, L4-5, mild foraminal stenosis, no significant canal stenosis  UPDATE Jul 08 2016: Dale Edwards complains of increased right arm/leg shaking, increased gait abnormality, continue to get worse, Dale Edwards has no bowel and bladder incontinence. Dale Edwards does have urinary urgency.   Dale Edwards has significant right hand dysmetria, slow worsening gait abnormality, I personally reviewed MRI of the brain  in May 2016, multiple periventricular MS lesions, no change compared to 2015, MRI cervical lesions, ill defined spinal cord hyperintensity lesion at C2-3, likely chronic demyelinating plaque, MRI lumbar multilevel degenerative disc  diseasesignificant foraminal canal stenosis.  After discuss with patient we decided to proceed with Tysarbri infusion, potential side effect explained  Update October 14 2016: JC virus titer was 0.67 inDecember 2017, Dale Edwards was switched to [place] infusion, the first one was in February 2018, will have repeat infusion in October 15 2016. Dale Edwards tolerated the infusion are well, no significant side effect noticed. Dale Edwards can walk better, feels more energetic  I reviewed laboratory evaluations, in December 2017, low vitamin D 7.1, varicella-zoster IgG 2396, CBC showed elevated hemoglobin 7.9, normal CPK, CMP,  I have personally reviewed MRI of brain with and without contrast in December, in comparison to previous scan in May 2016, moderate supratentorium lesions noted, no contrast enhancement  No significant changes.   UPDATE Apr 13 2018: JCV positive 0.84 indexin April 2019. (last JCV 0.67 positive 06/2016).Normal CBC, CMP  Dale Edwards is tolerating Tysarbri infusion well, no significant side effect noted, continue have gait abnormality, We personally reviewed MRI of the brainwith andwithout contrast in April 2019, multiple T2/flair hyperintensity bilateral hemisphere, deep gray matter, cerebellum, consistent with chronic demyelinating, there was no change compared to previous scan in December 2017, moderate generalized atrophy, no contrast-enhancement  UPDATE October 12 2019: Dale Edwards came in to follow-up his Relapsing Remitting Multiple Sclerosis, continue to have significant gait abnormality, severe right upper extremity dysmetria, also right leg shaking in a standing still position, there was no significant worsening,  We personally reviewed MRI of the brain with without contrast in October 2020, moderate periventricular subcortical chronic demyelinating disease, no enhancement, no change compared to previous MRI in April 2019. MRI of cervical spine with without contrast in 2016 showed ill-defined spinal cord  hyper intensity posteriorly at C2-3, consistent with chronic demyelinating plaque, no contrast-enhancement, multilevel degenerative changes, no evidence of spinal cord compression,  Dale Edwards is receiving Tysabri monthly through [facility], tolerating it well,  Update September 26, 2020 Dale Edwards is overall stable, only slow decline since last visit, complains of worsening right hand action tremor, mild increased gait abnormality, ambulate with a cane Dale Edwards has continued on Tysabri  We again personally reviewed MRI of the brain with without contrast October 2020, stable moderate supratentorium MS plaque, no contrast-enhancement,  REVIEW OF SYSTEMS: Out of a complete 14 system review of symptoms, the patient complains only of the following symptoms, and all other reviewed systems are negative.  Numbness, tremors  ALLERGIES: Allergies  Allergen Reactions  . Hydrocodone-Acetaminophen Itching and Nausea Only         HOME MEDICATIONS: Outpatient Medications Prior to Visit  Medication Sig Dispense Refill  . albuterol (PROVENTIL HFA;VENTOLIN HFA) 108 (90 BASE) MCG/ACT inhaler Inhale 2 puffs into the lungs every 6 (six) hours as needed.    . cholecalciferol (VITAMIN D) 1000 units tablet Take 1,000 Units by mouth daily.    . propranolol (INDERAL) 40 MG tablet Take 1 tablet (40 mg total) by mouth 2 (two) times daily. 60 tablet 11  . TYSABRI 300 MG/15ML injection INFUSE 300MG  IV PER  PRESCRIBING INFORMATION  EVERY 4 WEEKS AS DIRECTED  (GIVEN AT MD OFFICE,  DISCARD UNUSED) 15 mL 2  . UNABLE TO FIND Med Name:  Oxygen 2L/[place] at night.     No facility-administered medications prior to visit.    PAST MEDICAL HISTORY: Past Medical History:  Diagnosis Date  .  Back pain   . COPD (chronic obstructive pulmonary disease) (HCC)   . Multiple sclerosis (HCC)   . Tremors of nervous system    Right    PAST SURGICAL HISTORY: Past Surgical History:  Procedure Laterality Date  . ADENOIDECTOMY    . broken leg    .  TONSILLECTOMY      FAMILY HISTORY: Family History  Problem Relation Age of Onset  . Heart Problems Mother     SOCIAL HISTORY: Social History   Socioeconomic History  . Marital status: Single    Spouse name: Not on file  . Number of children: 0  . Years of education: GED  . Highest education level: Not on file  Occupational History    Employer: UNEMPLOYED    Comment: Disabled  Tobacco Use  . Smoking status: Current Every Day Smoker    Packs/day: 0.50    Types: Cigarettes  . Smokeless tobacco: Never Used  . Tobacco comment: Half Pack  Substance and Sexual Activity  . Alcohol use: Yes    Alcohol/week: 1.0 standard drink    Types: 1 Standard drinks or equivalent per week    Comment: quit 2 years ago..   pt is now drinking on occ - very rare maybe once a month.  . Drug use: No  . Sexual activity: Not on file  Other Topics Concern  . Not on file  Social History Narrative   Patient lives at home with Rogene Houston.   Patient does not have a significant other.    Patient is disabled.   Patient has no children.    Patient has his GED   Right handed.   Caffeine - four glasses and some sodas   Social Determinants of Health   Financial Resource Strain: Not on file  Food Insecurity: Not on file  Transportation Needs: Not on file  Physical Activity: Not on file  Stress: Not on file  Social Connections: Not on file  Intimate Partner Violence: Not on file      PHYSICAL EXAM  Vitals:   09/26/20 1323  BP: 125/80  Pulse: 72  Weight: 170 lb 8 oz (77.3 kg)  Height: 5' 6.5" (1.689 m)   Body mass index is 27.11 kg/m.   PHYSICAL EXAMNIATION:  Gen: NAD, conversant, well nourised, well groomed       NEUROLOGICAL EXAM:  MENTAL STATUS: Speech/cognition: Awake alert oriented to history taking care of conversation   CRANIAL NERVES: CN II: Visual fields are full to confrontation.  Pupils are round equal and briskly reactive to light. CN III, IV, VI: extraocular  movement are normal. No ptosis. CN V: Facial sensation is intact to pinprick in all 3 divisions bilaterally. Corneal responses are intact.  CN VII: Face is symmetric with normal eye closure and smile. CN VIII: Hearing is normal to casual conversation CN IX, X: Palate elevates symmetrically. Phonation is normal. CN XI: Head turning and shoulder shrug are intact   MOTOR: Fixation of right upper extremity upon right upper extremity upon rapid rotating movement, severe right upper extremity dysmetria,  REFLEXES: Hyperreflexia  SENSORY: Intact to light touch   COORDINATION: Significant right upper extremity finger-to-nose dysmetria, mild right heel-to-shin dysmetria, mild to moderate truncal ataxia  GAIT/STANCE: Dale Edwards needs to rely on his cane to get up from seated position, large amplitude to right lower extremity tremor, wide-based unsteady gait   DIAGNOSTIC DATA (LABS, IMAGING, TESTING) - I reviewed patient records, labs, notes, testing and imaging myself where available.  Lab  Results  Component Value Date   WBC 11.2 (H) 05/30/2020   HGB 18.0 (H) 05/30/2020   HCT 51.4 (H) 05/30/2020   MCV 98 (H) 05/30/2020   PLT 205 05/30/2020      Component Value Date/Time   NA 139 04/12/2019 1452   K 4.5 04/12/2019 1452   CL 102 04/12/2019 1452   CO2 23 04/12/2019 1452   GLUCOSE 87 04/12/2019 1452   GLUCOSE 99 03/25/2014 2030   BUN 8 04/12/2019 1452   CREATININE 0.83 04/12/2019 1452   CALCIUM 8.7 04/12/2019 1452   PROT 6.1 04/12/2019 1452   ALBUMIN 4.3 04/12/2019 1452   AST 16 04/12/2019 1452   ALT 19 04/12/2019 1452   ALKPHOS 84 04/12/2019 1452   BILITOT 1.7 (H) 04/12/2019 1452   GFRNONAA 95 04/12/2019 1452   GFRAA 110 04/12/2019 1452   Lab Results  Component Value Date   CHOL 231 (H) 01/08/2013   HDL 32 (L) 01/08/2013   LDLCALC 146 (H) 01/08/2013   TRIG 267 (H) 01/08/2013   CHOLHDL 7.2 (H) 01/08/2013   No results found for: HGBA1C Lab Results  Component Value Date    VITAMINB12 228 01/08/2013   Lab Results  Component Value Date   TSH 1.770 11/02/2019    ASSESSMENT AND PLAN 65 y.o. year old male    Relapsing remitting multiple Sclerosis   Tecfidera August 2014 to January 2018,  Tysabri since February 2018  Have discussed other treatment option such as ocrelizumab, patient designed to stay on Tysabri monthly infusions  Repeat MRI of the brain, cervical spine with without contrast,  Laboratory evaluation including JC antibody titer     Gait abnormality Right hand dysmetria  Dale Edwards has truncal ataxia, limb dysmetria, especially right finger-to-nose, has significant end of target dysmetria,  Those findings are most likely related to his brainstem/cerebellum multiple sclerosis lesion, likely refractory to treatment,    Levert Feinstein, M.D. Ph.D.  [facility] [address] [place], [place] [address] Phone: [phone] Fax:      [phone]

## 2020-09-27 ENCOUNTER — Telehealth: Payer: Self-pay | Admitting: Neurology

## 2020-09-27 NOTE — Telephone Encounter (Signed)
[employer] medicare/medicaid order sent to [facility]. No auth they will reach out to the patient to schedule.  °

## 2020-10-09 ENCOUNTER — Telehealth: Payer: Self-pay | Admitting: Neurology

## 2020-10-09 ENCOUNTER — Telehealth: Payer: Self-pay | Admitting: *Deleted

## 2020-10-09 LAB — CBC WITH DIFFERENTIAL/PLATELET
Basophils Absolute: 0.1 10*3/uL (ref 0.0–0.2)
Basos: 1 %
EOS (ABSOLUTE): 0.3 10*3/uL (ref 0.0–0.4)
Eos: 4 %
Hematocrit: 51.4 % — ABNORMAL HIGH (ref 37.5–51.0)
Hemoglobin: 17.1 g/dL (ref 13.0–17.7)
Immature Grans (Abs): 0 10*3/uL (ref 0.0–0.1)
Immature Granulocytes: 1 %
Lymphocytes Absolute: 2.4 10*3/uL (ref 0.7–3.1)
Lymphs: 29 %
MCH: 32.1 pg (ref 26.6–33.0)
MCHC: 33.3 g/dL (ref 31.5–35.7)
MCV: 96 fL (ref 79–97)
Monocytes Absolute: 0.6 10*3/uL (ref 0.1–0.9)
Monocytes: 7 %
Neutrophils Absolute: 4.9 10*3/uL (ref 1.4–7.0)
Neutrophils: 58 %
Platelets: 236 10*3/uL (ref 150–450)
RBC: 5.33 x10E6/uL (ref 4.14–5.80)
RDW: 12.6 % (ref 11.6–15.4)
WBC: 8.3 10*3/uL (ref 3.4–10.8)

## 2020-10-09 LAB — COMPREHENSIVE METABOLIC PANEL
ALT: 21 IU/L (ref 0–44)
AST: 14 IU/L (ref 0–40)
Albumin/Globulin Ratio: 1.8 (ref 1.2–2.2)
Albumin: 4.1 g/dL (ref 3.8–4.8)
Alkaline Phosphatase: 102 IU/L (ref 44–121)
BUN/Creatinine Ratio: 10 (ref 10–24)
BUN: 8 mg/dL (ref 8–27)
Bilirubin Total: 0.9 mg/dL (ref 0.0–1.2)
CO2: 22 mmol/L (ref 20–29)
Calcium: 9.5 mg/dL (ref 8.6–10.2)
Chloride: 102 mmol/L (ref 96–106)
Creatinine, Ser: 0.82 mg/dL (ref 0.76–1.27)
Globulin, Total: 2.3 g/dL (ref 1.5–4.5)
Glucose: 84 mg/dL (ref 65–99)
Potassium: 4.4 mmol/L (ref 3.5–5.2)
Sodium: 141 mmol/L (ref 134–144)
Total Protein: 6.4 g/dL (ref 6.0–8.5)
eGFR: 99 mL/min/{1.73_m2} (ref >59)

## 2020-10-09 LAB — STRATIFY JCV(TM) AB W/INDEX
JCV Antibody: POSITIVE — AB
JCV Index Value: 0.64

## 2020-10-09 LAB — TSH: TSH: 4.36 u[IU]/mL (ref 0.450–4.500)

## 2020-10-09 NOTE — Telephone Encounter (Signed)
Left patient a detailed message, with results, on voicemail (ok per DPR).  Provided our number to call back with any questions.  

## 2020-10-09 NOTE — Telephone Encounter (Signed)
Labs collected 09/26/20:  JCV ab 0.59 positive  Dr. Terrace Arabia has reviewed. Copy of lab report sent to scanning.

## 2020-10-09 NOTE — Telephone Encounter (Signed)
Please call patient,Laboratory evaluation showed no significant abnormality, mild positive JC virus antibody, continue current treatment

## 2020-10-25 ENCOUNTER — Other Ambulatory Visit: Payer: Self-pay | Admitting: *Deleted

## 2020-10-25 ENCOUNTER — Encounter: Payer: Self-pay | Admitting: *Deleted

## 2020-10-25 ENCOUNTER — Telehealth: Payer: Self-pay | Admitting: *Deleted

## 2020-10-25 DIAGNOSIS — G35 Multiple sclerosis: Secondary | ICD-10-CM

## 2020-10-25 DIAGNOSIS — R269 Unspecified abnormalities of gait and mobility: Secondary | ICD-10-CM

## 2020-10-25 NOTE — Telephone Encounter (Signed)
The patient called and notified [facility] that his significant other has passed away. Rogene Houston was the only person listed on his DPR and as his emergency contact. He informed Liane that he would be keeping the same number for now. I have put a note in his chart for [place] to obtain a new DPR. Hopefully, he has a friend who will be able to step in and help. He does not have any children. I am unsure about other family members.  He also indicated to Liane that he has transportation issues and actually had to cancel his Tysabri appointment on 10/31/20. Per vo by Dr. Terrace Arabia, a home health referral has been placed with the instructions to assess for all disciplines and provide services as soon as possible. We specifically requested this to include nursing and a Child psychotherapist.  I tried to contact the patient by phone but it goes straight to voicemail and it is full. I have no other way to contact him by phone. Liane will let me know if he calls [facility] so that I cal speak to him. In the meantime,  I will send a letter to his home address.

## 2020-11-02 ENCOUNTER — Telehealth: Payer: Self-pay | Admitting: *Deleted

## 2020-11-02 NOTE — Telephone Encounter (Signed)
The patient's primary caregiver has recently passed away. He canceled his appt for Tysabri shortly after this occurred because he was not able to get to our office. We have reached out to him several times by phone. He does not have mychart set up. We mailed a letter to this home. He has still not called back. A home health referral was placed so his medical and transportation needs can be addressed. I called him again today. This time, I was able to leave a message requesting a call back.

## 2020-11-05 ENCOUNTER — Other Ambulatory Visit: Payer: Self-pay | Admitting: Neurology

## 2020-11-07 NOTE — Telephone Encounter (Signed)
I left another message today requesting a call back.

## 2020-11-07 NOTE — Telephone Encounter (Signed)
After numerous failed attempts to reach the patient, we have called the non-emergency police line to request a welfare check on this patient.

## 2020-11-08 NOTE — Telephone Encounter (Signed)
The police department returned my call. The patient is doing okay. His caregiver's daughter is going to step in and help with his care Mamie Levers [phone]).  He now has an appt to get his Tysabri infusion on 11/14/20 at 2pm.   He is in the process of getting a new phone and they will provide the number once it is activated.   Marylene Land also plans to help coordinate the appt for his home health evaluation.   Dr. Terrace Arabia has been provided with this update.

## 2020-11-14 NOTE — Telephone Encounter (Signed)
I spoke to Boody again. She was unable to bring him to our office today b/c her car is still in the shop. She needs to r/s his appt. His Tysabri has been rescheduled to 11/23/20 at 2pm. She has not heard from home health services yet but is aware to expect a call from them soon.

## 2020-11-14 NOTE — Telephone Encounter (Signed)
Noted Dale Edwards I reached out to [place] again .  To see what the status is . On Home Health .  Thanks Annabelle Harman

## 2020-11-20 NOTE — Telephone Encounter (Signed)
Called to do a follow to see if home Health Called PT . Spoke to PT girl Friend . PT Declined Referral .

## 2020-11-20 NOTE — Telephone Encounter (Signed)
Marcelino Duster Just FYI Update  Followed up because I was out of the office .   Called to do a follow to see if home Health Called PT . Spoke to PT girl Friend . PT Declined Referral .

## 2020-11-20 NOTE — Telephone Encounter (Signed)
I called and spoke to his friend, Mamie Levers, who says the patient has declined home health at his time. She understands that the services are recommended and the referral is still available to him. She is going to talk with him further about it.

## 2020-11-23 ENCOUNTER — Other Ambulatory Visit: Payer: Self-pay | Admitting: *Deleted

## 2020-12-18 ENCOUNTER — Other Ambulatory Visit: Payer: Self-pay | Admitting: *Deleted

## 2020-12-18 MED ORDER — TYSABRI 300 MG/15ML IV CONC: INTRAVENOUS | 2 refills | Status: DC

## 2021-02-12 ENCOUNTER — Telehealth: Payer: Self-pay | Admitting: Neurology

## 2021-02-12 MED ORDER — TYSABRI 300 MG/15ML IV CONC: INTRAVENOUS | 2 refills | Status: DC

## 2021-02-12 NOTE — Telephone Encounter (Signed)
Dale Edwards from [facility] delivery called requesting refill for pt's TYSABRI 300 MG/15ML injection.

## 2021-04-10 ENCOUNTER — Ambulatory Visit (INDEPENDENT_AMBULATORY_CARE_PROVIDER_SITE_OTHER): Payer: Medicare Other | Admitting: Neurology

## 2021-04-10 ENCOUNTER — Encounter: Payer: Self-pay | Admitting: Neurology

## 2021-04-10 VITALS — BP 119/79 | HR 71 | Ht 66.0 in | Wt 160.0 lb

## 2021-04-10 DIAGNOSIS — R269 Unspecified abnormalities of gait and mobility: Secondary | ICD-10-CM | POA: Diagnosis not present

## 2021-04-10 DIAGNOSIS — G35 Multiple sclerosis: Secondary | ICD-10-CM | POA: Diagnosis not present

## 2021-04-10 NOTE — Progress Notes (Signed)
Chief Complaint  Patient presents with   Follow-up    Rm 16, alone, reports no changes, pt had no new questions or concerns       ASSESSMENT AND PLAN  Dale Edwards is a 65 y.o. male   Multiple sclerosis  Clinical and imaging wise has been stable,  Even after stopped Tysabri since March 2022,  He has transportation issues,  Will repeat MRI of the brain and cervical spine with without contrast,  Return to clinic in 6 months, he had MS for more than a decade, if there is no significant change without immunomodulation therapy, may give him a chance of treatment free, if there are any clinical and imaging worsening, may consider ocrelizumab   DIAGNOSTIC DATA (LABS, IMAGING, TESTING) - I reviewed patient records, labs, notes, testing and imaging myself where available.   MEDICAL HISTORY: He presented with right hand difficulty since 2010, he complains of right hand shaking, especially when he trying to use his right hand, such as holding a utensil, he has to hold his spoon like a child to prevent the food from spitting out, progressively worse over the past one year, he also noticed right leg shaking, especially when he bearing weight since 2013, mild gait difficulty dragging his right leg, he suffered a sudden fell to his right leg in 2007, with right tibial fracture,   He has chronic low back pain, but denies significant shooting pain from back to his lower extremity, he denies bilateral extremity paresthesia, he denies bowel and bladder   MRI brain 2014 showed mild-moderate periventricular and subcortical round and ovoid T2 hyperintensities, some of which are confluent.   MRI cervical 2014, spine At C2-C3, there is posterior central T2 hyperintense spinal cord lesion.   Above findings are consistent with multiple sclerosis.    He has no clear clinical relapsing and remitting, over the past few years, continue gradually worsening gait difficulty especially the right arm and the  right leg difficulty.    Laboratory evaluation showed normal or negative vitamin B12, RPR, folic acid, ANA, HIV, C. reactive protein, TSH, CPK, Normal or negative Lyme titer, ACE level,   hepatitis panel,    Spinal fluid testing showed WBC 1, glucose 44, total protein of 56, more than 5 oligoclonal banding, and increased IgG synthetic rate, increased IgG index,    He had a past medical history of chronic low back pain, right leg pain, knee pain, COPD, has been on disability since March 2014 due to physical difficulty, right side difficulty   He was started on tecfidera since August  2014.   JC virus was positive with titer of 0.8 4 in August 01 2014    He was switched to Tysabri IV infusion since February 2018,    There was no significant flareups over the years, he has slow decline in his functional status, in creased right hand shaking, gait abnormalities   Personally reviewed MRI of the brain with without contrast October 2020, stable moderate supratentorium MS plaque, no contrast-enhancement,  MRI of the cervical spine in May 2016, ill-defined spinal cord hyperintensity posterior at C2-3, likely chronic demyelinating plaque, no enhancing lesions, mild degenerative changes  He has lost his significant other since March 2022, no longer has transportation, was not able to continue his Tysabri infusion since March, he did not notice any significant decline in his functional status,  PHYSICAL EXAM:   Vitals:   04/10/21 1447  BP: 119/79  Pulse: 71  Weight: 160 lb (  72.6 kg)  Height: 5\' 6"  (1.676 m)   Not recorded     Body mass index is 25.82 kg/m.  PHYSICAL EXAMNIATION:  Gen: NAD, conversant, well nourised, well groomed       NEUROLOGICAL EXAM:  MENTAL STATUS: Speech:    Speech is normal; fluent and spontaneous with normal comprehension.  Cognition:     Orientation to time, place and person     Normal recent and remote memory     Normal Attention span and  concentration     Normal Language, naming, repeating,spontaneous speech     Fund of knowledge   CRANIAL NERVES: CN II: Visual fields are full to confrontation. Pupils are round equal and briskly reactive to light. CN III, IV, VI: extraocular movement are normal. No ptosis. CN V: Facial sensation is intact to light touch CN VII: Face is symmetric with normal eye closure  CN VIII: Hearing is normal to causal conversation. CN IX, X: Phonation is normal. CN XI: Head turning and shoulder shrug are intact  MOTOR: Right arm action tremor, no significant weakness I am  REFLEXES: Reflexes are 2+ and symmetric at the biceps, triceps, knees, and ankles. Plantar responses are flexor.  SENSORY: Intact to light touch, pinprick and vibratory sensation are intact in fingers and toes.  COORDINATION: Significant right finger-to-nose dysmetria, no heel-to-shin dysmetria,  GAIT/STANCE: Needs push-up to get up from seated position, dragging right leg some  REVIEW OF SYSTEMS:  Full 14 system review of systems performed and notable only for as above All other review of systems were negative.   ALLERGIES: Allergies  Allergen Reactions   Hydrocodone-Acetaminophen Itching and Nausea Only         HOME MEDICATIONS: Current Outpatient Medications  Medication Sig Dispense Refill   albuterol (PROVENTIL HFA;VENTOLIN HFA) 108 (90 BASE) MCG/ACT inhaler Inhale 2 puffs into the lungs every 6 (six) hours as needed.     cholecalciferol (VITAMIN D) 1000 units tablet Take 1,000 Units by mouth daily.     propranolol (INDERAL) 40 MG tablet Take 1 tablet (40 mg total) by mouth 2 (two) times daily. 60 tablet 11   TYSABRI 300 MG/15ML injection INFUSE 300MG  IV PER  PRESCRIBING INFORMATION  EVERY 4 WEEKS AS DIRECTED  (GIVEN AT MD OFFICE,  DISCARD UNUSED) 15 mL 2   UNABLE TO FIND Med Name:  Oxygen 2L/[place] at night.     No current facility-administered medications for this visit.    PAST MEDICAL HISTORY: Past Medical  History:  Diagnosis Date   Back pain    COPD (chronic obstructive pulmonary disease) (HCC)    Multiple sclerosis (HCC)    Tremors of nervous system    Right    PAST SURGICAL HISTORY: Past Surgical History:  Procedure Laterality Date   ADENOIDECTOMY     broken leg     TONSILLECTOMY      FAMILY HISTORY: Family History  Problem Relation Age of Onset   Heart Problems Mother     SOCIAL HISTORY: Social History   Socioeconomic History   Marital status: Single    Spouse name: Not on file   Number of children: 0   Years of education: GED   Highest education level: Not on file  Occupational History    Employer: UNEMPLOYED    Comment: Disabled  Tobacco Use   Smoking status: Every Day    Packs/day: 0.50    Types: Cigarettes   Smokeless tobacco: Never   Tobacco comments:    Half Pack  Substance and Sexual Activity   Alcohol use: Yes    Alcohol/week: 1.0 standard drink    Types: 1 Standard drinks or equivalent per week    Comment: quit 2 years ago..   pt is now drinking on occ - very rare maybe once a month.   Drug use: No   Sexual activity: Not on file  Other Topics Concern   Not on file  Social History Narrative   Patient lives at home with Rogene Houston.   Patient does not have a significant other.    Patient is disabled.   Patient has no children.    Patient has his GED   Right handed.   Caffeine - four glasses and some sodas   Social Determinants of Health   Financial Resource Strain: Not on file  Food Insecurity: Not on file  Transportation Needs: Not on file  Physical Activity: Not on file  Stress: Not on file  Social Connections: Not on file  Intimate Partner Violence: Not on file      Levert Feinstein, M.D. Ph.D.  [facility] [address], [address] [place], [place] [address] Ph: [phone] Fax: [phone]  CC:  Rometta Emery, MD [address] [place],  [place] [address]  Rometta Emery, MD

## 2021-04-11 ENCOUNTER — Telehealth: Payer: Self-pay | Admitting: Neurology

## 2021-04-11 NOTE — Telephone Encounter (Signed)
[employer] medicare/medicaid order sent to [facility]. NPR they will reach out to the patient to schedule.  

## 2021-05-01 ENCOUNTER — Inpatient Hospital Stay: Admission: RE | Admit: 2021-05-01 | Payer: Medicare Other | Source: Ambulatory Visit

## 2021-05-03 ENCOUNTER — Telehealth: Payer: Self-pay | Admitting: Neurology

## 2021-05-03 NOTE — Telephone Encounter (Signed)
Received discontinuation questionnaire from [employer]. Completed and signed by Dr. Terrace Arabia. Faxed back to [phone].

## 2021-05-03 NOTE — Telephone Encounter (Signed)
Per last note, the patient is no longer on Tysabri. I returned the call to [employer] and provided this information. They will update his profile.

## 2021-05-03 NOTE — Telephone Encounter (Signed)
Michelle from [employer] called wanting to confirm if the pt has stopped their Tysabri. Last appt was 01/03/21 and pt is having issues getting transportation. Please advise.

## 2021-07-09 ENCOUNTER — Emergency Department (HOSPITAL_COMMUNITY)
Admission: EM | Admit: 2021-07-09 | Discharge: 2021-07-10 | Disposition: A | Discharge status: Home / Self Care | Payer: Medicare Other | Source: Home / Self Care | Attending: Emergency Medicine | Admitting: Emergency Medicine

## 2021-07-09 ENCOUNTER — Emergency Department (HOSPITAL_COMMUNITY)
Admission: EM | Admit: 2021-07-09 | Discharge: 2021-07-09 | Discharge status: Against Medical Advice | Payer: Medicare Other

## 2021-07-09 ENCOUNTER — Encounter (HOSPITAL_COMMUNITY): Payer: Self-pay | Admitting: Emergency Medicine

## 2021-07-09 ENCOUNTER — Other Ambulatory Visit: Payer: Self-pay

## 2021-07-09 DIAGNOSIS — J690 Pneumonitis due to inhalation of food and vomit: Secondary | ICD-10-CM | POA: Diagnosis not present

## 2021-07-09 DIAGNOSIS — J449 Chronic obstructive pulmonary disease, unspecified: Secondary | ICD-10-CM | POA: Insufficient documentation

## 2021-07-09 DIAGNOSIS — K922 Gastrointestinal hemorrhage, unspecified: Secondary | ICD-10-CM | POA: Diagnosis not present

## 2021-07-09 DIAGNOSIS — F17210 Nicotine dependence, cigarettes, uncomplicated: Secondary | ICD-10-CM | POA: Insufficient documentation

## 2021-07-09 DIAGNOSIS — K625 Hemorrhage of anus and rectum: Secondary | ICD-10-CM

## 2021-07-09 DIAGNOSIS — K644 Residual hemorrhoidal skin tags: Secondary | ICD-10-CM | POA: Insufficient documentation

## 2021-07-09 LAB — CBC WITH DIFFERENTIAL/PLATELET
Abs Immature Granulocytes: 0.04 10*3/uL (ref 0.00–0.07)
Basophils Absolute: 0.1 10*3/uL (ref 0.0–0.1)
Basophils Relative: 1 %
Eosinophils Absolute: 0 10*3/uL (ref 0.0–0.5)
Eosinophils Relative: 0 %
HCT: 52.5 % — ABNORMAL HIGH (ref 39.0–52.0)
Hemoglobin: 17.5 g/dL — ABNORMAL HIGH (ref 13.0–17.0)
Immature Granulocytes: 0 %
Lymphocytes Relative: 11 %
Lymphs Abs: 1.5 10*3/uL (ref 0.7–4.0)
MCH: 31.6 pg (ref 26.0–34.0)
MCHC: 33.3 g/dL (ref 30.0–36.0)
MCV: 94.8 fL (ref 80.0–100.0)
Monocytes Absolute: 0.8 10*3/uL (ref 0.1–1.0)
Monocytes Relative: 6 %
Neutro Abs: 11 10*3/uL — ABNORMAL HIGH (ref 1.7–7.7)
Neutrophils Relative %: 82 %
Platelets: 244 10*3/uL (ref 150–400)
RBC: 5.54 MIL/uL (ref 4.22–5.81)
RDW: 13.3 % (ref 11.5–15.5)
WBC: 13.5 10*3/uL — ABNORMAL HIGH (ref 4.0–10.5)
nRBC: 0 % (ref 0.0–0.2)

## 2021-07-09 LAB — ABO/RH: ABO/RH(D): A POS

## 2021-07-09 LAB — TYPE AND SCREEN
ABO/RH(D): A POS
Antibody Screen: NEGATIVE

## 2021-07-09 LAB — COMPREHENSIVE METABOLIC PANEL
ALT: 25 U/L (ref 0–44)
AST: 38 U/L (ref 15–41)
Albumin: 4 g/dL (ref 3.5–5.0)
Alkaline Phosphatase: 92 U/L (ref 38–126)
Anion gap: 8 (ref 5–15)
BUN: 19 mg/dL (ref 8–23)
CO2: 27 mmol/L (ref 22–32)
Calcium: 9 mg/dL (ref 8.9–10.3)
Chloride: 101 mmol/L (ref 98–111)
Creatinine, Ser: 1.07 mg/dL (ref 0.61–1.24)
GFR, Estimated: >60 mL/min (ref >60)
Glucose, Bld: 94 mg/dL (ref 70–99)
Potassium: 4.3 mmol/L (ref 3.5–5.1)
Sodium: 136 mmol/L (ref 135–145)
Total Bilirubin: 3.5 mg/dL — ABNORMAL HIGH (ref 0.3–1.2)
Total Protein: 6.6 g/dL (ref 6.5–8.1)

## 2021-07-09 LAB — PROTIME-INR
INR: 1 (ref 0.8–1.2)
Prothrombin Time: 13.2 seconds (ref 11.4–15.2)

## 2021-07-09 NOTE — ED Provider Notes (Signed)
Emergency Medicine Provider Triage Evaluation Note  Dale Edwards , a 65 y.o. male  was evaluated in triage.  Pt complains of rectal bleeding. BRBPR when he wipes x 1 episode just PTA. Does have known hemorrhoids. No blood in stool. Has some rectal pain as well. No anticoagulation, lightheadedness, dizziness. No prior colonoscopies. No changes in bowel movements.  Review of Systems  Positive: Rectal bleeding Negative: Lightheadedness, dizziness, abdominal pain  Physical Exam  BP 111/63   Pulse 78   Temp 98.3 F (36.8 C)   Resp 17   SpO2 98%  Gen:   Awake, no distress   Resp:  Normal effort  MSK:   Moves extremities without difficulty  Other:    Medical Decision Making  Medically screening exam initiated at 10:20 PM.  Appropriate orders placed.  Mostafa Yuan was informed that the remainder of the evaluation will be completed by another provider, this initial triage assessment does not replace that evaluation, and the importance of remaining in the ED until their evaluation is complete.  Rectal bleeding, stable VS   Missy Baksh A, PA-C 07/09/21 2224    Maia Plan, MD 07/12/21 1535

## 2021-07-09 NOTE — ED Triage Notes (Signed)
Patient here with bleeding from his rectum.  He states that when he wipes his bottom, he is having bleeding.  He states that he does have some hemorrhoids.

## 2021-07-09 NOTE — ED Notes (Signed)
Pt called to be triaged, no response  

## 2021-07-10 LAB — POC OCCULT BLOOD, ED: Fecal Occult Bld: POSITIVE — AB

## 2021-07-10 NOTE — ED Provider Notes (Addendum)
[facility] EMERGENCY DEPARTMENT Provider Note   CSN: [id number] Arrival date & time: 07/09/21  2127     History Chief Complaint  Patient presents with   GI Bleeding    Dale Edwards is a 65 y.o. male.  HPI Patient is a 65 year old male with a history of COPD, MS, hemorrhoids, who presents to the emergency department due to rectal bleeding.  Patient states earlier today he noticed bright red blood per rectum when wiping after having a normal bowel movement.  States he was not straining more than normal.  Reports a known history of hemorrhoids.  Denies any blood in the stool or in the toilet.  No melena.  Denies being anticoagulated.  Denies lightheadedness, dizziness, chest pain, shortness of breath, abdominal pain, nausea, vomiting, fevers.    Past Medical History:  Diagnosis Date   Back pain    COPD (chronic obstructive pulmonary disease) (HCC)    Multiple sclerosis (HCC)    Tremors of nervous system    Right    Patient Active Problem List   Diagnosis Date Noted   Relapsing remitting multiple sclerosis (HCC) 10/12/2019   Tremor of right hand 10/12/2019   Gait abnormality 10/12/2019   Tremor 10/30/2017   Vitamin D deficiency 04/10/2017   Lumbar radiculopathy 11/15/2014   Low back pain 08/01/2014   Multiple sclerosis (HCC) 02/09/2013   Abnormality of gait 01/08/2013   Right sided weakness 01/08/2013   EMPHYSEMA 02/17/2009   DYSPNEA 02/17/2009   Nonspecific (abnormal) findings on radiological and other examination of body structure 02/17/2009   CT, CHEST, ABNORMAL 02/17/2009    Past Surgical History:  Procedure Laterality Date   ADENOIDECTOMY     broken leg     TONSILLECTOMY         Family History  Problem Relation Age of Onset   Heart Problems Mother     Social History   Tobacco Use   Smoking status: Every Day    Packs/day: 0.50    Types: Cigarettes   Smokeless tobacco: Never   Tobacco comments:    Half Pack  Substance Use  Topics   Alcohol use: Yes    Alcohol/week: 1.0 standard drink    Types: 1 Standard drinks or equivalent per week    Comment: quit 2 years ago..   pt is now drinking on occ - very rare maybe once a month.   Drug use: No    Home Medications Prior to Admission medications   Medication Sig Start Date End Date Taking? Authorizing Provider  propranolol (INDERAL) 40 MG tablet Take 1 tablet (40 mg total) by mouth 2 (two) times daily. Patient not taking: Reported on 07/10/2021 10/13/19   Levert Feinstein, MD  TYSABRI 300 MG/15ML injection INFUSE 300MG  IV PER  PRESCRIBING INFORMATION  EVERY 4 WEEKS AS DIRECTED  (GIVEN AT MD OFFICE,  DISCARD UNUSED) Patient not taking: Reported on 07/10/2021 02/12/21   02/14/21, MD    Allergies    Hydrocodone-acetaminophen  Review of Systems   Review of Systems  All other systems reviewed and are negative. Ten systems reviewed and are negative for acute change, except as noted in the HPI.   Physical Exam Updated Vital Signs BP 112/73   Pulse 90   Temp 98.3 F (36.8 C)   Resp 18   SpO2 99%   Physical Exam Vitals and nursing note reviewed.  Constitutional:      General: He is not in acute distress.    Appearance: Normal appearance.  He is normal weight. He is not ill-appearing, toxic-appearing or diaphoretic.  HENT:     Head: Normocephalic and atraumatic.     Right Ear: External ear normal.     Left Ear: External ear normal.     Nose: Nose normal.     Mouth/Throat:     Mouth: Mucous membranes are moist.     Pharynx: Oropharynx is clear. No oropharyngeal exudate or posterior oropharyngeal erythema.  Eyes:     General: No scleral icterus.       Right eye: No discharge.        Left eye: No discharge.     Extraocular Movements: Extraocular movements intact.     Conjunctiva/sclera: Conjunctivae normal.  Cardiovascular:     Rate and Rhythm: Normal rate and regular rhythm.     Pulses: Normal pulses.     Heart sounds: Normal heart sounds. No murmur  heard.   No friction rub. No gallop.  Pulmonary:     Effort: Pulmonary effort is normal. No respiratory distress.     Breath sounds: Normal breath sounds. No stridor. No wheezing, rhonchi or rales.  Abdominal:     General: Abdomen is flat.     Palpations: Abdomen is soft.     Tenderness: There is no abdominal tenderness.     Comments: Abdomen is flat, soft, and nontender in all 4 quadrants.  Genitourinary:    Comments: Nursing chaperone present.  Multiple nontender nonthrombosed external hemorrhoids noted.  No bleeding noted in the perianal region.  Small amount of brown stool noted in the rectal vault.  No melena or hematochezia.  No tenderness appreciated throughout the exam. Musculoskeletal:        General: Normal range of motion.     Cervical back: Normal range of motion and neck supple. No tenderness.  Skin:    General: Skin is warm and dry.  Neurological:     General: No focal deficit present.     Mental Status: He is alert and oriented to person, place, and time.  Psychiatric:        Mood and Affect: Mood normal.        Behavior: Behavior normal.   ED Results / Procedures / Treatments   Labs (all labs ordered are listed, but only abnormal results are displayed) Labs Reviewed  CBC WITH DIFFERENTIAL/PLATELET - Abnormal; Notable for the following components:      Result Value   WBC 13.5 (*)    Hemoglobin 17.5 (*)    HCT 52.5 (*)    Neutro Abs 11.0 (*)    All other components within normal limits  COMPREHENSIVE METABOLIC PANEL - Abnormal; Notable for the following components:   Total Bilirubin 3.5 (*)    All other components within normal limits  PROTIME-INR  POC OCCULT BLOOD, ED  TYPE AND SCREEN  ABO/RH   EKG None  Radiology No results found.  Procedures Procedures   Medications Ordered in ED Medications - No data to display  ED Course  I have reviewed the triage vital signs and the nursing notes.  Pertinent labs & imaging results that were available  during my care of the patient were reviewed by me and considered in my medical decision making (see chart for details).    MDM Rules/Calculators/A&P                          Pt is a 65 y.o. male who presents to the emergency department due  to an episode of rectal bleeding that occurred prior to arrival.  Labs: CBC with a white count of 13.5, hemoglobin of 17.5, hematocrit of 52.5, neutrophils of 11. Total bilirubin 3.5. PT/INR within normal limits.  I, Rayna Sexton, PA-C, personally reviewed and evaluated these images and lab results as part of my medical decision-making.  On exam abdomen is soft and nontender.  Patient has multiple nonthrombosed nontender external hemorrhoids.  Possibly the source of his symptoms.  Small amount of brown stool noted in the rectal vault.  No melena or hematochezia.  Patient does have a leukocytosis of 13.5 with neutrophils of 11.  Patient afebrile and not tachycardic.  Patient denies any other systemic complaints.  Doubt infectious etiology at this time.  Did not feel that imaging was warranted at this visit and patient is agreeable.  Patient denies any lightheadedness, dizziness, chest pain, shortness of breath.  Vital signs are stable and reassuring.  Hemoglobin mildly elevated at 17.5 and appears to be stable compared to his prior values.  Feel the patient is stable for discharge at this time and he is agreeable.  Patient given very strict return precautions and he knows to return to the emergency department with any new or worsening symptoms.  Patient given a referral to GI.  His questions were answered and he was amicable at the time of discharge.  Note: Portions of this report may have been transcribed using voice recognition software. Every effort was made to ensure accuracy; however, inadvertent computerized transcription errors may be present.   Final Clinical Impression(s) / ED Diagnoses Final diagnoses:  Rectal bleeding   Rx / DC Orders ED  Discharge Orders     None        Rayna Sexton, PA-C 07/10/21 0557    Rayna Sexton, PA-C [id number] [phone]    Delora Fuel, MD [id number] [phone]

## 2021-07-10 NOTE — Discharge Instructions (Signed)
If you develop any new or worsening symptoms please come back to the emergency department.

## 2021-07-10 NOTE — ED Notes (Signed)
Discharge instructions reviewed with patient at this time. Patient verbally upset stating he was here for his MS flare up and his plan of care only focused on his hemorrhoids. Patient requesting to speak with EDP.

## 2021-07-12 ENCOUNTER — Emergency Department (HOSPITAL_COMMUNITY): Payer: Medicare Other

## 2021-07-12 ENCOUNTER — Encounter (HOSPITAL_COMMUNITY): Payer: Self-pay | Admitting: Family Medicine

## 2021-07-12 ENCOUNTER — Inpatient Hospital Stay (HOSPITAL_COMMUNITY)
Admission: EM | Admit: 2021-07-12 | Discharge: 2021-07-27 | DRG: 177 | Disposition: A | Discharge status: Home / Self Care | Payer: Medicare Other | Attending: Internal Medicine | Admitting: Internal Medicine

## 2021-07-12 ENCOUNTER — Inpatient Hospital Stay (HOSPITAL_COMMUNITY): Payer: Medicare Other

## 2021-07-12 DIAGNOSIS — F1010 Alcohol abuse, uncomplicated: Secondary | ICD-10-CM | POA: Diagnosis present

## 2021-07-12 DIAGNOSIS — G35 Multiple sclerosis: Secondary | ICD-10-CM | POA: Diagnosis present

## 2021-07-12 DIAGNOSIS — M6282 Rhabdomyolysis: Secondary | ICD-10-CM | POA: Diagnosis present

## 2021-07-12 DIAGNOSIS — G8929 Other chronic pain: Secondary | ICD-10-CM | POA: Diagnosis present

## 2021-07-12 DIAGNOSIS — Z5900 Homelessness unspecified: Secondary | ICD-10-CM

## 2021-07-12 DIAGNOSIS — Z886 Allergy status to analgesic agent status: Secondary | ICD-10-CM | POA: Diagnosis not present

## 2021-07-12 DIAGNOSIS — E8729 Other acidosis: Secondary | ICD-10-CM | POA: Diagnosis present

## 2021-07-12 DIAGNOSIS — J439 Emphysema, unspecified: Secondary | ICD-10-CM | POA: Diagnosis present

## 2021-07-12 DIAGNOSIS — Z79899 Other long term (current) drug therapy: Secondary | ICD-10-CM

## 2021-07-12 DIAGNOSIS — J449 Chronic obstructive pulmonary disease, unspecified: Secondary | ICD-10-CM

## 2021-07-12 DIAGNOSIS — Z5901 Sheltered homelessness: Secondary | ICD-10-CM

## 2021-07-12 DIAGNOSIS — R531 Weakness: Secondary | ICD-10-CM | POA: Diagnosis not present

## 2021-07-12 DIAGNOSIS — Z720 Tobacco use: Secondary | ICD-10-CM

## 2021-07-12 DIAGNOSIS — S0101XA Laceration without foreign body of scalp, initial encounter: Secondary | ICD-10-CM | POA: Diagnosis present

## 2021-07-12 DIAGNOSIS — Z20822 Contact with and (suspected) exposure to covid-19: Secondary | ICD-10-CM | POA: Diagnosis present

## 2021-07-12 DIAGNOSIS — R269 Unspecified abnormalities of gait and mobility: Secondary | ICD-10-CM

## 2021-07-12 DIAGNOSIS — R296 Repeated falls: Secondary | ICD-10-CM | POA: Diagnosis present

## 2021-07-12 DIAGNOSIS — R9389 Abnormal findings on diagnostic imaging of other specified body structures: Secondary | ICD-10-CM

## 2021-07-12 DIAGNOSIS — J9601 Acute respiratory failure with hypoxia: Secondary | ICD-10-CM | POA: Diagnosis present

## 2021-07-12 DIAGNOSIS — J690 Pneumonitis due to inhalation of food and vomit: Principal | ICD-10-CM | POA: Diagnosis present

## 2021-07-12 DIAGNOSIS — W1830XA Fall on same level, unspecified, initial encounter: Secondary | ICD-10-CM | POA: Diagnosis present

## 2021-07-12 DIAGNOSIS — R9431 Abnormal electrocardiogram [ECG] [EKG]: Secondary | ICD-10-CM

## 2021-07-12 DIAGNOSIS — R251 Tremor, unspecified: Secondary | ICD-10-CM

## 2021-07-12 DIAGNOSIS — E872 Acidosis, unspecified: Secondary | ICD-10-CM | POA: Diagnosis not present

## 2021-07-12 DIAGNOSIS — M899 Disorder of bone, unspecified: Secondary | ICD-10-CM | POA: Diagnosis not present

## 2021-07-12 DIAGNOSIS — R918 Other nonspecific abnormal finding of lung field: Secondary | ICD-10-CM | POA: Diagnosis not present

## 2021-07-12 DIAGNOSIS — S0093XA Contusion of unspecified part of head, initial encounter: Secondary | ICD-10-CM

## 2021-07-12 DIAGNOSIS — R0602 Shortness of breath: Secondary | ICD-10-CM

## 2021-07-12 DIAGNOSIS — F17210 Nicotine dependence, cigarettes, uncomplicated: Secondary | ICD-10-CM | POA: Diagnosis present

## 2021-07-12 DIAGNOSIS — Z885 Allergy status to narcotic agent status: Secondary | ICD-10-CM

## 2021-07-12 DIAGNOSIS — W19XXXA Unspecified fall, initial encounter: Secondary | ICD-10-CM

## 2021-07-12 DIAGNOSIS — S0181XA Laceration without foreign body of other part of head, initial encounter: Secondary | ICD-10-CM | POA: Diagnosis present

## 2021-07-12 DIAGNOSIS — K922 Gastrointestinal hemorrhage, unspecified: Secondary | ICD-10-CM | POA: Diagnosis present

## 2021-07-12 LAB — CBC WITH DIFFERENTIAL/PLATELET
Abs Immature Granulocytes: 0.1 10*3/uL — ABNORMAL HIGH (ref 0.00–0.07)
Basophils Absolute: 0 10*3/uL (ref 0.0–0.1)
Basophils Relative: 0 %
Eosinophils Absolute: 0 10*3/uL (ref 0.0–0.5)
Eosinophils Relative: 0 %
HCT: 49.1 % (ref 39.0–52.0)
Hemoglobin: 16.4 g/dL (ref 13.0–17.0)
Immature Granulocytes: 1 %
Lymphocytes Relative: 3 %
Lymphs Abs: 0.5 10*3/uL — ABNORMAL LOW (ref 0.7–4.0)
MCH: 32.3 pg (ref 26.0–34.0)
MCHC: 33.4 g/dL (ref 30.0–36.0)
MCV: 96.8 fL (ref 80.0–100.0)
Monocytes Absolute: 1 10*3/uL (ref 0.1–1.0)
Monocytes Relative: 5 %
Neutro Abs: 16.6 10*3/uL — ABNORMAL HIGH (ref 1.7–7.7)
Neutrophils Relative %: 91 %
Platelets: 184 10*3/uL (ref 150–400)
RBC: 5.07 MIL/uL (ref 4.22–5.81)
RDW: 13.4 % (ref 11.5–15.5)
WBC: 18.2 10*3/uL — ABNORMAL HIGH (ref 4.0–10.5)
nRBC: 0 % (ref 0.0–0.2)

## 2021-07-12 LAB — I-STAT VENOUS BLOOD GAS, ED
Acid-base deficit: 8 mmol/L — ABNORMAL HIGH (ref 0.0–2.0)
Bicarbonate: 15.7 mmol/L — ABNORMAL LOW (ref 20.0–28.0)
Calcium, Ion: 0.88 mmol/L — CL (ref 1.15–1.40)
HCT: 48 % (ref 39.0–52.0)
Hemoglobin: 16.3 g/dL (ref 13.0–17.0)
O2 Saturation: 93 %
Potassium: 4.3 mmol/L (ref 3.5–5.1)
Sodium: 136 mmol/L (ref 135–145)
TCO2: 17 mmol/L — ABNORMAL LOW (ref 22–32)
pCO2, Ven: 28.4 mmHg — ABNORMAL LOW (ref 44.0–60.0)
pH, Ven: 7.35 (ref 7.250–7.430)
pO2, Ven: 69 mmHg — ABNORMAL HIGH (ref 32.0–45.0)

## 2021-07-12 LAB — COMPREHENSIVE METABOLIC PANEL
ALT: 51 U/L — ABNORMAL HIGH (ref 0–44)
AST: 89 U/L — ABNORMAL HIGH (ref 15–41)
Albumin: 3 g/dL — ABNORMAL LOW (ref 3.5–5.0)
Alkaline Phosphatase: 82 U/L (ref 38–126)
Anion gap: 19 — ABNORMAL HIGH (ref 5–15)
BUN: 22 mg/dL (ref 8–23)
CO2: 14 mmol/L — ABNORMAL LOW (ref 22–32)
Calcium: 7.9 mg/dL — ABNORMAL LOW (ref 8.9–10.3)
Chloride: 104 mmol/L (ref 98–111)
Creatinine, Ser: 0.86 mg/dL (ref 0.61–1.24)
GFR, Estimated: >60 mL/min (ref >60)
Glucose, Bld: 78 mg/dL (ref 70–99)
Potassium: 4.4 mmol/L (ref 3.5–5.1)
Sodium: 137 mmol/L (ref 135–145)
Total Bilirubin: 1.6 mg/dL — ABNORMAL HIGH (ref 0.3–1.2)
Total Protein: 5.9 g/dL — ABNORMAL LOW (ref 6.5–8.1)

## 2021-07-12 LAB — I-STAT CHEM 8, ED
BUN: 25 mg/dL — ABNORMAL HIGH (ref 8–23)
Calcium, Ion: 0.84 mmol/L — CL (ref 1.15–1.40)
Chloride: 106 mmol/L (ref 98–111)
Creatinine, Ser: 0.8 mg/dL (ref 0.61–1.24)
Glucose, Bld: 77 mg/dL (ref 70–99)
HCT: 49 % (ref 39.0–52.0)
Hemoglobin: 16.7 g/dL (ref 13.0–17.0)
Potassium: 4.4 mmol/L (ref 3.5–5.1)
Sodium: 136 mmol/L (ref 135–145)
TCO2: 17 mmol/L — ABNORMAL LOW (ref 22–32)

## 2021-07-12 LAB — CK: Total CK: 1200 U/L — ABNORMAL HIGH (ref 49–397)

## 2021-07-12 LAB — CBG MONITORING, ED
Glucose-Capillary: 58 mg/dL — ABNORMAL LOW (ref 70–99)
Glucose-Capillary: 116 mg/dL — ABNORMAL HIGH (ref 70–99)
Glucose-Capillary: 64 mg/dL — ABNORMAL LOW (ref 70–99)

## 2021-07-12 LAB — RAPID URINE DRUG SCREEN, HOSP PERFORMED
Amphetamines: NOT DETECTED
Barbiturates: NOT DETECTED
Benzodiazepines: NOT DETECTED
Cocaine: NOT DETECTED
Opiates: NOT DETECTED
Tetrahydrocannabinol: NOT DETECTED

## 2021-07-12 LAB — AMMONIA: Ammonia: 48 umol/L — ABNORMAL HIGH (ref 9–35)

## 2021-07-12 LAB — PROTIME-INR
INR: 1.1 (ref 0.8–1.2)
Prothrombin Time: 14.7 seconds (ref 11.4–15.2)

## 2021-07-12 LAB — LACTIC ACID, PLASMA: Lactic Acid, Venous: 5.3 mmol/L (ref 0.5–1.9)

## 2021-07-12 LAB — RESP PANEL BY RT-PCR (FLU A&B, COVID) ARPGX2
Influenza A by PCR: NEGATIVE
Influenza B by PCR: NEGATIVE
SARS Coronavirus 2 by RT PCR: NEGATIVE

## 2021-07-12 LAB — MAGNESIUM: Magnesium: 1.9 mg/dL (ref 1.7–2.4)

## 2021-07-12 LAB — APTT: aPTT: 26 seconds (ref 24–36)

## 2021-07-12 MED ORDER — SODIUM CHLORIDE 0.9 % IV SOLN
2.0000 g | Freq: Once | INTRAVENOUS | Status: AC
  Administered 2021-07-12: 2 g via INTRAVENOUS
  Filled 2021-07-12: qty 2

## 2021-07-12 MED ORDER — FOLIC ACID 1 MG PO TABS
1.0000 mg | ORAL_TABLET | Freq: Every day | ORAL | Status: DC
  Administered 2021-07-12: 1 mg via ORAL
  Filled 2021-07-12 (×2): qty 1

## 2021-07-12 MED ORDER — THIAMINE HCL 100 MG/ML IJ SOLN
100.0000 mg | Freq: Every day | INTRAMUSCULAR | Status: DC
  Filled 2021-07-12 (×6): qty 2

## 2021-07-12 MED ORDER — METRONIDAZOLE 500 MG/100ML IV SOLN
500.0000 mg | Freq: Once | INTRAVENOUS | Status: AC
  Administered 2021-07-12: 500 mg via INTRAVENOUS
  Filled 2021-07-12: qty 100

## 2021-07-12 MED ORDER — VANCOMYCIN HCL IN DEXTROSE 1-5 GM/200ML-% IV SOLN
1000.0000 mg | Freq: Two times a day (BID) | INTRAVENOUS | Status: DC
  Filled 2021-07-12: qty 200

## 2021-07-12 MED ORDER — VANCOMYCIN HCL 1500 MG/300ML IV SOLN
1500.0000 mg | Freq: Once | INTRAVENOUS | Status: AC
  Administered 2021-07-12: 1500 mg via INTRAVENOUS
  Filled 2021-07-12: qty 300

## 2021-07-12 MED ORDER — LORAZEPAM 2 MG/ML IJ SOLN: 1.0000 mg | INTRAMUSCULAR | Status: AC | PRN

## 2021-07-12 MED ORDER — SODIUM CHLORIDE 0.9 % IV SOLN
2.0000 g | Freq: Three times a day (TID) | INTRAVENOUS | Status: DC
  Administered 2021-07-13: 2 g via INTRAVENOUS
  Filled 2021-07-12: qty 2

## 2021-07-12 MED ORDER — ADULT MULTIVITAMIN W/MINERALS CH
1.0000 | ORAL_TABLET | Freq: Every day | ORAL | Status: DC
  Administered 2021-07-12 – 2021-07-25 (×6): 1 via ORAL
  Filled 2021-07-12 (×14): qty 1

## 2021-07-12 MED ORDER — LACTATED RINGERS IV SOLN: INTRAVENOUS | Status: DC

## 2021-07-12 MED ORDER — THIAMINE HCL 100 MG PO TABS
100.0000 mg | ORAL_TABLET | Freq: Every day | ORAL | Status: DC
  Administered 2021-07-12 – 2021-07-25 (×5): 100 mg via ORAL
  Filled 2021-07-12 (×12): qty 1

## 2021-07-12 MED ORDER — LORAZEPAM 1 MG PO TABS
1.0000 mg | ORAL_TABLET | ORAL | Status: AC | PRN
  Administered 2021-07-12: 1 mg via ORAL
  Filled 2021-07-12: qty 1
  Filled 2021-07-12: qty 2

## 2021-07-12 MED ORDER — IOHEXOL 300 MG/ML  SOLN
75.0000 mL | Freq: Once | INTRAMUSCULAR | Status: AC | PRN
  Administered 2021-07-12: 75 mL via INTRAVENOUS

## 2021-07-12 MED ORDER — LIDOCAINE HCL (PF) 1 % IJ SOLN
5.0000 mL | Freq: Once | INTRAMUSCULAR | Status: AC
  Administered 2021-07-12: 5 mL via INTRADERMAL
  Filled 2021-07-12: qty 5

## 2021-07-12 MED ORDER — SODIUM CHLORIDE 0.9 % IV BOLUS
1000.0000 mL | Freq: Once | INTRAVENOUS | Status: AC
  Administered 2021-07-12: 1000 mL via INTRAVENOUS

## 2021-07-12 NOTE — ED Notes (Signed)
Patient transported to CT 

## 2021-07-12 NOTE — ED Notes (Signed)
CBG now 116 after orange juice

## 2021-07-12 NOTE — Progress Notes (Signed)
°   07/12/21 1915  OTHER  Substance Abuse Screening unable to be completed due to:  Patient Refused  Substance Abuse Education Offered Yes  (CAGE-AID) Substance Abuse Screening Tool  Have You Ever Felt You Ought to Cut Down on Your Drinking or Drug Use? 1  Have People Annoyed You By Critizing Your Drinking Or Drug Use? 0  Have You Felt Bad Or Guilty About Your Drinking Or Drug Use? 0  Have You Ever Had a Drink or Used Drugs First Thing In The Morning to Steady Your Nerves or to Get Rid of a Hangover? 0  CAGE-AID Score 1    CSW met with Pt at bedside. Per Pt his partner, Hassan Rowan, passed 2 months ago, then he was evicted from their rental home. Pt receives $1100/monthly disability. Was staying at [facility] until he ran out of money now staying outside.  Pt states that currently he drinks as much as he can, but denies drinking first thing in the morning. Pt reports that he needs to stop drinking, but declines and detox resources or 12-step resources saying that he will quit on his own.  CSW added homelessness resources to AVS.

## 2021-07-12 NOTE — Discharge Instructions (Signed)
Homeless Resources [place]   [facility] [phone] Call for shelter availability.  You must have access to a phone so they can call you back.  [facility] [address] [place] [phone] M-F 8:00-3:00; S-S 8:00-2:00  [facility] Shel[facility] (Men and Women) [address] [place] [phone]  [facility] (Men and Women) [address] [place] [phone]  [facility] ([facility]) [address] [place] [phone]  [facility] (Men) [address] [place] [phone]  [facility] (Single women and women with children) [address] [place] [phone]      

## 2021-07-12 NOTE — ED Triage Notes (Signed)
Pt BIB [facility] [address]. Fell yesterday mechanically bc he was drunk. L laceration on forehead. No LOC.   BP 134/78 HR 95-100  RR 18  CBG 96

## 2021-07-12 NOTE — ED Notes (Signed)
Patient transported to X-ray 

## 2021-07-12 NOTE — ED Provider Notes (Signed)
[facility] EMERGENCY DEPARTMENT Provider Note   CSN: [id number] Arrival date & time: 07/12/21  1610     History No chief complaint on file.   Dale Edwards is a 65 y.o. male.  The history is provided by the patient and medical records.  Trauma Mechanism of injury: Fall Injury location: head/neck and leg Injury location detail: head and R knee and L knee Incident location: unknown Time since incident: 1 day   Fall:      Fall occurred: in unknown circumstances  Protective equipment:       None      Suspicion of alcohol use: yes      Suspicion of drug use: no  EMS/PTA data:      Bystander interventions: none      Ambulatory at scene: no      Blood loss: minimal      Responsiveness: alert      Oriented to: person, place and time      Loss of consciousness: yes      Amnesic to event: yes      IV access: established      Fluids administered: normal saline      Immobilization: none      Airway condition since incident: stable      Breathing condition since incident: stable      Circulation condition since incident: stable  Current symptoms:      Pain scale: 4/10      Pain quality: aching      Pain timing: constant      Associated symptoms:            Reports loss of consciousness.            Denies abdominal pain, back pain, chest pain, seizures and vomiting.      Past Medical History:  Diagnosis Date   Back pain    COPD (chronic obstructive pulmonary disease) ([place])    Multiple sclerosis (HCC)    Tremors of nervous system    Right    Patient Active Problem List   Diagnosis Date Noted   Aspiration pneumonia ([place]) 07/12/2021   Generalized weakness 07/12/2021   Rhabdomyolysis 07/12/2021   Traumatic hematoma of head 07/12/2021   Abnormal finding on lung imaging 07/12/2021   Abnormal CT scan, neck 07/12/2021   Tobacco abuse 07/12/2021   Alcohol abuse 07/12/2021   Homeless 07/12/2021   COPD (chronic obstructive pulmonary disease)  ([place]) 07/12/2021   Prolonged QT interval 07/12/2021   Relapsing remitting multiple sclerosis ([place]) 10/12/2019   Tremor of right hand 10/12/2019   Gait abnormality 10/12/2019   Tremor 10/30/2017   Vitamin D deficiency 04/10/2017   Lumbar radiculopathy 11/15/2014   Low back pain 08/01/2014   Multiple sclerosis ([place]) 02/09/2013   Abnormality of gait 01/08/2013   Right sided weakness 01/08/2013   EMPHYSEMA 02/17/2009   DYSPNEA 02/17/2009   Nonspecific (abnormal) findings on radiological and other examination of body structure 02/17/2009   CT, CHEST, ABNORMAL 02/17/2009    Past Surgical History:  Procedure Laterality Date   ADENOIDECTOMY     broken leg     TONSILLECTOMY         Family History  Problem Relation Age of Onset   Heart Problems Mother     Social History   Tobacco Use   Smoking status: Every Day    Packs/day: 0.50    Types: Cigarettes   Smokeless tobacco: Never   Tobacco comments:    Half  Pack  Substance Use Topics   Alcohol use: Yes    Alcohol/week: 1.0 standard drink    Types: 1 Standard drinks or equivalent per week    Comment: quit 2 years ago..   pt is now drinking on occ - very rare maybe once a month.   Drug use: No    Home Medications Prior to Admission medications   Medication Sig Start Date End Date Taking? Authorizing Provider  propranolol (INDERAL) 40 MG tablet Take 1 tablet (40 mg total) by mouth 2 (two) times daily. Patient not taking: Reported on 07/10/2021 10/13/19   Marcial Pacas, MD  TYSABRI 300 MG/15ML injection INFUSE 300MG IV PER  PRESCRIBING INFORMATION  EVERY 4 WEEKS AS DIRECTED  (GIVEN AT MD OFFICE,  DISCARD UNUSED) Patient not taking: Reported on 07/10/2021 02/12/21   Marcial Pacas, MD    Allergies    Hydrocodone-acetaminophen  Review of Systems   Review of Systems  Constitutional:  Positive for chills and fatigue. Negative for fever.  HENT:  Negative for ear pain and sore throat.   Eyes:  Negative for pain and visual  disturbance.  Respiratory:  Positive for cough and shortness of breath.   Cardiovascular:  Negative for chest pain and palpitations.  Gastrointestinal:  Negative for abdominal pain and vomiting.  Genitourinary:  Negative for dysuria and hematuria.  Musculoskeletal:  Negative for arthralgias and back pain.  Skin:  Positive for wound. Negative for color change and rash.  Neurological:  Positive for loss of consciousness. Negative for seizures and syncope.  All other systems reviewed and are negative.  Physical Exam Updated Vital Signs BP 133/68    Pulse (!) 111    Temp 97.6 F (36.4 C) (Oral)    Resp (!) 23    SpO2 96%   Physical Exam Vitals and nursing note reviewed.  Constitutional:      General: He is not in acute distress.    Appearance: He is well-developed.     Comments: Appears disheveled and unkempt, smells of alcohol  HENT:     Head: Normocephalic.     Comments: Small punctate laceration to the left forehead.  Hemostatic.  No underlying crepitus.  No obvious foreign bodies.    Right Ear: External ear normal.     Left Ear: External ear normal.     Nose: Nose normal. No congestion.     Mouth/Throat:     Mouth: Mucous membranes are dry.     Pharynx: Oropharynx is clear. No posterior oropharyngeal erythema.  Eyes:     Extraocular Movements: Extraocular movements intact.     Conjunctiva/sclera: Conjunctivae normal.     Pupils: Pupils are equal, round, and reactive to light.  Cardiovascular:     Rate and Rhythm: Normal rate and regular rhythm.     Pulses: Normal pulses.     Heart sounds: No murmur heard. Pulmonary:     Effort: Pulmonary effort is normal. No respiratory distress.     Breath sounds: Rhonchi present. No wheezing or rales.  Abdominal:     General: Abdomen is flat. Bowel sounds are normal.     Palpations: Abdomen is soft.     Tenderness: There is no abdominal tenderness. There is no guarding or rebound.  Musculoskeletal:        General: Signs of injury  (Ecchymosis to bilateral knees with mild associated effusions.  No overlying wounds or abrasions.  Full range of motion.  Neurovascular intact distally.) present. No swelling, tenderness or deformity. Normal range  of motion.     Cervical back: Normal range of motion and neck supple. No rigidity.  Skin:    General: Skin is warm and dry.     Capillary Refill: Capillary refill takes less than 2 seconds.     Findings: No rash.  Neurological:     General: No focal deficit present.     Mental Status: He is alert and oriented to person, place, and time.  Psychiatric:        Mood and Affect: Mood normal.    ED Results / Procedures / Treatments   Labs (all labs ordered are listed, but only abnormal results are displayed) Labs Reviewed  COMPREHENSIVE METABOLIC PANEL - Abnormal; Notable for the following components:      Result Value   CO2 14 (*)    Calcium 7.9 (*)    Total Protein 5.9 (*)    Albumin 3.0 (*)    AST 89 (*)    ALT 51 (*)    Total Bilirubin 1.6 (*)    Anion gap 19 (*)    All other components within normal limits  CBC WITH DIFFERENTIAL/PLATELET - Abnormal; Notable for the following components:   WBC 18.2 (*)    Neutro Abs 16.6 (*)    Lymphs Abs 0.5 (*)    Abs Immature Granulocytes 0.10 (*)    All other components within normal limits  AMMONIA - Abnormal; Notable for the following components:   Ammonia 48 (*)    All other components within normal limits  LACTIC ACID, PLASMA - Abnormal; Notable for the following components:   Lactic Acid, Venous 5.3 (*)    All other components within normal limits  CK - Abnormal; Notable for the following components:   Total CK 1,200 (*)    All other components within normal limits  CBG MONITORING, ED - Abnormal; Notable for the following components:   Glucose-Capillary 58 (*)    All other components within normal limits  I-STAT CHEM 8, ED - Abnormal; Notable for the following components:   BUN 25 (*)    Calcium, Ion 0.84 (*)    TCO2  17 (*)    All other components within normal limits  I-STAT VENOUS BLOOD GAS, ED - Abnormal; Notable for the following components:   pCO2, Ven 28.4 (*)    pO2, Ven 69.0 (*)    Bicarbonate 15.7 (*)    TCO2 17 (*)    Acid-base deficit 8.0 (*)    Calcium, Ion 0.88 (*)    All other components within normal limits  CBG MONITORING, ED - Abnormal; Notable for the following components:   Glucose-Capillary 64 (*)    All other components within normal limits  CBG MONITORING, ED - Abnormal; Notable for the following components:   Glucose-Capillary 116 (*)    All other components within normal limits  RESP PANEL BY RT-PCR (FLU A&B, COVID) ARPGX2  CULTURE, BLOOD (SINGLE)  CULTURE, BLOOD (ROUTINE X 2)  CULTURE, BLOOD (ROUTINE X 2)  APTT  PROTIME-INR  MAGNESIUM  URINALYSIS, ROUTINE W REFLEX MICROSCOPIC  RAPID URINE DRUG SCREEN, HOSP PERFORMED  ETHANOL    EKG EKG Interpretation  Date/Time:  Thursday July 12 2021 19:01:34 EST Ventricular Rate:  105 PR Interval:  132 QRS Duration: 75 QT Interval:  407 QTC Calculation: 538 R Axis:   77 Text Interpretation: Sinus tachycardia Artifact Borderline repolarization abnormality Prolonged QT interval No significant change since last tracing Confirmed by Calvert Cantor [phone]) on 07/12/2021 7:47:10 PM  Radiology  DG Chest 1 View  Result Date: 07/12/2021 CLINICAL DATA:  Unwitnessed fall EXAM: CHEST  1 VIEW COMPARISON:  03/25/2014 FINDINGS: Chronic changes in the right mid and lower lung compatible with scarring. Interstitial prominence within the left lung. Blunting of the right costophrenic angle, likely scarring. No effusion on the left. Heart is upper limits normal in size. No acute bony abnormality. IMPRESSION: Chronic changes on the right. Interstitial prominence within the left lung. This could reflect chronic lung disease, less likely infection. Electronically Signed   By: Rolm Baptise M.D.   On: 07/12/2021 17:43   DG Pelvis 1-2  Views  Result Date: 07/12/2021 CLINICAL DATA:  Fall EXAM: PELVIS - 1-2 VIEW COMPARISON:  None. FINDINGS: Hip joints and SI joints are symmetric. No acute bony abnormality. Specifically, no fracture, subluxation, or dislocation. IMPRESSION: No acute bony abnormality. Electronically Signed   By: Rolm Baptise M.D.   On: 07/12/2021 17:44   DG Knee 2 Views Left  Result Date: 07/12/2021 CLINICAL DATA:  Fall EXAM: LEFT KNEE - 1-2 VIEW COMPARISON:  None. FINDINGS: No acute bony abnormality. Specifically, no fracture, subluxation, or dislocation. Joint spaces maintained. No joint effusion. IMPRESSION: No acute bony abnormality. Electronically Signed   By: Rolm Baptise M.D.   On: 07/12/2021 17:43   DG Knee 2 Views Right  Result Date: 07/12/2021 CLINICAL DATA:  Unwitnessed fall EXAM: RIGHT KNEE - 1-2 VIEW COMPARISON:  08/24/2014 FINDINGS: Old proximal right fibular fracture again seen, unchanged. No acute fracture, subluxation or dislocation. No joint effusion. Joint spaces maintained. IMPRESSION: No acute bony abnormality. Electronically Signed   By: Rolm Baptise M.D.   On: 07/12/2021 17:44   CT HEAD WO CONTRAST (5MM)  Result Date: 07/12/2021 CLINICAL DATA:  [place] yesterday. EXAM: CT HEAD WITHOUT CONTRAST CT CERVICAL SPINE WITHOUT CONTRAST TECHNIQUE: Multidetector CT imaging of the head and cervical spine was performed following the standard protocol without intravenous contrast. Multiplanar CT image reconstructions of the cervical spine were also generated. COMPARISON:  MRI brain dated May 08, 2019. MR cervical spine dated Nov 30, 2014. CT head dated August 06, 2012. FINDINGS: CT HEAD FINDINGS Brain: No evidence of acute infarction, hemorrhage, hydrocephalus, extra-axial collection or mass lesion/mass effect. Stable moderate atrophy and chronic microvascular ischemic changes. 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 Vascular: No hyperdense vessel or unexpected calcification. Skull:  Normal. Negative for fracture or focal lesion. Sinuses/Orbits: No acute finding. Other: Small frontal scalp hematomas, greater on the left. CT CERVICAL SPINE FINDINGS Alignment: Normal. Skull base and vertebrae: No acute fracture. Multiple scattered subcentimeter lucent lesions in the visualized cervical and upper thoracic vertebral bodies, the largest at C6 measuring 6 mm (series 11, image 31). Soft tissues and spinal canal: No prevertebral fluid or swelling. No visible canal hematoma. Disc levels: Mild multilevel disc height loss and endplate spurring from M0-L4 through C6-C7. Severe uncovertebral hypertrophy at C6-C7. Upper chest: Scarring and bullous emphysematous changes in the right upper lobe. Confluent ground-glass density in the left lung apex. Other: None. IMPRESSION: 1. No acute intracranial abnormality. Small frontal scalp hematomas, greater on the left. 2. No acute cervical spine fracture or traumatic listhesis. 3. Multiple scattered subcentimeter lucent lesions in the visualized cervical and upper thoracic vertebral bodies. These were not apparent on prior MRI cervical spine from 2016. Differential diagnosis includes multiple myeloma and metastatic disease. Appropriate oncologic workup is recommended. 4. Confluent ground-glass density in the left lung apex. This could represent pneumonia or aspiration. Consider further evaluation with chest CT.  Electronically Signed   By: Titus Dubin M.D.   On: 07/12/2021 18:23   CT CHEST W CONTRAST  Result Date: 07/12/2021 CLINICAL DATA:  Evaluate left apical ground-glass infiltrates on cervical spine CT today, which also demonstrated subcentimeter lucent lesions in the cervical and upper thoracic spine consistent with Mets or myeloma. EXAM: CT CHEST WITH CONTRAST TECHNIQUE: Multidetector CT imaging of the chest was performed during intravenous contrast administration. CONTRAST:  36m OMNIPAQUE IOHEXOL 300 MG/ML  SOLN COMPARISON:  Portable chest today, CTA  chest 03/26/2014. FINDINGS: Cardiovascular: Increased prominence of the pulmonary trunk indicating arterial hypertension. There is no ce2ntral embolus. There is homogeneous aortic and great vessel enhancement, with patchy calcification in the arch. There is no pericardial effusion. The cardiac size is normal . Lipomatous hypertrophy of the intra-atrial septum is again noted. There are scattered three-vessel coronary artery calcifications. Mediastinum/Nodes: Normal thyroid and thoracic esophagus. New finding of slightly prominent left hilar nodes up to 1.3 cm in short axis, with new left prevascular space and precarinal space nodes up to 1 cm in short axis and a single prominent subcarinal lymph node to the right of 1.2 cm short axis. No right hilar adenopathy, axillary or supraclavicular masses. Lungs/Pleura: Interval worsening bullous disease and increasing scar-like opacities in the right middle and lower lobe bases, increased bullous disease in the apical and anterior right upper lobe with scarring change, and chronic right lateral base pleuroparenchymal scarring again is noted with chronic posterior right pleural thickening. No pleural effusion. There are mild increased paraseptal emphysematous changes in the left upper lobe. There is interval development of interstitial and patchy ground-glass opacities, widespread within the left upper and lower lobes with relative apical and lower lobe superior segment sparing with similar opacities noted anteriorly in the right upper lobe and patchily in the basal segments of the right lower lobe, but less dense and less extensive. The trachea and central airways are free of filling defect. Upper Abdomen: No acute abnormality. Mild-to-moderate hepatic steatosis. Scattered calcified hepatic granulomas. Musculoskeletal: No chest wall abnormality. No acute or significant osseous findings. There is degenerative disc disease and spondylosis in the thoracic spine. There are few  tiny lucent lesions suspected in the head of the left clavicle and in the upper 2 thoracic vertebrae. No other focal bone lesion is seen. IMPRESSION: 1. Interstitial and patchy ground-glass opacities of left-greater-than-right lungs, favor pneumonic process including viral and atypical agents. Edema component less likely given lack of pleural effusions and venous distention. Alveolar proteinosis is also possible but rare. Follow-up study recommended to ensure clearing after treatment. 2. 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 Additional chronic pleural-parenchymal disease on the right. 3. Development of mildly prominent mediastinal and left hilar nodes, possibly reactive. No bulky or encasing adenopathy. 4. Aortic and coronary artery atherosclerosis. 5. Slightly prominent pulmonary trunk increased from previous exam. Lipomatous hypertrophy interatrial septum. 6. Fatty liver. 7. A few tiny lucent lesions suspected in the left clavicle head and in the upper 2 thoracic vertebrae. No destructive or expansile bone lesion. Electronically Signed   By: KTelford NabM.D.   On: 07/12/2021 21:45   CT CERVICAL SPINE WO CONTRAST  Result Date: 07/12/2021 CLINICAL DATA:  [place] EXAM: CT HEAD WITHOUT CONTRAST CT CERVICAL SPINE WITHOUT CONTRAST TECHNIQUE: Multidetector CT imaging of the head and cervical spine was performed following the standard protocol without intravenous contrast. Multiplanar CT image reconstructions of the cervical spine were also generated.  COMPARISON:  MRI brain dated May 08, 2019. MR cervical spine dated Nov 30, 2014. CT head dated August 06, 2012. FINDINGS: CT HEAD FINDINGS Brain: No evidence of acute infarction, hemorrhage, hydrocephalus, extra-axial collection or mass lesion/mass effect. Stable moderate atrophy and chronic microvascular ischemic changes. Chronic bilateral subcortical white matter infarcts  again noted. Vascular: No hyperdense vessel or unexpected calcification. Skull: Normal. Negative for fracture or focal lesion. Sinuses/Orbits: No acute finding. Other: Small frontal scalp hematomas, greater on the left. CT CERVICAL SPINE FINDINGS Alignment: Normal. Skull base and vertebrae: No acute fracture. Multiple scattered subcentimeter lucent lesions in the visualized cervical and upper thoracic vertebral bodies, the largest at C6 measuring 6 mm (series 11, image 31). Soft tissues and spinal canal: No prevertebral fluid or swelling. No visible canal hematoma. Disc levels: Mild multilevel disc height loss and endplate spurring from P3-I9 through C6-C7. Severe uncovertebral hypertrophy at C6-C7. Upper chest: Scarring and bullous emphysematous changes in the right upper lobe. Confluent ground-glass density in the left lung apex. Other: None. IMPRESSION: 1. No acute intracranial abnormality. Small frontal scalp hematomas, greater on the left. 2. No acute cervical spine fracture or traumatic listhesis. 3. Multiple scattered subcentimeter lucent lesions in the visualized cervical and upper thoracic vertebral bodies. These were not apparent on prior MRI cervical spine from 2016. Differential diagnosis includes multiple myeloma and metastatic disease. Appropriate oncologic workup is recommended. 4. Confluent ground-glass density in the left lung apex. This could represent pneumonia or aspiration. Consider further evaluation with chest CT. Electronically Signed   By: Titus Dubin M.D.   On: 07/12/2021 18:23    Procedures .Marland KitchenLaceration Repair  Date/Time: 07/12/2021 10:31 PM Performed by: Idamae Lusher, MD Authorized by: Truddie Hidden, MD   Consent:    Consent obtained:  Verbal   Consent given by:  Patient   Risks, benefits, and alternatives were discussed: yes     Risks discussed:  Pain, retained foreign body, need for additional repair, infection and poor cosmetic result   Alternatives  discussed:  No treatment and delayed treatment Universal protocol:    Patient identity confirmed:  Verbally with patient, hospital-assigned identification number and arm band Anesthesia:    Anesthesia method:  None Laceration details:    Location:  Face   Face location:  Forehead   Length (cm):  0.5 Pre-procedure details:    Preparation:  Patient was prepped and draped in usual sterile fashion and imaging obtained to evaluate for foreign bodies Exploration:    Limited defect created (wound extended): no     Imaging outcome: foreign body not noted     Wound exploration: wound explored through full range of motion and entire depth of wound visualized     Wound extent: no fascia violation noted, no nerve damage noted, no tendon damage noted and no underlying fracture noted     Contaminated: no   Treatment:    Area cleansed with:  Chlorhexidine   Amount of cleaning:  Standard   Irrigation solution:  Sterile water Skin repair:    Repair method:  Sutures   Suture size:  5-0   Suture material:  Fast-absorbing gut   Suture technique:  Simple interrupted   Number of sutures:  1 Approximation:    Approximation:  Close Repair type:    Repair type:  Simple Post-procedure details:    Dressing:  Open (no dressing)   Procedure completion:  Tolerated well, no immediate complications   Medications Ordered in ED Medications  LORazepam (ATIVAN) tablet 1-4  mg (has no administration in time range)    Or  LORazepam (ATIVAN) injection 1-4 mg (has no administration in time range)  thiamine tablet 100 mg (100 mg Oral Given 07/12/21 1855)    Or  thiamine (B-1) injection 100 mg ( Intravenous See Alternative 63/33/54 5625)  folic acid (FOLVITE) tablet 1 mg (1 mg Oral Given 07/12/21 1855)  multivitamin with minerals tablet 1 tablet (1 tablet Oral Given 07/12/21 1856)  lactated ringers infusion ( Intravenous New Bag/Given 07/12/21 1900)  ceFEPIme (MAXIPIME) 2 g in sodium chloride 0.9 % 100 mL IVPB  (has no administration in time range)  vancomycin (VANCOCIN) IVPB 1000 mg/200 mL premix (has no administration in time range)  sodium chloride 0.9 % bolus 1,000 mL (1,000 mLs Intravenous New Bag/Given 07/12/21 1858)  ceFEPIme (MAXIPIME) 2 g in sodium chloride 0.9 % 100 mL IVPB (0 g Intravenous Stopped 07/12/21 1954)  metroNIDAZOLE (FLAGYL) IVPB 500 mg (0 mg Intravenous Stopped 07/12/21 2030)  vancomycin (VANCOREADY) IVPB 1500 mg/300 mL (1,500 mg Intravenous New Bag/Given 07/12/21 2022)  lidocaine (PF) (XYLOCAINE) 1 % injection 5 mL (5 mLs Intradermal Given 07/12/21 2037)  iohexol (OMNIPAQUE) 300 MG/ML solution 75 mL (75 mLs Intravenous Contrast Given 07/12/21 2047)    ED Course  I have reviewed the triage vital signs and the nursing notes.  Pertinent labs & imaging results that were available during my care of the patient were reviewed by me and considered in my medical decision making (see chart for details).    MDM Rules/Calculators/A&P                          65 year old male with a history of homelessness, MS, and alcohol abuse who presents for evaluation of an unwitnessed mechanical fall.  He was found down outside.  He admits to alcohol use.  He is hypothermic here but hemodynamic stable. Mildly tachycardic and hypoxic requiring 3 L of O2.  Chest x-ray obtained and concerning for aspiration pneumonia with increased opacities in the right lower lobe. EKG showed sinus tachycardia with questionable QTC prolongation of 566. Patient is tremulous.  He was placed on CIWA protocol due to concern for alcohol withdrawal. Patient given IV fluids. Focus trauma imaging obtained and showed no acute injuries involving the head or C-spine.  Bilateral knee x-rays negative.  Chest x-ray showed no obvious pneumothorax or rib fractures.  Labs reviewed.  Lactic acid is elevated to 5.3.  White count elevated to 18.  Sepsis protocol initiated.  Likely source pneumonia as above.  Blood cultures pending.  Patient  started on empiric antibiotics.  COVID and flu negative.  VBG showed normal pH and PCO2.  Ionized calcium low, but likely related to hypoalbuminemia.  CK is elevated, but creatinine normal.  Low concern for rhabdomyolysis.  Medicine team contacted for admission.  Handoff given.    Final Clinical Impression(s) / ED Diagnoses Final diagnoses:  Lactic acidosis  Aspiration pneumonia of right lower lobe, unspecified aspiration pneumonia type [facility])    Rx / DC Orders ED Discharge Orders     None        Idamae Lusher, MD 07/12/21 2235    Truddie Hidden, MD 07/12/21 2257

## 2021-07-12 NOTE — ED Notes (Signed)
RN noted pts soiled linens. Pt linens changed, pt dried and given mesh underwear.

## 2021-07-12 NOTE — H&P (Signed)
History and Physical    Ramin Zoll BXU:[id number] DOB: 1956/03/13 DOA: 07/12/2021  PCP: Elwyn Reach, MD   Patient coming from: [place]  Chief Complaint: Fall and has head injury  HPI: Dale Edwards is a 65 y.o. male with medical history significant for alcohol abuse, tobacco abuse, homeless who presents by EMS after being found laying on ground. Has cut on left forehead with hematoma and bleeding.  Patient is unsure of what made him fall or how long he was laying on the ground.  Reports he is homeless and does not member the events that led to him being found on the ground.  He does report drinking alcohol when he can.  His drink of choice is whiskey.  He last drank last night.  He smokes a pack of cigarettes a day.  He denies any slurred speech, drooping face or numbness of extremities.  He does report chronic low back pain and reports headache and bilateral knee pain since his fall.  He is not sure if he lost consciousness.  He is unsure if he had a seizure activity but states he has no history of seizures and he does not remember vomiting.  He reports a history of multiple sclerosis but states he is not on any treatment.  Reports he has been homeless since his significant other passed away 10 months ago and his family will have a day to do with him he states.  Dates he has not had any fevers or chills.  He has had a mild cough today.  ED Course: Patient had a trauma imaging in the emergency room with no fractures of his knees or pelvis.  He had CT of the head and neck which showed no acute pathology.  He does have multiple lucencies in cervical spine vertebral bodies that were not present a few years ago according to radiology and are suspicious for metastatic disease versus multiple myeloma.  He also has a density in the left upper lung that radiology recommends CT of the chest to further evaluate.  Patient hemodynamically stable in the emergency room.  VBG shows pH 7.35 PCO2 20.4 PO2  69 bicarb 17.  Lab work shows sodium 136 potassium 4.3 chloride 106 bicarb 14 creatinine 0.80 BUN 25 glucose 77 alk phos is 82 AST is 89 ALT 51 ammonia 48 albumin 3.0 magnesium 1.9, CK12 100 lactic acid 5.3 WBC 18,200 hemoglobin 16.4 hematocrit 49.1 platelets 184,000 INR 1.1.  COVID and flu screening is pending.  Hospitalist service asked admit patient for further management  Review of Systems:  General: Reports weakness and falls. Denies fever, chills, weight loss, night sweats. Denies dizziness.  Denies change in appetite HENT: Denies change in hearing, tinnitus.  Denies nasal congestion or bleeding.  Denies sore throat. Denies difficulty swallowing Eyes: Denies blurry vision, pain in eye, drainage.  Denies discoloration of eyes. Neck: Denies pain.  Denies swelling.  Denies pain with movement. Cardiovascular: Denies chest pain, palpitations.  Denies edema.  Denies orthopnea Respiratory: Reports shortness of breath with exertion. Has cough.  Denies wheezing.  Denies sputum production Gastrointestinal: Reports abdominal pain, swelling.  Denies nausea, vomiting, diarrhea.  Denies melena.  Denies hematemesis. Musculoskeletal: Denies limitation of movement.  Denies deformity or swelling. Reports chronic back pain. Has knee pain Genitourinary: Denies pelvic pain.  Denies urinary frequency or hesitancy.  Denies dysuria.  Skin: Denies rash.  Denies petechiae, purpura, ecchymosis. Neurological: Denies syncope.  Denies seizure activity. Denies paresthesia. Denies slurred speech, drooping face.  Denies  visual change. Psychiatric: Denies depression, anxiety. Denies hallucinations.  Past Medical History:  Diagnosis Date   Back pain    COPD (chronic obstructive pulmonary disease) (HCC)    Multiple sclerosis (HCC)    Tremors of nervous system    Right    Past Surgical History:  Procedure Laterality Date   ADENOIDECTOMY     broken leg     TONSILLECTOMY      Social History  reports that he has been  smoking cigarettes. He has been smoking an average of .5 packs per day. He has never used smokeless tobacco. He reports current alcohol use of about 1.0 standard drink per week. He reports that he does not use drugs.  Allergies  Allergen Reactions   Hydrocodone-Acetaminophen Itching and Nausea Only         Family History  Problem Relation Age of Onset   Heart Problems Mother      Prior to Admission medications   Medication Sig Start Date End Date Taking? Authorizing Provider  propranolol (INDERAL) 40 MG tablet Take 1 tablet (40 mg total) by mouth 2 (two) times daily. Patient not taking: Reported on 07/10/2021 10/13/19   Marcial Pacas, MD  TYSABRI 300 MG/15ML injection INFUSE 300MG IV PER  PRESCRIBING INFORMATION  EVERY 4 WEEKS AS DIRECTED  (GIVEN AT MD OFFICE,  DISCARD UNUSED) Patient not taking: Reported on 07/10/2021 02/12/21   Marcial Pacas, MD    Physical Exam: Vitals:   07/12/21 1632 07/12/21 1812 07/12/21 1845 07/12/21 1945  BP:   129/73 133/68  Pulse:   99 (!) 111  Resp:   (!) 25 (!) 23  Temp:  97.6 F (36.4 C)    TempSrc:  Oral    SpO2: 92%  95% 96%    Constitutional: NAD, calm, comfortable Vitals:   07/12/21 1632 07/12/21 1812 07/12/21 1845 07/12/21 1945  BP:   129/73 133/68  Pulse:   99 (!) 111  Resp:   (!) 25 (!) 23  Temp:  97.6 F (36.4 C)    TempSrc:  Oral    SpO2: 92%  95% 96%   General: WD thin male, Alert and oriented x3.  Eyes: EOMI, PERRL, conjunctivae normal.  Sclera nonicteric. No nystagmus HENT:  [place], Abrasion on right forehead. Laceration with hematoma of left forehead. external ears normal.  Nares patent without epistasis.  Mucous membranes are dry. Poor dentition.  Neck: Soft, normal range of motion, supple, no masses,  Trachea midline Respiratory: Equal breath sounds that are diminished bilaterally with diffuse scattered Rales.  Mild expiratory wheezing, no crackles. Normal respiratory effort. No accessory muscle use.  Cardiovascular: Regular rate and  rhythm, no murmurs / rubs / gallops. No extremity edema. 2+ pedal pulses Abdomen: Soft, Mild diffuse tenderness, nondistended, no rebound or guarding.  No masses palpated. Bowel sounds normoactive Musculoskeletal: FROM. Has clubbing of digits. No cyanosis. No joint deformity upper and lower extremities. Normal muscle tone.  Skin: Warm, dry, intact no rashes, lesions, ulcers. No induration Neurologic: CN 2-12 grossly intact.  Normal speech.  Sensation intact, patella DTR +1 bilaterally. Strength 3/5 in all extremities. Tremors of both arms Psychiatric: Normal judgment and insight.  Normal mood.    Labs on Admission: I have personally reviewed following labs and imaging studies  CBC: Recent Labs  Lab 07/09/21 2242 07/12/21 1648 07/12/21 1725  WBC 13.5* 18.2*  --   NEUTROABS 11.0* 16.6*  --   HGB 17.5* 16.4 16.3   16.7  HCT 52.5* 49.1 48.0  49.0  MCV 94.8 96.8  --   PLT 244 184  --     Basic Metabolic Panel: Recent Labs  Lab 07/09/21 2242 07/12/21 1648 07/12/21 1725  NA 136 137 136   136  K 4.3 4.4 4.3   4.4  CL 101 104 106  CO2 27 14*  --   GLUCOSE 94 78 77  BUN 19 22 25*  CREATININE 1.07 0.86 0.80  CALCIUM 9.0 7.9*  --   MG  --  1.9  --     GFR: CrCl cannot be calculated (Unknown ideal weight.).  Liver Function Tests: Recent Labs  Lab 07/09/21 2242 07/12/21 1648  AST 38 89*  ALT 25 51*  ALKPHOS 92 82  BILITOT 3.5* 1.6*  PROT 6.6 5.9*  ALBUMIN 4.0 3.0*    Urine analysis:    Component Value Date/Time   COLORURINE YELLOW 12/17/2011 1743   APPEARANCEUR CLEAR 12/17/2011 1743   LABSPEC 1.000 (L) 12/17/2011 1743   PHURINE 7.0 12/17/2011 1743   GLUCOSEU NEGATIVE 12/17/2011 1743   HGBUR NEGATIVE 12/17/2011 1743   BILIRUBINUR NEGATIVE 12/17/2011 1743   KETONESUR NEGATIVE 12/17/2011 1743   PROTEINUR NEGATIVE 12/17/2011 1743   UROBILINOGEN 0.2 12/17/2011 1743   NITRITE NEGATIVE 12/17/2011 1743   LEUKOCYTESUR NEGATIVE 12/17/2011 1743    Radiological Exams  on Admission: DG Chest 1 View  Result Date: 07/12/2021 CLINICAL DATA:  Unwitnessed fall EXAM: CHEST  1 VIEW COMPARISON:  03/25/2014 FINDINGS: Chronic changes in the right mid and lower lung compatible with scarring. Interstitial prominence within the left lung. Blunting of the right costophrenic angle, likely scarring. No effusion on the left. Heart is upper limits normal in size. No acute bony abnormality. IMPRESSION: Chronic changes on the right. Interstitial prominence within the left lung. This could reflect chronic lung disease, less likely infection. Electronically Signed   By: Rolm Baptise M.D.   On: 07/12/2021 17:43   DG Pelvis 1-2 Views  Result Date: 07/12/2021 CLINICAL DATA:  Fall EXAM: PELVIS - 1-2 VIEW COMPARISON:  None. FINDINGS: Hip joints and SI joints are symmetric. No acute bony abnormality. Specifically, no fracture, subluxation, or dislocation. IMPRESSION: No acute bony abnormality. Electronically Signed   By: Rolm Baptise M.D.   On: 07/12/2021 17:44   DG Knee 2 Views Left  Result Date: 07/12/2021 CLINICAL DATA:  Fall EXAM: LEFT KNEE - 1-2 VIEW COMPARISON:  None. FINDINGS: No acute bony abnormality. Specifically, no fracture, subluxation, or dislocation. Joint spaces maintained. No joint effusion. IMPRESSION: No acute bony abnormality. Electronically Signed   By: Rolm Baptise M.D.   On: 07/12/2021 17:43   DG Knee 2 Views Right  Result Date: 07/12/2021 CLINICAL DATA:  Unwitnessed fall EXAM: RIGHT KNEE - 1-2 VIEW COMPARISON:  08/24/2014 FINDINGS: Old proximal right fibular fracture again seen, unchanged. No acute fracture, subluxation or dislocation. No joint effusion. Joint spaces maintained. IMPRESSION: No acute bony abnormality. Electronically Signed   By: Rolm Baptise M.D.   On: 07/12/2021 17:44   CT HEAD WO CONTRAST (5MM)  Result Date: 07/12/2021 CLINICAL DATA:  [place] yesterday. EXAM: CT HEAD WITHOUT CONTRAST CT CERVICAL SPINE WITHOUT CONTRAST TECHNIQUE: Multidetector CT  imaging of the head and cervical spine was performed following the standard protocol without intravenous contrast. Multiplanar CT image reconstructions of the cervical spine were also generated. COMPARISON:  MRI brain dated May 08, 2019. MR cervical spine dated Nov 30, 2014. CT head dated August 06, 2012. FINDINGS: CT HEAD FINDINGS Brain: No evidence of acute infarction,  hemorrhage, hydrocephalus, extra-axial collection or mass lesion/mass effect. Stable moderate atrophy and chronic microvascular ischemic changes. 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 Vascular: No hyperdense vessel or unexpected calcification. Skull: Normal. Negative for fracture or focal lesion. Sinuses/Orbits: No acute finding. Other: Small frontal scalp hematomas, greater on the left. CT CERVICAL SPINE FINDINGS Alignment: Normal. Skull base and vertebrae: No acute fracture. Multiple scattered subcentimeter lucent lesions in the visualized cervical and upper thoracic vertebral bodies, the largest at C6 measuring 6 mm (series 11, image 31). Soft tissues and spinal canal: No prevertebral fluid or swelling. No visible canal hematoma. Disc levels: Mild multilevel disc height loss and endplate spurring from P5-T6 through C6-C7. Severe uncovertebral hypertrophy at C6-C7. Upper chest: Scarring and bullous emphysematous changes in the right upper lobe. Confluent ground-glass density in the left lung apex. Other: None. IMPRESSION: 1. No acute intracranial abnormality. Small frontal scalp hematomas, greater on the left. 2. No acute cervical spine fracture or traumatic listhesis. 3. Multiple scattered subcentimeter lucent lesions in the visualized cervical and upper thoracic vertebral bodies. These were not apparent on prior MRI cervical spine from 2016. Differential diagnosis includes multiple myeloma and metastatic disease. Appropriate oncologic workup is recommended. 4. Confluent ground-glass density in the left lung apex.  This could represent pneumonia or aspiration. Consider further evaluation with chest CT. Electronically Signed   By: Titus Dubin M.D.   On: 07/12/2021 18:23   CT CERVICAL SPINE WO CONTRAST  Result Date: 07/12/2021 CLINICAL DATA:  [place] yesterday. EXAM: CT HEAD WITHOUT CONTRAST CT CERVICAL SPINE WITHOUT CONTRAST TECHNIQUE: Multidetector CT imaging of the head and cervical spine was performed following the standard protocol without intravenous contrast. Multiplanar CT image reconstructions of the cervical spine were also generated. COMPARISON:  MRI brain dated May 08, 2019. MR cervical spine dated Nov 30, 2014. CT head dated August 06, 2012. FINDINGS: CT HEAD FINDINGS Brain: No evidence of acute infarction, hemorrhage, hydrocephalus, extra-axial collection or mass lesion/mass effect. Stable moderate atrophy and chronic microvascular ischemic changes. 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 Vascular: No hyperdense vessel or unexpected calcification. Skull: Normal. Negative for fracture or focal lesion. Sinuses/Orbits: No acute finding. Other: Small frontal scalp hematomas, greater on the left. CT CERVICAL SPINE FINDINGS Alignment: Normal. Skull base and vertebrae: No acute fracture. Multiple scattered subcentimeter lucent lesions in the visualized cervical and upper thoracic vertebral bodies, the largest at C6 measuring 6 mm (series 11, image 31). Soft tissues and spinal canal: No prevertebral fluid or swelling. No visible canal hematoma. Disc levels: Mild multilevel disc height loss and endplate spurring from R4-E3 through C6-C7. Severe uncovertebral hypertrophy at C6-C7. Upper chest: Scarring and bullous emphysematous changes in the right upper lobe. Confluent ground-glass density in the left lung apex. Other: None. IMPRESSION: 1. No acute intracranial abnormality. Small frontal scalp hematomas, greater on the left. 2. No acute cervical spine fracture or traumatic listhesis. 3.  Multiple scattered subcentimeter lucent lesions in the visualized cervical and upper thoracic vertebral bodies. These were not apparent on prior MRI cervical spine from 2016. Differential diagnosis includes multiple myeloma and metastatic disease. Appropriate oncologic workup is recommended. 4. Confluent ground-glass density in the left lung apex. This could represent pneumonia or aspiration. Consider further evaluation with chest CT. Electronically Signed   By: Titus Dubin M.D.   On: 07/12/2021 18:23    EKG: Independently reviewed.  EKG shows sinus tachycardia with no acute ST elevation or depression.  QTc prolonged at 566  Assessment/Plan Principal Problem:  Aspiration pneumonia  Mr. Illescas is admitted to Medical telemetry floor.  Started on Unasyn to cover for aspiration pneumonia.  Incentive spirometer every 2 hours while awake IVF hydration with LR  Active Problems:   Prolonged QT interval Avoid medications which could further prolong QT interval.  Monitor on telemetry.    Rhabdomyolysis IVF hydration with LR at 100 ml/hr overnight. Recheck CK level in am Check CMP in am    COPD (chronic obstructive pulmonary disease)  Duonebs as needed. Incentive spirometer every 2 hours while awake.    Generalized weakness Has weakness and states he has had multiple falls.  Last night was the worst fall he sustained he is not sure how long he was on the ground. Physical therapy to evaluate in the morning    Abnormal finding on lung imaging Recall CT scan showed a density in the left upper lobe.  CT of the chest was recommend by radiology and has been ordered    Alcohol abuse On CIWA protocol to prevent withdrawal.  Ativan provided as needed.    Abnormal CT scan, neck Pt with multiple lucencies in cervical vertebrae which could be multiple myeloma vs metastatic disease Check multiple myeloma labs Check PSA May need oncology consult    Traumatic hematoma of head Secondary to fall.  Has small laceration of left scalp that will be closed by ER PA    Tobacco abuse Nicotine patch provided to prevent withdrawal from nicotine    Homeless Social work/Discharge planner to assist with discharge disposition   DVT prophylaxis: Lovenox for DVT prophyalxis.  Code Status:   Full Code  Family Communication:  Diagnosis and plan discussed with patient.  He verbalized understanding agrees with plan.  Further recommendations to follow as clinical indicated Disposition Plan:   Patient is from:  From [place], pt is homeless  Anticipated DC to:  To be determined  Anticipated DC date:  Anticipate 2 midnight or more stay in hospital  Admission status:  Inpatient   Yevonne Aline Toshia Larkin MD [facility]  How to contact the [facility] Attending or Consulting provider [place] or covering provider during after hours [place], for this patient?   Check the care team in [facility] and look for a) attending/consulting [facility] provider listed and b) the [facility] team listed Log into [email or web address] and use [facility]'s universal password to access. If you do not have the password, please contact the hospital operator. Locate the [facility] provider you are looking for under [facility] and page to a number that you can be directly reached. If you still have difficulty reaching the provider, please page the [facility] (Director on Call) for the Hospitalists listed on amion for assistance.  07/12/2021, 8:36 PM

## 2021-07-12 NOTE — ED Notes (Signed)
Pt refused rectal temp. MD made aware.

## 2021-07-12 NOTE — Progress Notes (Signed)
Pharmacy Antibiotic Note  Dale Edwards is a 65 y.o. male admitted on 07/12/2021 with sepsis.  Pharmacy has been consulted for vancomycin and cefepime dosing.  Patient with a history of homelessness, MS, and alcohol abuse . Patient presenting following an unwitnessed mechanical fall.  SCr 0.8 - at baseline WBC 18.2; LA 5.3; T 97.6 F  Plan: Metronidazole per MD Cefepime 2g q8hr Vancomycin 1500 mg once then 1000 mg q12hr (eAUC 508.2) unless change in renal function Trend WBC, Fever, Renal function, & Clinical course F/u cultures, clinical course, WBC, fever De-escalate when able Levels at steady state     Temp (24hrs), Avg:97.3 F (36.3 C), Min:97 F (36.1 C), Max:97.6 F (36.4 C)  Recent Labs  Lab 07/09/21 2242 07/12/21 1648 07/12/21 1725  WBC 13.5* 18.2*  --   CREATININE 1.07 0.86 0.80  LATICACIDVEN  --  5.3*  --     CrCl cannot be calculated (Unknown ideal weight.).    Allergies  Allergen Reactions   Hydrocodone-Acetaminophen Itching and Nausea Only         Antimicrobials this admission: Flagyl x 1 in ED 12/15 cefepime 12/15 >>  vancomycin 12/15 >>   Microbiology results: Pending  Thank you for allowing pharmacy to be a part of this patients care.  Delmar Landau, PharmD, BCPS 07/12/2021 6:42 PM ED Clinical Pharmacist -  [phone]

## 2021-07-12 NOTE — ED Notes (Signed)
Was only able to obtain 1 set of blood cultures. MD made aware.  

## 2021-07-12 NOTE — ED Notes (Signed)
Lactic 5.3. MD made aware

## 2021-07-12 NOTE — ED Notes (Addendum)
Pt CBG 58, pt is alert and not showing any symptoms. RN gave pt 6 oz orange juice and rechecked blood sugar 15  minutes later, pt CBG 64, RN gave pt 4 oz orange juice

## 2021-07-13 ENCOUNTER — Other Ambulatory Visit: Payer: Self-pay

## 2021-07-13 DIAGNOSIS — E872 Acidosis, unspecified: Secondary | ICD-10-CM

## 2021-07-13 DIAGNOSIS — R9431 Abnormal electrocardiogram [ECG] [EKG]: Secondary | ICD-10-CM

## 2021-07-13 DIAGNOSIS — M6282 Rhabdomyolysis: Secondary | ICD-10-CM

## 2021-07-13 LAB — LACTIC ACID, PLASMA: Lactic Acid, Venous: 1.3 mmol/L (ref 0.5–1.9)

## 2021-07-13 LAB — URINALYSIS, MICROSCOPIC (REFLEX): Bacteria, UA: NONE SEEN

## 2021-07-13 LAB — URINALYSIS, ROUTINE W REFLEX MICROSCOPIC
Bilirubin Urine: NEGATIVE
Glucose, UA: NEGATIVE mg/dL
Ketones, ur: >80 mg/dL — AB
Leukocytes,Ua: NEGATIVE
Nitrite: NEGATIVE
Protein, ur: NEGATIVE mg/dL
Specific Gravity, Urine: 1.02 (ref 1.005–1.030)
pH: 5 (ref 5.0–8.0)

## 2021-07-13 LAB — COMPREHENSIVE METABOLIC PANEL
ALT: 47 U/L — ABNORMAL HIGH (ref 0–44)
AST: 73 U/L — ABNORMAL HIGH (ref 15–41)
Albumin: 2.6 g/dL — ABNORMAL LOW (ref 3.5–5.0)
Alkaline Phosphatase: 65 U/L (ref 38–126)
Anion gap: 8 (ref 5–15)
BUN: 18 mg/dL (ref 8–23)
CO2: 21 mmol/L — ABNORMAL LOW (ref 22–32)
Calcium: 8.1 mg/dL — ABNORMAL LOW (ref 8.9–10.3)
Chloride: 105 mmol/L (ref 98–111)
Creatinine, Ser: 1.08 mg/dL (ref 0.61–1.24)
GFR, Estimated: >60 mL/min (ref >60)
Glucose, Bld: 123 mg/dL — ABNORMAL HIGH (ref 70–99)
Potassium: 4 mmol/L (ref 3.5–5.1)
Sodium: 134 mmol/L — ABNORMAL LOW (ref 135–145)
Total Bilirubin: 1.8 mg/dL — ABNORMAL HIGH (ref 0.3–1.2)
Total Protein: 5.2 g/dL — ABNORMAL LOW (ref 6.5–8.1)

## 2021-07-13 LAB — CBC
HCT: 42.5 % (ref 39.0–52.0)
Hemoglobin: 14.7 g/dL (ref 13.0–17.0)
MCH: 32 pg (ref 26.0–34.0)
MCHC: 34.6 g/dL (ref 30.0–36.0)
MCV: 92.4 fL (ref 80.0–100.0)
Platelets: 169 10*3/uL (ref 150–400)
RBC: 4.6 MIL/uL (ref 4.22–5.81)
RDW: 13.3 % (ref 11.5–15.5)
WBC: 11.1 10*3/uL — ABNORMAL HIGH (ref 4.0–10.5)
nRBC: 0 % (ref 0.0–0.2)

## 2021-07-13 LAB — MAGNESIUM: Magnesium: 1.7 mg/dL (ref 1.7–2.4)

## 2021-07-13 LAB — PHOSPHORUS: Phosphorus: 2 mg/dL — ABNORMAL LOW (ref 2.5–4.6)

## 2021-07-13 LAB — HIV ANTIBODY (ROUTINE TESTING W REFLEX): HIV Screen 4th Generation wRfx: NONREACTIVE

## 2021-07-13 MED ORDER — ENOXAPARIN SODIUM 40 MG/0.4ML IJ SOSY
40.0000 mg | PREFILLED_SYRINGE | INTRAMUSCULAR | Status: DC
  Administered 2021-07-14 – 2021-07-27 (×14): 40 mg via SUBCUTANEOUS
  Filled 2021-07-13 (×14): qty 0.4

## 2021-07-13 MED ORDER — ADULT MULTIVITAMIN W/MINERALS CH
1.0000 | ORAL_TABLET | Freq: Every day | ORAL | Status: DC
  Administered 2021-07-13 – 2021-07-27 (×13): 1 via ORAL
  Filled 2021-07-13 (×7): qty 1

## 2021-07-13 MED ORDER — ACETAMINOPHEN 325 MG PO TABS
650.0000 mg | ORAL_TABLET | Freq: Four times a day (QID) | ORAL | Status: DC | PRN
  Administered 2021-07-14 – 2021-07-27 (×15): 650 mg via ORAL
  Filled 2021-07-13 (×15): qty 2

## 2021-07-13 MED ORDER — IPRATROPIUM-ALBUTEROL 0.5-2.5 (3) MG/3ML IN SOLN: 3.0000 mL | Freq: Four times a day (QID) | RESPIRATORY_TRACT | Status: DC | PRN

## 2021-07-13 MED ORDER — LORAZEPAM 2 MG/ML IJ SOLN: 1.0000 mg | INTRAMUSCULAR | Status: AC | PRN

## 2021-07-13 MED ORDER — FOLIC ACID 1 MG PO TABS
1.0000 mg | ORAL_TABLET | Freq: Every day | ORAL | Status: DC
  Administered 2021-07-13 – 2021-07-27 (×15): 1 mg via ORAL
  Filled 2021-07-13 (×14): qty 1

## 2021-07-13 MED ORDER — SODIUM CHLORIDE 0.9 % IV SOLN
1.5000 g | Freq: Four times a day (QID) | INTRAVENOUS | Status: DC
  Administered 2021-07-13 – 2021-07-15 (×7): 1.5 g via INTRAVENOUS
  Filled 2021-07-13 (×10): qty 4

## 2021-07-13 MED ORDER — NICOTINE 14 MG/24HR TD PT24
14.0000 mg | MEDICATED_PATCH | Freq: Every day | TRANSDERMAL | Status: DC
  Administered 2021-07-14 – 2021-07-23 (×10): 14 mg via TRANSDERMAL
  Filled 2021-07-13 (×10): qty 1

## 2021-07-13 MED ORDER — SENNOSIDES-DOCUSATE SODIUM 8.6-50 MG PO TABS
1.0000 | ORAL_TABLET | Freq: Every evening | ORAL | Status: DC | PRN
  Filled 2021-07-13: qty 1

## 2021-07-13 MED ORDER — THIAMINE HCL 100 MG/ML IJ SOLN
100.0000 mg | Freq: Every day | INTRAMUSCULAR | Status: DC
  Administered 2021-07-25: 08:00:00 100 mg via INTRAVENOUS
  Filled 2021-07-13 (×5): qty 2

## 2021-07-13 MED ORDER — LORAZEPAM 1 MG PO TABS
1.0000 mg | ORAL_TABLET | ORAL | Status: AC | PRN
  Administered 2021-07-15 – 2021-07-16 (×3): 2 mg via ORAL
  Filled 2021-07-13 (×2): qty 2

## 2021-07-13 MED ORDER — SODIUM CHLORIDE 0.9 % IV SOLN
3.0000 g | Freq: Four times a day (QID) | INTRAVENOUS | Status: DC
  Filled 2021-07-13 (×2): qty 8

## 2021-07-13 MED ORDER — LACTATED RINGERS IV SOLN: INTRAVENOUS | Status: DC

## 2021-07-13 MED ORDER — SODIUM CHLORIDE 0.9 % IV SOLN: INTRAVENOUS | Status: AC

## 2021-07-13 MED ORDER — SODIUM CHLORIDE 0.9 % IV SOLN
1.5000 g | Freq: Four times a day (QID) | INTRAVENOUS | Status: DC
  Filled 2021-07-13 (×3): qty 4

## 2021-07-13 MED ORDER — ACETAMINOPHEN 650 MG RE SUPP
650.0000 mg | Freq: Four times a day (QID) | RECTAL | Status: DC | PRN
  Filled 2021-07-13 (×2): qty 1

## 2021-07-13 MED ORDER — THIAMINE HCL 100 MG PO TABS
100.0000 mg | ORAL_TABLET | Freq: Every day | ORAL | Status: DC
  Administered 2021-07-13 – 2021-07-27 (×13): 100 mg via ORAL
  Filled 2021-07-13 (×10): qty 1

## 2021-07-13 NOTE — Evaluation (Signed)
Physical Therapy Evaluation Patient Details Name: Dale Edwards MRN: [id number] DOB: 08/08/1955 Today's Date: 07/13/2021  History of Present Illness  Pt is a 65 y/o male admitted after beeing found down. Has laceration on head that was closed in ED. Also found to have Rhabdomyolysis. PMH includes MS, COPD, alcohol abuse, tobacco abuse.  Clinical Impression  Pt admitted secondary to problem above with deficits below. Pt with increased jerking type motions in R extremities; pt reports this as baseline. Decreased safety awareness noted. Min to mod A for mobility tasks this session. Given current deficits, recommend SNF level therapies. Will continue to follow acutely.        Recommendations for follow up therapy are one component of a multi-disciplinary discharge planning process, led by the attending physician.  Recommendations may be updated based on patient status, additional functional criteria and insurance authorization.  Follow Up Recommendations Skilled nursing-short term rehab (<3 hours/day)    Assistance Recommended at Discharge Frequent or constant Supervision/Assistance  Functional Status Assessment Patient has had a recent decline in their functional status and demonstrates the ability to make significant improvements in function in a reasonable and predictable amount of time.  Equipment Recommendations  Rolling walker (2 wheels)    Recommendations for Other Services       Precautions / Restrictions Precautions Precautions: Fall Restrictions Weight Bearing Restrictions: No      Mobility  Bed Mobility Overal bed mobility: Needs Assistance Bed Mobility: Supine to Sit;Sit to Supine     Supine to sit: Mod assist Sit to supine: Min assist   General bed mobility comments: Required assist for trunk elevation and LE assist.    Transfers Overall transfer level: Needs assistance Equipment used: Rolling walker (2 wheels) Transfers: Sit to/from Stand Sit to Stand: Min  assist           General transfer comment: Min A for steadying assist. Increased tremors noted in standing.    Ambulation/Gait Ambulation/Gait assistance: Min assist Gait Distance (Feet): 10 Feet Assistive device: Rolling walker (2 wheels) Gait Pattern/deviations: Step-through pattern;Decreased stride length Gait velocity: Decreased     General Gait Details: Pt unsteady when ambulating and requiring min A and use of RW. Increased jerking type motions in L extremities.  Stairs            Wheelchair Mobility    Modified Rankin (Stroke Patients Only)       Balance Overall balance assessment: Needs assistance Sitting-balance support: No upper extremity supported;Feet supported       Standing balance support: Bilateral upper extremity supported Standing balance-Leahy Scale: Poor Standing balance comment: Reliant on BUE support                             Pertinent Vitals/Pain Pain Assessment: Faces Faces Pain Scale: Hurts little more Pain Location: generalized Pain Descriptors / Indicators: Grimacing;Guarding Pain Intervention(s): Limited activity within patient's tolerance;Monitored during session;Repositioned    Home Living Family/patient expects to be discharged to:: Shelter/Homeless                        Prior Function Prior Level of Function : Independent/Modified Independent             Mobility Comments: Uses cane for ambulatio normally       Hand Dominance        Extremity/Trunk Assessment   Upper Extremity Assessment Upper Extremity Assessment: Defer to OT evaluation (RUE jerking type  motions; pt reports this is baseline)    Lower Extremity Assessment Lower Extremity Assessment: RLE deficits/detail RLE Deficits / Details: Jerking type motions throughout RLE. Pt reports this is baseline    Cervical / Trunk Assessment Cervical / Trunk Assessment: Kyphotic  Communication   Communication: No difficulties   Cognition Arousal/Alertness: Awake/alert Behavior During Therapy: Restless Overall Cognitive Status: No family/caregiver present to determine baseline cognitive functioning                                 General Comments: Decreased safety awareness noted        General Comments      Exercises     Assessment/Plan    PT Assessment Patient needs continued PT services  PT Problem List Decreased strength;Decreased activity tolerance;Decreased balance;Decreased mobility;Decreased knowledge of use of DME;Decreased knowledge of precautions;Decreased safety awareness;Decreased cognition       PT Treatment Interventions DME instruction;Gait training;Functional mobility training;Therapeutic activities;Therapeutic exercise;Balance training;Patient/family education    PT Goals (Current goals can be found in the Care Plan section)  Acute Rehab PT Goals Patient Stated Goal: to feel better PT Goal Formulation: With patient Time For Goal Achievement: 07/27/21 Potential to Achieve Goals: Good    Frequency Min 3X/week   Barriers to discharge Inaccessible home environment;Decreased caregiver support      Co-evaluation               AM-PAC PT "6 Clicks" Mobility  Outcome Measure Help needed turning from your back to your side while in a flat bed without using bedrails?: A Lot Help needed moving from lying on your back to sitting on the side of a flat bed without using bedrails?: A Lot Help needed moving to and from a bed to a chair (including a wheelchair)?: A Little Help needed standing up from a chair using your arms (e.g., wheelchair or bedside chair)?: A Little Help needed to walk in hospital room?: A Lot Help needed climbing 3-5 steps with a railing? : Total 6 Click Score: 13    End of Session Equipment Utilized During Treatment: Gait belt Activity Tolerance: Patient tolerated treatment well Patient left: in bed;with call bell/phone within reach (on  stretcher in ED) Nurse Communication: Mobility status PT Visit Diagnosis: Unsteadiness on feet (R26.81);Muscle weakness (generalized) (M62.81)    Time: 6754-4920 PT Time Calculation (min) (ACUTE ONLY): 14 min   Charges:   PT Evaluation $PT Eval Moderate Complexity: 1 Mod          Farley Ly, PT, DPT  Acute Rehabilitation Services  Pager: [phone] Office: [phone]   Lehman Prom 07/13/2021, 2:46 PM

## 2021-07-13 NOTE — Progress Notes (Signed)
Received patient from ED via stretcher transported by staff.  Patient appears alert and oriented x 4. Denies any discomfort on arrival.  Multiple excoriation and abrasion noted on skin.  Weak gait. Condom catheter intact with clear, yellow urine noted and no sediments noted.  Able to swallow and chew with no issues noted.  Will continue to monitor.

## 2021-07-13 NOTE — Progress Notes (Addendum)
[facility] PROGRESS NOTE    Progress Note  Dale Edwards  YQI:[id number] DOB: [birth year] DOA: 07/12/2021 PCP: Elwyn Reach, MD     Brief Narrative:   Dale Edwards is an 65 y.o. male past medical history significant for alcohol abuse, tobacco abuse, homelessness who comes to the ED after being found on the ground with a laceration on the left forehead he does report consuming alcohol daily his drink of choice is whiskey with having several drinks before this episode CT of the head and neck showed no acute pathology, but it did show multiple lucencies along the cervical and thoracic body that were not present in 2016 imaging, there is some confluent groundglass density in the left lung apex was evaluated with a CT scan that showed interstitial opacity left greater than right favoring atypical infection.  He also has bullous emphysematous    Assessment/Plan:   Acute respiratory failure with hypoxia secondarily to  Aspiration [facility] Inebriated patient with a mechanical fall, with leukocytosis, lactic acid of 5. Admit to MedSurg agree with IV Unasyn, discontinue cefepime. Continue incentive spirometry, out of bed to chair consult physical therapy.  Rhabdomyolysis: The setting of a fall unknown downtime, less than 5000 discontinue lactic infusion. Transition to normal saline. Monitor strict I's and O's and daily weights recheck a basic metabolic panel.  Alcoholic ketosis: Discontinue LR, 30 normal saline. Lactic is elevated, his last drink was a few hours prior to admission.  Prolonged QTC: Check a mag and a phosphorus, try to keep magnesium greater than 2 phosphorus greater than 2 potassium greater than 4  Generalized weakness: He stated he has had multiple falls the worst one was last night. In the setting of alcohol consumption. Will consult physical therapy.  Alcohol use: Start him on thiamine and folate. Monitor with CIWA protocol check magnesium  and phosphorus.  Abnormal lucencies of the C-spine: PSA is pending.  Traumatic hematoma of the head: Secondary to fall laceration repaired by the ED physician.  Tobacco abuse: Nicotine patch.  Homelessness: Adult nurse.   DVT prophylaxis: lovenox Family Communication:none Status is: Inpatient  Remains inpatient appropriate because:         Code Status:     IV Access:   Peripheral IV   Procedures and diagnostic studies:   DG Chest 1 View  Result Date: 07/12/2021 CLINICAL DATA:  Unwitnessed fall EXAM: CHEST  1 VIEW COMPARISON:  03/25/2014 FINDINGS: Chronic changes in the right mid and lower lung compatible with scarring. Interstitial prominence within the left lung. Blunting of the right costophrenic angle, likely scarring. No effusion on the left. Heart is upper limits normal in size. No acute bony abnormality. IMPRESSION: Chronic changes on the right. Interstitial prominence within the left lung. This could reflect chronic lung disease, less likely infection. Electronically Signed   By: Rolm Baptise M.D.   On: 07/12/2021 17:43   DG Pelvis 1-2 Views  Result Date: 07/12/2021 CLINICAL DATA:  Fall EXAM: PELVIS - 1-2 VIEW COMPARISON:  None. FINDINGS: Hip joints and SI joints are symmetric. No acute bony abnormality. Specifically, no fracture, subluxation, or dislocation. IMPRESSION: No acute bony abnormality. Electronically Signed   By: Rolm Baptise M.D.   On: 07/12/2021 17:44   DG Knee 2 Views Left  Result Date: 07/12/2021 CLINICAL DATA:  Fall EXAM: LEFT KNEE - 1-2 VIEW COMPARISON:  None. FINDINGS: No acute bony abnormality. Specifically, no fracture, subluxation, or dislocation. Joint spaces maintained. No joint effusion. IMPRESSION: No acute bony abnormality. Electronically  Signed   By: Rolm Baptise M.D.   On: 07/12/2021 17:43   DG Knee 2 Views Right  Result Date: 07/12/2021 CLINICAL DATA:  Unwitnessed fall EXAM: RIGHT KNEE - 1-2 VIEW COMPARISON:   08/24/2014 FINDINGS: Old proximal right fibular fracture again seen, unchanged. No acute fracture, subluxation or dislocation. No joint effusion. Joint spaces maintained. IMPRESSION: No acute bony abnormality. Electronically Signed   By: Rolm Baptise M.D.   On: 07/12/2021 17:44   CT HEAD WO CONTRAST (5MM)  Result Date: 07/12/2021 CLINICAL DATA:  [place] yesterday. EXAM: CT HEAD WITHOUT CONTRAST CT CERVICAL SPINE WITHOUT CONTRAST TECHNIQUE: Multidetector CT imaging of the head and cervical spine was performed following the standard protocol without intravenous contrast. Multiplanar CT image reconstructions of the cervical spine were also generated. COMPARISON:  MRI brain dated May 08, 2019. MR cervical spine dated Nov 30, 2014. CT head dated August 06, 2012. FINDINGS: CT HEAD FINDINGS Brain: No evidence of acute infarction, hemorrhage, hydrocephalus, extra-axial collection or mass lesion/mass effect. Stable moderate atrophy and chronic microvascular ischemic changes. 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 Vascular: No hyperdense vessel or unexpected calcification. Skull: Normal. Negative for fracture or focal lesion. Sinuses/Orbits: No acute finding. Other: Small frontal scalp hematomas, greater on the left. CT CERVICAL SPINE FINDINGS Alignment: Normal. Skull base and vertebrae: No acute fracture. Multiple scattered subcentimeter lucent lesions in the visualized cervical and upper thoracic vertebral bodies, the largest at C6 measuring 6 mm (series 11, image 31). Soft tissues and spinal canal: No prevertebral fluid or swelling. No visible canal hematoma. Disc levels: Mild multilevel disc height loss and endplate spurring from T6-O0 through C6-C7. Severe uncovertebral hypertrophy at C6-C7. Upper chest: Scarring and bullous emphysematous changes in the right upper lobe. Confluent ground-glass density in the left lung apex. Other: None. IMPRESSION: 1. No acute intracranial abnormality.  Small frontal scalp hematomas, greater on the left. 2. No acute cervical spine fracture or traumatic listhesis. 3. Multiple scattered subcentimeter lucent lesions in the visualized cervical and upper thoracic vertebral bodies. These were not apparent on prior MRI cervical spine from 2016. Differential diagnosis includes multiple myeloma and metastatic disease. Appropriate oncologic workup is recommended. 4. Confluent ground-glass density in the left lung apex. This could represent pneumonia or aspiration. Consider further evaluation with chest CT. Electronically Signed   By: Titus Dubin M.D.   On: 07/12/2021 18:23   CT CHEST W CONTRAST  Result Date: 07/12/2021 CLINICAL DATA:  Evaluate left apical ground-glass infiltrates on cervical spine CT today, which also demonstrated subcentimeter lucent lesions in the cervical and upper thoracic spine consistent with Mets or myeloma. EXAM: CT CHEST WITH CONTRAST TECHNIQUE: Multidetector CT imaging of the chest was performed during intravenous contrast administration. CONTRAST:  28m OMNIPAQUE IOHEXOL 300 MG/ML  SOLN COMPARISON:  Portable chest today, CTA chest 03/26/2014. FINDINGS: Cardiovascular: Increased prominence of the pulmonary trunk indicating arterial hypertension. There is no ce2ntral embolus. There is homogeneous aortic and great vessel enhancement, with patchy calcification in the arch. There is no pericardial effusion. The cardiac size is normal . Lipomatous hypertrophy of the intra-atrial septum is again noted. There are scattered three-vessel coronary artery calcifications. Mediastinum/Nodes: Normal thyroid and thoracic esophagus. New finding of slightly prominent left hilar nodes up to 1.3 cm in short axis, with new left prevascular space and precarinal space nodes up to 1 cm in short axis and a single prominent subcarinal lymph node to the right of 1.2 cm short axis. No right hilar adenopathy, axillary or  supraclavicular masses. Lungs/Pleura: Interval  worsening bullous disease and increasing scar-like opacities in the right middle and lower lobe bases, increased bullous disease in the apical and anterior right upper lobe with scarring change, and chronic right lateral base pleuroparenchymal scarring again is noted with chronic posterior right pleural thickening. No pleural effusion. There are mild increased paraseptal emphysematous changes in the left upper lobe. There is interval development of interstitial and patchy ground-glass opacities, widespread within the left upper and lower lobes with relative apical and lower lobe superior segment sparing with similar opacities noted anteriorly in the right upper lobe and patchily in the basal segments of the right lower lobe, but less dense and less extensive. The trachea and central airways are free of filling defect. Upper Abdomen: No acute abnormality. Mild-to-moderate hepatic steatosis. Scattered calcified hepatic granulomas. Musculoskeletal: No chest wall abnormality. No acute or significant osseous findings. There is degenerative disc disease and spondylosis in the thoracic spine. There are few tiny lucent lesions suspected in the head of the left clavicle and in the upper 2 thoracic vertebrae. No other focal bone lesion is seen. IMPRESSION: 1. Interstitial and patchy ground-glass opacities of left-greater-than-right lungs, favor pneumonic process including viral and atypical agents. Edema component less likely given lack of pleural effusions and venous distention. Alveolar proteinosis is also possible but rare. Follow-up study recommended to ensure clearing after treatment. 2. 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 Additional chronic pleural-parenchymal disease on the right. 3. Development of mildly prominent mediastinal and left hilar nodes, possibly reactive. No bulky or encasing adenopathy. 4. Aortic and coronary artery  atherosclerosis. 5. Slightly prominent pulmonary trunk increased from previous exam. Lipomatous hypertrophy interatrial septum. 6. Fatty liver. 7. A few tiny lucent lesions suspected in the left clavicle head and in the upper 2 thoracic vertebrae. No destructive or expansile bone lesion. Electronically Signed   By: Telford Nab M.D.   On: 07/12/2021 21:45   CT CERVICAL SPINE WO CONTRAST  Result Date: 07/12/2021 CLINICAL DATA:  [place] yesterday. EXAM: CT HEAD WITHOUT CONTRAST CT CERVICAL SPINE WITHOUT CONTRAST TECHNIQUE: Multidetector CT imaging of the head and cervical spine was performed following the standard protocol without intravenous contrast. Multiplanar CT image reconstructions of the cervical spine were also generated. COMPARISON:  MRI brain dated May 08, 2019. MR cervical spine dated Nov 30, 2014. CT head dated August 06, 2012. FINDINGS: CT HEAD FINDINGS Brain: No evidence of acute infarction, hemorrhage, hydrocephalus, extra-axial collection or mass lesion/mass effect. Stable moderate atrophy and chronic microvascular ischemic changes. 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 Vascular: No hyperdense vessel or unexpected calcification. Skull: Normal. Negative for fracture or focal lesion. Sinuses/Orbits: No acute finding. Other: Small frontal scalp hematomas, greater on the left. CT CERVICAL SPINE FINDINGS Alignment: Normal. Skull base and vertebrae: No acute fracture. Multiple scattered subcentimeter lucent lesions in the visualized cervical and upper thoracic vertebral bodies, the largest at C6 measuring 6 mm (series 11, image 31). Soft tissues and spinal canal: No prevertebral fluid or swelling. No visible canal hematoma. Disc levels: Mild multilevel disc height loss and endplate spurring from Q9-U7 through C6-C7. Severe uncovertebral hypertrophy at C6-C7. Upper chest: Scarring and bullous emphysematous changes in the right upper lobe. Confluent ground-glass density in  the left lung apex. Other: None. IMPRESSION: 1. No acute intracranial abnormality. Small frontal scalp hematomas, greater on the left. 2. No acute cervical spine fracture or traumatic listhesis. 3. Multiple scattered subcentimeter lucent  lesions in the visualized cervical and upper thoracic vertebral bodies. These were not apparent on prior MRI cervical spine from 2016. Differential diagnosis includes multiple myeloma and metastatic disease. Appropriate oncologic workup is recommended. 4. Confluent ground-glass density in the left lung apex. This could represent pneumonia or aspiration. Consider further evaluation with chest CT. Electronically Signed   By: Titus Dubin M.D.   On: 07/12/2021 18:23     Medical Consultants:   None.   Subjective:    Dale Edwards relates he is sleepy and tired he expresses voluntarily he is inebriated.  Objective:    Vitals:   07/13/21 0200 07/13/21 0215 07/13/21 0435 07/13/21 0600  BP: 125/67 122/65 122/65 124/69  Pulse: (!) 114 (!) 113 (!) 113 (!) 106  Resp:  18  20  Temp:      TempSrc:      SpO2: 92% 93%  97%   SpO2: 97 % O2 Flow Rate (L/min): 3 L/min   Intake/Output Summary (Last 24 hours) at 07/13/2021 0731 Last data filed at 07/13/2021 0559 Gross per 24 hour  Intake 1510.48 ml  Output --  Net 1510.48 ml   There were no vitals filed for this visit.  Exam: General exam: In no acute distress. Respiratory system: Good air movement and clear to auscultation. Cardiovascular system: S1 & S2 heard, RRR. No JVD. Gastrointestinal system: Abdomen is nondistended, soft and nontender.  Extremities: No pedal edema. Skin: No rashes, lesions or ulcers Psychiatry: Judgement and insight appear normal. Mood & affect appropriate.    Data Reviewed:    Labs: Basic Metabolic Panel: Recent Labs  Lab 07/09/21 2242 07/12/21 1648 07/12/21 1725  NA 136 137 136   136  K 4.3 4.4 4.3   4.4  CL 101 104 106  CO2 27 14*  --   GLUCOSE 94 78 77   BUN 19 22 25*  CREATININE 1.07 0.86 0.80  CALCIUM 9.0 7.9*  --   MG  --  1.9  --    GFR CrCl cannot be calculated (Unknown ideal weight.). Liver Function Tests: Recent Labs  Lab 07/09/21 2242 07/12/21 1648  AST 38 89*  ALT 25 51*  ALKPHOS 92 82  BILITOT 3.5* 1.6*  PROT 6.6 5.9*  ALBUMIN 4.0 3.0*   No results for input(s): LIPASE, AMYLASE in the last 168 hours. Recent Labs  Lab 07/12/21 1648  AMMONIA 48*   Coagulation profile Recent Labs  Lab 07/09/21 2242 07/12/21 1648  INR 1.0 1.1   COVID-19 Labs  No results for input(s): DDIMER, FERRITIN, LDH, CRP in the last 72 hours.  Lab Results  Component Value Date   [place] NEGATIVE 07/12/2021    CBC: Recent Labs  Lab 07/09/21 2242 07/12/21 1648 07/12/21 1725  WBC 13.5* 18.2*  --   NEUTROABS 11.0* 16.6*  --   HGB 17.5* 16.4 16.3   16.7  HCT 52.5* 49.1 48.0   49.0  MCV 94.8 96.8  --   PLT 244 184  --    Cardiac Enzymes: Recent Labs  Lab 07/12/21 1648  CKTOTAL 1,200*   BNP (last 3 results) No results for input(s): PROBNP in the last 8760 hours. CBG: Recent Labs  Lab 07/12/21 2125 07/12/21 2147 07/12/21 2227  GLUCAP 58* 64* 116*   D-Dimer: No results for input(s): DDIMER in the last 72 hours. Hgb A1c: No results for input(s): HGBA1C in the last 72 hours. Lipid Profile: No results for input(s): CHOL, HDL, LDLCALC, TRIG, CHOLHDL, LDLDIRECT in the last 72  hours. Thyroid function studies: No results for input(s): TSH, T4TOTAL, T3FREE, THYROIDAB in the last 72 hours.  Invalid input(s): FREET3 Anemia work up: No results for input(s): VITAMINB12, FOLATE, FERRITIN, TIBC, IRON, RETICCTPCT in the last 72 hours. Sepsis Labs: Recent Labs  Lab 07/09/21 2242 07/12/21 1648  WBC 13.5* 18.2*  LATICACIDVEN  --  5.3*   Microbiology Recent Results (from the past 240 hour(s))  Resp Panel by RT-PCR (Flu A&B, Covid) Nasopharyngeal Swab     Status: None   Collection Time: 07/12/21  5:07 PM   Specimen:  Nasopharyngeal Swab; Nasopharyngeal(NP) swabs in vial transport medium  Result Value Ref Range Status   SARS Coronavirus 2 by RT PCR NEGATIVE NEGATIVE Final    Comment: (NOTE) SARS-CoV-2 target nucleic acids are NOT DETECTED.  The SARS-CoV-2 RNA is generally detectable in upper respiratory specimens during the acute phase of infection. The lowest concentration of SARS-CoV-2 viral copies this assay can detect is 138 copies/mL. A negative result does not preclude SARS-Cov-2 infection and should not be used as the sole basis for treatment or other patient management decisions. A negative result may occur with  improper specimen collection/handling, submission of specimen other than nasopharyngeal swab, presence of viral mutation(s) within the areas targeted by this assay, and inadequate number of viral copies(<138 copies/mL). A negative result must be combined with clinical observations, patient history, and epidemiological information. The expected result is Negative.  Fact Sheet for Patients:  EntrepreneurPulse.com.au  Fact Sheet for Healthcare Providers:  IncredibleEmployment.be  This test is no t yet approved or cleared by the [place] FDA and  has been authorized for detection and/or diagnosis of SARS-CoV-2 by FDA under an Emergency Use Authorization (EUA). This EUA will remain  in effect (meaning this test can be used) for the duration of the COVID-19 declaration under Section 564(b)(1) of the Act, 21 U.S.C.section 360bbb-3(b)(1), unless the authorization is terminated  or revoked sooner.       Influenza A by PCR NEGATIVE NEGATIVE Final   Influenza B by PCR NEGATIVE NEGATIVE Final    Comment: (NOTE) The Xpert Xpress SARS-CoV-2/FLU/RSV plus assay is intended as an aid in the diagnosis of influenza from Nasopharyngeal swab specimens and should not be used as a sole basis for treatment. Nasal washings and aspirates are unacceptable for  Xpert Xpress SARS-CoV-2/FLU/RSV testing.  Fact Sheet for Patients: EntrepreneurPulse.com.au  Fact Sheet for Healthcare Providers: IncredibleEmployment.be  This test is not yet approved or cleared by the [place] FDA and has been authorized for detection and/or diagnosis of SARS-CoV-2 by FDA under an Emergency Use Authorization (EUA). This EUA will remain in effect (meaning this test can be used) for the duration of the COVID-19 declaration under Section 564(b)(1) of the Act, 21 U.S.C. section 360bbb-3(b)(1), unless the authorization is terminated or revoked.  Performed at [facility], [place] [address] [place], [place] [address]   Blood culture (routine x 2)     Status: None (Preliminary result)   Collection Time: 07/12/21  6:43 PM   Specimen: BLOOD RIGHT HAND  Result Value Ref Range Status   Specimen Description BLOOD RIGHT HAND  Final   Special Requests   Final    BOTTLES DRAWN AEROBIC AND ANAEROBIC Blood Culture results may not be optimal due to an inadequate volume of blood received in culture bottles   Culture   Final    NO GROWTH < 12 HOURS Performed at [facility], [place] [address] [place], [place] [address]  Report Status PENDING  Incomplete     Medications:    folic acid  1 mg Oral Daily   multivitamin with minerals  1 tablet Oral Daily   thiamine  100 mg Oral Daily   Or   thiamine  100 mg Intravenous Daily   Continuous Infusions:  ceFEPime (MAXIPIME) IV Stopped (07/13/21 0559)   lactated ringers 150 mL/hr at 07/13/21 0418   vancomycin        LOS: 1 day   Charlynne Cousins  [facility]  07/13/2021, 7:31 AM

## 2021-07-13 NOTE — Plan of Care (Signed)

## 2021-07-13 NOTE — ED Notes (Addendum)
RN noted pts soiled linens. Pt linens changed, primofit applied.

## 2021-07-14 LAB — BASIC METABOLIC PANEL
Anion gap: 9 (ref 5–15)
BUN: 17 mg/dL (ref 8–23)
CO2: 22 mmol/L (ref 22–32)
Calcium: 8 mg/dL — ABNORMAL LOW (ref 8.9–10.3)
Chloride: 104 mmol/L (ref 98–111)
Creatinine, Ser: 0.83 mg/dL (ref 0.61–1.24)
GFR, Estimated: >60 mL/min (ref >60)
Glucose, Bld: 99 mg/dL (ref 70–99)
Potassium: 3.6 mmol/L (ref 3.5–5.1)
Sodium: 135 mmol/L (ref 135–145)

## 2021-07-14 LAB — CBC WITH DIFFERENTIAL/PLATELET
Abs Immature Granulocytes: 0.05 10*3/uL (ref 0.00–0.07)
Basophils Absolute: 0 10*3/uL (ref 0.0–0.1)
Basophils Relative: 1 %
Eosinophils Absolute: 0.1 10*3/uL (ref 0.0–0.5)
Eosinophils Relative: 1 %
HCT: 39.7 % (ref 39.0–52.0)
Hemoglobin: 13.9 g/dL (ref 13.0–17.0)
Immature Granulocytes: 1 %
Lymphocytes Relative: 12 %
Lymphs Abs: 1 10*3/uL (ref 0.7–4.0)
MCH: 32.6 pg (ref 26.0–34.0)
MCHC: 35 g/dL (ref 30.0–36.0)
MCV: 93.2 fL (ref 80.0–100.0)
Monocytes Absolute: 0.8 10*3/uL (ref 0.1–1.0)
Monocytes Relative: 10 %
Neutro Abs: 5.8 10*3/uL (ref 1.7–7.7)
Neutrophils Relative %: 75 %
Platelets: 148 10*3/uL — ABNORMAL LOW (ref 150–400)
RBC: 4.26 MIL/uL (ref 4.22–5.81)
RDW: 13.4 % (ref 11.5–15.5)
WBC: 7.8 10*3/uL (ref 4.0–10.5)
nRBC: 0 % (ref 0.0–0.2)

## 2021-07-14 LAB — CK: Total CK: 681 U/L — ABNORMAL HIGH (ref 49–397)

## 2021-07-14 MED ORDER — METOPROLOL TARTRATE 12.5 MG HALF TABLET
12.5000 mg | ORAL_TABLET | Freq: Two times a day (BID) | ORAL | Status: DC
  Administered 2021-07-14 – 2021-07-25 (×16): 12.5 mg via ORAL
  Filled 2021-07-14 (×22): qty 1

## 2021-07-14 MED ORDER — POTASSIUM CHLORIDE CRYS ER 20 MEQ PO TBCR
40.0000 meq | EXTENDED_RELEASE_TABLET | Freq: Two times a day (BID) | ORAL | Status: AC
  Administered 2021-07-14 (×2): 40 meq via ORAL
  Filled 2021-07-14 (×2): qty 2

## 2021-07-14 NOTE — TOC Initial Note (Signed)
Transition of Care [facility]) - Initial/Assessment Note    Patient Details  Name: Dale Edwards MRN: [id number] Date of Birth: [birth year]  [facility]) CM/SW Contact:    [address], [place] Phone Number: 07/14/2021, 1:51 PM  Clinical Narrative:                 CSW met with pt at bedside. Pt stated he is homeless for the first time in his life. Pt states he does consume alcohol and understands this will cause difficulty in placement. Pt is not vaccinated and chooses not to get any vaccines during this hospital stay. Pt stated "I'm not even sure if the vaccines work". Pt states he has no family nor community supports. CSW provided substance abuse resources as well as homelessness resources. Pt states he receives $1100 a month from disability. CSW spoke with pt about going to an ALF or a group home, pt seemed hesitant and asked if  he could have his own room, CSW advised that this would be up to the facility. CSW advised that if pt goes to an ALF or a boarding home he will be required to turn his check over to that facility, pt stated he didn't want to do that. Pt didn't seem agreeable to SNF placement and was vague in responses as it related to placement. For these reasons and the alcohol barrier CSW will not fax out at this time.         Patient Goals and CMS Choice        Expected Discharge Plan and Services                                                Prior Living Arrangements/Services                       Activities of Daily Living Home Assistive Devices/Equipment: None ADL Screening (condition at time of admission) Patient's cognitive ability adequate to safely complete daily activities?: Yes Is the patient deaf or have difficulty hearing?: No Does the patient have difficulty seeing, even when wearing glasses/contacts?: No Does the patient have difficulty concentrating, remembering, or making decisions?: Yes Patient able to express need  for assistance with ADLs?: Yes Does the patient have difficulty dressing or bathing?: No Independently performs ADLs?: Yes (appropriate for developmental age) Does the patient have difficulty walking or climbing stairs?: No Weakness of Legs: Both Weakness of Arms/Hands: None  Permission Sought/Granted                  Emotional Assessment              Admission diagnosis:  Lactic acidosis [E87.20] Aspiration pneumonia ([place]) [J69.0] Fall [W19.XXXA] Aspiration pneumonia of right lower lobe, unspecified aspiration pneumonia type [facility]) [J69.0] Patient Active Problem List   Diagnosis Date Noted   Aspiration pneumonia ([place]) 07/12/2021   Generalized weakness 07/12/2021   Rhabdomyolysis 07/12/2021   Traumatic hematoma of head 07/12/2021   Abnormal finding on lung imaging 07/12/2021   Abnormal CT scan, neck 07/12/2021   Tobacco abuse 07/12/2021   Alcohol abuse 07/12/2021   Homeless 07/12/2021   COPD (chronic obstructive pulmonary disease) ([place]) 07/12/2021   Prolonged QT interval 07/12/2021   Relapsing remitting multiple sclerosis ([place]) 10/12/2019   Tremor of right hand 10/12/2019   Gait abnormality 10/12/2019  Tremor 10/30/2017   Vitamin D deficiency 04/10/2017   Lumbar radiculopathy 11/15/2014   Low back pain 08/01/2014   Multiple sclerosis ([place]) 02/09/2013   Abnormality of gait 01/08/2013   Right sided weakness 01/08/2013   EMPHYSEMA 02/17/2009   DYSPNEA 02/17/2009   Nonspecific (abnormal) findings on radiological and other examination of body structure 02/17/2009   CT, CHEST, ABNORMAL 02/17/2009   PCP:  Elwyn Reach, MD Pharmacy:   [facility] [place], [place] [address] Phone: [phone] Fax: [phone]     Social Determinants of Health (SDOH) Interventions    Readmission Risk Interventions No flowsheet data found.

## 2021-07-14 NOTE — Plan of Care (Signed)
VSS. Bed alarm on at all time, patient non compliant with rules. Very unsteady on feet. Has been educated and reinforced patient continues to do the same things. Patient has no complaints at this time.   Problem: Clinical Measurements: Goal: Ability to maintain clinical measurements within normal limits will improve Outcome: Progressing Goal: Will remain free from infection Outcome: Progressing Goal: Diagnostic test results will improve Outcome: Progressing Goal: Respiratory complications will improve Outcome: Progressing Goal: Cardiovascular complication will be avoided Outcome: Progressing   Problem: Elimination: Goal: Will not experience complications related to bowel motility Outcome: Progressing Goal: Will not experience complications related to urinary retention Outcome: Progressing   Problem: Pain Managment: Goal: General experience of comfort will improve Outcome: Progressing   Problem: Skin Integrity: Goal: Risk for impaired skin integrity will decrease Outcome: Progressing   Problem: Safety: Goal: Ability to remain free from injury will improve Outcome: Progressing

## 2021-07-14 NOTE — Evaluation (Signed)
Occupational Therapy Evaluation Patient Details Name: Dale Edwards MRN: [id number] DOB: 1956/01/04 Today's Date: 07/14/2021   History of Present Illness Pt is a 65 y/o male admitted after beeing found down. Has laceration on head that was closed in ED. Also found to have Rhabdomyolysis. PMH includes MS, COPD, alcohol abuse, tobacco abuse.   Clinical Impression   Pt presents with decreased balance, strength, and activity tolerance. Currently requiring Mod A for LB ADLs and Min A for functional transfers/mobility. Pt primarily independent at baseline, however reports ADLs and mobility have been becoming increasingly difficult lately. Pt also homeless with limited support available. Would likely benefit from SNF for further rehab to facilitate safe d/c. Will follow acutely to maximize safety/independence with ADLs and functional transfers/mobility.      Recommendations for follow up therapy are one component of a multi-disciplinary discharge planning process, led by the attending physician.  Recommendations may be updated based on patient status, additional functional criteria and insurance authorization.   Follow Up Recommendations  Skilled nursing-short term rehab (<3 hours/day)    Assistance Recommended at Discharge Frequent or constant Supervision/Assistance  Functional Status Assessment  Patient has had a recent decline in their functional status and demonstrates the ability to make significant improvements in function in a reasonable and predictable amount of time.  Equipment Recommendations  Other (comment) (Defer)    Recommendations for Other Services       Precautions / Restrictions Precautions Precautions: Fall Precaution Comments: Monitor O2 Restrictions Weight Bearing Restrictions: No      Mobility Bed Mobility Overal bed mobility: Needs Assistance Bed Mobility: Supine to Sit     Supine to sit: Min guard          Transfers Overall transfer level: Needs  assistance Equipment used: Rolling walker (2 wheels) Transfers: Sit to/from Stand;Bed to chair/wheelchair/BSC Sit to Stand: Min assist;From elevated surface     Step pivot transfers: Min assist            Balance Overall balance assessment: Needs assistance Sitting-balance support: No upper extremity supported;Feet supported Sitting balance-Leahy Scale: Fair     Standing balance support: Bilateral upper extremity supported Standing balance-Leahy Scale: Poor                             ADL either performed or assessed with clinical judgement   ADL Overall ADL's : Needs assistance/impaired Eating/Feeding: Independent   Grooming: Set up;Sitting   Upper Body Bathing: Set up;Sitting   Lower Body Bathing: Moderate assistance;Sitting/lateral leans   Upper Body Dressing : Set up;Sitting   Lower Body Dressing: Moderate assistance;Sitting/lateral leans   Toilet Transfer: Minimal assistance;Stand-pivot;BSC/3in1;Rolling walker (2 wheels)   Toileting- Clothing Manipulation and Hygiene: Supervision/safety       Functional mobility during ADLs: Minimal assistance;Rolling walker (2 wheels)       Vision Patient Visual Report: No change from baseline       Perception     Praxis      Pertinent Vitals/Pain Pain Assessment: No/denies pain     Hand Dominance     Extremity/Trunk Assessment Upper Extremity Assessment Upper Extremity Assessment: Generalized weakness;RUE deficits/detail RUE Deficits / Details: Ataxic movements. Pt reports this is baseline. RUE Coordination: decreased fine motor;decreased gross motor   Lower Extremity Assessment Lower Extremity Assessment: Defer to PT evaluation   Cervical / Trunk Assessment Cervical / Trunk Assessment: Kyphotic   Communication Communication Communication: No difficulties   Cognition Arousal/Alertness: Awake/alert Behavior During Therapy:  WFL for tasks assessed/performed Overall Cognitive Status: Within  Functional Limits for tasks assessed                                       General Comments  SpO2 75% in supine on RA on arrival. Placed [place] on pt that was set to 2L and SpO2 increased to 82%. Titrated to 3L and SpO2 89%, maintained during transfer.    Exercises     Shoulder Instructions      Home Living Family/patient expects to be discharged to:: Shelter/Homeless                                        Prior Functioning/Environment Prior Level of Function : Independent/Modified Independent             Mobility Comments: Uses cane for ambulatio normally          OT Problem List: Decreased strength;Decreased activity tolerance;Impaired balance (sitting and/or standing);Decreased coordination;Decreased knowledge of precautions;Cardiopulmonary status limiting activity      OT Treatment/Interventions: Self-care/ADL training;Therapeutic exercise;Energy conservation;Neuromuscular education;DME and/or AE instruction;Therapeutic activities;Patient/family education;Balance training    OT Goals(Current goals can be found in the care plan section) Acute Rehab OT Goals Patient Stated Goal: SNF for rehab OT Goal Formulation: With patient Time For Goal Achievement: 07/28/21 Potential to Achieve Goals: Good  OT Frequency: Min 2X/week   Barriers to D/C: Decreased caregiver support          Co-evaluation              AM-PAC OT "6 Clicks" Daily Activity     Outcome Measure Help from another person eating meals?: None Help from another person taking care of personal grooming?: A Little Help from another person toileting, which includes using toliet, bedpan, or urinal?: A Little Help from another person bathing (including washing, rinsing, drying)?: A Lot Help from another person to put on and taking off regular upper body clothing?: A Little Help from another person to put on and taking off regular lower body clothing?: A Lot 6 Click Score:  17   End of Session Equipment Utilized During Treatment: Gait belt;Rolling walker (2 wheels) Nurse Communication: Mobility status  Activity Tolerance: Patient limited by fatigue Patient left: in chair;with call bell/phone within reach  OT Visit Diagnosis: Unsteadiness on feet (R26.81);Muscle weakness (generalized) (M62.81);History of falling (Z91.81);Ataxia, unspecified (R27.0)                Time: 3267-1245 OT Time Calculation (min): 24 min Charges:  OT General Charges $OT Visit: 1 Visit OT Evaluation $OT Eval Moderate Complexity: 1 Mod OT Treatments $Therapeutic Activity: 8-22 mins  Dayten Juba C, OT/L  Acute Rehab [phone]  [name] 07/14/2021, 8:49 AM

## 2021-07-14 NOTE — Plan of Care (Signed)

## 2021-07-14 NOTE — Progress Notes (Signed)
[facility] PROGRESS NOTE    Progress Note  Dale Edwards  LNL:[id number] DOB: 03-07-1956 DOA: 07/12/2021 PCP: Elwyn Reach, MD     Brief Narrative:   Dale Edwards is an 65 y.o. male past medical history significant for alcohol abuse, tobacco abuse, homelessness who comes to the ED after being found on the ground with a laceration on the left forehead he does report consuming alcohol daily his drink of choice is whiskey with having several drinks before this episode CT of the head and neck showed no acute pathology, but it did show multiple lucencies along the cervical and thoracic body that were not present in 2016 imaging, there is some confluent groundglass density in the left lung apex was evaluated with a CT scan that showed interstitial opacity left greater than right favoring atypical infection.  He also has bullous emphysematous    Assessment/Plan:   Acute respiratory failure with hypoxia secondarily to  Aspiration pneumonia [facility]): Inebriated patient with a mechanical fall, with leukocytosis, lactic acid of 5. Still requiring 3 L of oxygen to keep saturations greater 90%.  Continues into spirometry flutter valve out of bed to chair. Continue IV Unasyn.  Rhabdomyolysis: The setting of a fall unknown downtime. Resolved with IV fluid hydration KVO IV fluids.  Alcoholic ketosis: Lactic is elevated, his last drink was a few hours prior to admission. Now resolved.  Prolonged QTC: Check a mag and a phosphorus, try to keep magnesium greater than 2 phosphorus greater than 2 potassium greater than 4  Generalized weakness: He stated he has had multiple falls the worst one was last night. In the setting of alcohol consumption. Will consult physical therapy.  Alcohol use: Start him on thiamine and folate. Monitor with CIWA protocol check magnesium and phosphorus.  Abnormal lucencies of the C-spine: PSA is pending.  Traumatic hematoma of the head: Secondary  to fall laceration repaired by the ED physician.  Tobacco abuse: Nicotine patch.  Homelessness: Adult nurse.   DVT prophylaxis: lovenox Family Communication:none Status is: Inpatient  Remains inpatient appropriate because:         Code Status:     IV Access:   Peripheral IV   Procedures and diagnostic studies:   DG Chest 1 View  Result Date: 07/12/2021 CLINICAL DATA:  Unwitnessed fall EXAM: CHEST  1 VIEW COMPARISON:  03/25/2014 FINDINGS: Chronic changes in the right mid and lower lung compatible with scarring. Interstitial prominence within the left lung. Blunting of the right costophrenic angle, likely scarring. No effusion on the left. Heart is upper limits normal in size. No acute bony abnormality. IMPRESSION: Chronic changes on the right. Interstitial prominence within the left lung. This could reflect chronic lung disease, less likely infection. Electronically Signed   By: Rolm Baptise M.D.   On: 07/12/2021 17:43   DG Pelvis 1-2 Views  Result Date: 07/12/2021 CLINICAL DATA:  Fall EXAM: PELVIS - 1-2 VIEW COMPARISON:  None. FINDINGS: Hip joints and SI joints are symmetric. No acute bony abnormality. Specifically, no fracture, subluxation, or dislocation. IMPRESSION: No acute bony abnormality. Electronically Signed   By: Rolm Baptise M.D.   On: 07/12/2021 17:44   DG Knee 2 Views Left  Result Date: 07/12/2021 CLINICAL DATA:  Fall EXAM: LEFT KNEE - 1-2 VIEW COMPARISON:  None. FINDINGS: No acute bony abnormality. Specifically, no fracture, subluxation, or dislocation. Joint spaces maintained. No joint effusion. IMPRESSION: No acute bony abnormality. Electronically Signed   By: Rolm Baptise M.D.   On: 07/12/2021 17:43  DG Knee 2 Views Right  Result Date: 07/12/2021 CLINICAL DATA:  Unwitnessed fall EXAM: RIGHT KNEE - 1-2 VIEW COMPARISON:  08/24/2014 FINDINGS: Old proximal right fibular fracture again seen, unchanged. No acute fracture, subluxation or  dislocation. No joint effusion. Joint spaces maintained. IMPRESSION: No acute bony abnormality. Electronically Signed   By: Rolm Baptise M.D.   On: 07/12/2021 17:44   CT HEAD WO CONTRAST (5MM)  Result Date: 07/12/2021 CLINICAL DATA:  [place] yesterday. EXAM: CT HEAD WITHOUT CONTRAST CT CERVICAL SPINE WITHOUT CONTRAST TECHNIQUE: Multidetector CT imaging of the head and cervical spine was performed following the standard protocol without intravenous contrast. Multiplanar CT image reconstructions of the cervical spine were also generated. COMPARISON:  MRI brain dated May 08, 2019. MR cervical spine dated Nov 30, 2014. CT head dated August 06, 2012. FINDINGS: CT HEAD FINDINGS Brain: No evidence of acute infarction, hemorrhage, hydrocephalus, extra-axial collection or mass lesion/mass effect. Stable moderate atrophy and chronic microvascular ischemic changes. 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 Vascular: No hyperdense vessel or unexpected calcification. Skull: Normal. Negative for fracture or focal lesion. Sinuses/Orbits: No acute finding. Other: Small frontal scalp hematomas, greater on the left. CT CERVICAL SPINE FINDINGS Alignment: Normal. Skull base and vertebrae: No acute fracture. Multiple scattered subcentimeter lucent lesions in the visualized cervical and upper thoracic vertebral bodies, the largest at C6 measuring 6 mm (series 11, image 31). Soft tissues and spinal canal: No prevertebral fluid or swelling. No visible canal hematoma. Disc levels: Mild multilevel disc height loss and endplate spurring from P3-I9 through C6-C7. Severe uncovertebral hypertrophy at C6-C7. Upper chest: Scarring and bullous emphysematous changes in the right upper lobe. Confluent ground-glass density in the left lung apex. Other: None. IMPRESSION: 1. No acute intracranial abnormality. Small frontal scalp hematomas, greater on the left. 2. No acute cervical spine fracture or traumatic listhesis. 3.  Multiple scattered subcentimeter lucent lesions in the visualized cervical and upper thoracic vertebral bodies. These were not apparent on prior MRI cervical spine from 2016. Differential diagnosis includes multiple myeloma and metastatic disease. Appropriate oncologic workup is recommended. 4. Confluent ground-glass density in the left lung apex. This could represent pneumonia or aspiration. Consider further evaluation with chest CT. Electronically Signed   By: Titus Dubin M.D.   On: 07/12/2021 18:23   CT CHEST W CONTRAST  Result Date: 07/12/2021 CLINICAL DATA:  Evaluate left apical ground-glass infiltrates on cervical spine CT today, which also demonstrated subcentimeter lucent lesions in the cervical and upper thoracic spine consistent with Mets or myeloma. EXAM: CT CHEST WITH CONTRAST TECHNIQUE: Multidetector CT imaging of the chest was performed during intravenous contrast administration. CONTRAST:  70m OMNIPAQUE IOHEXOL 300 MG/ML  SOLN COMPARISON:  Portable chest today, CTA chest 03/26/2014. FINDINGS: Cardiovascular: Increased prominence of the pulmonary trunk indicating arterial hypertension. There is no ce2ntral embolus. There is homogeneous aortic and great vessel enhancement, with patchy calcification in the arch. There is no pericardial effusion. The cardiac size is normal . Lipomatous hypertrophy of the intra-atrial septum is again noted. There are scattered three-vessel coronary artery calcifications. Mediastinum/Nodes: Normal thyroid and thoracic esophagus. New finding of slightly prominent left hilar nodes up to 1.3 cm in short axis, with new left prevascular space and precarinal space nodes up to 1 cm in short axis and a single prominent subcarinal lymph node to the right of 1.2 cm short axis. No right hilar adenopathy, axillary or supraclavicular masses. Lungs/Pleura: Interval worsening bullous disease and increasing scar-like opacities in the right middle  and lower lobe bases, increased  bullous disease in the apical and anterior right upper lobe with scarring change, and chronic right lateral base pleuroparenchymal scarring again is noted with chronic posterior right pleural thickening. No pleural effusion. There are mild increased paraseptal emphysematous changes in the left upper lobe. There is interval development of interstitial and patchy ground-glass opacities, widespread within the left upper and lower lobes with relative apical and lower lobe superior segment sparing with similar opacities noted anteriorly in the right upper lobe and patchily in the basal segments of the right lower lobe, but less dense and less extensive. The trachea and central airways are free of filling defect. Upper Abdomen: No acute abnormality. Mild-to-moderate hepatic steatosis. Scattered calcified hepatic granulomas. Musculoskeletal: No chest wall abnormality. No acute or significant osseous findings. There is degenerative disc disease and spondylosis in the thoracic spine. There are few tiny lucent lesions suspected in the head of the left clavicle and in the upper 2 thoracic vertebrae. No other focal bone lesion is seen. IMPRESSION: 1. Interstitial and patchy ground-glass opacities of left-greater-than-right lungs, favor pneumonic process including viral and atypical agents. Edema component less likely given lack of pleural effusions and venous distention. Alveolar proteinosis is also possible but rare. Follow-up study recommended to ensure clearing after treatment. 2. 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 Additional chronic pleural-parenchymal disease on the right. 3. Development of mildly prominent mediastinal and left hilar nodes, possibly reactive. No bulky or encasing adenopathy. 4. Aortic and coronary artery atherosclerosis. 5. Slightly prominent pulmonary trunk increased from previous exam. Lipomatous hypertrophy  interatrial septum. 6. Fatty liver. 7. A few tiny lucent lesions suspected in the left clavicle head and in the upper 2 thoracic vertebrae. No destructive or expansile bone lesion. Electronically Signed   By: Telford Nab M.D.   On: 07/12/2021 21:45   CT CERVICAL SPINE WO CONTRAST  Result Date: 07/12/2021 CLINICAL DATA:  [place] yesterday. EXAM: CT HEAD WITHOUT CONTRAST CT CERVICAL SPINE WITHOUT CONTRAST TECHNIQUE: Multidetector CT imaging of the head and cervical spine was performed following the standard protocol without intravenous contrast. Multiplanar CT image reconstructions of the cervical spine were also generated. COMPARISON:  MRI brain dated May 08, 2019. MR cervical spine dated Nov 30, 2014. CT head dated August 06, 2012. FINDINGS: CT HEAD FINDINGS Brain: No evidence of acute infarction, hemorrhage, hydrocephalus, extra-axial collection or mass lesion/mass effect. Stable moderate atrophy and chronic microvascular ischemic changes. 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 Vascular: No hyperdense vessel or unexpected calcification. Skull: Normal. Negative for fracture or focal lesion. Sinuses/Orbits: No acute finding. Other: Small frontal scalp hematomas, greater on the left. CT CERVICAL SPINE FINDINGS Alignment: Normal. Skull base and vertebrae: No acute fracture. Multiple scattered subcentimeter lucent lesions in the visualized cervical and upper thoracic vertebral bodies, the largest at C6 measuring 6 mm (series 11, image 31). Soft tissues and spinal canal: No prevertebral fluid or swelling. No visible canal hematoma. Disc levels: Mild multilevel disc height loss and endplate spurring from Z1-I4 through C6-C7. Severe uncovertebral hypertrophy at C6-C7. Upper chest: Scarring and bullous emphysematous changes in the right upper lobe. Confluent ground-glass density in the left lung apex. Other: None. IMPRESSION: 1. No acute intracranial abnormality. Small frontal scalp  hematomas, greater on the left. 2. No acute cervical spine fracture or traumatic listhesis. 3. Multiple scattered subcentimeter lucent lesions in the visualized cervical and upper thoracic vertebral bodies. These were not apparent on  prior MRI cervical spine from 2016. Differential diagnosis includes multiple myeloma and metastatic disease. Appropriate oncologic workup is recommended. 4. Confluent ground-glass density in the left lung apex. This could represent pneumonia or aspiration. Consider further evaluation with chest CT. Electronically Signed   By: Titus Dubin M.D.   On: 07/12/2021 18:23     Medical Consultants:   None.   Subjective:    Dale Edwards awake having a little bit of shaking.  Objective:    Vitals:   07/13/21 1654 07/13/21 1953 07/14/21 0100 07/14/21 0838  BP: 116/69 113/67 120/67 106/63  Pulse: (!) 102 (!) 103 100 91  Resp: _0 Temp: 98.5 F (36.9 C) 98.1 F (36.7 C) 98.2 F (36.8 C) 98.6 F (37 C)  TempSrc: Oral Oral Oral Oral  SpO2: 94% 95% 96% 91%  Weight:       SpO2: 91 % O2 Flow Rate (L/min): 3 L/min   Intake/Output Summary (Last 24 hours) at 07/14/2021 0920 Last data filed at 07/14/2021 0500 Gross per 24 hour  Intake 297 ml  Output 600 ml  Net -303 ml    Filed Weights   07/13/21 0744  Weight: 72.6 kg    Exam: General exam: In no acute distress. Respiratory system: Good air movement and clear to auscultation. Cardiovascular system: S1 & S2 heard, RRR. No JVD. Gastrointestinal system: Abdomen is nondistended, soft and nontender.  Extremities: No pedal edema. Skin: No rashes, lesions or ulcers Psychiatry: Judgement and insight appear normal. Mood & affect appropriate. Data Reviewed:    Labs: Basic Metabolic Panel: Recent Labs  Lab 07/09/21 2242 07/12/21 1648 07/12/21 1725 07/13/21 1645 07/14/21 0235  NA 136 137 136   136 134* 135  K 4.3 4.4 4.3   4.4 4.0 3.6  CL 101 104 106 105 104  CO2 27 14*  --  21* 22   GLUCOSE 94 78 77 123* 99  BUN 19 22 25* 18 17  CREATININE 1.07 0.86 0.80 1.08 0.83  CALCIUM 9.0 7.9*  --  8.1* 8.0*  MG  --  1.9  --  1.7  --   PHOS  --   --   --  2.0*  --     GFR Estimated Creatinine Clearance: 82.2 mL/min (by C-G formula based on SCr of 0.83 mg/dL). Liver Function Tests: Recent Labs  Lab 07/09/21 2242 07/12/21 1648 07/13/21 1645  AST 38 89* 73*  ALT 25 51* 47*  ALKPHOS 92 82 65  BILITOT 3.5* 1.6* 1.8*  PROT 6.6 5.9* 5.2*  ALBUMIN 4.0 3.0* 2.6*    No results for input(s): LIPASE, AMYLASE in the last 168 hours. Recent Labs  Lab 07/12/21 1648  AMMONIA 48*    Coagulation profile Recent Labs  Lab 07/09/21 2242 07/12/21 1648  INR 1.0 1.1    COVID-19 Labs  No results for input(s): DDIMER, FERRITIN, LDH, CRP in the last 72 hours.  Lab Results  Component Value Date   [place] NEGATIVE 07/12/2021    CBC: Recent Labs  Lab 07/09/21 2242 07/12/21 1648 07/12/21 1725 07/13/21 1645 07/14/21 0235  WBC 13.5* 18.2*  --  11.1* 7.8  NEUTROABS 11.0* 16.6*  --   --  5.8  HGB 17.5* 16.4 16.3   16.7 14.7 13.9  HCT 52.5* 49.1 48.0   49.0 42.5 39.7  MCV 94.8 96.8  --  92.4 93.2  PLT 244 184  --  169 148*    Cardiac Enzymes: Recent Labs  Lab 07/12/21 1648  07/14/21 0235  CKTOTAL 1,200* 681*    BNP (last 3 results) No results for input(s): PROBNP in the last 8760 hours. CBG: Recent Labs  Lab 07/12/21 2125 07/12/21 2147 07/12/21 2227  GLUCAP 58* 64* 116*    D-Dimer: No results for input(s): DDIMER in the last 72 hours. Hgb A1c: No results for input(s): HGBA1C in the last 72 hours. Lipid Profile: No results for input(s): CHOL, HDL, LDLCALC, TRIG, CHOLHDL, LDLDIRECT in the last 72 hours. Thyroid function studies: No results for input(s): TSH, T4TOTAL, T3FREE, THYROIDAB in the last 72 hours.  Invalid input(s): FREET3 Anemia work up: No results for input(s): VITAMINB12, FOLATE, FERRITIN, TIBC, IRON, RETICCTPCT in the last 72  hours. Sepsis Labs: Recent Labs  Lab 07/09/21 2242 07/12/21 1648 07/13/21 1645 07/14/21 0235  WBC 13.5* 18.2* 11.1* 7.8  LATICACIDVEN  --  5.3* 1.3  --     Microbiology Recent Results (from the past 240 hour(s))  Resp Panel by RT-PCR (Flu A&B, Covid) Nasopharyngeal Swab     Status: None   Collection Time: 07/12/21  5:07 PM   Specimen: Nasopharyngeal Swab; Nasopharyngeal(NP) swabs in vial transport medium  Result Value Ref Range Status   SARS Coronavirus 2 by RT PCR NEGATIVE NEGATIVE Final    Comment: (NOTE) SARS-CoV-2 target nucleic acids are NOT DETECTED.  The SARS-CoV-2 RNA is generally detectable in upper respiratory specimens during the acute phase of infection. The lowest concentration of SARS-CoV-2 viral copies this assay can detect is 138 copies/mL. A negative result does not preclude SARS-Cov-2 infection and should not be used as the sole basis for treatment or other patient management decisions. A negative result may occur with  improper specimen collection/handling, submission of specimen other than nasopharyngeal swab, presence of viral mutation(s) within the areas targeted by this assay, and inadequate number of viral copies(<138 copies/mL). A negative result must be combined with clinical observations, patient history, and epidemiological information. The expected result is Negative.  Fact Sheet for Patients:  EntrepreneurPulse.com.au  Fact Sheet for Healthcare Providers:  IncredibleEmployment.be  This test is no t yet approved or cleared by the [place] FDA and  has been authorized for detection and/or diagnosis of SARS-CoV-2 by FDA under an Emergency Use Authorization (EUA). This EUA will remain  in effect (meaning this test can be used) for the duration of the COVID-19 declaration under Section 564(b)(1) of the Act, 21 U.S.C.section 360bbb-3(b)(1), unless the authorization is terminated  or revoked sooner.        Influenza A by PCR NEGATIVE NEGATIVE Final   Influenza B by PCR NEGATIVE NEGATIVE Final    Comment: (NOTE) The Xpert Xpress SARS-CoV-2/FLU/RSV plus assay is intended as an aid in the diagnosis of influenza from Nasopharyngeal swab specimens and should not be used as a sole basis for treatment. Nasal washings and aspirates are unacceptable for Xpert Xpress SARS-CoV-2/FLU/RSV testing.  Fact Sheet for Patients: EntrepreneurPulse.com.au  Fact Sheet for Healthcare Providers: IncredibleEmployment.be  This test is not yet approved or cleared by the [place] FDA and has been authorized for detection and/or diagnosis of SARS-CoV-2 by FDA under an Emergency Use Authorization (EUA). This EUA will remain in effect (meaning this test can be used) for the duration of the COVID-19 declaration under Section 564(b)(1) of the Act, 21 U.S.C. section 360bbb-3(b)(1), unless the authorization is terminated or revoked.  Performed at [facility], [place] [address], [place], [place] [address]   Blood culture (routine x 2)     Status: None (  Preliminary result)   Collection Time: 07/12/21  6:43 PM   Specimen: BLOOD RIGHT HAND  Result Value Ref Range Status   Specimen Description BLOOD RIGHT HAND  Final   Special Requests   Final    BOTTLES DRAWN AEROBIC AND ANAEROBIC Blood Culture results may not be optimal due to an inadequate volume of blood received in culture bottles   Culture   Final    NO GROWTH < 12 HOURS Performed at [facility], [place] [address] [place], [place] [address]    Report Status PENDING  Incomplete     Medications:    enoxaparin (LOVENOX) injection  40 mg Subcutaneous P82U   folic acid  1 mg Oral Daily   folic acid  1 mg Oral Daily   multivitamin with minerals  1 tablet Oral Daily   multivitamin with minerals  1 tablet Oral Daily   nicotine  14 mg Transdermal Daily   thiamine  100 mg Oral Daily   Or   thiamine  100 mg  Intravenous Daily   thiamine  100 mg Oral Daily   Or   thiamine  100 mg Intravenous Daily   Continuous Infusions:  ampicillin-sulbactam (UNASYN) IV 1.5 g (07/14/21 0326)   lactated ringers        LOS: 2 days   Charlynne Cousins  [facility]  07/14/2021, 9:20 AM

## 2021-07-15 MED ORDER — AMOXICILLIN-POT CLAVULANATE 875-125 MG PO TABS
1.0000 | ORAL_TABLET | Freq: Two times a day (BID) | ORAL | Status: AC
  Administered 2021-07-15 – 2021-07-16 (×4): 1 via ORAL
  Filled 2021-07-15 (×4): qty 1

## 2021-07-15 NOTE — Plan of Care (Signed)

## 2021-07-15 NOTE — Progress Notes (Signed)
[facility] PROGRESS NOTE    Progress Note  Dale Edwards  LNL:[id number] DOB: 1955/08/20 DOA: 07/12/2021 PCP: Rometta Emery, MD     Brief Narrative:   Dale Edwards is an 65 y.o. male past medical history significant for alcohol abuse, tobacco abuse, homelessness who comes to the ED after being found on the ground with a laceration on the left forehead he does report consuming alcohol daily his drink of choice is whiskey with having several drinks before this episode CT of the head and neck showed no acute pathology, but it did show multiple lucencies along the cervical and thoracic body that were not present in 2016 imaging, there is some confluent groundglass density in the left lung apex was evaluated with a CT scan that showed interstitial opacity left greater than right favoring atypical infection.  He also has bullous emphysematous  Assessment/Plan:   Acute respiratory failure with hypoxia secondarily to  Aspiration pneumonia [facility]): Afebrile, leukocytosis is resolved. He continues to require 2 to 3 L to keep saturations greater 90%.  Out of bed to chair continue incentive spirometry. Change antibiotics to oral Augmentin. He relates he uses about 2 L of oxygen at home so that is probably his baseline. Physical therapy evaluated the patient, Will need skilled nursing facility, we will contact the TOC.  Rhabdomyolysis: The setting of a fall unknown downtime. Resolved with IV fluid hydration KVO IV fluids.  Alcoholic ketosis: Resolved  Prolonged QTC: Check a mag and a phosphorus, try to keep magnesium greater than 2 phosphorus greater than 2 potassium greater than 4  Generalized weakness: He stated he has had multiple falls the worst one was last night. In the setting of alcohol consumption. Physical therapy saw the patient will need skilled nursing facility.  Alcohol use: Start him on thiamine and folate. Monitor with CIWA protocol check magnesium and  phosphorus.  Abnormal lucencies of the C-spine: PSA is pending.  Traumatic hematoma of the head: Secondary to fall laceration repaired by the ED physician.  Tobacco abuse: Nicotine patch.  Homelessness: Financial controller.   DVT prophylaxis: lovenox Family Communication:none Status is: Inpatient  Remains inpatient appropriate because:    Code Status:     IV Access:   Peripheral IV   Procedures and diagnostic studies:   No results found.   Medical Consultants:   None.   Subjective:    Dale Edwards relates his symptoms are improved this morning.    Objective:    Vitals:   07/14/21 0100 07/14/21 0838 07/14/21 1438 07/14/21 1916  BP: 120/67 106/63 116/62 110/60  Pulse: 100 91 98 (!) 104  Resp: 18 17 18 17   Temp: 98.2 F (36.8 C) 98.6 F (37 C) 97.8 F (36.6 C) 98.1 F (36.7 C)  TempSrc: Oral Oral Oral   SpO2: 96% 91% 90% 92%  Weight:       SpO2: 92 % O2 Flow Rate (L/min): 3 L/min   Intake/Output Summary (Last 24 hours) at 07/15/2021 0828 Last data filed at 07/15/2021 0300 Gross per 24 hour  Intake --  Output 200 ml  Net -200 ml    Filed Weights   07/13/21 0744  Weight: 72.6 kg    Exam: General exam: In no acute distress. Respiratory system: Good air movement and clear to auscultation. Cardiovascular system: S1 & S2 heard, RRR. No JVD. Gastrointestinal system: Abdomen is nondistended, soft and nontender.  Extremities: No pedal edema. Skin: No rashes, lesions or ulcers Psychiatry: Judgement and insight appear normal. Mood &  affect appropriate. Data Reviewed:    Labs: Basic Metabolic Panel: Recent Labs  Lab 07/09/21 2242 07/12/21 1648 07/12/21 1725 07/13/21 1645 07/14/21 0235  NA 136 137 136   136 134* 135  K 4.3 4.4 4.3   4.4 4.0 3.6  CL 101 104 106 105 104  CO2 27 14*  --  21* 22  GLUCOSE 94 78 77 123* 99  BUN 19 22 25* 18 17  CREATININE 1.07 0.86 0.80 1.08 0.83  CALCIUM 9.0 7.9*  --  8.1* 8.0*  MG  --  1.9   --  1.7  --   PHOS  --   --   --  2.0*  --     GFR Estimated Creatinine Clearance: 82.2 mL/min (by C-G formula based on SCr of 0.83 mg/dL). Liver Function Tests: Recent Labs  Lab 07/09/21 2242 07/12/21 1648 07/13/21 1645  AST 38 89* 73*  ALT 25 51* 47*  ALKPHOS 92 82 65  BILITOT 3.5* 1.6* 1.8*  PROT 6.6 5.9* 5.2*  ALBUMIN 4.0 3.0* 2.6*    No results for input(s): LIPASE, AMYLASE in the last 168 hours. Recent Labs  Lab 07/12/21 1648  AMMONIA 48*    Coagulation profile Recent Labs  Lab 07/09/21 2242 07/12/21 1648  INR 1.0 1.1    COVID-19 Labs  No results for input(s): DDIMER, FERRITIN, LDH, CRP in the last 72 hours.  Lab Results  Component Value Date   [place] NEGATIVE 07/12/2021    CBC: Recent Labs  Lab 07/09/21 2242 07/12/21 1648 07/12/21 1725 07/13/21 1645 07/14/21 0235  WBC 13.5* 18.2*  --  11.1* 7.8  NEUTROABS 11.0* 16.6*  --   --  5.8  HGB 17.5* 16.4 16.3   16.7 14.7 13.9  HCT 52.5* 49.1 48.0   49.0 42.5 39.7  MCV 94.8 96.8  --  92.4 93.2  PLT 244 184  --  169 148*    Cardiac Enzymes: Recent Labs  Lab 07/12/21 1648 07/14/21 0235  CKTOTAL 1,200* 681*    BNP (last 3 results) No results for input(s): PROBNP in the last 8760 hours. CBG: Recent Labs  Lab 07/12/21 2125 07/12/21 2147 07/12/21 2227  GLUCAP 58* 64* 116*    D-Dimer: No results for input(s): DDIMER in the last 72 hours. Hgb A1c: No results for input(s): HGBA1C in the last 72 hours. Lipid Profile: No results for input(s): CHOL, HDL, LDLCALC, TRIG, CHOLHDL, LDLDIRECT in the last 72 hours. Thyroid function studies: No results for input(s): TSH, T4TOTAL, T3FREE, THYROIDAB in the last 72 hours.  Invalid input(s): FREET3 Anemia work up: No results for input(s): VITAMINB12, FOLATE, FERRITIN, TIBC, IRON, RETICCTPCT in the last 72 hours. Sepsis Labs: Recent Labs  Lab 07/09/21 2242 07/12/21 1648 07/13/21 1645 07/14/21 0235  WBC 13.5* 18.2* 11.1* 7.8  LATICACIDVEN   --  5.3* 1.3  --     Microbiology Recent Results (from the past 240 hour(s))  Resp Panel by RT-PCR (Flu A&B, Covid) Nasopharyngeal Swab     Status: None   Collection Time: 07/12/21  5:07 PM   Specimen: Nasopharyngeal Swab; Nasopharyngeal(NP) swabs in vial transport medium  Result Value Ref Range Status   SARS Coronavirus 2 by RT PCR NEGATIVE NEGATIVE Final    Comment: (NOTE) SARS-CoV-2 target nucleic acids are NOT DETECTED.  The SARS-CoV-2 RNA is generally detectable in upper respiratory specimens during the acute phase of infection. The lowest concentration of SARS-CoV-2 viral copies this assay can detect is 138 copies/mL. A negative result does  not preclude SARS-Cov-2 infection and should not be used as the sole basis for treatment or other patient management decisions. A negative result may occur with  improper specimen collection/handling, submission of specimen other than nasopharyngeal swab, presence of viral mutation(s) within the areas targeted by this assay, and inadequate number of viral copies(<138 copies/mL). A negative result must be combined with clinical observations, patient history, and epidemiological information. The expected result is Negative.  Fact Sheet for Patients:  EntrepreneurPulse.com.au  Fact Sheet for Healthcare Providers:  IncredibleEmployment.be  This test is no t yet approved or cleared by the [place] FDA and  has been authorized for detection and/or diagnosis of SARS-CoV-2 by FDA under an Emergency Use Authorization (EUA). This EUA will remain  in effect (meaning this test can be used) for the duration of the COVID-19 declaration under Section 564(b)(1) of the Act, 21 U.S.C.section 360bbb-3(b)(1), unless the authorization is terminated  or revoked sooner.       Influenza A by PCR NEGATIVE NEGATIVE Final   Influenza B by PCR NEGATIVE NEGATIVE Final    Comment: (NOTE) The Xpert Xpress  SARS-CoV-2/FLU/RSV plus assay is intended as an aid in the diagnosis of influenza from Nasopharyngeal swab specimens and should not be used as a sole basis for treatment. Nasal washings and aspirates are unacceptable for Xpert Xpress SARS-CoV-2/FLU/RSV testing.  Fact Sheet for Patients: EntrepreneurPulse.com.au  Fact Sheet for Healthcare Providers: IncredibleEmployment.be  This test is not yet approved or cleared by the [place] FDA and has been authorized for detection and/or diagnosis of SARS-CoV-2 by FDA under an Emergency Use Authorization (EUA). This EUA will remain in effect (meaning this test can be used) for the duration of the COVID-19 declaration under Section 564(b)(1) of the Act, 21 U.S.C. section 360bbb-3(b)(1), unless the authorization is terminated or revoked.  Performed at [facility], [place] [address] [place], [place] [address]   Blood culture (routine x 2)     Status: None (Preliminary result)   Collection Time: 07/12/21  6:43 PM   Specimen: BLOOD RIGHT HAND  Result Value Ref Range Status   Specimen Description BLOOD RIGHT HAND  Final   Special Requests   Final    BOTTLES DRAWN AEROBIC AND ANAEROBIC Blood Culture results may not be optimal due to an inadequate volume of blood received in culture bottles   Culture   Final    NO GROWTH 2 DAYS Performed at [facility], [place] [address] [place], [place] [address]    Report Status PENDING  Incomplete     Medications:    enoxaparin (LOVENOX) injection  40 mg Subcutaneous [id number]   folic acid  1 mg Oral Daily   metoprolol tartrate  12.5 mg Oral BID   multivitamin with minerals  1 tablet Oral Daily   multivitamin with minerals  1 tablet Oral Daily   nicotine  14 mg Transdermal Daily   thiamine  100 mg Oral Daily   Or   thiamine  100 mg Intravenous Daily   thiamine  100 mg Oral Daily   Or   thiamine  100 mg Intravenous Daily   Continuous Infusions:   ampicillin-sulbactam (UNASYN) IV 1.5 g (07/15/21 0304)      LOS: 3 days   Charlynne Cousins  [facility]  07/15/2021, 8:28 AM

## 2021-07-16 LAB — CBC WITH DIFFERENTIAL/PLATELET
Abs Immature Granulocytes: 0.06 10*3/uL (ref 0.00–0.07)
Basophils Absolute: 0.1 10*3/uL (ref 0.0–0.1)
Basophils Relative: 1 %
Eosinophils Absolute: 0.3 10*3/uL (ref 0.0–0.5)
Eosinophils Relative: 5 %
HCT: 40.7 % (ref 39.0–52.0)
Hemoglobin: 14.1 g/dL (ref 13.0–17.0)
Immature Granulocytes: 1 %
Lymphocytes Relative: 16 %
Lymphs Abs: 1.2 10*3/uL (ref 0.7–4.0)
MCH: 32.7 pg (ref 26.0–34.0)
MCHC: 34.6 g/dL (ref 30.0–36.0)
MCV: 94.4 fL (ref 80.0–100.0)
Monocytes Absolute: 0.9 10*3/uL (ref 0.1–1.0)
Monocytes Relative: 13 %
Neutro Abs: 4.8 10*3/uL (ref 1.7–7.7)
Neutrophils Relative %: 64 %
Platelets: 170 10*3/uL (ref 150–400)
RBC: 4.31 MIL/uL (ref 4.22–5.81)
RDW: 13.3 % (ref 11.5–15.5)
WBC: 7.3 10*3/uL (ref 4.0–10.5)
nRBC: 0 % (ref 0.0–0.2)

## 2021-07-16 MED ORDER — LORAZEPAM 2 MG/ML IJ SOLN
0.2500 mg | Freq: Once | INTRAMUSCULAR | Status: AC
  Administered 2021-07-16: 0.25 mg via INTRAVENOUS
  Filled 2021-07-16: qty 1

## 2021-07-16 NOTE — Progress Notes (Addendum)
[facility] PROGRESS NOTE    Progress Note  Dale Edwards  OYD:[id number] DOB: 31-Jan-1956 DOA: 07/12/2021 PCP: Rometta Emery, MD     Brief Narrative:   Dale Edwards is an 65 y.o. male past medical history significant for alcohol abuse, tobacco abuse, homelessness who comes to the ED after being found on the ground with a laceration on the left forehead he does report consuming alcohol daily his drink of choice is whiskey with having several drinks before this episode CT of the head and neck showed no acute pathology, but it did show multiple lucencies along the cervical and thoracic body that were not present in 2016 imaging, there is some confluent groundglass density in the left lung apex was evaluated with a CT scan that showed interstitial opacity left greater than right favoring atypical infection.  He also has bullous emphysematous  Assessment/Plan:   Acute respiratory failure with hypoxia secondarily to  Aspiration pneumonia [facility]): He spiked a temperature yesterday in the afternoon. T-max of 100.2.  CBC with differential showed on the left shift no leukocytosis. Continue Augmentin. Check blood cultures He relates he uses about 2 L of oxygen at home so that is probably his baseline. Physical therapy evaluated the patient, Will need skilled nursing facility, we will contact the TOC. Continue to monitor fever curve for an additional 24 hours.  Rhabdomyolysis: The setting of a fall unknown downtime. Resolved with IV fluid hydration KVO IV fluids.  Alcoholic ketosis: Resolved  Prolonged QTC: Check a mag and a phosphorus, try to keep magnesium greater than 2 phosphorus greater than 2 potassium greater than 4  Generalized weakness: He stated he has had multiple falls the worst one was last night. In the setting of alcohol consumption. Physical therapy saw the patient will need skilled nursing facility.  Alcohol use: Start him on thiamine and folate. Monitor  with CIWA protocol check magnesium and phosphorus.  Abnormal lucencies of the C-spine: Follow up with PCP as an outpatient  Traumatic hematoma of the head: Secondary to fall laceration repaired by the ED physician.  Tobacco abuse: Nicotine patch.  Homelessness: Financial controller.   DVT prophylaxis: lovenox Family Communication:none Status is: Inpatient  Remains inpatient appropriate because:    Code Status:     IV Access:   Peripheral IV   Procedures and diagnostic studies:   No results found.   Medical Consultants:   None.   Subjective:    Dale Edwards relates he feels better but he cannot be discharged he has nowhere to go.  Objective:    Vitals:   07/14/21 1916 07/15/21 0833 07/15/21 1158 07/15/21 2015  BP: 110/60 104/62 (!) 106/58 118/70  Pulse: (!) 104 79 84 (!) 102  Resp: 17 18 17 19   Temp: 98.1 F (36.7 C) 98.1 F (36.7 C)  100.2 F (37.9 C)  TempSrc:  Oral    SpO2: 92% 92% 91% 95%  Weight:       SpO2: 95 % O2 Flow Rate (L/min): 2 L/min   Intake/Output Summary (Last 24 hours) at 07/16/2021 1012 Last data filed at 07/16/2021 0648 Gross per 24 hour  Intake 240 ml  Output 700 ml  Net -460 ml    Filed Weights   07/13/21 0744  Weight: 72.6 kg    Exam: General exam: In no acute distress. Respiratory system: Good air movement and clear to auscultation. Cardiovascular system: S1 & S2 heard, RRR. No JVD. Gastrointestinal system: Abdomen is nondistended, soft and nontender.  Extremities: No  pedal edema. Skin: No rashes, lesions or ulcers Psychiatry: Judgement and insight appear normal. Mood & affect appropriate. Data Reviewed:    Labs: Basic Metabolic Panel: Recent Labs  Lab 07/09/21 2242 07/12/21 1648 07/12/21 1725 07/13/21 1645 07/14/21 0235  NA 136 137 136   136 134* 135  K 4.3 4.4 4.3   4.4 4.0 3.6  CL 101 104 106 105 104  CO2 27 14*  --  21* 22  GLUCOSE 94 78 77 123* 99  BUN 19 22 25* 18 17  CREATININE  1.07 0.86 0.80 1.08 0.83  CALCIUM 9.0 7.9*  --  8.1* 8.0*  MG  --  1.9  --  1.7  --   PHOS  --   --   --  2.0*  --     GFR Estimated Creatinine Clearance: 82.2 mL/min (by C-G formula based on SCr of 0.83 mg/dL). Liver Function Tests: Recent Labs  Lab 07/09/21 2242 07/12/21 1648 07/13/21 1645  AST 38 89* 73*  ALT 25 51* 47*  ALKPHOS 92 82 65  BILITOT 3.5* 1.6* 1.8*  PROT 6.6 5.9* 5.2*  ALBUMIN 4.0 3.0* 2.6*    No results for input(s): LIPASE, AMYLASE in the last 168 hours. Recent Labs  Lab 07/12/21 1648  AMMONIA 48*    Coagulation profile Recent Labs  Lab 07/09/21 2242 07/12/21 1648  INR 1.0 1.1    COVID-19 Labs  No results for input(s): DDIMER, FERRITIN, LDH, CRP in the last 72 hours.  Lab Results  Component Value Date   [place] NEGATIVE 07/12/2021    CBC: Recent Labs  Lab 07/09/21 2242 07/12/21 1648 07/12/21 1725 07/13/21 1645 07/14/21 0235  WBC 13.5* 18.2*  --  11.1* 7.8  NEUTROABS 11.0* 16.6*  --   --  5.8  HGB 17.5* 16.4 16.3   16.7 14.7 13.9  HCT 52.5* 49.1 48.0   49.0 42.5 39.7  MCV 94.8 96.8  --  92.4 93.2  PLT 244 184  --  169 148*    Cardiac Enzymes: Recent Labs  Lab 07/12/21 1648 07/14/21 0235  CKTOTAL 1,200* 681*    BNP (last 3 results) No results for input(s): PROBNP in the last 8760 hours. CBG: Recent Labs  Lab 07/12/21 2125 07/12/21 2147 07/12/21 2227  GLUCAP 58* 64* 116*    D-Dimer: No results for input(s): DDIMER in the last 72 hours. Hgb A1c: No results for input(s): HGBA1C in the last 72 hours. Lipid Profile: No results for input(s): CHOL, HDL, LDLCALC, TRIG, CHOLHDL, LDLDIRECT in the last 72 hours. Thyroid function studies: No results for input(s): TSH, T4TOTAL, T3FREE, THYROIDAB in the last 72 hours.  Invalid input(s): FREET3 Anemia work up: No results for input(s): VITAMINB12, FOLATE, FERRITIN, TIBC, IRON, RETICCTPCT in the last 72 hours. Sepsis Labs: Recent Labs  Lab 07/09/21 2242 07/12/21 1648  07/13/21 1645 07/14/21 0235  WBC 13.5* 18.2* 11.1* 7.8  LATICACIDVEN  --  5.3* 1.3  --     Microbiology Recent Results (from the past 240 hour(s))  Resp Panel by RT-PCR (Flu A&B, Covid) Nasopharyngeal Swab     Status: None   Collection Time: 07/12/21  5:07 PM   Specimen: Nasopharyngeal Swab; Nasopharyngeal(NP) swabs in vial transport medium  Result Value Ref Range Status   SARS Coronavirus 2 by RT PCR NEGATIVE NEGATIVE Final    Comment: (NOTE) SARS-CoV-2 target nucleic acids are NOT DETECTED.  The SARS-CoV-2 RNA is generally detectable in upper respiratory specimens during the acute phase of infection. The lowest  concentration of SARS-CoV-2 viral copies this assay can detect is 138 copies/mL. A negative result does not preclude SARS-Cov-2 infection and should not be used as the sole basis for treatment or other patient management decisions. A negative result may occur with  improper specimen collection/handling, submission of specimen other than nasopharyngeal swab, presence of viral mutation(s) within the areas targeted by this assay, and inadequate number of viral copies(<138 copies/mL). A negative result must be combined with clinical observations, patient history, and epidemiological information. The expected result is Negative.  Fact Sheet for Patients:  EntrepreneurPulse.com.au  Fact Sheet for Healthcare Providers:  IncredibleEmployment.be  This test is no t yet approved or cleared by the [place] FDA and  has been authorized for detection and/or diagnosis of SARS-CoV-2 by FDA under an Emergency Use Authorization (EUA). This EUA will remain  in effect (meaning this test can be used) for the duration of the COVID-19 declaration under Section 564(b)(1) of the Act, 21 U.S.C.section 360bbb-3(b)(1), unless the authorization is terminated  or revoked sooner.       Influenza A by PCR NEGATIVE NEGATIVE Final   Influenza B by PCR  NEGATIVE NEGATIVE Final    Comment: (NOTE) The Xpert Xpress SARS-CoV-2/FLU/RSV plus assay is intended as an aid in the diagnosis of influenza from Nasopharyngeal swab specimens and should not be used as a sole basis for treatment. Nasal washings and aspirates are unacceptable for Xpert Xpress SARS-CoV-2/FLU/RSV testing.  Fact Sheet for Patients: EntrepreneurPulse.com.au  Fact Sheet for Healthcare Providers: IncredibleEmployment.be  This test is not yet approved or cleared by the [place] FDA and has been authorized for detection and/or diagnosis of SARS-CoV-2 by FDA under an Emergency Use Authorization (EUA). This EUA will remain in effect (meaning this test can be used) for the duration of the COVID-19 declaration under Section 564(b)(1) of the Act, 21 U.S.C. section 360bbb-3(b)(1), unless the authorization is terminated or revoked.  Performed at [facility], [place] [address], [place], [place] [address]   Blood culture (routine x 2)     Status: None (Preliminary result)   Collection Time: 07/12/21  6:43 PM   Specimen: BLOOD RIGHT HAND  Result Value Ref Range Status   Specimen Description BLOOD RIGHT HAND  Final   Special Requests   Final    BOTTLES DRAWN AEROBIC AND ANAEROBIC Blood Culture results may not be optimal due to an inadequate volume of blood received in culture bottles   Culture   Final    NO GROWTH 4 DAYS Performed at [facility], [place] [address], [place], [place] [address]    Report Status PENDING  Incomplete     Medications:    amoxicillin-clavulanate  1 tablet Oral Q12H   enoxaparin (LOVENOX) injection  40 mg Subcutaneous A999333   folic acid  1 mg Oral Daily   metoprolol tartrate  12.5 mg Oral BID   multivitamin with minerals  1 tablet Oral Daily   multivitamin with minerals  1 tablet Oral Daily   nicotine  14 mg Transdermal Daily   thiamine  100 mg Oral Daily   Or   thiamine  100 mg Intravenous Daily    thiamine  100 mg Oral Daily   Or   thiamine  100 mg Intravenous Daily   Continuous Infusions:      LOS: 4 days   Charlynne Cousins  [facility]  07/16/2021, 10:12 AM

## 2021-07-16 NOTE — Care Management Important Message (Signed)
Important Message  Patient Details  Name: Dale Edwards MRN: [id number] Date of Birth: 09-06-1955   Medicare Important Message Given:  Yes     Sherilyn Banker 07/16/2021, 12:10 PM

## 2021-07-16 NOTE — TOC Progression Note (Signed)
Transition of Care [facility]) - Progression Note    Patient Details  Name: Dale Edwards MRN: [id number] Date of Birth: 16-Dec-1955  Transition of Care [facility] CM/SW Contact  Erin Sons, [place] Phone Number: 07/16/2021, 4:14 PM  Clinical Narrative:     Pt currently has no SNF bed offers.  [facility] is reviewing pt. [facility] Axon is reviewing pt.   Expected Discharge Plan: Skilled Nursing Facility Barriers to Discharge: Homeless with medical needs, No SNF bed  Expected Discharge Plan and Services Expected Discharge Plan: Skilled Nursing Facility     Post Acute Care Choice: Skilled Nursing Facility Living arrangements for the past 2 months: Homeless                                       Social Determinants of Health (SDOH) Interventions    Readmission Risk Interventions No flowsheet data found.

## 2021-07-16 NOTE — [place] FL2 (Signed)
[place] MEDICAID FL2 LEVEL OF CARE SCREENING TOOL     IDENTIFICATION  Patient Name: Dale Edwards Birthdate: 03-31-1956 Sex: male Admission Date (Current Location): 07/12/2021  [facility] and [place] Number:  Producer, television/film/video and Address:  [facility] [place], [address] [place], [place] [address]      Provider Number: [id number]  Attending Physician Name and Address:  Marinda Elk, MD  Relative Name and Phone Number:       Current Level of Care: Hospital Recommended Level of Care: Skilled Nursing Facility Prior Approval Number:    Date Approved/Denied:   PASRR Number: [id number]  Discharge Plan: SNF    Current Diagnoses: Patient Active Problem List   Diagnosis Date Noted   Aspiration pneumonia (HCC) 07/12/2021   Generalized weakness 07/12/2021   Rhabdomyolysis 07/12/2021   Traumatic hematoma of head 07/12/2021   Abnormal finding on lung imaging 07/12/2021   Abnormal CT scan, neck 07/12/2021   Tobacco abuse 07/12/2021   Alcohol abuse 07/12/2021   Homeless 07/12/2021   COPD (chronic obstructive pulmonary disease) (HCC) 07/12/2021   Prolonged QT interval 07/12/2021   Relapsing remitting multiple sclerosis (HCC) 10/12/2019   Tremor of right hand 10/12/2019   Gait abnormality 10/12/2019   Tremor 10/30/2017   Vitamin D deficiency 04/10/2017   Lumbar radiculopathy 11/15/2014   Low back pain 08/01/2014   Multiple sclerosis (HCC) 02/09/2013   Abnormality of gait 01/08/2013   Right sided weakness 01/08/2013   EMPHYSEMA 02/17/2009   DYSPNEA 02/17/2009   Nonspecific (abnormal) findings on radiological and other examination of body structure 02/17/2009   CT, CHEST, ABNORMAL 02/17/2009    Orientation RESPIRATION BLADDER Height & Weight     Self, Time, Situation, Place  O2 (2L [place]) Incontinent Weight: 160 lb 0.9 oz (72.6 kg) Height:     BEHAVIORAL SYMPTOMS/MOOD NEUROLOGICAL BOWEL NUTRITION STATUS      Incontinent Diet (See d/c  summary)  AMBULATORY STATUS COMMUNICATION OF NEEDS Skin   Extensive Assist Verbally Skin abrasions (non pressure wounds/abrasions on knees, shin, and face)                       Personal Care Assistance Level of Assistance  Bathing, Dressing, Feeding Bathing Assistance: Limited assistance Feeding assistance: Independent Dressing Assistance: Limited assistance     Functional Limitations Info  Sight, Hearing, Speech Sight Info: Adequate Hearing Info: Adequate Speech Info: Adequate    SPECIAL CARE FACTORS FREQUENCY  PT (By licensed PT), OT (By licensed OT)     PT Frequency: 5x/week OT Frequency: 5x/week            Contractures Contractures Info: Not present    Additional Factors Info  Code Status, Allergies Code Status Info: Full code Allergies Info: Hydrocodone-acetaminophen           Current Medications (07/16/2021):  This is the current hospital active medication list Current Facility-Administered Medications  Medication Dose Route Frequency Provider Last Rate Last Admin   acetaminophen (TYLENOL) tablet 650 mg  650 mg Oral Q6H PRN Chotiner, Claudean Severance, MD   650 mg at 07/15/21 0308   Or   acetaminophen (TYLENOL) suppository 650 mg  650 mg Rectal Q6H PRN Chotiner, Claudean Severance, MD       amoxicillin-clavulanate (AUGMENTIN) 875-125 MG per tablet 1 tablet  1 tablet Oral Q12H Marinda Elk, MD   1 tablet at 07/15/21 2243   enoxaparin (LOVENOX) injection 40 mg  40 mg Subcutaneous Q24H Chotiner, Elige Radon  S, MD   40 mg at 07/15/21 1303   folic acid (FOLVITE) tablet 1 mg  1 mg Oral Daily Chotiner, Claudean Severance, MD   1 mg at 07/15/21 9024   ipratropium-albuterol (DUONEB) 0.5-2.5 (3) MG/3ML nebulizer solution 3 mL  3 mL Nebulization Q6H PRN Chotiner, Claudean Severance, MD       LORazepam (ATIVAN) tablet 1-4 mg  1-4 mg Oral Q1H PRN Chotiner, Claudean Severance, MD   2 mg at 07/15/21 2243   Or   LORazepam (ATIVAN) injection 1-4 mg  1-4 mg Intravenous Q1H PRN Chotiner, Claudean Severance, MD        metoprolol tartrate (LOPRESSOR) tablet 12.5 mg  12.5 mg Oral BID Marinda Elk, MD   12.5 mg at 07/15/21 2243   multivitamin with minerals tablet 1 tablet  1 tablet Oral Daily Lutricia Feil, MD   1 tablet at 07/12/21 1856   multivitamin with minerals tablet 1 tablet  1 tablet Oral Daily Chotiner, Claudean Severance, MD   1 tablet at 07/15/21 0973   nicotine (NICODERM CQ - dosed in mg/24 hours) patch 14 mg  14 mg Transdermal Daily Chotiner, Claudean Severance, MD   14 mg at 07/15/21 5329   senna-docusate (Senokot-S) tablet 1 tablet  1 tablet Oral QHS PRN Chotiner, Claudean Severance, MD       thiamine tablet 100 mg  100 mg Oral Daily Lutricia Feil, MD   100 mg at 07/12/21 9242   Or   thiamine (B-1) injection 100 mg  100 mg Intravenous Daily MacPherson, Birdie Riddle, MD       thiamine tablet 100 mg  100 mg Oral Daily Chotiner, Claudean Severance, MD   100 mg at 07/15/21 6834   Or   thiamine (B-1) injection 100 mg  100 mg Intravenous Daily Chotiner, Claudean Severance, MD         Discharge Medications: Please see discharge summary for a list of discharge medications.  Relevant Imaging Results:  Relevant Lab Results:   Additional Information SSN [id number] [place], [place]

## 2021-07-16 NOTE — Progress Notes (Signed)
PT Cancellation Note  Patient Details Name: Dale Edwards MRN: [id number] DOB: August 25, 1955   Cancelled Treatment:    Reason Eval/Treat Not Completed: Patient's level of consciousness Attempted to see twice this PM however pt not able to stay awake and alert enough to converse or participate in therapy. Will follow.   Blake Divine A Mahiya Kercheval 07/16/2021, 2:28 PM Vale Haven, PT, DPT Acute Rehabilitation Services Pager [phone] Office [phone]

## 2021-07-16 NOTE — TOC Progression Note (Addendum)
Transition of Care [facility]) - Progression Note    Patient Details  Name: Dale Edwards MRN: [id number] Date of Birth: May 04, 1956  Transition of Care [facility]) CM/SW [place], [place] Phone Number: 07/16/2021, 10:06 AM  Clinical Narrative:     CSW met with pt to discuss DC plans/SNF. Pt explains he is homeless and has been staying outside for the last 2-3 weeks "wherever he can." Pt states prior to that he was staying in a house with no electricity. He explained his girlfriend died about 10 months ago and after that the house he was staying in "went to shit." It sounds like there may have been others staying there sometimes and that no one was on the lease. CSW explained [facility] homeless resource. Pt states he does not have a plan for DC. CSW explained SNF recommendation; patient is agreeable. CSW explained that homelessness and alcohol use are barriers. Pt's plan following SNF would be to go back on the street. CSW will complete FL2 and fax bed requests in the hub. Pt reports he is not covid vaccinated.   1115: Pt received electronic bed offer from [facility]. CSW called [facility] liaison to discuss pt. She explains they are actually unable to accept pt due to homelessness and not having a discharge plan following SNF stay.   Expected Discharge Plan: Skilled Nursing Facility Barriers to Discharge: Homeless with medical needs, No SNF bed  Expected Discharge Plan and Services Expected Discharge Plan: [place]: [place] arrangements for the past 2 months: Homeless                                       Social Determinants of Health (SDOH) Interventions    Readmission Risk Interventions No flowsheet data found.

## 2021-07-17 DIAGNOSIS — Z5900 Homelessness unspecified: Secondary | ICD-10-CM

## 2021-07-17 DIAGNOSIS — R918 Other nonspecific abnormal finding of lung field: Secondary | ICD-10-CM

## 2021-07-17 LAB — PSA (REFLEX TO FREE) (SERIAL): Prostate Specific Ag, Serum: 0.7 ng/mL (ref 0.0–4.0)

## 2021-07-17 LAB — CULTURE, BLOOD (ROUTINE X 2): Culture: NO GROWTH

## 2021-07-17 NOTE — TOC Progression Note (Signed)
Transition of Care [facility]) - Progression Note    Patient Details  Name: Dina Mobley MRN: [id number] Date of Birth: April 28, 1956  Transition [facility]) CM/SW Contact  Erin Sons, [place] Phone Number: 07/17/2021, 4:41 PM  Clinical Narrative:     [facility] reviewing referral; they have concerns about plans after SNF but have not yet denied pt.   CSW also faxed referral to the [facility] in [place] Fax# [phone]  Expected Discharge Plan: Skilled Nursing Facility Barriers to Discharge: Homeless with medical needs, No SNF bed  Expected Discharge Plan and Services Expected Discharge Plan: Skilled Nursing Facility     Post Acute Care Choice: Skilled Nursing Facility Living arrangements for the past 2 months: Homeless                                       Social Determinants of Health (SDOH) Interventions    Readmission Risk Interventions No flowsheet data found.

## 2021-07-17 NOTE — Progress Notes (Signed)
Physical Therapy Treatment Patient Details Name: Dale Edwards MRN: [id number] DOB: [birth year] Today's Date: 07/17/2021   History of Present Illness Pt is a 65 y/o male admitted after beeing found down. Has laceration on head that was closed in ED. Also found to have Rhabdomyolysis. PMH includes MS, COPD, alcohol abuse, tobacco abuse.    PT Comments    Patient progressing slowly towards PT goals. More alert today. Continues to have tremoring/jerking of RUE/LE worsened with movement which pt reports as baseline. Sp02 dropped to 80% on 3L/min 02 [place] with activity, titrated up to 4L and able to maintain in 90s. Pt with 2/4 DOE. Education on pursed lip breating and increasing activity/OOB to chair for all meals. Fatigues quickly. Worried about where he is going to go after rehab. Will need some clothes prior to d/c as well. Will follow.   Recommendations for follow up therapy are one component of a multi-disciplinary discharge planning process, led by the attending physician.  Recommendations may be updated based on patient status, additional functional criteria and insurance authorization.  Follow Up Recommendations  Skilled nursing-short term rehab (<3 hours/day)     Assistance Recommended at Discharge Frequent or constant Supervision/Assistance  Equipment Recommendations  Rolling walker (2 wheels)    Recommendations for Other Services       Precautions / Restrictions Precautions Precautions: Fall;Other (comment) Precaution Comments: Monitor O2 Restrictions Weight Bearing Restrictions: No     Mobility  Bed Mobility Overal bed mobility: Needs Assistance Bed Mobility: Supine to Sit     Supine to sit: Supervision;HOB elevated     General bed mobility comments: Supervision for safety, use of rail and increased time.    Transfers Overall transfer level: Needs assistance Equipment used: Rolling walker (2 wheels) Transfers: Sit to/from Stand Sit to Stand: Min assist            General transfer comment: Min A to steady in standing, tremors noted in RUE/LE worsened in standing. approximation helped with RUE on RW.    Ambulation/Gait Ambulation/Gait assistance: Min assist Gait Distance (Feet): 14 Feet Assistive device: Rolling walker (2 wheels) Gait Pattern/deviations: Step-through pattern;Decreased stride length Gait velocity: Decreased     General Gait Details: Slow, mildly unsteady gait with tremoring/jerking movements of RUE/LE (LE worse), 2/4 DOE./ Sp02 dropped to 80% on 3L 02, increased to 4L and able to stay in 90s. Fatigues   Stairs             Wheelchair Mobility    Modified Rankin (Stroke Patients Only)       Balance Overall balance assessment: Needs assistance Sitting-balance support: No upper extremity supported;Feet supported Sitting balance-Leahy Scale: Fair     Standing balance support: During functional activity Standing balance-Leahy Scale: Poor Standing balance comment: Reliant on BUE support                            Cognition Arousal/Alertness: Awake/alert Behavior During Therapy: WFL for tasks assessed/performed Overall Cognitive Status: No family/caregiver present to determine baseline cognitive functioning                                 General Comments: Decreased safety awareness noted, reports poor memory.        Exercises      General Comments General comments (skin integrity, edema, etc.): Sp02 80% on 3L/min 02 [place], Cues for pursed lip breathing, increased 02 to  4L to maintain oxygen >92%.      Pertinent Vitals/Pain Pain Assessment: Faces Faces Pain Scale: Hurts little more Pain Location: generalized Pain Descriptors / Indicators: Grimacing;Guarding;Sore Pain Intervention(s): Monitored during session;Repositioned    Home Living                          Prior Function            PT Goals (current goals can now be found in the care plan section)  Progress towards PT goals: Progressing toward goals    Frequency    Min 3X/week      PT Plan Current plan remains appropriate    Co-evaluation              AM-PAC PT "6 Clicks" Mobility   Outcome Measure  Help needed turning from your back to your side while in a flat bed without using bedrails?: A Little Help needed moving from lying on your back to sitting on the side of a flat bed without using bedrails?: A Little Help needed moving to and from a bed to a chair (including a wheelchair)?: A Little Help needed standing up from a chair using your arms (e.g., wheelchair or bedside chair)?: A Little Help needed to walk in hospital room?: A Little Help needed climbing 3-5 steps with a railing? : A Lot 6 Click Score: 17    End of Session Equipment Utilized During Treatment: Gait belt Activity Tolerance: Patient limited by fatigue Patient left: in chair;with call bell/phone within reach;with chair alarm set Nurse Communication: Mobility status PT Visit Diagnosis: Unsteadiness on feet (R26.81);Muscle weakness (generalized) (M62.81)     Time: 8850-2774 PT Time Calculation (min) (ACUTE ONLY): 33 min  Charges:  $Therapeutic Activity: 23-37 mins                     Vale Haven, PT, DPT Acute Rehabilitation Services Pager [phone] Office [phone]      Blake Divine A Lanier Ensign 07/17/2021, 11:24 AM

## 2021-07-17 NOTE — Progress Notes (Signed)
[facility] PROGRESS NOTE    Progress Note  Dale Edwards  [id number] DOB: 04/21/1956 DOA: 07/12/2021 PCP: Elwyn Reach, MD     Brief Narrative:   Dale Edwards is an 64 y.o. male past medical history significant for alcohol abuse, tobacco abuse, homelessness who comes to the ED after being found on the ground with a laceration on the left forehead he does report consuming alcohol daily his drink of choice is whiskey with having several drinks before this episode CT of the head and neck showed no acute pathology, but it did show multiple lucencies along the cervical and thoracic body that were not present in 2016 imaging, there is some confluent groundglass density in the left lung apex was evaluated with a CT scan that showed interstitial opacity left greater than right favoring atypical infection.  He also has bullous emphysematous  Assessment/Plan:   Acute respiratory failure with hypoxia secondarily to  Aspiration pneumonia [facility]): He spiked a temperature yesterday in the afternoon. Afebrile, with a T-max of 98.4 Continue Augmentin for total of 5 days. Check blood cultures He relates he uses about 2 L of oxygen at home so that is probably his baseline. Physical therapy evaluated the patient, Will need skilled nursing facility, we will contact the TOC. Has remained afebrile. Still awaiting skilled nursing facility placement.  Rhabdomyolysis: The setting of a fall unknown downtime. Resolved with IV fluid hydration KVO IV fluids.  Alcoholic ketosis: Resolved  Prolonged QTC: Check a mag and a phosphorus, try to keep magnesium greater than 2 phosphorus greater than 2 potassium greater than 4  Generalized weakness: He stated he has had multiple falls the worst one was last night. In the setting of alcohol consumption. Physical therapy saw the patient will need skilled nursing facility.  Alcohol use: Start him on thiamine and folate. Monitor with CIWA  protocol check magnesium and phosphorus.  Abnormal lucencies of the C-spine: Follow up with PCP as an outpatient  Traumatic hematoma of the head: Secondary to fall laceration repaired by the ED physician.  Tobacco abuse: Nicotine patch.  Homelessness: Adult nurse.   DVT prophylaxis: lovenox Family Communication:none Status is: Inpatient  Remains inpatient appropriate because:    Code Status:     IV Access:   Peripheral IV   Procedures and diagnostic studies:   No results found.   Medical Consultants:   None.   Subjective:    Dale Edwards no complaints today.  Objective:    Vitals:   07/16/21 1000 07/16/21 1946 07/17/21 0556 07/17/21 0750  BP: 130/77 102/66 107/71 (!) 95/53  Pulse: 82 (!) 103 85 89  Resp: 17 20 18 19   Temp: 97.8 F (36.6 C) 97.7 F (36.5 C) 97.9 F (36.6 C) 98 F (36.7 C)  TempSrc: Oral Oral Oral Oral  SpO2: 92% 91% 95% 97%  Weight:       SpO2: 97 % O2 Flow Rate (L/min): 3 L/min   Intake/Output Summary (Last 24 hours) at 07/17/2021 0931 Last data filed at 07/17/2021 0600 Gross per 24 hour  Intake --  Output 700 ml  Net -700 ml    Filed Weights   07/13/21 0744  Weight: 72.6 kg    Exam: General exam: In no acute distress. Respiratory system: Good air movement and clear to auscultation. Cardiovascular system: S1 & S2 heard, RRR. No JVD. Gastrointestinal system: Abdomen is nondistended, soft and nontender.  Extremities: No pedal edema. Skin: No rashes, lesions or ulcers Psychiatry: Judgement and insight appear normal.  Mood & affect appropriate. Data Reviewed:    Labs: Basic Metabolic Panel: Recent Labs  Lab 07/12/21 1648 07/12/21 1725 07/13/21 1645 07/14/21 0235  NA 137 136   136 134* 135  K 4.4 4.3   4.4 4.0 3.6  CL 104 106 105 104  CO2 14*  --  21* 22  GLUCOSE 78 77 123* 99  BUN 22 25* 18 17  CREATININE 0.86 0.80 1.08 0.83  CALCIUM 7.9*  --  8.1* 8.0*  MG 1.9  --  1.7  --   PHOS  --    --  2.0*  --     GFR Estimated Creatinine Clearance: 82.2 mL/min (by C-G formula based on SCr of 0.83 mg/dL). Liver Function Tests: Recent Labs  Lab 07/12/21 1648 07/13/21 1645  AST 89* 73*  ALT 51* 47*  ALKPHOS 82 65  BILITOT 1.6* 1.8*  PROT 5.9* 5.2*  ALBUMIN 3.0* 2.6*    No results for input(s): LIPASE, AMYLASE in the last 168 hours. Recent Labs  Lab 07/12/21 1648  AMMONIA 48*    Coagulation profile Recent Labs  Lab 07/12/21 1648  INR 1.1    COVID-19 Labs  No results for input(s): DDIMER, FERRITIN, LDH, CRP in the last 72 hours.  Lab Results  Component Value Date   SARSCOV2NAA NEGATIVE 07/12/2021    CBC: Recent Labs  Lab 07/12/21 1648 07/12/21 1725 07/13/21 1645 07/14/21 0235 07/16/21 1001  WBC 18.2*  --  11.1* 7.8 7.3  NEUTROABS 16.6*  --   --  5.8 4.8  HGB 16.4 16.3   16.7 14.7 13.9 14.1  HCT 49.1 48.0   49.0 42.5 39.7 40.7  MCV 96.8  --  92.4 93.2 94.4  PLT 184  --  169 148* 170    Cardiac Enzymes: Recent Labs  Lab 07/12/21 1648 07/14/21 0235  CKTOTAL 1,200* 681*    BNP (last 3 results) No results for input(s): PROBNP in the last 8760 hours. CBG: Recent Labs  Lab 07/12/21 2125 07/12/21 2147 07/12/21 2227  GLUCAP 58* 64* 116*    D-Dimer: No results for input(s): DDIMER in the last 72 hours. Hgb A1c: No results for input(s): HGBA1C in the last 72 hours. Lipid Profile: No results for input(s): CHOL, HDL, LDLCALC, TRIG, CHOLHDL, LDLDIRECT in the last 72 hours. Thyroid function studies: No results for input(s): TSH, T4TOTAL, T3FREE, THYROIDAB in the last 72 hours.  Invalid input(s): FREET3 Anemia work up: No results for input(s): VITAMINB12, FOLATE, FERRITIN, TIBC, IRON, RETICCTPCT in the last 72 hours. Sepsis Labs: Recent Labs  Lab 07/12/21 1648 07/13/21 1645 07/14/21 0235 07/16/21 1001  WBC 18.2* 11.1* 7.8 7.3  LATICACIDVEN 5.3* 1.3  --   --     Microbiology Recent Results (from the past 240 hour(s))  Resp Panel  by RT-PCR (Flu A&B, Covid) Nasopharyngeal Swab     Status: None   Collection Time: 07/12/21  5:07 PM   Specimen: Nasopharyngeal Swab; Nasopharyngeal(NP) swabs in vial transport medium  Result Value Ref Range Status   SARS Coronavirus 2 by RT PCR NEGATIVE NEGATIVE Final    Comment: (NOTE) SARS-CoV-2 target nucleic acids are NOT DETECTED.  The SARS-CoV-2 RNA is generally detectable in upper respiratory specimens during the acute phase of infection. The lowest concentration of SARS-CoV-2 viral copies this assay can detect is 138 copies/mL. A negative result does not preclude SARS-Cov-2 infection and should not be used as the sole basis for treatment or other patient management decisions. A negative result may occur  with  improper specimen collection/handling, submission of specimen other than nasopharyngeal swab, presence of viral mutation(s) within the areas targeted by this assay, and inadequate number of viral copies(<138 copies/mL). A negative result must be combined with clinical observations, patient history, and epidemiological information. The expected result is Negative.  Fact Sheet for Patients:  EntrepreneurPulse.com.au  Fact Sheet for Healthcare Providers:  IncredibleEmployment.be  This test is no t yet approved or cleared by the [place] FDA and  has been authorized for detection and/or diagnosis of SARS-CoV-2 by FDA under an Emergency Use Authorization (EUA). This EUA will remain  in effect (meaning this test can be used) for the duration of the COVID-19 declaration under Section 564(b)(1) of the Act, 21 U.S.C.section 360bbb-3(b)(1), unless the authorization is terminated  or revoked sooner.       Influenza A by PCR NEGATIVE NEGATIVE Final   Influenza B by PCR NEGATIVE NEGATIVE Final    Comment: (NOTE) The Xpert Xpress SARS-CoV-2/FLU/RSV plus assay is intended as an aid in the diagnosis of influenza from Nasopharyngeal swab  specimens and should not be used as a sole basis for treatment. Nasal washings and aspirates are unacceptable for Xpert Xpress SARS-CoV-2/FLU/RSV testing.  Fact Sheet for Patients: EntrepreneurPulse.com.au  Fact Sheet for Healthcare Providers: IncredibleEmployment.be  This test is not yet approved or cleared by the [place] FDA and has been authorized for detection and/or diagnosis of SARS-CoV-2 by FDA under an Emergency Use Authorization (EUA). This EUA will remain in effect (meaning this test can be used) for the duration of the COVID-19 declaration under Section 564(b)(1) of the Act, 21 U.S.C. section 360bbb-3(b)(1), unless the authorization is terminated or revoked.  Performed at [facility], [place] [address] [place], [place] [address]   Blood culture (routine x 2)     Status: None   Collection Time: 07/12/21  6:43 PM   Specimen: BLOOD RIGHT HAND  Result Value Ref Range Status   Specimen Description BLOOD RIGHT HAND  Final   Special Requests   Final    BOTTLES DRAWN AEROBIC AND ANAEROBIC Blood Culture results may not be optimal due to an inadequate volume of blood received in culture bottles   Culture   Final    NO GROWTH 5 DAYS Performed at [facility], [place] [address], [place], [place] [address]    Report Status 07/17/2021 FINAL  Final  Culture, blood (Routine X 2) w Reflex to ID Panel     Status: None (Preliminary result)   Collection Time: 07/16/21 10:44 AM   Specimen: BLOOD  Result Value Ref Range Status   Specimen Description BLOOD BLOOD LEFT ARM  Final   Special Requests   Final    BOTTLES DRAWN AEROBIC AND ANAEROBIC Blood Culture adequate volume   Culture   Final    NO GROWTH < 24 HOURS Performed at [facility], [place] [address] [place], [place] [address]    Report Status PENDING  Incomplete  Culture, blood (Routine X 2) w Reflex to ID Panel     Status: None (Preliminary result)   Collection Time:  07/16/21 10:53 AM   Specimen: BLOOD  Result Value Ref Range Status   Specimen Description BLOOD LEFT ANTECUBITAL  Final   Special Requests   Final    BOTTLES DRAWN AEROBIC AND ANAEROBIC Blood Culture adequate volume   Culture   Final    NO GROWTH < 24 HOURS Performed at [facility], [place] [address]  [place], [place] [address]    Report Status PENDING  Incomplete     Medications:    enoxaparin (LOVENOX) injection  40 mg Subcutaneous [id number]   folic acid  1 mg Oral Daily   metoprolol tartrate  12.5 mg Oral BID   multivitamin with minerals  1 tablet Oral Daily   multivitamin with minerals  1 tablet Oral Daily   nicotine  14 mg Transdermal Daily   thiamine  100 mg Oral Daily   Or   thiamine  100 mg Intravenous Daily   thiamine  100 mg Oral Daily   Or   thiamine  100 mg Intravenous Daily   Continuous Infusions:      LOS: 5 days   Charlynne Cousins  [facility]  07/17/2021, 9:31 AM

## 2021-07-17 NOTE — Plan of Care (Signed)
  Problem: Education: Goal: Knowledge of General Education information will improve Description Including pain rating scale, medication(s)/side effects and non-pharmacologic comfort measures Outcome: Progressing   Problem: Elimination: Goal: Will not experience complications related to urinary retention Outcome: Progressing   Problem: Safety: Goal: Ability to remain free from injury will improve Outcome: Progressing   

## 2021-07-18 DIAGNOSIS — R531 Weakness: Secondary | ICD-10-CM

## 2021-07-18 NOTE — Progress Notes (Signed)
[facility]  PROGRESS NOTE  Dale Edwards ESP:[id number] DOB: 02-10-1956 DOA: 07/12/2021 PCP: Elwyn Reach, MD   Brief HPI:   65 year old male with past medical history of alcohol abuse, tobacco abuse, homelessness came to ED after being found on the ground with laceration of the left forehead.  He was consuming alcohol daily, drink of choice is whiskey with having several drinks before this episode.  CT of the head and neck showed no acute pathology.  It did show multiple lucencies along the cervical and thoracic body that were not present in 2016 with some confluent groundglass density in the left apex.  CT scan showed interstitial opacity left more than right favoring atypical infection.  Also has bullous emphysema.    Subjective   Patient seen and examined, denies any complaints.   Assessment/Plan:     Acute hypoxemic respiratory failure -Secondary to aspiration pneumonia -Resolved -Completed IV antibiotics with Unasyn and Augmentin in the hospital -Patient uses 2 L of oxygen at home, likely at baseline -PT saw patient and recommended skilled nursing facility for rehab  Rhabdomyolysis -Resolved with IV fluids  Alcoholic ketoacidosis -Resolved  Abnormal lucencies -Noted on clavicle head, thoracic and cervical spine -Concern for multiple myeloma -We will discuss with oncology regarding further work-up in the hospital versus as outpatient  Alcohol abuse -Continue thiamine, folate -Continue CIWA protocol  Traumatic laceration of scalp -Repaired by ED provider  Homelessness -Social worker following -To go to skilled nursing facility  Prolonged QTC -Try to keep mag more than 2, potassium greater than 4, phosphorus greater than 2      Medications     enoxaparin (LOVENOX) injection  40 mg Subcutaneous Q33H   folic acid  1 mg Oral Daily   metoprolol tartrate  12.5 mg Oral BID   multivitamin with minerals  1 tablet Oral Daily   multivitamin with  minerals  1 tablet Oral Daily   nicotine  14 mg Transdermal Daily   thiamine  100 mg Oral Daily   Or   thiamine  100 mg Intravenous Daily   thiamine  100 mg Oral Daily   Or   thiamine  100 mg Intravenous Daily     Data Reviewed:   CBG:  Recent Labs  Lab 07/12/21 2125 07/12/21 2147 07/12/21 2227  GLUCAP 58* 64* 116*    SpO2: 91 % O2 Flow Rate (L/min): 4 L/min    Vitals:   07/17/21 1200 07/17/21 1949 07/18/21 0413 07/18/21 0812  BP: 106/72 105/60 111/71 116/67  Pulse: 98 92 81 77  Resp: '18 20 17 16  ' Temp: 98.4 F (36.9 C) 98 F (36.7 C) 97.8 F (36.6 C) (!) 97.5 F (36.4 C)  TempSrc: Oral Oral Oral   SpO2: 93% 92% 93% 91%  Weight:         Intake/Output Summary (Last 24 hours) at 07/18/2021 1804 Last data filed at 07/18/2021 1300 Gross per 24 hour  Intake 240 ml  Output 600 ml  Net -360 ml    12/19 1901 - 12/21 0700 In: -  Out: 1500 [Urine:1500]  Filed Weights   07/13/21 0744  Weight: 72.6 kg    Data Reviewed: Basic Metabolic Panel: Recent Labs  Lab 07/12/21 1648 07/12/21 1725 07/13/21 1645 07/14/21 0235  NA 137 136   136 134* 135  K 4.4 4.3   4.4 4.0 3.6  CL 104 106 105 104  CO2 14*  --  21* 22  GLUCOSE 78 77 123* 99  BUN 22  25* 18 17  CREATININE 0.86 0.80 1.08 0.83  CALCIUM 7.9*  --  8.1* 8.0*  MG 1.9  --  1.7  --   PHOS  --   --  2.0*  --    Liver Function Tests: Recent Labs  Lab 07/12/21 1648 07/13/21 1645  AST 89* 73*  ALT 51* 47*  ALKPHOS 82 65  BILITOT 1.6* 1.8*  PROT 5.9* 5.2*  ALBUMIN 3.0* 2.6*   No results for input(s): LIPASE, AMYLASE in the last 168 hours. Recent Labs  Lab 07/12/21 1648  AMMONIA 48*   CBC: Recent Labs  Lab 07/12/21 1648 07/12/21 1725 07/13/21 1645 07/14/21 0235 07/16/21 1001  WBC 18.2*  --  11.1* 7.8 7.3  NEUTROABS 16.6*  --   --  5.8 4.8  HGB 16.4 16.3   16.7 14.7 13.9 14.1  HCT 49.1 48.0   49.0 42.5 39.7 40.7  MCV 96.8  --  92.4 93.2 94.4  PLT 184  --  169 148* 170   Cardiac  Enzymes: Recent Labs  Lab 07/12/21 1648 07/14/21 0235  CKTOTAL 1,200* 681*   BNP (last 3 results) No results for input(s): BNP in the last 8760 hours.  ProBNP (last 3 results) No results for input(s): PROBNP in the last 8760 hours.  CBG: Recent Labs  Lab 07/12/21 2125 07/12/21 2147 07/12/21 2227  GLUCAP 80* 64* 116*       Radiology Reports  No results found.     Antibiotics: Anti-infectives (From admission, onward)    Start     Dose/Rate Route Frequency Ordered Stop   07/15/21 1000  amoxicillin-clavulanate (AUGMENTIN) 875-125 MG per tablet 1 tablet        1 tablet Oral Every 12 hours 07/15/21 0833 07/16/21 2146   07/13/21 1400  ampicillin-sulbactam (UNASYN) 1.5 g in sodium chloride 0.9 % 100 mL IVPB  Status:  Discontinued        1.5 g 200 mL/hr over 30 Minutes Intravenous Every 6 hours 07/13/21 1333 07/15/21 0833   07/13/21 1100  Ampicillin-Sulbactam (UNASYN) 3 g in sodium chloride 0.9 % 100 mL IVPB  Status:  Discontinued        3 g 200 mL/hr over 30 Minutes Intravenous Every 6 hours 07/13/21 1009 07/13/21 1339   07/13/21 1000  vancomycin (VANCOCIN) IVPB 1000 mg/200 mL premix  Status:  Discontinued        1,000 mg 200 mL/hr over 60 Minutes Intravenous Every 12 hours 07/12/21 2056 07/13/21 1010   07/13/21 0800  ampicillin-sulbactam (UNASYN) 1.5 g in sodium chloride 0.9 % 100 mL IVPB  Status:  Discontinued        1.5 g 200 mL/hr over 30 Minutes Intravenous Every 6 hours 07/13/21 0740 07/13/21 1009   07/13/21 0400  ceFEPIme (MAXIPIME) 2 g in sodium chloride 0.9 % 100 mL IVPB  Status:  Discontinued        2 g 200 mL/hr over 30 Minutes Intravenous Every 8 hours 07/12/21 2056 07/13/21 0740   07/12/21 1830  ceFEPIme (MAXIPIME) 2 g in sodium chloride 0.9 % 100 mL IVPB        2 g 200 mL/hr over 30 Minutes Intravenous  Once 07/12/21 1815 07/12/21 1954   07/12/21 1830  metroNIDAZOLE (FLAGYL) IVPB 500 mg        500 mg 100 mL/hr over 60 Minutes Intravenous  Once 07/12/21  1815 07/12/21 2030   07/12/21 1830  vancomycin (VANCOREADY) IVPB 1500 mg/300 mL  1,500 mg 150 mL/hr over 120 Minutes Intravenous  Once 07/12/21 1815 07/12/21 2251         DVT prophylaxis: Lovenox  Code Status: Full code  Family Communication: No family at bedside   Consultants:   Procedures:     Objective    Physical Examination:   General-appears in no acute distress Heart-S1-S2, regular, no murmur auscultated Lungs-clear to auscultation bilaterally, no wheezing or crackles auscultated Abdomen-soft, nontender, no organomegaly Extremities-no edema in the lower extremities Neuro-alert, oriented x3, no focal deficit noted  Status is: Inpatient  Dispo: The patient is from: Homeless              Anticipated d/c is to: Skilled nursing facility              Anticipated d/c date is: To be decided              Patient currently stable for discharge  Barrier to discharge-awaiting bed placement at skilled nursing facility  COVID-19 Labs  No results for input(s): DDIMER, FERRITIN, LDH, CRP in the last 72 hours.  Lab Results  Component Value Date   [place] NEGATIVE 07/12/2021            Recent Results (from the past 240 hour(s))  Resp Panel by RT-PCR (Flu A&B, Covid) Nasopharyngeal Swab     Status: None   Collection Time: 07/12/21  5:07 PM   Specimen: Nasopharyngeal Swab; Nasopharyngeal(NP) swabs in vial transport medium  Result Value Ref Range Status   SARS Coronavirus 2 by RT PCR NEGATIVE NEGATIVE Final    Comment: (NOTE) SARS-CoV-2 target nucleic acids are NOT DETECTED.  The SARS-CoV-2 RNA is generally detectable in upper respiratory specimens during the acute phase of infection. The lowest concentration of SARS-CoV-2 viral copies this assay can detect is 138 copies/mL. A negative result does not preclude SARS-Cov-2 infection and should not be used as the sole basis for treatment or other patient management decisions. A negative result may  occur with  improper specimen collection/handling, submission of specimen other than nasopharyngeal swab, presence of viral mutation(s) within the areas targeted by this assay, and inadequate number of viral copies(<138 copies/mL). A negative result must be combined with clinical observations, patient history, and epidemiological information. The expected result is Negative.  Fact Sheet for Patients:  EntrepreneurPulse.com.au  Fact Sheet for Healthcare Providers:  IncredibleEmployment.be  This test is no t yet approved or cleared by the [place] FDA and  has been authorized for detection and/or diagnosis of SARS-CoV-2 by FDA under an Emergency Use Authorization (EUA). This EUA will remain  in effect (meaning this test can be used) for the duration of the COVID-19 declaration under Section 564(b)(1) of the Act, 21 U.S.C.section 360bbb-3(b)(1), unless the authorization is terminated  or revoked sooner.       Influenza A by PCR NEGATIVE NEGATIVE Final   Influenza B by PCR NEGATIVE NEGATIVE Final    Comment: (NOTE) The Xpert Xpress SARS-CoV-2/FLU/RSV plus assay is intended as an aid in the diagnosis of influenza from Nasopharyngeal swab specimens and should not be used as a sole basis for treatment. Nasal washings and aspirates are unacceptable for Xpert Xpress SARS-CoV-2/FLU/RSV testing.  Fact Sheet for Patients: EntrepreneurPulse.com.au  Fact Sheet for Healthcare Providers: IncredibleEmployment.be  This test is not yet approved or cleared by the [place] FDA and has been authorized for detection and/or diagnosis of SARS-CoV-2 by FDA under an Emergency Use Authorization (EUA). This EUA will remain in effect (meaning this  test can be used) for the duration of the COVID-19 declaration under Section 564(b)(1) of the Act, 21 U.S.C. section 360bbb-3(b)(1), unless the authorization is terminated  or revoked.  Performed at [facility], [place] [address], [place], [place] [address]   Blood culture (routine x 2)     Status: None   Collection Time: 07/12/21  6:43 PM   Specimen: BLOOD RIGHT HAND  Result Value Ref Range Status   Specimen Description BLOOD RIGHT HAND  Final   Special Requests   Final    BOTTLES DRAWN AEROBIC AND ANAEROBIC Blood Culture results may not be optimal due to an inadequate volume of blood received in culture bottles   Culture   Final    NO GROWTH 5 DAYS Performed at [facility], [place] [address] [place], [place] [address]    Report Status 07/17/2021 FINAL  Final  Culture, blood (Routine X 2) w Reflex to ID Panel     Status: None (Preliminary result)   Collection Time: 07/16/21 10:44 AM   Specimen: BLOOD  Result Value Ref Range Status   Specimen Description BLOOD BLOOD LEFT ARM  Final   Special Requests   Final    BOTTLES DRAWN AEROBIC AND ANAEROBIC Blood Culture adequate volume   Culture   Final    NO GROWTH 2 DAYS Performed at [facility], [place] [address], [place], [place] [address]    Report Status PENDING  Incomplete  Culture, blood (Routine X 2) w Reflex to ID Panel     Status: None (Preliminary result)   Collection Time: 07/16/21 10:53 AM   Specimen: BLOOD  Result Value Ref Range Status   Specimen Description BLOOD LEFT ANTECUBITAL  Final   Special Requests   Final    BOTTLES DRAWN AEROBIC AND ANAEROBIC Blood Culture adequate volume   Culture   Final    NO GROWTH 2 DAYS Performed at [facility], [place] [address], [place], [place] [address]    Report Status PENDING  Incomplete    Oswald Hillock   [facility] If 7PM-7AM, please contact night-coverage at [email or web address], Office  [phone]   07/18/2021, 6:04 PM  LOS: 6 days

## 2021-07-18 NOTE — Care Management Important Message (Signed)
Important Message  Patient Details  Name: Yichen Gilardi MRN: [id number] Date of Birth: 04/30/1956   Medicare Important Message Given:  Yes     Sherilyn Banker 07/18/2021, 1:48 PM

## 2021-07-18 NOTE — Progress Notes (Signed)
Occupational Therapy Treatment Patient Details Name: Dale Edwards MRN: [id number] DOB: Oct 03, 1955 Today's Date: 07/18/2021   History of present illness Pt is a 65 y/o male admitted after beeing found down. Has laceration on head that was closed in ED. Also found to have Rhabdomyolysis. PMH includes MS, COPD, alcohol abuse, tobacco abuse.   OT comments  Pt is slowly progressing towards OT goals. During session, pt completed toilet transfer with Min guard and toilet hygiene with supervision. Pt also ambulated in room with Min guard using RW. Will continue to follow.   Recommendations for follow up therapy are one component of a multi-disciplinary discharge planning process, led by the attending physician.  Recommendations may be updated based on patient status, additional functional criteria and insurance authorization.    Follow Up Recommendations  Skilled nursing-short term rehab (<3 hours/day)    Assistance Recommended at Discharge Frequent or constant Supervision/Assistance  Equipment Recommendations  Other (comment) (Defer)    Recommendations for Other Services      Precautions / Restrictions Precautions Precautions: Fall;Other (comment) Precaution Comments: Monitor O2 Restrictions Weight Bearing Restrictions: No       Mobility Bed Mobility Overal bed mobility: Needs Assistance Bed Mobility: Supine to Sit     Supine to sit: Supervision          Transfers Overall transfer level: Needs assistance Equipment used: Rolling walker (2 wheels) Transfers: Sit to/from Stand Sit to Stand: Min guard                 Balance Overall balance assessment: Needs assistance Sitting-balance support: No upper extremity supported;Feet supported Sitting balance-Leahy Scale: Fair     Standing balance support: During functional activity Standing balance-Leahy Scale: Poor                             ADL either performed or assessed with clinical judgement    ADL Overall ADL's : Needs assistance/impaired                         Toilet Transfer: Min guard;Ambulation;BSC/3in1;Rolling walker (2 wheels)   Toileting- Clothing Manipulation and Hygiene: Supervision/safety       Functional mobility during ADLs: Min guard;Rolling walker (2 wheels)      Extremity/Trunk Assessment              Vision       Perception     Praxis      Cognition Arousal/Alertness: Awake/alert Behavior During Therapy: WFL for tasks assessed/performed Overall Cognitive Status: No family/caregiver present to determine baseline cognitive functioning                                            Exercises     Shoulder Instructions       General Comments      Pertinent Vitals/ Pain       Pain Assessment: No/denies pain  Home Living                                          Prior Functioning/Environment              Frequency  Min 2X/week        Progress Toward Goals  OT Goals(current goals can now be found in the care plan section)  Progress towards OT goals: Progressing toward goals  Acute Rehab OT Goals Patient Stated Goal: SNF for rehab OT Goal Formulation: With patient Time For Goal Achievement: 07/28/21 Potential to Achieve Goals: Good ADL Goals Pt Will Perform Grooming: with modified independence;standing Pt Will Perform Lower Body Bathing: with modified independence;sit to/from stand Pt Will Perform Lower Body Dressing: with modified independence;sit to/from stand Pt Will Transfer to Toilet: with modified independence;ambulating;regular height toilet  Plan Discharge plan remains appropriate;Frequency remains appropriate    Co-evaluation                 AM-PAC OT "6 Clicks" Daily Activity     Outcome Measure   Help from another person eating meals?: None Help from another person taking care of personal grooming?: A Little Help from another person toileting, which  includes using toliet, bedpan, or urinal?: A Little Help from another person bathing (including washing, rinsing, drying)?: A Lot Help from another person to put on and taking off regular upper body clothing?: A Little Help from another person to put on and taking off regular lower body clothing?: A Lot 6 Click Score: 17    End of Session Equipment Utilized During Treatment: Gait belt;Rolling walker (2 wheels)  OT Visit Diagnosis: Unsteadiness on feet (R26.81);Muscle weakness (generalized) (M62.81);History of falling (Z91.81);Ataxia, unspecified (R27.0)   Activity Tolerance Patient tolerated treatment well   Patient Left in chair;with call bell/phone within reach;with chair alarm set   Nurse Communication Mobility status        Time: 0932-6712 OT Time Calculation (min): 19 min  Charges: OT General Charges $OT Visit: 1 Visit OT Treatments $Self Care/Home Management : 8-22 mins  Dale Edwards, OT/L  Acute Rehab [phone]  Dale Edwards 07/18/2021, 11:46 AM

## 2021-07-18 NOTE — Progress Notes (Signed)
Mobility Specialist Criteria Algorithm Info.   07/18/21 1337  Mobility  Activity Ambulated in room;Dangled on edge of bed  Range of Motion/Exercises Active;All extremities  Level of Assistance Contact guard assist, steadying assist  Assistive Device Front wheel walker  Distance Ambulated (ft) 25 ft  Mobility Ambulated with assistance in room  Mobility Response Tolerated well  Mobility performed by Mobility specialist  Bed Position Semi-fowlers   Patient received in bed. Agreed to participate with max encouragement. Ambulated short distance in room and returned to EOB. Was left with all needs met and call bell in reach.    07/18/2021 3:36 PM

## 2021-07-19 ENCOUNTER — Encounter (HOSPITAL_COMMUNITY): Payer: Self-pay | Admitting: Family Medicine

## 2021-07-19 ENCOUNTER — Inpatient Hospital Stay (HOSPITAL_COMMUNITY): Payer: Medicare Other

## 2021-07-19 DIAGNOSIS — G35 Multiple sclerosis: Secondary | ICD-10-CM | POA: Diagnosis not present

## 2021-07-19 DIAGNOSIS — M899 Disorder of bone, unspecified: Secondary | ICD-10-CM | POA: Diagnosis not present

## 2021-07-19 DIAGNOSIS — R9389 Abnormal findings on diagnostic imaging of other specified body structures: Secondary | ICD-10-CM

## 2021-07-19 DIAGNOSIS — F1010 Alcohol abuse, uncomplicated: Secondary | ICD-10-CM | POA: Diagnosis not present

## 2021-07-19 DIAGNOSIS — F17210 Nicotine dependence, cigarettes, uncomplicated: Secondary | ICD-10-CM

## 2021-07-19 LAB — PSA: Prostatic Specific Antigen: 1.13 ng/mL (ref 0.00–4.00)

## 2021-07-19 MED ORDER — IOHEXOL 9 MG/ML PO SOLN
ORAL | Status: AC
  Administered 2021-07-19: 15:00:00 500 mL
  Filled 2021-07-19: qty 1000

## 2021-07-19 MED ORDER — IOHEXOL 300 MG/ML  SOLN
100.0000 mL | Freq: Once | INTRAMUSCULAR | Status: AC | PRN
  Administered 2021-07-19: 19:00:00 100 mL via INTRAVENOUS

## 2021-07-19 NOTE — Progress Notes (Signed)
Physical Therapy Treatment Patient Details Name: Dale Edwards MRN: [id number] DOB: [birth year] Today's Date: 07/19/2021   History of Present Illness Pt is a 65 y/o male admitted after beeing found down. Has laceration on head that was closed in ED. Also found to have Rhabdomyolysis. PMH includes MS, COPD, alcohol abuse, tobacco abuse.    PT Comments    Pt supine in bed.  He required encouragement and max cues for pacing.  Plan to try rollator next session for energy conservation management.  Continue to recommend post acute rehab to improve endurance.     Recommendations for follow up therapy are one component of a multi-disciplinary discharge planning process, led by the attending physician.  Recommendations may be updated based on patient status, additional functional criteria and insurance authorization.  Follow Up Recommendations  Skilled nursing-short term rehab (<3 hours/day)     Assistance Recommended at Discharge    Equipment Recommendations  Rollator (4 wheels)    Recommendations for Other Services       Precautions / Restrictions Precautions Precautions: Fall;Other (comment) Precaution Comments: Monitor O2 Restrictions Weight Bearing Restrictions: No     Mobility  Bed Mobility Overal bed mobility: Needs Assistance Bed Mobility: Supine to Sit;Sit to Supine     Supine to sit: Supervision Sit to supine: Supervision   General bed mobility comments: Supervision for safety, use of rail and increased time.    Transfers Overall transfer level: Needs assistance Equipment used: Rolling walker (2 wheels) Transfers: Sit to/from Stand Sit to Stand: Min assist;Min guard           General transfer comment: Min A to steady in standing, tremors noted in RUE/LE worsened in standing. approximation helped with RUE on RW.  Varying levels of assistance.    Ambulation/Gait Ambulation/Gait assistance: Min guard Gait Distance (Feet): 120 Feet Assistive device: Rolling  walker (2 wheels) Gait Pattern/deviations: Step-through pattern;Decreased stride length       General Gait Details: Slow, mildly unsteady gait with tremoring/jerking movements of RUE/LE (LE worse), 2/4 DOE./ Sp02 dropped to 80% on 3L 02, increased to 4L during first trial and dropped to 87% on 4L and increased to 6L during 2nd trial.  Fatigues   Stairs             Wheelchair Mobility    Modified Rankin (Stroke Patients Only)       Balance Overall balance assessment: Needs assistance Sitting-balance support: No upper extremity supported;Feet supported Sitting balance-Leahy Scale: Fair       Standing balance-Leahy Scale: Poor                              Cognition Arousal/Alertness: Awake/alert Behavior During Therapy: WFL for tasks assessed/performed Overall Cognitive Status: No family/caregiver present to determine baseline cognitive functioning                                 General Comments: Decreased safety awareness noted, reports poor memory.        Exercises      General Comments        Pertinent Vitals/Pain Pain Assessment: Faces Faces Pain Scale: Hurts little more Pain Descriptors / Indicators: Grimacing;Guarding;Sore Pain Intervention(s): Repositioned    Home Living                          Prior Function  PT Goals (current goals can now be found in the care plan section) Acute Rehab PT Goals Patient Stated Goal: to feel better Potential to Achieve Goals: Good Progress towards PT goals: Progressing toward goals    Frequency    Min 3X/week      PT Plan Current plan remains appropriate    Co-evaluation              AM-PAC PT "6 Clicks" Mobility   Outcome Measure  Help needed turning from your back to your side while in a flat bed without using bedrails?: A Little Help needed moving from lying on your back to sitting on the side of a flat bed without using bedrails?: A  Little Help needed moving to and from a bed to a chair (including a wheelchair)?: A Little Help needed standing up from a chair using your arms (e.g., wheelchair or bedside chair)?: A Little Help needed to walk in hospital room?: A Little Help needed climbing 3-5 steps with a railing? : A Lot 6 Click Score: 17    End of Session Equipment Utilized During Treatment: Gait belt Activity Tolerance: Patient limited by fatigue Patient left: in chair;with call bell/phone within reach;with chair alarm set Nurse Communication: Mobility status PT Visit Diagnosis: Unsteadiness on feet (R26.81);Muscle weakness (generalized) (M62.81)     Time: 9798-9211 PT Time Calculation (min) (ACUTE ONLY): 23 min  Charges:  $Therapeutic Activity: 23-37 mins                     Bonney Leitz , PTA Acute Rehabilitation Services Pager [phone] Office [phone]    [address] Artis Delay 07/19/2021, 12:40 PM

## 2021-07-19 NOTE — Plan of Care (Signed)

## 2021-07-19 NOTE — Progress Notes (Signed)
[facility]  PROGRESS NOTE  Dale Edwards SVX:[id number] DOB: 1956-06-14 DOA: 07/12/2021 PCP: Elwyn Reach, MD   Brief HPI:   65 year old male with past medical history of alcohol abuse, tobacco abuse, homelessness came to ED after being found on the ground with laceration of the left forehead.  He was consuming alcohol daily, drink of choice is whiskey with having several drinks before this episode.  CT of the head and neck showed no acute pathology.  It did show multiple lucencies along the cervical and thoracic body that were not present in 2016 with some confluent groundglass density in the left apex.  CT scan showed interstitial opacity left more than right favoring atypical infection.  Also has bullous emphysema.    Subjective   Patient seen and examined, no new complaints.   Assessment/Plan:     Acute hypoxemic respiratory failure -Secondary to aspiration pneumonia -Resolved -Completed IV antibiotics with Unasyn and Augmentin in the hospital -Patient uses 2 L of oxygen at home, likely at baseline -PT saw patient and recommended skilled nursing facility for rehab  Rhabdomyolysis -Resolved with IV fluids  Alcoholic ketoacidosis -Resolved  Abnormal lucencies -Noted on clavicle head, thoracic and cervical spine -Concern for multiple myeloma -Called and discussed with oncology, Dr. Lindi Adie -He will see patient in the hospital and recommended getting CT abdomen/pelvis with contrast, PSA, SPEP   Alcohol abuse -Continue thiamine, folate -Continue CIWA protocol  Traumatic laceration of scalp -Repaired by ED provider  Homelessness -Social worker following -To go to skilled nursing facility  Prolonged QTC -Try to keep mag more than 2, potassium greater than 4, phosphorus greater than 2      Medications     enoxaparin (LOVENOX) injection  40 mg Subcutaneous Q33A   folic acid  1 mg Oral Daily   metoprolol tartrate  12.5 mg Oral BID   multivitamin  with minerals  1 tablet Oral Daily   multivitamin with minerals  1 tablet Oral Daily   nicotine  14 mg Transdermal Daily   thiamine  100 mg Oral Daily   Or   thiamine  100 mg Intravenous Daily   thiamine  100 mg Oral Daily   Or   thiamine  100 mg Intravenous Daily     Data Reviewed:   CBG:  Recent Labs  Lab 07/12/21 2125 07/12/21 2147 07/12/21 2227  GLUCAP 58* 64* 116*    SpO2: 96 % O2 Flow Rate (L/min): 3 L/min    Vitals:   07/18/21 0812 07/18/21 2127 07/19/21 0741 07/19/21 0833  BP: 116/67 111/65 (!) 88/58 107/72  Pulse: 77 71 78   Resp: _0 Temp: (!) 97.5 F (36.4 C)  98.2 F (36.8 C)   TempSrc:      SpO2: 91% 92% 96%   Weight:         Intake/Output Summary (Last 24 hours) at 07/19/2021 1516 Last data filed at 07/19/2021 0859 Gross per 24 hour  Intake --  Output 1500 ml  Net -1500 ml    12/20 1901 - 12/22 0700 In: 240  Out: 1500 [Urine:1500]  Filed Weights   07/13/21 0744  Weight: 72.6 kg    Data Reviewed: Basic Metabolic Panel: Recent Labs  Lab 07/12/21 1648 07/12/21 1725 07/13/21 1645 07/14/21 0235  NA 137 136   136 134* 135  K 4.4 4.3   4.4 4.0 3.6  CL 104 106 105 104  CO2 14*  --  21* 22  GLUCOSE 78 77 123*  99  BUN 22 25* 18 17  CREATININE 0.86 0.80 1.08 0.83  CALCIUM 7.9*  --  8.1* 8.0*  MG 1.9  --  1.7  --   PHOS  --   --  2.0*  --    Liver Function Tests: Recent Labs  Lab 07/12/21 1648 07/13/21 1645  AST 89* 73*  ALT 51* 47*  ALKPHOS 82 65  BILITOT 1.6* 1.8*  PROT 5.9* 5.2*  ALBUMIN 3.0* 2.6*   No results for input(s): LIPASE, AMYLASE in the last 168 hours. Recent Labs  Lab 07/12/21 1648  AMMONIA 48*   CBC: Recent Labs  Lab 07/12/21 1648 07/12/21 1725 07/13/21 1645 07/14/21 0235 07/16/21 1001  WBC 18.2*  --  11.1* 7.8 7.3  NEUTROABS 16.6*  --   --  5.8 4.8  HGB 16.4 16.3   16.7 14.7 13.9 14.1  HCT 49.1 48.0   49.0 42.5 39.7 40.7  MCV 96.8  --  92.4 93.2 94.4  PLT 184  --  169 148* 170    Cardiac Enzymes: Recent Labs  Lab 07/12/21 1648 07/14/21 0235  CKTOTAL 1,200* 681*   BNP (last 3 results) No results for input(s): BNP in the last 8760 hours.  ProBNP (last 3 results) No results for input(s): PROBNP in the last 8760 hours.  CBG: Recent Labs  Lab 07/12/21 2125 07/12/21 2147 07/12/21 2227  GLUCAP 34* 64* 116*       Radiology Reports  No results found.     Antibiotics: Anti-infectives (From admission, onward)    Start     Dose/Rate Route Frequency Ordered Stop   07/15/21 1000  amoxicillin-clavulanate (AUGMENTIN) 875-125 MG per tablet 1 tablet        1 tablet Oral Every 12 hours 07/15/21 0833 07/16/21 2146   07/13/21 1400  ampicillin-sulbactam (UNASYN) 1.5 g in sodium chloride 0.9 % 100 mL IVPB  Status:  Discontinued        1.5 g 200 mL/hr over 30 Minutes Intravenous Every 6 hours 07/13/21 1333 07/15/21 0833   07/13/21 1100  Ampicillin-Sulbactam (UNASYN) 3 g in sodium chloride 0.9 % 100 mL IVPB  Status:  Discontinued        3 g 200 mL/hr over 30 Minutes Intravenous Every 6 hours 07/13/21 1009 07/13/21 1339   07/13/21 1000  vancomycin (VANCOCIN) IVPB 1000 mg/200 mL premix  Status:  Discontinued        1,000 mg 200 mL/hr over 60 Minutes Intravenous Every 12 hours 07/12/21 2056 07/13/21 1010   07/13/21 0800  ampicillin-sulbactam (UNASYN) 1.5 g in sodium chloride 0.9 % 100 mL IVPB  Status:  Discontinued        1.5 g 200 mL/hr over 30 Minutes Intravenous Every 6 hours 07/13/21 0740 07/13/21 1009   07/13/21 0400  ceFEPIme (MAXIPIME) 2 g in sodium chloride 0.9 % 100 mL IVPB  Status:  Discontinued        2 g 200 mL/hr over 30 Minutes Intravenous Every 8 hours 07/12/21 2056 07/13/21 0740   07/12/21 1830  ceFEPIme (MAXIPIME) 2 g in sodium chloride 0.9 % 100 mL IVPB        2 g 200 mL/hr over 30 Minutes Intravenous  Once 07/12/21 1815 07/12/21 1954   07/12/21 1830  metroNIDAZOLE (FLAGYL) IVPB 500 mg        500 mg 100 mL/hr over 60 Minutes Intravenous   Once 07/12/21 1815 07/12/21 2030   07/12/21 1830  vancomycin (VANCOREADY) IVPB 1500 mg/300 mL  1,500 mg 150 mL/hr over 120 Minutes Intravenous  Once 07/12/21 1815 07/12/21 2251         DVT prophylaxis: Lovenox  Code Status: Full code  Family Communication: No family at bedside   Consultants:   Procedures:     Objective    Physical Examination:   General-appears in no acute distress Heart-S1-S2, regular, no murmur auscultated Lungs-clear to auscultation bilaterally, no wheezing or crackles auscultated Abdomen-soft, nontender, no organomegaly Extremities-no edema in the lower extremities Neuro-alert, oriented x3, no focal deficit noted  Status is: Inpatient  Dispo: The patient is from: Homeless              Anticipated d/c is to: Skilled nursing facility              Anticipated d/c date is: To be decided              Patient currently stable for discharge  Barrier to discharge-awaiting bed placement at skilled nursing facility  COVID-19 Labs  No results for input(s): DDIMER, FERRITIN, LDH, CRP in the last 72 hours.  Lab Results  Component Value Date   [place] NEGATIVE 07/12/2021            Recent Results (from the past 240 hour(s))  Resp Panel by RT-PCR (Flu A&B, Covid) Nasopharyngeal Swab     Status: None   Collection Time: 07/12/21  5:07 PM   Specimen: Nasopharyngeal Swab; Nasopharyngeal(NP) swabs in vial transport medium  Result Value Ref Range Status   SARS Coronavirus 2 by RT PCR NEGATIVE NEGATIVE Final    Comment: (NOTE) SARS-CoV-2 target nucleic acids are NOT DETECTED.  The SARS-CoV-2 RNA is generally detectable in upper respiratory specimens during the acute phase of infection. The lowest concentration of SARS-CoV-2 viral copies this assay can detect is 138 copies/mL. A negative result does not preclude SARS-Cov-2 infection and should not be used as the sole basis for treatment or other patient management decisions. A  negative result may occur with  improper specimen collection/handling, submission of specimen other than nasopharyngeal swab, presence of viral mutation(s) within the areas targeted by this assay, and inadequate number of viral copies(<138 copies/mL). A negative result must be combined with clinical observations, patient history, and epidemiological information. The expected result is Negative.  Fact Sheet for Patients:  EntrepreneurPulse.com.au  Fact Sheet for Healthcare Providers:  IncredibleEmployment.be  This test is no t yet approved or cleared by the [place] FDA and  has been authorized for detection and/or diagnosis of SARS-CoV-2 by FDA under an Emergency Use Authorization (EUA). This EUA will remain  in effect (meaning this test can be used) for the duration of the COVID-19 declaration under Section 564(b)(1) of the Act, 21 U.S.C.section 360bbb-3(b)(1), unless the authorization is terminated  or revoked sooner.       Influenza A by PCR NEGATIVE NEGATIVE Final   Influenza B by PCR NEGATIVE NEGATIVE Final    Comment: (NOTE) The Xpert Xpress SARS-CoV-2/FLU/RSV plus assay is intended as an aid in the diagnosis of influenza from Nasopharyngeal swab specimens and should not be used as a sole basis for treatment. Nasal washings and aspirates are unacceptable for Xpert Xpress SARS-CoV-2/FLU/RSV testing.  Fact Sheet for Patients: EntrepreneurPulse.com.au  Fact Sheet for Healthcare Providers: IncredibleEmployment.be  This test is not yet approved or cleared by the [place] FDA and has been authorized for detection and/or diagnosis of SARS-CoV-2 by FDA under an Emergency Use Authorization (EUA). This EUA will remain in effect (meaning this  test can be used) for the duration of the COVID-19 declaration under Section 564(b)(1) of the Act, 21 U.S.C. section 360bbb-3(b)(1), unless the authorization  is terminated or revoked.  Performed at [facility], [place] [address] [place], [place] [address]   Blood culture (routine x 2)     Status: None   Collection Time: 07/12/21  6:43 PM   Specimen: BLOOD RIGHT HAND  Result Value Ref Range Status   Specimen Description BLOOD RIGHT HAND  Final   Special Requests   Final    BOTTLES DRAWN AEROBIC AND ANAEROBIC Blood Culture results may not be optimal due to an inadequate volume of blood received in culture bottles   Culture   Final    NO GROWTH 5 DAYS Performed at [facility], [place] [address] [place], [place] [address]    Report Status 07/17/2021 FINAL  Final  Culture, blood (Routine X 2) w Reflex to ID Panel     Status: None (Preliminary result)   Collection Time: 07/16/21 10:44 AM   Specimen: BLOOD  Result Value Ref Range Status   Specimen Description BLOOD BLOOD LEFT ARM  Final   Special Requests   Final    BOTTLES DRAWN AEROBIC AND ANAEROBIC Blood Culture adequate volume   Culture   Final    NO GROWTH 3 DAYS Performed at [facility], [place] [address] [place], [place] [address]    Report Status PENDING  Incomplete  Culture, blood (Routine X 2) w Reflex to ID Panel     Status: None (Preliminary result)   Collection Time: 07/16/21 10:53 AM   Specimen: BLOOD  Result Value Ref Range Status   Specimen Description BLOOD LEFT ANTECUBITAL  Final   Special Requests   Final    BOTTLES DRAWN AEROBIC AND ANAEROBIC Blood Culture adequate volume   Culture   Final    NO GROWTH 3 DAYS Performed at [facility], [place] [address] [place],  [address]    Report Status PENDING  Incomplete    Oswald Hillock   [facility] If 7PM-7AM, please contact night-coverage at [email or web address], Office  [phone]   07/19/2021, 3:16 PM  LOS: 7 days

## 2021-07-19 NOTE — Consult Note (Addendum)
[place]  Telephone:([phone]) [phone] Fax:(336) [phone]   MEDICAL ONCOLOGY - INITIAL CONSULTATION  Referral MD: Dr. Eleonore Chiquito  Reason for Referral: Bone lesions  HPI: Dale Edwards is a 65 year old male with a past medical history significant for MS, alcohol abuse, tobacco abuse, homelessness.  He presented to the emergency department after being found laying down on the ground.  He had a cut on his left forehead with hematoma and bleeding.  The patient does not seem to recall the events surrounding being found down.  H&P indicates that he reported drinking alcohol prior to this event.  Labs on admission showed a WBC of 13.5, hemoglobin 17.5, T bili 3.5.  His creatinine and calcium were normal.  In the emergency department, CT of the head and cervical spine showed no acute intracranial abnormality but did show multiple scattered subcentimeter lucent lesions in the cervical and upper thoracic vertebral bodies that were not apparent on prior MRI from 2016.  It was felt that these lesions could represent myeloma versus metastatic disease.  He also had a CT of the chest which showed interstitial and patchy groundglass opacities, left greater than right which favors pneumonic process including viral and atypical agents, increased bullous emphysematous disease in the right lung, mildly prominent mediastinal and left hilar lymph nodes which are possibly reactive, a few tiny lucent lesion suspected in the left clavicle head and in the upper 2 thoracic vertebrae.  Patient reports that he has had back pain which has been longstanding.  He also has a prior injury to his left shoulder does have some discomfort there as well.  Otherwise, no new bony pain.  He reports a good appetite.  He states that he has lost about 30 pounds over the past 3 to 4 years.  He is not having any headaches, dizziness, chest pain, shortness of breath.  Denies abdominal pain, nausea, vomiting.  He reports bilateral lower  extremity weakness, right greater than left.  However, he states that he was able to walk in the hallway yesterday.  He feels his legs are getting stronger.  The patient is single and has no children.  He is homeless.  He reports that he drinks alcohol on a daily basis when he can get it and also smoke up to 2 packs of cigarettes per day. Denies family history of malignancy.  Medical oncology was asked to see the patient make recommendations regarding his abnormal CT scan findings.  Past Medical History:  Diagnosis Date   Back pain    COPD (chronic obstructive pulmonary disease) (HCC)    Multiple sclerosis (HCC)    Tremors of nervous system    Right  :   Past Surgical History:  Procedure Laterality Date   ADENOIDECTOMY     broken leg     TONSILLECTOMY    :   Current Facility-Administered Medications  Medication Dose Route Frequency Provider Last Rate Last Admin   acetaminophen (TYLENOL) tablet 650 mg  650 mg Oral Q6H PRN Chotiner, Yevonne Aline, MD   650 mg at 07/19/21 4540   Or   acetaminophen (TYLENOL) suppository 650 mg  650 mg Rectal Q6H PRN Chotiner, Yevonne Aline, MD       enoxaparin (LOVENOX) injection 40 mg  40 mg Subcutaneous Q24H Chotiner, Yevonne Aline, MD   40 mg at 98/11/91 4782   folic acid (FOLVITE) tablet 1 mg  1 mg Oral Daily Chotiner, Yevonne Aline, MD   1 mg at 07/19/21 0854   ipratropium-albuterol (  DUONEB) 0.5-2.5 (3) MG/3ML nebulizer solution 3 mL  3 mL Nebulization Q6H PRN Chotiner, Yevonne Aline, MD       metoprolol tartrate (LOPRESSOR) tablet 12.5 mg  12.5 mg Oral BID Charlynne Cousins, MD   12.5 mg at 07/19/21 9562   multivitamin with minerals tablet 1 tablet  1 tablet Oral Daily Idamae Lusher, MD   1 tablet at 07/18/21 1308   multivitamin with minerals tablet 1 tablet  1 tablet Oral Daily Chotiner, Yevonne Aline, MD   1 tablet at 07/19/21 6578   nicotine (NICODERM CQ - dosed in mg/24 hours) patch 14 mg  14 mg Transdermal Daily Chotiner, Yevonne Aline, MD   14 mg at 07/19/21 4696    senna-docusate (Senokot-S) tablet 1 tablet  1 tablet Oral QHS PRN Chotiner, Yevonne Aline, MD       thiamine tablet 100 mg  100 mg Oral Daily Idamae Lusher, MD   100 mg at 07/18/21 0840   Or   thiamine (B-1) injection 100 mg  100 mg Intravenous Daily Idamae Lusher, MD       thiamine tablet 100 mg  100 mg Oral Daily Chotiner, Yevonne Aline, MD   100 mg at 07/19/21 2952   Or   thiamine (B-1) injection 100 mg  100 mg Intravenous Daily Chotiner, Yevonne Aline, MD          Allergies  Allergen Reactions   Hydrocodone-Acetaminophen Itching and Nausea Only       :   Family History  Problem Relation Age of Onset   Heart Problems Mother   :   Social History   Socioeconomic History   Marital status: Single    Spouse name: Not on file   Number of children: 0   Years of education: GED   Highest education level: Not on file  Occupational History    Employer: UNEMPLOYED    Comment: Disabled  Tobacco Use   Smoking status: Every Day    Packs/day: 0.50    Types: Cigarettes   Smokeless tobacco: Never   Tobacco comments:    Half Pack  Substance and Sexual Activity   Alcohol use: Yes    Alcohol/week: 1.0 standard drink    Types: 1 Standard drinks or equivalent per week    Comment: quit 2 years ago..   pt is now drinking on occ - very rare maybe once a month.   Drug use: No   Sexual activity: Not on file  Other Topics Concern   Not on file  Social History Narrative   Patient lives at home with Dale Edwards.   Patient does not have a significant other.    Patient is disabled.   Patient has no children.    Patient has his GED   Right handed.   Caffeine - four glasses and some sodas   Social Determinants of Health   Financial Resource Strain: Not on file  Food Insecurity: Not on file  Transportation Needs: Not on file  Physical Activity: Not on file  Stress: Not on file  Social Connections: Not on file  Intimate Partner Violence: Not on file  :  Review of Systems: A  comprehensive 14 point review of systems was negative except as noted in the HPI.  Exam: Patient Vitals for the past 24 hrs:  BP Temp Pulse Resp SpO2  07/19/21 0833 107/72 -- -- -- --  07/19/21 0741 (!) 88/58 98.2 F (36.8 C) 78 14 96 %  07/18/21 2127 111/65 -- 71  16 92 %    General: Awake and alert, no distress Eyes:  no scleral icterus.   ENT:  There were no oropharyngeal lesions.   Lymphatics:  Negative cervical, supraclavicular or axillary adenopathy.   Respiratory: lungs were clear bilaterally without wheezing or crackles.   Cardiovascular:  Regular rate and rhythm, S1/S2, without murmur, rub or gallop.  There was no pedal edema.   GI:  abdomen was soft, flat, nontender, nondistended, without organomegaly.   Skin exam was without echymosis, petichae.   Neuro exam was nonfocal. Patient was alert and oriented.  Attention was good.   Language was appropriate.  Mood was normal without depression.  Speech was not pressured.  Thought content was not tangential.     Lab Results  Component Value Date   WBC 7.3 07/16/2021   HGB 14.1 07/16/2021   HCT 40.7 07/16/2021   PLT 170 07/16/2021   GLUCOSE 99 07/14/2021   CHOL 231 (H) 01/08/2013   TRIG 267 (H) 01/08/2013   HDL 32 (L) 01/08/2013   LDLCALC 146 (H) 01/08/2013   ALT 47 (H) 07/13/2021   AST 73 (H) 07/13/2021   NA 135 07/14/2021   K 3.6 07/14/2021   CL 104 07/14/2021   CREATININE 0.83 07/14/2021   BUN 17 07/14/2021   CO2 22 07/14/2021    DG Chest 1 View  Result Date: 07/12/2021 CLINICAL DATA:  Unwitnessed fall EXAM: CHEST  1 VIEW COMPARISON:  03/25/2014 FINDINGS: Chronic changes in the right mid and lower lung compatible with scarring. Interstitial prominence within the left lung. Blunting of the right costophrenic angle, likely scarring. No effusion on the left. Heart is upper limits normal in size. No acute bony abnormality. IMPRESSION: Chronic changes on the right. Interstitial prominence within the left lung. This could  reflect chronic lung disease, less likely infection. Electronically Signed   By: Rolm Baptise M.D.   On: 07/12/2021 17:43   DG Pelvis 1-2 Views  Result Date: 07/12/2021 CLINICAL DATA:  Fall EXAM: PELVIS - 1-2 VIEW COMPARISON:  None. FINDINGS: Hip joints and SI joints are symmetric. No acute bony abnormality. Specifically, no fracture, subluxation, or dislocation. IMPRESSION: No acute bony abnormality. Electronically Signed   By: Rolm Baptise M.D.   On: 07/12/2021 17:44   DG Knee 2 Views Left  Result Date: 07/12/2021 CLINICAL DATA:  Fall EXAM: LEFT KNEE - 1-2 VIEW COMPARISON:  None. FINDINGS: No acute bony abnormality. Specifically, no fracture, subluxation, or dislocation. Joint spaces maintained. No joint effusion. IMPRESSION: No acute bony abnormality. Electronically Signed   By: Rolm Baptise M.D.   On: 07/12/2021 17:43   DG Knee 2 Views Right  Result Date: 07/12/2021 CLINICAL DATA:  Unwitnessed fall EXAM: RIGHT KNEE - 1-2 VIEW COMPARISON:  08/24/2014 FINDINGS: Old proximal right fibular fracture again seen, unchanged. No acute fracture, subluxation or dislocation. No joint effusion. Joint spaces maintained. IMPRESSION: No acute bony abnormality. Electronically Signed   By: Rolm Baptise M.D.   On: 07/12/2021 17:44   CT HEAD WO CONTRAST (5MM)  Result Date: 07/12/2021 CLINICAL DATA:  [place] yesterday. EXAM: CT HEAD WITHOUT CONTRAST CT CERVICAL SPINE WITHOUT CONTRAST TECHNIQUE: Multidetector CT imaging of the head and cervical spine was performed following the standard protocol without intravenous contrast. Multiplanar CT image reconstructions of the cervical spine were also generated. COMPARISON:  MRI brain dated May 08, 2019. MR cervical spine dated Nov 30, 2014. CT head dated August 06, 2012. FINDINGS: CT HEAD FINDINGS Brain: No evidence of acute infarction, hemorrhage,  hydrocephalus, extra-axial collection or mass lesion/mass effect. Stable moderate atrophy and chronic microvascular ischemic  changes. 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 Vascular: No hyperdense vessel or unexpected calcification. Skull: Normal. Negative for fracture or focal lesion. Sinuses/Orbits: No acute finding. Other: Small frontal scalp hematomas, greater on the left. CT CERVICAL SPINE FINDINGS Alignment: Normal. Skull base and vertebrae: No acute fracture. Multiple scattered subcentimeter lucent lesions in the visualized cervical and upper thoracic vertebral bodies, the largest at C6 measuring 6 mm (series 11, image 31). Soft tissues and spinal canal: No prevertebral fluid or swelling. No visible canal hematoma. Disc levels: Mild multilevel disc height loss and endplate spurring from Z6-X0 through C6-C7. Severe uncovertebral hypertrophy at C6-C7. Upper chest: Scarring and bullous emphysematous changes in the right upper lobe. Confluent ground-glass density in the left lung apex. Other: None. IMPRESSION: 1. No acute intracranial abnormality. Small frontal scalp hematomas, greater on the left. 2. No acute cervical spine fracture or traumatic listhesis. 3. Multiple scattered subcentimeter lucent lesions in the visualized cervical and upper thoracic vertebral bodies. These were not apparent on prior MRI cervical spine from 2016. Differential diagnosis includes multiple myeloma and metastatic disease. Appropriate oncologic workup is recommended. 4. Confluent ground-glass density in the left lung apex. This could represent pneumonia or aspiration. Consider further evaluation with chest CT. Electronically Signed   By: Titus Dubin M.D.   On: 07/12/2021 18:23   CT CHEST W CONTRAST  Result Date: 07/12/2021 CLINICAL DATA:  Evaluate left apical ground-glass infiltrates on cervical spine CT today, which also demonstrated subcentimeter lucent lesions in the cervical and upper thoracic spine consistent with Mets or myeloma. EXAM: CT CHEST WITH CONTRAST TECHNIQUE: Multidetector CT imaging of the chest was  performed during intravenous contrast administration. CONTRAST:  51m OMNIPAQUE IOHEXOL 300 MG/ML  SOLN COMPARISON:  Portable chest today, CTA chest 03/26/2014. FINDINGS: Cardiovascular: Increased prominence of the pulmonary trunk indicating arterial hypertension. There is no ce2ntral embolus. There is homogeneous aortic and great vessel enhancement, with patchy calcification in the arch. There is no pericardial effusion. The cardiac size is normal . Lipomatous hypertrophy of the intra-atrial septum is again noted. There are scattered three-vessel coronary artery calcifications. Mediastinum/Nodes: Normal thyroid and thoracic esophagus. New finding of slightly prominent left hilar nodes up to 1.3 cm in short axis, with new left prevascular space and precarinal space nodes up to 1 cm in short axis and a single prominent subcarinal lymph node to the right of 1.2 cm short axis. No right hilar adenopathy, axillary or supraclavicular masses. Lungs/Pleura: Interval worsening bullous disease and increasing scar-like opacities in the right middle and lower lobe bases, increased bullous disease in the apical and anterior right upper lobe with scarring change, and chronic right lateral base pleuroparenchymal scarring again is noted with chronic posterior right pleural thickening. No pleural effusion. There are mild increased paraseptal emphysematous changes in the left upper lobe. There is interval development of interstitial and patchy ground-glass opacities, widespread within the left upper and lower lobes with relative apical and lower lobe superior segment sparing with similar opacities noted anteriorly in the right upper lobe and patchily in the basal segments of the right lower lobe, but less dense and less extensive. The trachea and central airways are free of filling defect. Upper Abdomen: No acute abnormality. Mild-to-moderate hepatic steatosis. Scattered calcified hepatic granulomas. Musculoskeletal: No chest wall  abnormality. No acute or significant osseous findings. There is degenerative disc disease and spondylosis in the thoracic spine. There are few  tiny lucent lesions suspected in the head of the left clavicle and in the upper 2 thoracic vertebrae. No other focal bone lesion is seen. IMPRESSION: 1. Interstitial and patchy ground-glass opacities of left-greater-than-right lungs, favor pneumonic process including viral and atypical agents. Edema component less likely given lack of pleural effusions and venous distention. Alveolar proteinosis is also possible but rare. Follow-up study recommended to ensure clearing after treatment. 2. 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 Additional chronic pleural-parenchymal disease on the right. 3. Development of mildly prominent mediastinal and left hilar nodes, possibly reactive. No bulky or encasing adenopathy. 4. Aortic and coronary artery atherosclerosis. 5. Slightly prominent pulmonary trunk increased from previous exam. Lipomatous hypertrophy interatrial septum. 6. Fatty liver. 7. A few tiny lucent lesions suspected in the left clavicle head and in the upper 2 thoracic vertebrae. No destructive or expansile bone lesion. Electronically Signed   By: Telford Nab M.D.   On: 07/12/2021 21:45   CT CERVICAL SPINE WO CONTRAST  Result Date: 07/12/2021 CLINICAL DATA:  [place] yesterday. EXAM: CT HEAD WITHOUT CONTRAST CT CERVICAL SPINE WITHOUT CONTRAST TECHNIQUE: Multidetector CT imaging of the head and cervical spine was performed following the standard protocol without intravenous contrast. Multiplanar CT image reconstructions of the cervical spine were also generated. COMPARISON:  MRI brain dated May 08, 2019. MR cervical spine dated Nov 30, 2014. CT head dated August 06, 2012. FINDINGS: CT HEAD FINDINGS Brain: No evidence of acute infarction, hemorrhage, hydrocephalus, extra-axial collection or  mass lesion/mass effect. Stable moderate atrophy and chronic microvascular ischemic changes. 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 Vascular: No hyperdense vessel or unexpected calcification. Skull: Normal. Negative for fracture or focal lesion. Sinuses/Orbits: No acute finding. Other: Small frontal scalp hematomas, greater on the left. CT CERVICAL SPINE FINDINGS Alignment: Normal. Skull base and vertebrae: No acute fracture. Multiple scattered subcentimeter lucent lesions in the visualized cervical and upper thoracic vertebral bodies, the largest at C6 measuring 6 mm (series 11, image 31). Soft tissues and spinal canal: No prevertebral fluid or swelling. No visible canal hematoma. Disc levels: Mild multilevel disc height loss and endplate spurring from A2-Z3 through C6-C7. Severe uncovertebral hypertrophy at C6-C7. Upper chest: Scarring and bullous emphysematous changes in the right upper lobe. Confluent ground-glass density in the left lung apex. Other: None. IMPRESSION: 1. No acute intracranial abnormality. Small frontal scalp hematomas, greater on the left. 2. No acute cervical spine fracture or traumatic listhesis. 3. Multiple scattered subcentimeter lucent lesions in the visualized cervical and upper thoracic vertebral bodies. These were not apparent on prior MRI cervical spine from 2016. Differential diagnosis includes multiple myeloma and metastatic disease. Appropriate oncologic workup is recommended. 4. Confluent ground-glass density in the left lung apex. This could represent pneumonia or aspiration. Consider further evaluation with chest CT. Electronically Signed   By: Titus Dubin M.D.   On: 07/12/2021 18:23     DG Chest 1 View  Result Date: 07/12/2021 CLINICAL DATA:  Unwitnessed fall EXAM: CHEST  1 VIEW COMPARISON:  03/25/2014 FINDINGS: Chronic changes in the right mid and lower lung compatible with scarring. Interstitial prominence within the left lung. Blunting of  the right costophrenic angle, likely scarring. No effusion on the left. Heart is upper limits normal in size. No acute bony abnormality. IMPRESSION: Chronic changes on the right. Interstitial prominence within the left lung. This could reflect chronic lung disease, less likely infection. Electronically Signed   By: Rolm Baptise  M.D.   On: 07/12/2021 17:43   DG Pelvis 1-2 Views  Result Date: 07/12/2021 CLINICAL DATA:  Fall EXAM: PELVIS - 1-2 VIEW COMPARISON:  None. FINDINGS: Hip joints and SI joints are symmetric. No acute bony abnormality. Specifically, no fracture, subluxation, or dislocation. IMPRESSION: No acute bony abnormality. Electronically Signed   By: Rolm Baptise M.D.   On: 07/12/2021 17:44   DG Knee 2 Views Left  Result Date: 07/12/2021 CLINICAL DATA:  Fall EXAM: LEFT KNEE - 1-2 VIEW COMPARISON:  None. FINDINGS: No acute bony abnormality. Specifically, no fracture, subluxation, or dislocation. Joint spaces maintained. No joint effusion. IMPRESSION: No acute bony abnormality. Electronically Signed   By: Rolm Baptise M.D.   On: 07/12/2021 17:43   DG Knee 2 Views Right  Result Date: 07/12/2021 CLINICAL DATA:  Unwitnessed fall EXAM: RIGHT KNEE - 1-2 VIEW COMPARISON:  08/24/2014 FINDINGS: Old proximal right fibular fracture again seen, unchanged. No acute fracture, subluxation or dislocation. No joint effusion. Joint spaces maintained. IMPRESSION: No acute bony abnormality. Electronically Signed   By: Rolm Baptise M.D.   On: 07/12/2021 17:44   CT HEAD WO CONTRAST (5MM)  Result Date: 07/12/2021 CLINICAL DATA:  [place] yesterday. EXAM: CT HEAD WITHOUT CONTRAST CT CERVICAL SPINE WITHOUT CONTRAST TECHNIQUE: Multidetector CT imaging of the head and cervical spine was performed following the standard protocol without intravenous contrast. Multiplanar CT image reconstructions of the cervical spine were also generated. COMPARISON:  MRI brain dated May 08, 2019. MR cervical spine dated Nov 30, 2014.  CT head dated August 06, 2012. FINDINGS: CT HEAD FINDINGS Brain: No evidence of acute infarction, hemorrhage, hydrocephalus, extra-axial collection or mass lesion/mass effect. Stable moderate atrophy and chronic microvascular ischemic changes. 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 Vascular: No hyperdense vessel or unexpected calcification. Skull: Normal. Negative for fracture or focal lesion. Sinuses/Orbits: No acute finding. Other: Small frontal scalp hematomas, greater on the left. CT CERVICAL SPINE FINDINGS Alignment: Normal. Skull base and vertebrae: No acute fracture. Multiple scattered subcentimeter lucent lesions in the visualized cervical and upper thoracic vertebral bodies, the largest at C6 measuring 6 mm (series 11, image 31). Soft tissues and spinal canal: No prevertebral fluid or swelling. No visible canal hematoma. Disc levels: Mild multilevel disc height loss and endplate spurring from X4-G8 through C6-C7. Severe uncovertebral hypertrophy at C6-C7. Upper chest: Scarring and bullous emphysematous changes in the right upper lobe. Confluent ground-glass density in the left lung apex. Other: None. IMPRESSION: 1. No acute intracranial abnormality. Small frontal scalp hematomas, greater on the left. 2. No acute cervical spine fracture or traumatic listhesis. 3. Multiple scattered subcentimeter lucent lesions in the visualized cervical and upper thoracic vertebral bodies. These were not apparent on prior MRI cervical spine from 2016. Differential diagnosis includes multiple myeloma and metastatic disease. Appropriate oncologic workup is recommended. 4. Confluent ground-glass density in the left lung apex. This could represent pneumonia or aspiration. Consider further evaluation with chest CT. Electronically Signed   By: Titus Dubin M.D.   On: 07/12/2021 18:23   CT CHEST W CONTRAST  Result Date: 07/12/2021 CLINICAL DATA:  Evaluate left apical ground-glass infiltrates on  cervical spine CT today, which also demonstrated subcentimeter lucent lesions in the cervical and upper thoracic spine consistent with Mets or myeloma. EXAM: CT CHEST WITH CONTRAST TECHNIQUE: Multidetector CT imaging of the chest was performed during intravenous contrast administration. CONTRAST:  71m OMNIPAQUE IOHEXOL 300 MG/ML  SOLN COMPARISON:  Portable chest today, CTA chest 03/26/2014. FINDINGS: Cardiovascular: Increased  prominence of the pulmonary trunk indicating arterial hypertension. There is no ce2ntral embolus. There is homogeneous aortic and great vessel enhancement, with patchy calcification in the arch. There is no pericardial effusion. The cardiac size is normal . Lipomatous hypertrophy of the intra-atrial septum is again noted. There are scattered three-vessel coronary artery calcifications. Mediastinum/Nodes: Normal thyroid and thoracic esophagus. New finding of slightly prominent left hilar nodes up to 1.3 cm in short axis, with new left prevascular space and precarinal space nodes up to 1 cm in short axis and a single prominent subcarinal lymph node to the right of 1.2 cm short axis. No right hilar adenopathy, axillary or supraclavicular masses. Lungs/Pleura: Interval worsening bullous disease and increasing scar-like opacities in the right middle and lower lobe bases, increased bullous disease in the apical and anterior right upper lobe with scarring change, and chronic right lateral base pleuroparenchymal scarring again is noted with chronic posterior right pleural thickening. No pleural effusion. There are mild increased paraseptal emphysematous changes in the left upper lobe. There is interval development of interstitial and patchy ground-glass opacities, widespread within the left upper and lower lobes with relative apical and lower lobe superior segment sparing with similar opacities noted anteriorly in the right upper lobe and patchily in the basal segments of the right lower lobe, but  less dense and less extensive. The trachea and central airways are free of filling defect. Upper Abdomen: No acute abnormality. Mild-to-moderate hepatic steatosis. Scattered calcified hepatic granulomas. Musculoskeletal: No chest wall abnormality. No acute or significant osseous findings. There is degenerative disc disease and spondylosis in the thoracic spine. There are few tiny lucent lesions suspected in the head of the left clavicle and in the upper 2 thoracic vertebrae. No other focal bone lesion is seen. IMPRESSION: 1. Interstitial and patchy ground-glass opacities of left-greater-than-right lungs, favor pneumonic process including viral and atypical agents. Edema component less likely given lack of pleural effusions and venous distention. Alveolar proteinosis is also possible but rare. Follow-up study recommended to ensure clearing after treatment. 2. 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 Additional chronic pleural-parenchymal disease on the right. 3. Development of mildly prominent mediastinal and left hilar nodes, possibly reactive. No bulky or encasing adenopathy. 4. Aortic and coronary artery atherosclerosis. 5. Slightly prominent pulmonary trunk increased from previous exam. Lipomatous hypertrophy interatrial septum. 6. Fatty liver. 7. A few tiny lucent lesions suspected in the left clavicle head and in the upper 2 thoracic vertebrae. No destructive or expansile bone lesion. Electronically Signed   By: Telford Nab M.D.   On: 07/12/2021 21:45   CT CERVICAL SPINE WO CONTRAST  Result Date: 07/12/2021 CLINICAL DATA:  [place] yesterday. EXAM: CT HEAD WITHOUT CONTRAST CT CERVICAL SPINE WITHOUT CONTRAST TECHNIQUE: Multidetector CT imaging of the head and cervical spine was performed following the standard protocol without intravenous contrast. Multiplanar CT image reconstructions of the cervical spine were also generated.  COMPARISON:  MRI brain dated May 08, 2019. MR cervical spine dated Nov 30, 2014. CT head dated August 06, 2012. FINDINGS: CT HEAD FINDINGS Brain: No evidence of acute infarction, hemorrhage, hydrocephalus, extra-axial collection or mass lesion/mass effect. Stable moderate atrophy and chronic microvascular ischemic changes. 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 Vascular: No hyperdense vessel or unexpected calcification. Skull: Normal. Negative for fracture or focal lesion. Sinuses/Orbits: No acute finding. Other: Small frontal scalp hematomas, greater on the left. CT CERVICAL SPINE FINDINGS Alignment: Normal. Skull base and vertebrae:  No acute fracture. Multiple scattered subcentimeter lucent lesions in the visualized cervical and upper thoracic vertebral bodies, the largest at C6 measuring 6 mm (series 11, image 31). Soft tissues and spinal canal: No prevertebral fluid or swelling. No visible canal hematoma. Disc levels: Mild multilevel disc height loss and endplate spurring from T0-Y1 through C6-C7. Severe uncovertebral hypertrophy at C6-C7. Upper chest: Scarring and bullous emphysematous changes in the right upper lobe. Confluent ground-glass density in the left lung apex. Other: None. IMPRESSION: 1. No acute intracranial abnormality. Small frontal scalp hematomas, greater on the left. 2. No acute cervical spine fracture or traumatic listhesis. 3. Multiple scattered subcentimeter lucent lesions in the visualized cervical and upper thoracic vertebral bodies. These were not apparent on prior MRI cervical spine from 2016. Differential diagnosis includes multiple myeloma and metastatic disease. Appropriate oncologic workup is recommended. 4. Confluent ground-glass density in the left lung apex. This could represent pneumonia or aspiration. Consider further evaluation with chest CT. Electronically Signed   By: Titus Dubin M.D.   On: 07/12/2021 18:23    Assessment and Plan:  1.  Bone  lesions noted on imaging concerning for metastatic disease versus myeloma 2.  Multiple sclerosis 3.  Alcohol abuse 4.  Tobacco dependence 5.  Homelessness  -Imaging results were reviewed with the patient.  We discussed that findings are concerning for possible malignancy such as myeloma versus metastatic disease.  Recommend additional work-up including a CT of the abdomen/pelvis, PSA, multiple myeloma panel.  We further discussed that a biopsy may be needed pending results of above work-up but will provide additional recommendations once we have more information. -The patient has been followed by Dr. Krista Blue of [facility] for MS.  He has been on Tysarbi in the past but off since about March 2022 due to transportation issues.  He will continue outpatient follow-up with neurology. -Advised abstaining from alcohol. -Recommend tobacco cessation. -Social work following due to homelessness and may be placed at [facility] upon hospital discharge.  Thank you for this referral.   Dale Bussing, DNP, AGPCNP-BC, AOCNP   Attending Note  I personally saw the patient, reviewed the chart and examined the patient. I agree with the assessment and plan as documented above. Thank you very much for the consultation.   Multiple scattered subcentimeter lucent lesions in the cervical and thoracic vertebral bodies: Differential diagnosis metastatic carcinoma versus myeloma Plan: Serum protein electrophoresis, CT abdomen pelvis, PSA

## 2021-07-19 NOTE — TOC Progression Note (Addendum)
Transition of Care [facility]) - Progression Note    Patient Details  Name: Dale Edwards MRN: [id number] Date of Birth: 07-10-1956  Transition of Care [facility]) CM/SW Contact  Lorri Frederick, LCSW Phone Number: 07/19/2021, 1:39 PM  Clinical Narrative:  CSW contacted [facility] and asked them to review referral.  CSW spoke with Jocelyn at [facility] in [place].  She did not receive previous fax, referral was refaxed to her at [phone].     1430: QC meeting: pt walking 120 feet min guard, probable that auth would be denied for SNF.  Need to consider other options.      Expected Discharge Plan: Skilled Nursing Facility Barriers to Discharge: Homeless with medical needs, No SNF bed  Expected Discharge Plan and Services Expected Discharge Plan: Skilled Nursing Facility     Post Acute Care Choice: Skilled Nursing Facility Living arrangements for the past 2 months: Homeless                                       Social Determinants of Health (SDOH) Interventions    Readmission Risk Interventions No flowsheet data found.

## 2021-07-19 NOTE — Progress Notes (Signed)
Mobility Specialist Criteria Algorithm Info.   07/19/21 1408  Mobility  Activity Ambulated in hall;Ambulated in room (in chair before and after ambulation)  Range of Motion/Exercises Active;All extremities  Level of Assistance Standby assist, set-up cues, supervision of patient - no hands on  Assistive Device Front wheel walker  Distance Ambulated (ft) 40 ft  Mobility Ambulated with assistance in hallway;Ambulated with assistance in room  Mobility Response Tolerated well  Mobility performed by Mobility specialist  Bed Position Chair   Patient received in chair. Ambulated short distance min guard with slow steady gait. Returned to chair without complaint or incident. Was left in chair with all needs met and call bell in reach.   07/19/2021 4:37 PM

## 2021-07-19 NOTE — Plan of Care (Signed)

## 2021-07-20 DIAGNOSIS — F1010 Alcohol abuse, uncomplicated: Secondary | ICD-10-CM | POA: Diagnosis not present

## 2021-07-20 DIAGNOSIS — M899 Disorder of bone, unspecified: Secondary | ICD-10-CM | POA: Diagnosis not present

## 2021-07-20 DIAGNOSIS — G35 Multiple sclerosis: Secondary | ICD-10-CM | POA: Diagnosis not present

## 2021-07-20 DIAGNOSIS — R9389 Abnormal findings on diagnostic imaging of other specified body structures: Secondary | ICD-10-CM | POA: Diagnosis not present

## 2021-07-20 LAB — KAPPA/LAMBDA LIGHT CHAINS
Kappa free light chain: 16.9 mg/L (ref 3.3–19.4)
Kappa, lambda light chain ratio: 1.11 (ref 0.26–1.65)
Lambda free light chains: 15.2 mg/L (ref 5.7–26.3)

## 2021-07-20 LAB — CK: Total CK: 42 U/L — ABNORMAL LOW (ref 49–397)

## 2021-07-20 NOTE — Progress Notes (Signed)
Physical Therapy Treatment Patient Details Name: Dale Edwards MRN: [id number] DOB: Oct 19, 1955 Today's Date: 07/20/2021   History of Present Illness Pt is a 65 y/o male admitted after beeing found down. Has laceration on head that was closed in ED. Also found to have Rhabdomyolysis. PMH includes MS, COPD, alcohol abuse, tobacco abuse.    PT Comments    Pt supine in bed and required max cues for encouragement this session.  Pt continues to benefit from PT to improve pacing and cues for rest periods.  Pt trialed rollator this session and it worked great for him.  He continues to need practice with braking mechanisms and safety during transfers.  Will continue to follow.  Marland Kitchenai  Recommendations for follow up therapy are one component of a multi-disciplinary discharge planning process, led by the attending physician.  Recommendations may be updated based on patient status, additional functional criteria and insurance authorization.  Follow Up Recommendations  Skilled nursing-short term rehab (<3 hours/day) (for enegry conservation and to improve endurance.)     Assistance Recommended at Discharge Frequent or constant Supervision/Assistance  Equipment Recommendations  Rollator (4 wheels)    Recommendations for Other Services       Precautions / Restrictions Precautions Precautions: Fall;Other (comment) Precaution Comments: Monitor O2 Restrictions Weight Bearing Restrictions: No     Mobility  Bed Mobility Overal bed mobility: Needs Assistance Bed Mobility: Supine to Sit     Supine to sit: Modified independent (Device/Increase time)          Transfers Overall transfer level: Needs assistance Equipment used: Rollator (4 wheels) Transfers: Sit to/from Stand Sit to Stand: Min guard           General transfer comment: Cues for hand placement on rollator and to push from seated surface.  Cues for locking brakes when sitting and standing to improve safety.     Ambulation/Gait Ambulation/Gait assistance: Min guard Gait Distance (Feet): 100 Feet (x2) Assistive device: Rollator (4 wheels) Gait Pattern/deviations: Step-through pattern;Decreased stride length Gait velocity: Decreased     General Gait Details: Minor zig zag steering walker due to him tremor but overall keeps the device straight as the rollator rolls more freely to compensate for his tremor.   Stairs             Wheelchair Mobility    Modified Rankin (Stroke Patients Only)       Balance Overall balance assessment: Needs assistance Sitting-balance support: No upper extremity supported;Feet supported Sitting balance-Leahy Scale: Fair       Standing balance-Leahy Scale: Poor Standing balance comment: Reliant on BUE support                            Cognition Arousal/Alertness: Awake/alert Behavior During Therapy: WFL for tasks assessed/performed Overall Cognitive Status: No family/caregiver present to determine baseline cognitive functioning                                 General Comments: Decreased safety awareness noted, reports poor memory.        Exercises      General Comments        Pertinent Vitals/Pain Pain Assessment: 0-10 Faces Pain Scale: Hurts little more Pain Location: generalized Pain Intervention(s): Monitored during session;Repositioned    Home Living  Prior Function            PT Goals (current goals can now be found in the care plan section) Acute Rehab PT Goals Patient Stated Goal: to feel better Potential to Achieve Goals: Good Progress towards PT goals: Progressing toward goals    Frequency    Min 3X/week      PT Plan Current plan remains appropriate    Co-evaluation              AM-PAC PT "6 Clicks" Mobility   Outcome Measure  Help needed turning from your back to your side while in a flat bed without using bedrails?: A Little Help  needed moving from lying on your back to sitting on the side of a flat bed without using bedrails?: A Little Help needed moving to and from a bed to a chair (including a wheelchair)?: A Little Help needed standing up from a chair using your arms (e.g., wheelchair or bedside chair)?: A Little Help needed to walk in hospital room?: A Little Help needed climbing 3-5 steps with a railing? : A Little 6 Click Score: 18    End of Session Equipment Utilized During Treatment: Oxygen Activity Tolerance: Patient limited by fatigue Patient left: in chair;with call bell/phone within reach;with chair alarm set Nurse Communication: Mobility status PT Visit Diagnosis: Unsteadiness on feet (R26.81);Muscle weakness (generalized) (M62.81)     Time: 5621-3086 PT Time Calculation (min) (ACUTE ONLY): 19 min  Charges:  $Gait Training: 8-22 mins                     Bonney Leitz , PTA Acute Rehabilitation Services Pager [phone] Office [phone]    Khambrel Amsden Artis Delay 07/20/2021, 11:39 AM

## 2021-07-20 NOTE — Plan of Care (Signed)

## 2021-07-20 NOTE — Progress Notes (Signed)
°   07/20/21 0925  Mobility  Activity Refused mobility (Pt declined will check back)

## 2021-07-20 NOTE — Progress Notes (Signed)
HEMATOLOGY-ONCOLOGY PROGRESS NOTE  SUBJECTIVE: Patient with extensive past medical problems of alcohol abuse homelessness tobacco abuse came to the hospital and a CT scan of the head and neck revealed few lucencies in the cervical vertebrae as well as clavicle.  We were consulted for this and we performed a CT scan of his abdomen pelvis along with PSA and serum protein electrophoresis.   OBJECTIVE: REVIEW OF SYSTEMS:   Constitutional: Awake alert and oriented Denies any localizing or focal symptoms  PHYSICAL EXAMINATION: ECOG PERFORMANCE STATUS: 3 - Symptomatic, >50% confined to bed  Vitals:   07/20/21 1152 07/20/21 1226  BP: 97/65 97/65  Pulse: 67 67  Resp: 17 17  Temp: 98.4 F (36.9 C) 98.4 F (36.9 C)  SpO2: 98%    Filed Weights   07/13/21 0744 07/20/21 1226  Weight: 160 lb 0.9 oz (72.6 kg) 145 lb (65.8 kg)      LABORATORY DATA:  I have reviewed the data as listed CMP Latest Ref Rng & Units 07/14/2021 07/13/2021 07/12/2021  Glucose 70 - 99 mg/dL 99 102(H) -  BUN 8 - 23 mg/dL 17 18 -  Creatinine 8.52 - 1.24 mg/dL 7.78 2.42 -  Sodium 353 - 145 mmol/L 135 134(L) 136  Potassium 3.5 - 5.1 mmol/L 3.6 4.0 4.3  Chloride 98 - 111 mmol/L 104 105 -  CO2 22 - 32 mmol/L 22 21(L) -  Calcium 8.9 - 10.3 mg/dL 8.0(L) 8.1(L) -  Total Protein 6.5 - 8.1 g/dL - 5.2(L) -  Total Bilirubin 0.3 - 1.2 mg/dL - 1.8(H) -  Alkaline Phos 38 - 126 U/L - 65 -  AST 15 - 41 U/L - 73(H) -  ALT 0 - 44 U/L - 47(H) -    Lab Results  Component Value Date   WBC 7.3 07/16/2021   HGB 14.1 07/16/2021   HCT 40.7 07/16/2021   MCV 94.4 07/16/2021   PLT 170 07/16/2021   NEUTROABS 4.8 07/16/2021    ASSESSMENT AND PLAN: 1.  Lucent lesions in the cervical, thoracic vertebrae as well as clavicle: CT of the chest abdomen pelvis does not show any evidence of primary malignancy. Kappa lambda ratio: Normal SPEP: Pending PSA: Normal No evidence of any metastatic lesions in any of his organs. I discussed  with him that the only way to identify the cause of these lesions I discussed the case with Dr. Sharl Ma who will request interventional radiology to assess the scans and determine if anything can be biopsied. If it cannot be biopsied then we will watch him as an outpatient with periodic scans.

## 2021-07-20 NOTE — Progress Notes (Signed)
Request received for bone lesion bx. Dr. Corliss Skains will be off all week next week, Dr. Tommie Sams is off today.   Will review the case with Dr. Tommie Sams early next week.   Biopsy can be done as an outpatient procedure.  If patient is to be discharged over the weekend, please place an outpatient order for the bone biopsy, case will be reviewed by IR prior to scheduling.    Lynann Bologna Shandel Busic PA-C 07/20/2021 3:31 PM

## 2021-07-20 NOTE — Plan of Care (Signed)

## 2021-07-20 NOTE — Progress Notes (Signed)
[facility]  PROGRESS NOTE  Dale Edwards NPY:[id number] DOB: 10/01/1955 DOA: 07/12/2021 PCP: Elwyn Reach, MD   Brief HPI:   65 year old male with past medical history of alcohol abuse, tobacco abuse, homelessness came to ED after being found on the ground with laceration of the left forehead.  He was consuming alcohol daily, drink of choice is whiskey with having several drinks before this episode.  CT of the head and neck showed no acute pathology.  It did show multiple lucencies along the cervical and thoracic body that were not present in 2016 with some confluent groundglass density in the left apex.  CT scan showed interstitial opacity left more than right favoring atypical infection.  Also has bullous emphysema.    Subjective   Patient seen, denies any pain.  No chest pain or shortness of breath   Assessment/Plan:     Acute hypoxemic respiratory failure -Secondary to aspiration pneumonia -Resolved -Completed IV antibiotics with Unasyn and Augmentin in the hospital -Patient uses 2 L of oxygen at home, likely at baseline -PT saw patient and recommended skilled nursing facility for rehab  Rhabdomyolysis -Resolved with IV fluids  Alcoholic ketoacidosis -Resolved  Abnormal lucencies/?  Malignancy -Noted on clavicle head, thoracic and cervical spine -Concern for multiple myeloma -Called and discussed with oncology, Dr. Lindi Adie -He recommended getting CT abdomen/pelvis with contrast, PSA, SPEP -PSA is normal -CT abdomen/pelvis is normal -SPEP is pending -Kappa/lambda light chain negative -Discussed with Dr. Lindi Adie, he recommends getting IR evaluation to see if we can biopsy the lesions noted on the cervical and thoracic spine or clavicle head -If IR cannot biopsy and then he will follow-up in the office with periodic scans as outpatient   Alcohol abuse -Continue thiamine, folate -Continue CIWA protocol  Traumatic laceration of scalp -Repaired by ED  provider  Homelessness -Social worker following -To go to skilled nursing facility  Prolonged QTC -Try to keep mag more than 2, potassium greater than 4, phosphorus greater than 2      Medications     enoxaparin (LOVENOX) injection  40 mg Subcutaneous N35A   folic acid  1 mg Oral Daily   metoprolol tartrate  12.5 mg Oral BID   multivitamin with minerals  1 tablet Oral Daily   multivitamin with minerals  1 tablet Oral Daily   nicotine  14 mg Transdermal Daily   thiamine  100 mg Oral Daily   Or   thiamine  100 mg Intravenous Daily   thiamine  100 mg Oral Daily   Or   thiamine  100 mg Intravenous Daily     Data Reviewed:   CBG:  No results for input(s): GLUCAP in the last 168 hours.   SpO2: 98 % O2 Flow Rate (L/min): 3 L/min    Vitals:   07/20/21 0824 07/20/21 1151 07/20/21 1152 07/20/21 1226  BP: 1'05/67 97/65 97/65 ' 97/65  Pulse: 80 98 67 67  Resp: '19 19 17 17  ' Temp: 98.5 F (36.9 C) 98.4 F (36.9 C) 98.4 F (36.9 C) 98.4 F (36.9 C)  TempSrc: Oral Oral Oral Oral  SpO2: 94% 96% 98%   Weight:    65.8 kg  Height:    '5\' 6"'  (1.676 m)     Intake/Output Summary (Last 24 hours) at 07/20/2021 1525 Last data filed at 07/20/2021 0516 Gross per 24 hour  Intake 480 ml  Output 1800 ml  Net -1320 ml    12/21 1901 - 12/23 0700 In: 480 [P.O.:480] Out: 3300 [POLID:0301]  Filed Weights   07/13/21 0744 07/20/21 1226  Weight: 72.6 kg 65.8 kg    Data Reviewed: Basic Metabolic Panel: Recent Labs  Lab 07/13/21 1645 07/14/21 0235  NA 134* 135  K 4.0 3.6  CL 105 104  CO2 21* 22  GLUCOSE 123* 99  BUN 18 17  CREATININE 1.08 0.83  CALCIUM 8.1* 8.0*  MG 1.7  --   PHOS 2.0*  --    Liver Function Tests: Recent Labs  Lab 07/13/21 1645  AST 73*  ALT 47*  ALKPHOS 65  BILITOT 1.8*  PROT 5.2*  ALBUMIN 2.6*   No results for input(s): LIPASE, AMYLASE in the last 168 hours. No results for input(s): AMMONIA in the last 168 hours.  CBC: Recent Labs  Lab  07/13/21 1645 07/14/21 0235 07/16/21 1001  WBC 11.1* 7.8 7.3  NEUTROABS  --  5.8 4.8  HGB 14.7 13.9 14.1  HCT 42.5 39.7 40.7  MCV 92.4 93.2 94.4  PLT 169 148* 170   Cardiac Enzymes: Recent Labs  Lab 07/14/21 0235  CKTOTAL 681*   BNP (last 3 results) No results for input(s): BNP in the last 8760 hours.  ProBNP (last 3 results) No results for input(s): PROBNP in the last 8760 hours.  CBG: No results for input(s): GLUCAP in the last 168 hours.      Radiology Reports  CT ABDOMEN PELVIS W CONTRAST  Result Date: 07/19/2021 CLINICAL DATA:  Metastatic disease evaluation. Abnormal lucencies -Noted on clavicle head, thoracic and cervical spine -Concern for multiple myeloma - EXAM: CT ABDOMEN AND PELVIS WITH CONTRAST TECHNIQUE: Multidetector CT imaging of the abdomen and pelvis was performed using the standard protocol following bolus administration of intravenous contrast. CONTRAST:  14m OMNIPAQUE IOHEXOL 300 MG/ML  SOLN COMPARISON:  CT abdomen pelvis 07/12/2021 FINDINGS: Lower chest: Interval increase in bilateral trace pleural effusions, left greater than right. Redemonstration of bibasilar interlobular wall septal thickening and ground-glass airspace opacities. Hepatobiliary: Couple calcifications within the hepatic parenchyma are nonspecific. No focal liver abnormality. No gallstones, gallbladder wall thickening, or pericholecystic fluid. No biliary dilatation. Pancreas: No focal lesion. Normal pancreatic contour. No surrounding inflammatory changes. No main pancreatic ductal dilatation. Spleen: Normal in size without focal abnormality. Adrenals/Urinary Tract: No adrenal nodule bilaterally. Bilateral kidneys enhance symmetrically. No hydronephrosis. No hydroureter. The urinary bladder is unremarkable. On delayed imaging, there is no urothelial wall thickening and there are no filling defects in the opacified portions of the bilateral collecting systems or ureters. Stomach/Bowel: Stomach  is within normal limits. No evidence of bowel wall thickening or dilatation. Appendix appears normal. Vascular/Lymphatic: No abdominal aorta or iliac aneurysm. At least mild atherosclerotic plaque of the aorta and its branches. No abdominal, pelvic, or inguinal lymphadenopathy. Reproductive: Prostate is unremarkable. Other: No intraperitoneal free fluid. No intraperitoneal free gas. No organized fluid collection. Musculoskeletal: Tiny fat containing right inguinal hernia. No suspicious lytic or blastic osseous lesions. No acute displaced fracture. Old left L2 through L4 transverse process fractures. Multilevel degenerative changes of the spine. Mild retrolisthesis of L3 on L4. IMPRESSION: 1. No acute intra-abdominal or intrapelvic abnormality. No findings to suggest metastatic disease. 2. Tiny fat containing right inguinal hernia. 3. Interval increase in bilateral trace pleural effusions, left greater than right. 4.  Aortic Atherosclerosis (ICD10-I70.0). Electronically Signed   By: MIven FinnM.D.   On: 07/19/2021 20:04       Antibiotics: Anti-infectives (From admission, onward)    Start     Dose/Rate Route Frequency Ordered Stop  07/15/21 1000  amoxicillin-clavulanate (AUGMENTIN) 875-125 MG per tablet 1 tablet        1 tablet Oral Every 12 hours 07/15/21 0833 07/16/21 2146   07/13/21 1400  ampicillin-sulbactam (UNASYN) 1.5 g in sodium chloride 0.9 % 100 mL IVPB  Status:  Discontinued        1.5 g 200 mL/hr over 30 Minutes Intravenous Every 6 hours 07/13/21 1333 07/15/21 0833   07/13/21 1100  Ampicillin-Sulbactam (UNASYN) 3 g in sodium chloride 0.9 % 100 mL IVPB  Status:  Discontinued        3 g 200 mL/hr over 30 Minutes Intravenous Every 6 hours 07/13/21 1009 07/13/21 1339   07/13/21 1000  vancomycin (VANCOCIN) IVPB 1000 mg/200 mL premix  Status:  Discontinued        1,000 mg 200 mL/hr over 60 Minutes Intravenous Every 12 hours 07/12/21 2056 07/13/21 1010   07/13/21 0800   ampicillin-sulbactam (UNASYN) 1.5 g in sodium chloride 0.9 % 100 mL IVPB  Status:  Discontinued        1.5 g 200 mL/hr over 30 Minutes Intravenous Every 6 hours 07/13/21 0740 07/13/21 1009   07/13/21 0400  ceFEPIme (MAXIPIME) 2 g in sodium chloride 0.9 % 100 mL IVPB  Status:  Discontinued        2 g 200 mL/hr over 30 Minutes Intravenous Every 8 hours 07/12/21 2056 07/13/21 0740   07/12/21 1830  ceFEPIme (MAXIPIME) 2 g in sodium chloride 0.9 % 100 mL IVPB        2 g 200 mL/hr over 30 Minutes Intravenous  Once 07/12/21 1815 07/12/21 1954   07/12/21 1830  metroNIDAZOLE (FLAGYL) IVPB 500 mg        500 mg 100 mL/hr over 60 Minutes Intravenous  Once 07/12/21 1815 07/12/21 2030   07/12/21 1830  vancomycin (VANCOREADY) IVPB 1500 mg/300 mL        1,500 mg 150 mL/hr over 120 Minutes Intravenous  Once 07/12/21 1815 07/12/21 2251         DVT prophylaxis: Lovenox  Code Status: Full code  Family Communication: No family at bedside   Consultants:   Procedures:     Objective    Physical Examination:   General-appears in no acute distress Heart-S1-S2, regular, no murmur auscultated Lungs-clear to auscultation bilaterally, no wheezing or crackles auscultated Abdomen-soft, nontender, no organomegaly Extremities-no edema in the lower extremities Neuro-alert, oriented x3, no focal deficit noted  Status is: Inpatient  Dispo: The patient is from: Homeless              Anticipated d/c is to: Skilled nursing facility              Anticipated d/c date is: To be decided              Patient currently stable for discharge  Barrier to discharge-awaiting bed placement at skilled nursing facility  COVID-19 Labs  No results for input(s): DDIMER, FERRITIN, LDH, CRP in the last 72 hours.  Lab Results  Component Value Date   [place] NEGATIVE 07/12/2021            Recent Results (from the past 240 hour(s))  Resp Panel by RT-PCR (Flu A&B, Covid) Nasopharyngeal Swab      Status: None   Collection Time: 07/12/21  5:07 PM   Specimen: Nasopharyngeal Swab; Nasopharyngeal(NP) swabs in vial transport medium  Result Value Ref Range Status   SARS Coronavirus 2 by RT PCR NEGATIVE NEGATIVE Final    Comment: (  NOTE) SARS-CoV-2 target nucleic acids are NOT DETECTED.  The SARS-CoV-2 RNA is generally detectable in upper respiratory specimens during the acute phase of infection. The lowest concentration of SARS-CoV-2 viral copies this assay can detect is 138 copies/mL. A negative result does not preclude SARS-Cov-2 infection and should not be used as the sole basis for treatment or other patient management decisions. A negative result may occur with  improper specimen collection/handling, submission of specimen other than nasopharyngeal swab, presence of viral mutation(s) within the areas targeted by this assay, and inadequate number of viral copies(<138 copies/mL). A negative result must be combined with clinical observations, patient history, and epidemiological information. The expected result is Negative.  Fact Sheet for Patients:  EntrepreneurPulse.com.au  Fact Sheet for Healthcare Providers:  IncredibleEmployment.be  This test is no t yet approved or cleared by the [place] FDA and  has been authorized for detection and/or diagnosis of SARS-CoV-2 by FDA under an Emergency Use Authorization (EUA). This EUA will remain  in effect (meaning this test can be used) for the duration of the COVID-19 declaration under Section 564(b)(1) of the Act, 21 U.S.C.section 360bbb-3(b)(1), unless the authorization is terminated  or revoked sooner.       Influenza A by PCR NEGATIVE NEGATIVE Final   Influenza B by PCR NEGATIVE NEGATIVE Final    Comment: (NOTE) The Xpert Xpress SARS-CoV-2/FLU/RSV plus assay is intended as an aid in the diagnosis of influenza from Nasopharyngeal swab specimens and should not be used as a sole basis  for treatment. Nasal washings and aspirates are unacceptable for Xpert Xpress SARS-CoV-2/FLU/RSV testing.  Fact Sheet for Patients: EntrepreneurPulse.com.au  Fact Sheet for Healthcare Providers: IncredibleEmployment.be  This test is not yet approved or cleared by the [place] FDA and has been authorized for detection and/or diagnosis of SARS-CoV-2 by FDA under an Emergency Use Authorization (EUA). This EUA will remain in effect (meaning this test can be used) for the duration of the COVID-19 declaration under Section 564(b)(1) of the Act, 21 U.S.C. section 360bbb-3(b)(1), unless the authorization is terminated or revoked.  Performed at [facility], [place] [address], [place], [place] [address]   Blood culture (routine x 2)     Status: None   Collection Time: 07/12/21  6:43 PM   Specimen: BLOOD RIGHT HAND  Result Value Ref Range Status   Specimen Description BLOOD RIGHT HAND  Final   Special Requests   Final    BOTTLES DRAWN AEROBIC AND ANAEROBIC Blood Culture results may not be optimal due to an inadequate volume of blood received in culture bottles   Culture   Final    NO GROWTH 5 DAYS Performed at [facility], [place] [address], [place], [place] [address]    Report Status 07/17/2021 FINAL  Final  Culture, blood (Routine X 2) w Reflex to ID Panel     Status: None (Preliminary result)   Collection Time: 07/16/21 10:44 AM   Specimen: BLOOD  Result Value Ref Range Status   Specimen Description BLOOD BLOOD LEFT ARM  Final   Special Requests   Final    BOTTLES DRAWN AEROBIC AND ANAEROBIC Blood Culture adequate volume   Culture   Final    NO GROWTH 4 DAYS Performed at [facility], [place] [address] [place], [place] [address]    Report Status PENDING  Incomplete  Culture, blood (Routine X 2) w Reflex to ID Panel     Status: None (Preliminary result)   Collection  Time: 07/16/21 10:53 AM   Specimen: BLOOD  Result Value Ref  Range Status   Specimen Description BLOOD LEFT ANTECUBITAL  Final   Special Requests   Final    BOTTLES DRAWN AEROBIC AND ANAEROBIC Blood Culture adequate volume   Culture   Final    NO GROWTH 4 DAYS Performed at [facility], [place] [address] [place],  [address]    Report Status PENDING  Incomplete    Oswald Hillock   [facility] If 7PM-7AM, please contact night-coverage at [email or web address], Office  [phone]   07/20/2021, 3:25 PM  LOS: 8 days

## 2021-07-21 LAB — CULTURE, BLOOD (ROUTINE X 2)
Culture: NO GROWTH
Culture: NO GROWTH
Special Requests: ADEQUATE
Special Requests: ADEQUATE

## 2021-07-21 NOTE — Progress Notes (Signed)
PROGRESS NOTE    Dale Edwards  ZOX:[id number] DOB: [birth year] DOA: 07/12/2021 PCP: Elwyn Reach, MD   Chief Complain: Found on the ground  Brief Narrative: Patient is a 65 year old male with history of alcohol abuse, tobacco use, homelessness who presented to the emergency department after being found on the ground with laceration of the left forehead.  CT head/neck did not any acute intracranial abnormalities.  But showed multiple lucencies along the cervical and thoracic body.  He was also hypoxic on presentation, suspected to be from aspiration pneumonia.  Oncology consulted for bone lucencies, IR consulted for bone biopsy which could be done as an outpatient.  PT/OT recommending skilled nursing facility on discharge.  TOC following  Assessment & Plan:   Principal Problem:   Aspiration pneumonia (HCC) Active Problems:   Generalized weakness   Rhabdomyolysis   Traumatic hematoma of head   Abnormal finding on lung imaging   Abnormal CT scan, neck   Tobacco abuse   Alcohol abuse   Homeless   COPD (chronic obstructive pulmonary disease) (HCC)   Prolonged QT interval   Acute hypoxic respiratory failure: Presented with hypoxia, secondary to aspiration pneumonia.  Respiratory status has stabilized .  Patient states he has been chronically on 3L of Oxygen per minute. he completed antibiotics course for aspiration pneumonia.  Abnormal bone lucencies: Suspicious for malignancy.  Lucencies seen in cervical head, thoracic, cervical spine.  Concern for multiple myeloma.  Case was discussed with oncology, Dr. Lindi Adie who recommended getting CT abdomen/pelvis with contrast, PSA, SPEP.  PSA is normal.  CT abdomen/pelvis did not show any abnormality.  SPEP is pending.  Kappa/lambda light chain negative.  Oncology recommended for biopsy of these lesions.  IR consulted, biopsy will be done as an outpatient or inpatient while he waits for SNF placement  Chronic alcohol abuse: Currently not in  withdrawal.  Continue thiamine, folic acid.  Smoker,smokes 1.5 packs a day, currently on nicotine patch  Traumatic laceration of the scalp: Repaired by ED provider  Homelessness: Education officer, museum following.  Plan is to discharge to SNF  Prolonged WJX:BJYN Qtc of 538.  We will check new QTC  Rhabdomyolysis: Resolved with IV fluids           DVT prophylaxis:Lovenox Code Status: Full Family Communication: None at bedside Patient status:Inpatient  Dispo: The patient is from:Homeless              Anticipated d/c is to: SNF              Anticipated d/c date is: SNF  Consultants: Oncology  Procedures:None  Antimicrobials:  Anti-infectives (From admission, onward)    Start     Dose/Rate Route Frequency Ordered Stop   07/15/21 1000  amoxicillin-clavulanate (AUGMENTIN) 875-125 MG per tablet 1 tablet        1 tablet Oral Every 12 hours 07/15/21 0833 07/16/21 2146   07/13/21 1400  ampicillin-sulbactam (UNASYN) 1.5 g in sodium chloride 0.9 % 100 mL IVPB  Status:  Discontinued        1.5 g 200 mL/hr over 30 Minutes Intravenous Every 6 hours 07/13/21 1333 07/15/21 0833   07/13/21 1100  Ampicillin-Sulbactam (UNASYN) 3 g in sodium chloride 0.9 % 100 mL IVPB  Status:  Discontinued        3 g 200 mL/hr over 30 Minutes Intravenous Every 6 hours 07/13/21 1009 07/13/21 1339   07/13/21 1000  vancomycin (VANCOCIN) IVPB 1000 mg/200 mL premix  Status:  Discontinued  1,000 mg 200 mL/hr over 60 Minutes Intravenous Every 12 hours 07/12/21 2056 07/13/21 1010   07/13/21 0800  ampicillin-sulbactam (UNASYN) 1.5 g in sodium chloride 0.9 % 100 mL IVPB  Status:  Discontinued        1.5 g 200 mL/hr over 30 Minutes Intravenous Every 6 hours 07/13/21 0740 07/13/21 1009   07/13/21 0400  ceFEPIme (MAXIPIME) 2 g in sodium chloride 0.9 % 100 mL IVPB  Status:  Discontinued        2 g 200 mL/hr over 30 Minutes Intravenous Every 8 hours 07/12/21 2056 07/13/21 0740   07/12/21 1830  ceFEPIme (MAXIPIME) 2 g in  sodium chloride 0.9 % 100 mL IVPB        2 g 200 mL/hr over 30 Minutes Intravenous  Once 07/12/21 1815 07/12/21 1954   07/12/21 1830  metroNIDAZOLE (FLAGYL) IVPB 500 mg        500 mg 100 mL/hr over 60 Minutes Intravenous  Once 07/12/21 1815 07/12/21 2030   07/12/21 1830  vancomycin (VANCOREADY) IVPB 1500 mg/300 mL        1,500 mg 150 mL/hr over 120 Minutes Intravenous  Once 07/12/21 1815 07/12/21 2251       Subjective: Patient seen and examined the bedside this morning.  Hemodynamically stable.  Comfortable.  On 3 L of oxygen per minute, lying in bed.  Denies any new complaints except for back pain  Objective: Vitals:   07/20/21 1226 07/20/21 2011 07/20/21 2214 07/21/21 0747  BP: 97/65 (!) 91/51 117/73 100/67  Pulse: 67 73 73 68  Resp: _0 Temp: 98.4 F (36.9 C) 98.1 F (36.7 C)  98.6 F (37 C)  TempSrc: Oral Oral  Oral  SpO2:  94%  98%  Weight: 65.8 kg     Height: _1  (1.676 m)       Intake/Output Summary (Last 24 hours) at 07/21/2021 1117 Last data filed at 07/21/2021 0531 Gross per 24 hour  Intake 240 ml  Output 800 ml  Net -560 ml   Filed Weights   07/13/21 0744 07/20/21 1226  Weight: 72.6 kg 65.8 kg    Examination:  General exam: Overall comfortable, not in distress,appears deconditioned HEENT: PERRL Respiratory system:  no wheezes or crackles  Cardiovascular system: S1 & S2 heard, RRR.  Gastrointestinal system: Abdomen is nondistended, soft and nontender. Central nervous system: Alert and oriented Extremities: No edema, no clubbing ,no cyanosis Skin: No rashes, no ulcers,no icterus      Data Reviewed: I have personally reviewed following labs and imaging studies  CBC: Recent Labs  Lab 07/16/21 1001  WBC 7.3  NEUTROABS 4.8  HGB 14.1  HCT 40.7  MCV 94.4  PLT 559   Basic Metabolic Panel: No results for input(s): NA, K, CL, CO2, GLUCOSE, BUN, CREATININE, CALCIUM, MG, PHOS in the last 168 hours. GFR: Estimated Creatinine Clearance:  82.2 mL/min (by C-G formula based on SCr of 0.83 mg/dL). Liver Function Tests: No results for input(s): AST, ALT, ALKPHOS, BILITOT, PROT, ALBUMIN in the last 168 hours. No results for input(s): LIPASE, AMYLASE in the last 168 hours. No results for input(s): AMMONIA in the last 168 hours. Coagulation Profile: No results for input(s): INR, PROTIME in the last 168 hours. Cardiac Enzymes: Recent Labs  Lab 07/20/21 1559  CKTOTAL 42*   BNP (last 3 results) No results for input(s): PROBNP in the last 8760 hours. HbA1C: No results for input(s): HGBA1C in the last 72 hours. CBG: No results  for input(s): GLUCAP in the last 168 hours. Lipid Profile: No results for input(s): CHOL, HDL, LDLCALC, TRIG, CHOLHDL, LDLDIRECT in the last 72 hours. Thyroid Function Tests: No results for input(s): TSH, T4TOTAL, FREET4, T3FREE, THYROIDAB in the last 72 hours. Anemia Panel: No results for input(s): VITAMINB12, FOLATE, FERRITIN, TIBC, IRON, RETICCTPCT in the last 72 hours. Sepsis Labs: No results for input(s): PROCALCITON, LATICACIDVEN in the last 168 hours.  Recent Results (from the past 240 hour(s))  Resp Panel by RT-PCR (Flu A&B, Covid) Nasopharyngeal Swab     Status: None   Collection Time: 07/12/21  5:07 PM   Specimen: Nasopharyngeal Swab; Nasopharyngeal(NP) swabs in vial transport medium  Result Value Ref Range Status   SARS Coronavirus 2 by RT PCR NEGATIVE NEGATIVE Final    Comment: (NOTE) SARS-CoV-2 target nucleic acids are NOT DETECTED.  The SARS-CoV-2 RNA is generally detectable in upper respiratory specimens during the acute phase of infection. The lowest concentration of SARS-CoV-2 viral copies this assay can detect is 138 copies/mL. A negative result does not preclude SARS-Cov-2 infection and should not be used as the sole basis for treatment or other patient management decisions. A negative result may occur with  improper specimen collection/handling, submission of specimen  other than nasopharyngeal swab, presence of viral mutation(s) within the areas targeted by this assay, and inadequate number of viral copies(<138 copies/mL). A negative result must be combined with clinical observations, patient history, and epidemiological information. The expected result is Negative.  Fact Sheet for Patients:  EntrepreneurPulse.com.au  Fact Sheet for Healthcare Providers:  IncredibleEmployment.be  This test is no t yet approved or cleared by the [place] FDA and  has been authorized for detection and/or diagnosis of SARS-CoV-2 by FDA under an Emergency Use Authorization (EUA). This EUA will remain  in effect (meaning this test can be used) for the duration of the COVID-19 declaration under Section 564(b)(1) of the Act, 21 U.S.C.section 360bbb-3(b)(1), unless the authorization is terminated  or revoked sooner.       Influenza A by PCR NEGATIVE NEGATIVE Final   Influenza B by PCR NEGATIVE NEGATIVE Final    Comment: (NOTE) The Xpert Xpress SARS-CoV-2/FLU/RSV plus assay is intended as an aid in the diagnosis of influenza from Nasopharyngeal swab specimens and should not be used as a sole basis for treatment. Nasal washings and aspirates are unacceptable for Xpert Xpress SARS-CoV-2/FLU/RSV testing.  Fact Sheet for Patients: EntrepreneurPulse.com.au  Fact Sheet for Healthcare Providers: IncredibleEmployment.be  This test is not yet approved or cleared by the [place] FDA and has been authorized for detection and/or diagnosis of SARS-CoV-2 by FDA under an Emergency Use Authorization (EUA). This EUA will remain in effect (meaning this test can be used) for the duration of the COVID-19 declaration under Section 564(b)(1) of the Act, 21 U.S.C. section 360bbb-3(b)(1), unless the authorization is terminated or revoked.  Performed at [facility], [place] [address] [place],  [place] [address]   Blood culture (routine x 2)     Status: None   Collection Time: 07/12/21  6:43 PM   Specimen: BLOOD RIGHT HAND  Result Value Ref Range Status   Specimen Description BLOOD RIGHT HAND  Final   Special Requests   Final    BOTTLES DRAWN AEROBIC AND ANAEROBIC Blood Culture results may not be optimal due to an inadequate volume of blood received in culture bottles   Culture   Final    NO GROWTH 5 DAYS Performed at [facility],  [address] [place], [place] [address]    Report Status 07/17/2021 FINAL  Final  Culture, blood (Routine X 2) w Reflex to ID Panel     Status: None   Collection Time: 07/16/21 10:44 AM   Specimen: BLOOD  Result Value Ref Range Status   Specimen Description BLOOD BLOOD LEFT ARM  Final   Special Requests   Final    BOTTLES DRAWN AEROBIC AND ANAEROBIC Blood Culture adequate volume   Culture   Final    NO GROWTH 5 DAYS Performed at [facility], [place] [address], [place], [place] [address]    Report Status 07/21/2021 FINAL  Final  Culture, blood (Routine X 2) w Reflex to ID Panel     Status: None   Collection Time: 07/16/21 10:53 AM   Specimen: BLOOD  Result Value Ref Range Status   Specimen Description BLOOD LEFT ANTECUBITAL  Final   Special Requests   Final    BOTTLES DRAWN AEROBIC AND ANAEROBIC Blood Culture adequate volume   Culture   Final    NO GROWTH 5 DAYS Performed at [facility], [place] [address] [place], [place] [address]    Report Status 07/21/2021 FINAL  Final         Radiology Studies: CT ABDOMEN PELVIS W CONTRAST  Result Date: 07/19/2021 CLINICAL DATA:  Metastatic disease evaluation. Abnormal lucencies -Noted on clavicle head, thoracic and cervical spine -Concern for multiple myeloma - EXAM: CT ABDOMEN AND PELVIS WITH CONTRAST TECHNIQUE: Multidetector CT imaging of the abdomen and pelvis was performed using the standard protocol following bolus administration of intravenous contrast. CONTRAST:  167m  OMNIPAQUE IOHEXOL 300 MG/ML  SOLN COMPARISON:  CT abdomen pelvis 07/12/2021 FINDINGS: Lower chest: Interval increase in bilateral trace pleural effusions, left greater than right. Redemonstration of bibasilar interlobular wall septal thickening and ground-glass airspace opacities. Hepatobiliary: Couple calcifications within the hepatic parenchyma are nonspecific. No focal liver abnormality. No gallstones, gallbladder wall thickening, or pericholecystic fluid. No biliary dilatation. Pancreas: No focal lesion. Normal pancreatic contour. No surrounding inflammatory changes. No main pancreatic ductal dilatation. Spleen: Normal in size without focal abnormality. Adrenals/Urinary Tract: No adrenal nodule bilaterally. Bilateral kidneys enhance symmetrically. No hydronephrosis. No hydroureter. The urinary bladder is unremarkable. On delayed imaging, there is no urothelial wall thickening and there are no filling defects in the opacified portions of the bilateral collecting systems or ureters. Stomach/Bowel: Stomach is within normal limits. No evidence of bowel wall thickening or dilatation. Appendix appears normal. Vascular/Lymphatic: No abdominal aorta or iliac aneurysm. At least mild atherosclerotic plaque of the aorta and its branches. No abdominal, pelvic, or inguinal lymphadenopathy. Reproductive: Prostate is unremarkable. Other: No intraperitoneal free fluid. No intraperitoneal free gas. No organized fluid collection. Musculoskeletal: Tiny fat containing right inguinal hernia. No suspicious lytic or blastic osseous lesions. No acute displaced fracture. Old left L2 through L4 transverse process fractures. Multilevel degenerative changes of the spine. Mild retrolisthesis of L3 on L4. IMPRESSION: 1. No acute intra-abdominal or intrapelvic abnormality. No findings to suggest metastatic disease. 2. Tiny fat containing right inguinal hernia. 3. Interval increase in bilateral trace pleural effusions, left greater than right.  4.  Aortic Atherosclerosis (ICD10-I70.0). Electronically Signed   By: MIven FinnM.D.   On: 07/19/2021 20:04        Scheduled Meds:  enoxaparin (LOVENOX) injection  40 mg Subcutaneous [id number]  folic acid  1 mg Oral Daily   metoprolol tartrate  12.5 mg Oral BID   multivitamin  with minerals  1 tablet Oral Daily   multivitamin with minerals  1 tablet Oral Daily   nicotine  14 mg Transdermal Daily   thiamine  100 mg Oral Daily   Or   thiamine  100 mg Intravenous Daily   thiamine  100 mg Oral Daily   Or   thiamine  100 mg Intravenous Daily   Continuous Infusions:   LOS: 9 days    Time spent:25 mins. More than 50% of that time was spent in counseling and/or coordination of care.      Shelly Coss, MD [facility] P12/24/2022, 11:17 AM

## 2021-07-21 NOTE — Plan of Care (Signed)

## 2021-07-22 NOTE — Plan of Care (Signed)

## 2021-07-22 NOTE — Progress Notes (Signed)
PROGRESS NOTE    Dale Edwards  TGG:[id number] DOB: 08-27-1955 DOA: 07/12/2021 PCP: Elwyn Reach, MD   Chief Complain: Found on the ground  Brief Narrative: Patient is a 65 year old male with history of alcohol abuse, tobacco use, homelessness who presented to the emergency department after being found on the ground with laceration of the left forehead.  CT head/neck did not any acute intracranial abnormalities.  But showed multiple lucencies along the cervical and thoracic body.  He was also hypoxic on presentation, suspected to be from aspiration pneumonia.  Oncology consulted for bone lucencies, IR consulted for bone biopsy which could be done as an outpatient.  PT/OT recommending skilled nursing facility on discharge.  TOC following  Assessment & Plan:   Principal Problem:   Aspiration pneumonia (HCC) Active Problems:   Generalized weakness   Rhabdomyolysis   Traumatic hematoma of head   Abnormal finding on lung imaging   Abnormal CT scan, neck   Tobacco abuse   Alcohol abuse   Homeless   COPD (chronic obstructive pulmonary disease) (HCC)   Prolonged QT interval   Acute hypoxic respiratory failure: Presented with hypoxia, secondary to aspiration pneumonia.  Respiratory status has stabilized .  Patient states he has been chronically on 3L of Oxygen per minute. he completed antibiotics course for aspiration pneumonia.  Abnormal bone lucencies: Suspicious for malignancy.  Lucencies seen in cervical head, thoracic, cervical spine.  Concern for multiple myeloma.  Case was discussed with oncology, Dr. Lindi Adie who recommended getting CT abdomen/pelvis with contrast, PSA, SPEP.  PSA is normal.  CT abdomen/pelvis did not show any abnormality.  SPEP is pending.  Kappa/lambda light chain negative.  Oncology recommended for biopsy of these lesions.  IR consulted, biopsy will be done as an outpatient or inpatient while he waits for SNF placement  Chronic alcohol abuse: Currently not in  withdrawal.  Continue thiamine, folic acid.  Smoker,smokes 1.5 packs a day, currently on nicotine patch  Traumatic laceration of the scalp: Repaired by ED provider  Homelessness: Education officer, museum following.  Plan is to discharge to SNF  Prolonged VOJ:JKKX Qtc of 538.  New ekg showed Qtc of 461  Rhabdomyolysis: Resolved with IV fluids           DVT prophylaxis:Lovenox Code Status: Full Family Communication: None at bedside Patient status:Inpatient  Dispo: The patient is from:Homeless              Anticipated d/c is to: SNF              Anticipated d/c date is: SNF  Consultants: Oncology  Procedures:None  Antimicrobials:  Anti-infectives (From admission, onward)    Start     Dose/Rate Route Frequency Ordered Stop   07/15/21 1000  amoxicillin-clavulanate (AUGMENTIN) 875-125 MG per tablet 1 tablet        1 tablet Oral Every 12 hours 07/15/21 0833 07/16/21 2146   07/13/21 1400  ampicillin-sulbactam (UNASYN) 1.5 g in sodium chloride 0.9 % 100 mL IVPB  Status:  Discontinued        1.5 g 200 mL/hr over 30 Minutes Intravenous Every 6 hours 07/13/21 1333 07/15/21 0833   07/13/21 1100  Ampicillin-Sulbactam (UNASYN) 3 g in sodium chloride 0.9 % 100 mL IVPB  Status:  Discontinued        3 g 200 mL/hr over 30 Minutes Intravenous Every 6 hours 07/13/21 1009 07/13/21 1339   07/13/21 1000  vancomycin (VANCOCIN) IVPB 1000 mg/200 mL premix  Status:  Discontinued  1,000 mg 200 mL/hr over 60 Minutes Intravenous Every 12 hours 07/12/21 2056 07/13/21 1010   07/13/21 0800  ampicillin-sulbactam (UNASYN) 1.5 g in sodium chloride 0.9 % 100 mL IVPB  Status:  Discontinued        1.5 g 200 mL/hr over 30 Minutes Intravenous Every 6 hours 07/13/21 0740 07/13/21 1009   07/13/21 0400  ceFEPIme (MAXIPIME) 2 g in sodium chloride 0.9 % 100 mL IVPB  Status:  Discontinued        2 g 200 mL/hr over 30 Minutes Intravenous Every 8 hours 07/12/21 2056 07/13/21 0740   07/12/21 1830  ceFEPIme (MAXIPIME) 2 g  in sodium chloride 0.9 % 100 mL IVPB        2 g 200 mL/hr over 30 Minutes Intravenous  Once 07/12/21 1815 07/12/21 1954   07/12/21 1830  metroNIDAZOLE (FLAGYL) IVPB 500 mg        500 mg 100 mL/hr over 60 Minutes Intravenous  Once 07/12/21 1815 07/12/21 2030   07/12/21 1830  vancomycin (VANCOREADY) IVPB 1500 mg/300 mL        1,500 mg 150 mL/hr over 120 Minutes Intravenous  Once 07/12/21 1815 07/12/21 2251       Subjective: Patient seen and examined at bedside this morning.  Hemodynamically stable.  Lying on bed.  Denies any new complaints  Objective: Vitals:   07/21/21 0747 07/21/21 1224 07/21/21 2100 07/22/21 0806  BP: 100/67 103/62 109/72 103/73  Pulse: 68 (!) 56 70 70  Resp: _0 Temp: 98.6 F (37 C) 98.4 F (36.9 C) (!) 97.5 F (36.4 C) 97.9 F (36.6 C)  TempSrc: Oral Oral Oral Oral  SpO2: 98% 98% 97% 97%  Weight:      Height:        Intake/Output Summary (Last 24 hours) at 07/22/2021 1254 Last data filed at 07/22/2021 0730 Gross per 24 hour  Intake 200 ml  Output 1500 ml  Net -1300 ml   Filed Weights   07/13/21 0744 07/20/21 1226  Weight: 72.6 kg 65.8 kg    Examination:  General exam: Overall comfortable, not in distress, deconditioned, chronically ill looking HEENT: PERRL Respiratory system:  no wheezes or crackles  Cardiovascular system: S1 & S2 heard, RRR.  Gastrointestinal system: Abdomen is nondistended, soft and nontender. Central nervous system: Alert and oriented Extremities: No edema, no clubbing ,no cyanosis Skin: No rashes, no ulcers,no icterus        Data Reviewed: I have personally reviewed following labs and imaging studies  CBC: Recent Labs  Lab 07/16/21 1001  WBC 7.3  NEUTROABS 4.8  HGB 14.1  HCT 40.7  MCV 94.4  PLT 062   Basic Metabolic Panel: No results for input(s): NA, K, CL, CO2, GLUCOSE, BUN, CREATININE, CALCIUM, MG, PHOS in the last 168 hours. GFR: Estimated Creatinine Clearance: 82.2 mL/min (by C-G formula  based on SCr of 0.83 mg/dL). Liver Function Tests: No results for input(s): AST, ALT, ALKPHOS, BILITOT, PROT, ALBUMIN in the last 168 hours. No results for input(s): LIPASE, AMYLASE in the last 168 hours. No results for input(s): AMMONIA in the last 168 hours. Coagulation Profile: No results for input(s): INR, PROTIME in the last 168 hours. Cardiac Enzymes: Recent Labs  Lab 07/20/21 1559  CKTOTAL 42*   BNP (last 3 results) No results for input(s): PROBNP in the last 8760 hours. HbA1C: No results for input(s): HGBA1C in the last 72 hours. CBG: No results for input(s): GLUCAP in the last 168 hours.  Lipid Profile: No results for input(s): CHOL, HDL, LDLCALC, TRIG, CHOLHDL, LDLDIRECT in the last 72 hours. Thyroid Function Tests: No results for input(s): TSH, T4TOTAL, FREET4, T3FREE, THYROIDAB in the last 72 hours. Anemia Panel: No results for input(s): VITAMINB12, FOLATE, FERRITIN, TIBC, IRON, RETICCTPCT in the last 72 hours. Sepsis Labs: No results for input(s): PROCALCITON, LATICACIDVEN in the last 168 hours.  Recent Results (from the past 240 hour(s))  Resp Panel by RT-PCR (Flu A&B, Covid) Nasopharyngeal Swab     Status: None   Collection Time: 07/12/21  5:07 PM   Specimen: Nasopharyngeal Swab; Nasopharyngeal(NP) swabs in vial transport medium  Result Value Ref Range Status   SARS Coronavirus 2 by RT PCR NEGATIVE NEGATIVE Final    Comment: (NOTE) SARS-CoV-2 target nucleic acids are NOT DETECTED.  The SARS-CoV-2 RNA is generally detectable in upper respiratory specimens during the acute phase of infection. The lowest concentration of SARS-CoV-2 viral copies this assay can detect is 138 copies/mL. A negative result does not preclude SARS-Cov-2 infection and should not be used as the sole basis for treatment or other patient management decisions. A negative result may occur with  improper specimen collection/handling, submission of specimen other than nasopharyngeal swab,  presence of viral mutation(s) within the areas targeted by this assay, and inadequate number of viral copies(<138 copies/mL). A negative result must be combined with clinical observations, patient history, and epidemiological information. The expected result is Negative.  Fact Sheet for Patients:  EntrepreneurPulse.com.au  Fact Sheet for Healthcare Providers:  IncredibleEmployment.be  This test is no t yet approved or cleared by the [place] FDA and  has been authorized for detection and/or diagnosis of SARS-CoV-2 by FDA under an Emergency Use Authorization (EUA). This EUA will remain  in effect (meaning this test can be used) for the duration of the COVID-19 declaration under Section 564(b)(1) of the Act, 21 U.S.C.section 360bbb-3(b)(1), unless the authorization is terminated  or revoked sooner.       Influenza A by PCR NEGATIVE NEGATIVE Final   Influenza B by PCR NEGATIVE NEGATIVE Final    Comment: (NOTE) The Xpert Xpress SARS-CoV-2/FLU/RSV plus assay is intended as an aid in the diagnosis of influenza from Nasopharyngeal swab specimens and should not be used as a sole basis for treatment. Nasal washings and aspirates are unacceptable for Xpert Xpress SARS-CoV-2/FLU/RSV testing.  Fact Sheet for Patients: EntrepreneurPulse.com.au  Fact Sheet for Healthcare Providers: IncredibleEmployment.be  This test is not yet approved or cleared by the [place] FDA and has been authorized for detection and/or diagnosis of SARS-CoV-2 by FDA under an Emergency Use Authorization (EUA). This EUA will remain in effect (meaning this test can be used) for the duration of the COVID-19 declaration under Section 564(b)(1) of the Act, 21 U.S.C. section 360bbb-3(b)(1), unless the authorization is terminated or revoked.  Performed at [facility], [place] [address], [place], [place] [address]   Blood culture  (routine x 2)     Status: None   Collection Time: 07/12/21  6:43 PM   Specimen: BLOOD RIGHT HAND  Result Value Ref Range Status   Specimen Description BLOOD RIGHT HAND  Final   Special Requests   Final    BOTTLES DRAWN AEROBIC AND ANAEROBIC Blood Culture results may not be optimal due to an inadequate volume of blood received in culture bottles   Culture   Final    NO GROWTH 5 DAYS Performed at [facility], [place] [address] [place], [place] [address]  Report Status 07/17/2021 FINAL  Final  Culture, blood (Routine X 2) w Reflex to ID Panel     Status: None   Collection Time: 07/16/21 10:44 AM   Specimen: BLOOD  Result Value Ref Range Status   Specimen Description BLOOD BLOOD LEFT ARM  Final   Special Requests   Final    BOTTLES DRAWN AEROBIC AND ANAEROBIC Blood Culture adequate volume   Culture   Final    NO GROWTH 5 DAYS Performed at [facility], [place] [address], [place], [place] [address]    Report Status 07/21/2021 FINAL  Final  Culture, blood (Routine X 2) w Reflex to ID Panel     Status: None   Collection Time: 07/16/21 10:53 AM   Specimen: BLOOD  Result Value Ref Range Status   Specimen Description BLOOD LEFT ANTECUBITAL  Final   Special Requests   Final    BOTTLES DRAWN AEROBIC AND ANAEROBIC Blood Culture adequate volume   Culture   Final    NO GROWTH 5 DAYS Performed at [facility], [place] [address], [place], [place] [address]    Report Status 07/21/2021 FINAL  Final         Radiology Studies: No results found.      Scheduled Meds:  enoxaparin (LOVENOX) injection  40 mg Subcutaneous X93Z   folic acid  1 mg Oral Daily   metoprolol tartrate  12.5 mg Oral BID   multivitamin with minerals  1 tablet Oral Daily   multivitamin with minerals  1 tablet Oral Daily   nicotine  14 mg Transdermal Daily   thiamine  100 mg Oral Daily   Or   thiamine  100 mg Intravenous Daily   thiamine  100 mg Oral Daily   Or   thiamine  100 mg Intravenous Daily    Continuous Infusions:   LOS: 10 days    Time spent:25 mins. More than 50% of that time was spent in counseling and/or coordination of care.      Shelly Coss, MD [facility] P12/25/2022, 12:54 PM

## 2021-07-23 ENCOUNTER — Inpatient Hospital Stay (HOSPITAL_COMMUNITY): Payer: Medicare Other

## 2021-07-23 MED ORDER — NICOTINE 21 MG/24HR TD PT24
21.0000 mg | MEDICATED_PATCH | Freq: Every day | TRANSDERMAL | Status: DC
  Administered 2021-07-23 – 2021-07-27 (×5): 21 mg via TRANSDERMAL
  Filled 2021-07-23 (×5): qty 1

## 2021-07-23 MED ORDER — ONDANSETRON HCL 4 MG/2ML IJ SOLN
4.0000 mg | Freq: Four times a day (QID) | INTRAMUSCULAR | Status: DC | PRN
  Administered 2021-07-23: 15:00:00 4 mg via INTRAVENOUS
  Filled 2021-07-23: qty 2

## 2021-07-23 MED ORDER — OXYCODONE HCL 5 MG PO TABS
5.0000 mg | ORAL_TABLET | Freq: Four times a day (QID) | ORAL | Status: DC | PRN
  Administered 2021-07-23 – 2021-07-27 (×7): 5 mg via ORAL
  Filled 2021-07-23 (×7): qty 1

## 2021-07-23 NOTE — Plan of Care (Signed)
°  Problem: Activity: Goal: Risk for activity intolerance will decrease Outcome: Progressing   Problem: Coping: Goal: Level of anxiety will decrease Outcome: Progressing   Problem: Pain Managment: Goal: General experience of comfort will improve Outcome: Adequate for Discharge   Problem: Safety: Goal: Ability to remain free from injury will improve Outcome: Progressing   Problem: Skin Integrity: Goal: Risk for impaired skin integrity will decrease Outcome: Progressing

## 2021-07-23 NOTE — Plan of Care (Signed)
  Problem: Pain Managment: Goal: General experience of comfort will improve Outcome: Progressing   Problem: Safety: Goal: Ability to remain free from injury will improve Outcome: Progressing   Problem: Skin Integrity: Goal: Risk for impaired skin integrity will decrease Outcome: Progressing   

## 2021-07-23 NOTE — Progress Notes (Signed)
Mobility Specialist Criteria Algorithm Info.   07/23/21 1030  Mobility  Activity Ambulated in room;Ambulated to bathroom;Dangled on edge of bed (Declined hallway ambulation)  Range of Motion/Exercises Active;All extremities  Level of Assistance Minimal assist, patient does 75% or more  Assistive Device Front wheel walker  Distance Ambulated (ft) 35 ft  Mobility Ambulated with assistance in room  Mobility Response Tolerated well  Mobility performed by Mobility specialist  Bed Position Semi-fowlers   Patient received in bed asleep. Initially unwilling to participate but agreed to ambulate in room to bathroom. Required minimal A to stand and ambulated to restroom min guard. Deferred further mobility or sitting in recliner. Returned to bed without complaint or incident. Was left dangling EOB with all needs met, call bell in reach.    07/23/2021 3:33 PM

## 2021-07-23 NOTE — Progress Notes (Signed)
Physical Therapy Treatment Patient Details Name: Dale Edwards MRN: [id number] DOB: [birth year] Today's Date: 07/23/2021   History of Present Illness Pt is a 65 y/o male admitted after beeing found down. Has laceration on head that was closed in ED. Also found to have Rhabdomyolysis. PMH includes MS, COPD, alcohol abuse, tobacco abuse.    PT Comments    Pt remains limited by fatigue, reporting SOB when ambulating on 6L [place]. Pt does report improved confidence when mobilizing with use of 4 wheeled walker with seat. Social work reports no current bed offers, if the patient must be discharged to the community then the patient will benefit from having a 4 wheeled walker with seat to aide in energy conservation. Pt will benefit from continued acute PT services to aide in restoring independence.   Recommendations for follow up therapy are one component of a multi-disciplinary discharge planning process, led by the attending physician.  Recommendations may be updated based on patient status, additional functional criteria and insurance authorization.  Follow Up Recommendations  Outpatient PT (PT recommends SNF however no bed offers per social worker)     Assistance Recommended at Discharge Intermittent Supervision/Assistance  Equipment Recommendations  Rollator (4 wheels) (likely needs supplemental oxygen)    Recommendations for Other Services       Precautions / Restrictions Precautions Precautions: Fall;Other (comment) Precaution Comments: Monitor O2 Restrictions Weight Bearing Restrictions: No     Mobility  Bed Mobility Overal bed mobility: Modified Independent Bed Mobility: Supine to Sit     Supine to sit: Modified independent (Device/Increase time)          Transfers Overall transfer level: Needs assistance Equipment used: Rollator (4 wheels) Transfers: Sit to/from Stand Sit to Stand: Supervision                Ambulation/Gait Ambulation/Gait assistance:  Supervision Gait Distance (Feet): 100 Feet (100' x 2) Assistive device: Rollator (4 wheels) Gait Pattern/deviations: Step-through pattern Gait velocity: functional Gait velocity interpretation: 1.31 - 2.62 ft/sec, indicative of limited community ambulator   General Gait Details: pt with step-through gait, no significant losses of balance noted with UE support of 4 wheeled walker   Stairs             Wheelchair Mobility    Modified Rankin (Stroke Patients Only)       Balance Overall balance assessment: Needs assistance Sitting-balance support: No upper extremity supported;Feet supported Sitting balance-Leahy Scale: Normal     Standing balance support: Bilateral upper extremity supported;Single extremity supported;Reliant on assistive device for balance Standing balance-Leahy Scale: Poor Standing balance comment: Reliant on UE support of assistive device                            Cognition Arousal/Alertness: Awake/alert Behavior During Therapy: WFL for tasks assessed/performed Overall Cognitive Status: Within Functional Limits for tasks assessed                                          Exercises      General Comments General comments (skin integrity, edema, etc.): pt on 3L [place] upon PT arrival, pt reports significant DOE and SOB with ambulation while on 3L [place], PT increases supplemental oxygen to 6L [place]. Pt requires multiple minutes of rest on 6L [place] to recover RR to baseline and report resolution of SOB. Later in session pt  desats to 89% at rest on room air, again desaturates when ambulating on supplemental oxygen, and requires 6L [place] to achieve sats of 87% when mobilizing. Pt in NAD at rest on 2L [place]      Pertinent Vitals/Pain Pain Assessment: Faces Faces Pain Scale: Hurts a little bit Pain Location: generalized Pain Descriptors / Indicators: Grimacing Pain Intervention(s): Monitored during session    Home Living                           Prior Function            PT Goals (current goals can now be found in the care plan section) Acute Rehab PT Goals Patient Stated Goal: to feel better Progress towards PT goals: Progressing toward goals    Frequency    Min 3X/week      PT Plan Current plan remains appropriate    Co-evaluation              AM-PAC PT "6 Clicks" Mobility   Outcome Measure  Help needed turning from your back to your side while in a flat bed without using bedrails?: None Help needed moving from lying on your back to sitting on the side of a flat bed without using bedrails?: None Help needed moving to and from a bed to a chair (including a wheelchair)?: A Little Help needed standing up from a chair using your arms (e.g., wheelchair or bedside chair)?: A Little Help needed to walk in hospital room?: A Little Help needed climbing 3-5 steps with a railing? : A Little 6 Click Score: 20    End of Session Equipment Utilized During Treatment: Oxygen Activity Tolerance: Patient limited by fatigue Patient left: in bed;with call bell/phone within reach Nurse Communication: Mobility status PT Visit Diagnosis: Unsteadiness on feet (R26.81);Muscle weakness (generalized) (M62.81)     Time: 2423-5361 PT Time Calculation (min) (ACUTE ONLY): 33 min  Charges:  $Gait Training: 23-37 mins                     Arlyss Gandy, PT, DPT Acute Rehabilitation Pager: [phone] Office [phone]    Arlyss Gandy 07/23/2021, 2:38 PM

## 2021-07-23 NOTE — Progress Notes (Signed)
SATURATION QUALIFICATIONS: (This note is used to comply with regulatory documentation for home oxygen)  Patient Saturations on Room Air at Rest = 89%  Patient Saturations on Room Air while Ambulating = 81%  Patient Saturations on 6 Liters of oxygen while Ambulating = 87%  Please briefly explain why patient needs home oxygen: The patient desaturates rapidly when mobilizing without supplemental oxygen and reports significant shortness of breath. With use of oxygen patient demonstrates some improvement in activity tolerance. As this patient is homeless he is likely to have to mobilize for community distances and will benefit from supplemental oxygen to aide in improving his tolerance for extended periods of mobility.

## 2021-07-23 NOTE — Plan of Care (Signed)
°  Problem: Education: Goal: Knowledge of General Education information will improve Description: Including pain rating scale, medication(s)/side effects and non-pharmacologic comfort measures Outcome: Progressing   Problem: Activity: Goal: Risk for activity intolerance will decrease Outcome: Progressing   Problem: Pain Managment: Goal: General experience of comfort will improve 07/23/2021 0051 by Charlotte Sanes, RN Outcome: Progressing 07/23/2021 0050 by Charlotte Sanes, RN Outcome: Progressing   Problem: Safety: Goal: Ability to remain free from injury will improve 07/23/2021 0051 by Charlotte Sanes, RN Outcome: Progressing 07/23/2021 0050 by Charlotte Sanes, RN Outcome: Progressing   Problem: Skin Integrity: Goal: Risk for impaired skin integrity will decrease 07/23/2021 0051 by Charlotte Sanes, RN Outcome: Progressing 07/23/2021 0050 by Charlotte Sanes, RN Outcome: Progressing

## 2021-07-23 NOTE — TOC Progression Note (Signed)
Transition of Care [facility]) - Progression Note    Patient Details  Name: Kasch Borquez MRN: [id number] Date of Birth: 1956/03/30  [facility] CM/SW Contact  Lorri Frederick, LCSW Phone Number: 07/23/2021, 2:59 PM  Clinical Narrative:   CSW spoke with pt regarding alternate plan from SNF.  Discussed pt declining to work with mobility and with PT and how this would impact authorization.  Pt did not deny this or argue, he was willing to talk about other alternatives.  Discussed shelter situation in [place] with [facility] tonight, also discussed [employer] and how that program worked.  Pt agreed to think about these options.  1430: walk test completed for home O2.  Pt needing 6L, still not saturating well, discussed with MD and will retest tomorrow.  1445: CSW spoke with pt again, updated on O2.  CSW encouraged him to consider [facility] and pt willing to think about it overnight, will revisit this tomorrow.     Expected Discharge Plan: Skilled Nursing Facility Barriers to Discharge: Homeless with medical needs, No SNF bed  Expected Discharge Plan and Services Expected Discharge Plan: Skilled Nursing Facility     Post Acute Care Choice: Skilled Nursing Facility Living arrangements for the past 2 months: Homeless                                       Social Determinants of Health (SDOH) Interventions    Readmission Risk Interventions No flowsheet data found.

## 2021-07-23 NOTE — Progress Notes (Signed)
PROGRESS NOTE    Dale Edwards  VVZ:[id number] DOB: [birth year] DOA: 07/12/2021 PCP: Elwyn Reach, MD   Chief Complain: Found on the ground  Brief Narrative: Patient is a 65 year old male with history of alcohol abuse, tobacco use, homelessness who presented to the emergency department after being found on the ground with laceration of the left forehead.  CT head/neck did not any acute intracranial abnormalities.  But showed multiple lucencies along the cervical and thoracic body.  He was also hypoxic on presentation, suspected to be from aspiration pneumonia.  Oncology consulted for bone lucencies, IR consulted for bone biopsy which could be done as an outpatient.  PT/OT recommending skilled nursing facility on discharge.  TOC following  Assessment & Plan:   Principal Problem:   Aspiration pneumonia (HCC) Active Problems:   Generalized weakness   Rhabdomyolysis   Traumatic hematoma of head   Abnormal finding on lung imaging   Abnormal CT scan, neck   Tobacco abuse   Alcohol abuse   Homeless   COPD (chronic obstructive pulmonary disease) (HCC)   Prolonged QT interval   Acute hypoxic respiratory failure: Presented with hypoxia, secondary to aspiration pneumonia.  Respiratory status has stabilized .  Patient states he has been chronically on 3L of Oxygen per minute. he completed antibiotics course for aspiration pneumonia.  Abnormal bone lucencies: Suspicious for malignancy.  Lucencies seen in cervical head, thoracic, cervical spine.  Concern for multiple myeloma.  Case was discussed with oncology, Dr. Lindi Adie who recommended getting CT abdomen/pelvis with contrast, PSA, SPEP.  PSA is normal.  CT abdomen/pelvis did not show any abnormality.  SPEP is pending.  Kappa/lambda light chain negative.  Oncology recommended for biopsy of these lesions.  IR consulted, biopsy will be done as an outpatient or inpatient while he waits for SNF placement  Chronic alcohol abuse: Currently not in  withdrawal.  Continue thiamine, folic acid.  Smoker,smokes 1.5 packs a day, currently on nicotine patch  Traumatic laceration of the scalp: Repaired by ED provider  Homelessness: Education officer, museum following.  Plan is to discharge to SNF  Prolonged JQG:BEEF Qtc of 538.  New ekg showed Qtc of 461  Rhabdomyolysis: Resolved with IV fluids  Disposition: Patient is homeless.  PT/OT recommended skilled NF discharge ,TOC following.  Looks like a difficult placement issue           DVT prophylaxis:Lovenox Code Status: Full Family Communication: None at bedside Patient status:Inpatient  Dispo: The patient is from:Homeless              Anticipated d/c is to: SNF              Anticipated d/c date is: SNF  Consultants: Oncology  Procedures:None  Antimicrobials:  Anti-infectives (From admission, onward)    Start     Dose/Rate Route Frequency Ordered Stop   07/15/21 1000  amoxicillin-clavulanate (AUGMENTIN) 875-125 MG per tablet 1 tablet        1 tablet Oral Every 12 hours 07/15/21 0833 07/16/21 2146   07/13/21 1400  ampicillin-sulbactam (UNASYN) 1.5 g in sodium chloride 0.9 % 100 mL IVPB  Status:  Discontinued        1.5 g 200 mL/hr over 30 Minutes Intravenous Every 6 hours 07/13/21 1333 07/15/21 0833   07/13/21 1100  Ampicillin-Sulbactam (UNASYN) 3 g in sodium chloride 0.9 % 100 mL IVPB  Status:  Discontinued        3 g 200 mL/hr over 30 Minutes Intravenous Every 6 hours 07/13/21 1009 07/13/21  1339   07/13/21 1000  vancomycin (VANCOCIN) IVPB 1000 mg/200 mL premix  Status:  Discontinued        1,000 mg 200 mL/hr over 60 Minutes Intravenous Every 12 hours 07/12/21 2056 07/13/21 1010   07/13/21 0800  ampicillin-sulbactam (UNASYN) 1.5 g in sodium chloride 0.9 % 100 mL IVPB  Status:  Discontinued        1.5 g 200 mL/hr over 30 Minutes Intravenous Every 6 hours 07/13/21 0740 07/13/21 1009   07/13/21 0400  ceFEPIme (MAXIPIME) 2 g in sodium chloride 0.9 % 100 mL IVPB  Status:  Discontinued         2 g 200 mL/hr over 30 Minutes Intravenous Every 8 hours 07/12/21 2056 07/13/21 0740   07/12/21 1830  ceFEPIme (MAXIPIME) 2 g in sodium chloride 0.9 % 100 mL IVPB        2 g 200 mL/hr over 30 Minutes Intravenous  Once 07/12/21 1815 07/12/21 1954   07/12/21 1830  metroNIDAZOLE (FLAGYL) IVPB 500 mg        500 mg 100 mL/hr over 60 Minutes Intravenous  Once 07/12/21 1815 07/12/21 2030   07/12/21 1830  vancomycin (VANCOREADY) IVPB 1500 mg/300 mL        1,500 mg 150 mL/hr over 120 Minutes Intravenous  Once 07/12/21 1815 07/12/21 2251       Subjective:   Patient seen and examined the bedside this morning.  Hemodynamically stable.  Sleeping on the bed.  Denies any complaints today  Objective: Vitals:   07/22/21 1448 07/22/21 2056 07/23/21 0839 07/23/21 1319  BP: 94/66 102/69 (!) 95/59 99/73  Pulse: 76 60 65 79  Resp: '19 16 18 17  ' Temp: 98.1 F (36.7 C) 98.1 F (36.7 C) 98.1 F (36.7 C) 98.4 F (36.9 C)  TempSrc: Oral Oral Oral Oral  SpO2: 94% 99% 93% 93%  Weight:      Height:        Intake/Output Summary (Last 24 hours) at 07/23/2021 1328 Last data filed at 07/23/2021 1321 Gross per 24 hour  Intake 480 ml  Output 800 ml  Net -320 ml   Filed Weights   07/13/21 0744 07/20/21 1226  Weight: 72.6 kg 65.8 kg    Examination:   General exam: Overall comfortable, not in distress, deconditioned, chronically looking HEENT: PERRL Respiratory system:  no wheezes or crackles  Cardiovascular system: S1 & S2 heard, RRR.  Gastrointestinal system: Abdomen is nondistended, soft and nontender. Central nervous system: Alert and oriented Extremities: No edema, no clubbing ,no cyanosis Skin: No rashes, no ulcers,no icterus    Data Reviewed: I have personally reviewed following labs and imaging studies  CBC: No results for input(s): WBC, NEUTROABS, HGB, HCT, MCV, PLT in the last 168 hours.  Basic Metabolic Panel: No results for input(s): NA, K, CL, CO2, GLUCOSE, BUN,  CREATININE, CALCIUM, MG, PHOS in the last 168 hours. GFR: Estimated Creatinine Clearance: 82.2 mL/min (by C-G formula based on SCr of 0.83 mg/dL). Liver Function Tests: No results for input(s): AST, ALT, ALKPHOS, BILITOT, PROT, ALBUMIN in the last 168 hours. No results for input(s): LIPASE, AMYLASE in the last 168 hours. No results for input(s): AMMONIA in the last 168 hours. Coagulation Profile: No results for input(s): INR, PROTIME in the last 168 hours. Cardiac Enzymes: Recent Labs  Lab 07/20/21 1559  CKTOTAL 42*   BNP (last 3 results) No results for input(s): PROBNP in the last 8760 hours. HbA1C: No results for input(s): HGBA1C in the last 72  hours. CBG: No results for input(s): GLUCAP in the last 168 hours. Lipid Profile: No results for input(s): CHOL, HDL, LDLCALC, TRIG, CHOLHDL, LDLDIRECT in the last 72 hours. Thyroid Function Tests: No results for input(s): TSH, T4TOTAL, FREET4, T3FREE, THYROIDAB in the last 72 hours. Anemia Panel: No results for input(s): VITAMINB12, FOLATE, FERRITIN, TIBC, IRON, RETICCTPCT in the last 72 hours. Sepsis Labs: No results for input(s): PROCALCITON, LATICACIDVEN in the last 168 hours.  Recent Results (from the past 240 hour(s))  Culture, blood (Routine X 2) w Reflex to ID Panel     Status: None   Collection Time: 07/16/21 10:44 AM   Specimen: BLOOD  Result Value Ref Range Status   Specimen Description BLOOD BLOOD LEFT ARM  Final   Special Requests   Final    BOTTLES DRAWN AEROBIC AND ANAEROBIC Blood Culture adequate volume   Culture   Final    NO GROWTH 5 DAYS Performed at [facility], [address] [place], [place] [address]    Report Status 07/21/2021 FINAL  Final  Culture, blood (Routine X 2) w Reflex to ID Panel     Status: None   Collection Time: 07/16/21 10:53 AM   Specimen: BLOOD  Result Value Ref Range Status   Specimen Description BLOOD LEFT ANTECUBITAL  Final   Special Requests   Final    BOTTLES DRAWN AEROBIC  AND ANAEROBIC Blood Culture adequate volume   Culture   Final    NO GROWTH 5 DAYS Performed at [facility], [place] [address], [place], [place] [address]    Report Status 07/21/2021 FINAL  Final         Radiology Studies: No results found.      Scheduled Meds:  enoxaparin (LOVENOX) injection  40 mg Subcutaneous D53G   folic acid  1 mg Oral Daily   metoprolol tartrate  12.5 mg Oral BID   multivitamin with minerals  1 tablet Oral Daily   multivitamin with minerals  1 tablet Oral Daily   [START ON 07/24/2021] nicotine  21 mg Transdermal Daily   thiamine  100 mg Oral Daily   Or   thiamine  100 mg Intravenous Daily   thiamine  100 mg Oral Daily   Or   thiamine  100 mg Intravenous Daily   Continuous Infusions:   LOS: 11 days    Time spent:25 mins. More than 50% of that time was spent in counseling and/or coordination of care.      Shelly Coss, MD [facility] P12/26/2022, 1:28 PM

## 2021-07-24 LAB — MULTIPLE MYELOMA PANEL, SERUM
Albumin SerPl Elph-Mcnc: 2.7 g/dL — ABNORMAL LOW (ref 2.9–4.4)
Albumin/Glob SerPl: 1.2 (ref 0.7–1.7)
Alpha 1: 0.4 g/dL (ref 0.0–0.4)
Alpha2 Glob SerPl Elph-Mcnc: 0.8 g/dL (ref 0.4–1.0)
B-Globulin SerPl Elph-Mcnc: 0.6 g/dL — ABNORMAL LOW (ref 0.7–1.3)
Gamma Glob SerPl Elph-Mcnc: 0.5 g/dL (ref 0.4–1.8)
Globulin, Total: 2.3 g/dL (ref 2.2–3.9)
IgA: 189 mg/dL (ref 61–437)
IgG (Immunoglobin G), Serum: 571 mg/dL — ABNORMAL LOW (ref 603–1613)
IgM (Immunoglobulin M), Srm: 37 mg/dL (ref 20–172)
Total Protein ELP: 5 g/dL — ABNORMAL LOW (ref 6.0–8.5)

## 2021-07-24 LAB — CBC WITH DIFFERENTIAL/PLATELET
Abs Immature Granulocytes: 0.04 10*3/uL (ref 0.00–0.07)
Basophils Absolute: 0.1 10*3/uL (ref 0.0–0.1)
Basophils Relative: 1 %
Eosinophils Absolute: 0.4 10*3/uL (ref 0.0–0.5)
Eosinophils Relative: 5 %
HCT: 47.7 % (ref 39.0–52.0)
Hemoglobin: 15.6 g/dL (ref 13.0–17.0)
Immature Granulocytes: 1 %
Lymphocytes Relative: 22 %
Lymphs Abs: 1.9 10*3/uL (ref 0.7–4.0)
MCH: 31.3 pg (ref 26.0–34.0)
MCHC: 32.7 g/dL (ref 30.0–36.0)
MCV: 95.8 fL (ref 80.0–100.0)
Monocytes Absolute: 0.6 10*3/uL (ref 0.1–1.0)
Monocytes Relative: 7 %
Neutro Abs: 5.4 10*3/uL (ref 1.7–7.7)
Neutrophils Relative %: 64 %
Platelets: 507 10*3/uL — ABNORMAL HIGH (ref 150–400)
RBC: 4.98 MIL/uL (ref 4.22–5.81)
RDW: 13.2 % (ref 11.5–15.5)
WBC: 8.4 10*3/uL (ref 4.0–10.5)
nRBC: 0 % (ref 0.0–0.2)

## 2021-07-24 LAB — PROTEIN ELECTROPHORESIS, SERUM
A/G Ratio: 0.8 (ref 0.7–1.7)
Albumin ELP: 2.2 g/dL — ABNORMAL LOW (ref 2.9–4.4)
Alpha-1-Globulin: 0.4 g/dL (ref 0.0–0.4)
Alpha-2-Globulin: 1 g/dL (ref 0.4–1.0)
Beta Globulin: 0.8 g/dL (ref 0.7–1.3)
Gamma Globulin: 0.4 g/dL (ref 0.4–1.8)
Globulin, Total: 2.6 g/dL (ref 2.2–3.9)
Total Protein ELP: 4.8 g/dL — ABNORMAL LOW (ref 6.0–8.5)

## 2021-07-24 LAB — BASIC METABOLIC PANEL
Anion gap: 8 (ref 5–15)
BUN: 13 mg/dL (ref 8–23)
CO2: 29 mmol/L (ref 22–32)
Calcium: 9 mg/dL (ref 8.9–10.3)
Chloride: 102 mmol/L (ref 98–111)
Creatinine, Ser: 0.78 mg/dL (ref 0.61–1.24)
GFR, Estimated: >60 mL/min (ref >60)
Glucose, Bld: 96 mg/dL (ref 70–99)
Potassium: 4.2 mmol/L (ref 3.5–5.1)
Sodium: 139 mmol/L (ref 135–145)

## 2021-07-24 MED ORDER — VANCOMYCIN HCL 1500 MG/300ML IV SOLN
1500.0000 mg | INTRAVENOUS | Status: DC
  Administered 2021-07-24 – 2021-07-26 (×3): 1500 mg via INTRAVENOUS
  Filled 2021-07-24 (×3): qty 300

## 2021-07-24 MED ORDER — SODIUM CHLORIDE 0.9 % IV SOLN
2.0000 g | Freq: Three times a day (TID) | INTRAVENOUS | Status: DC
  Administered 2021-07-24 – 2021-07-26 (×6): 2 g via INTRAVENOUS
  Filled 2021-07-24 (×6): qty 2

## 2021-07-24 NOTE — Progress Notes (Signed)
BP of 152/129 was recorded at 1500 done via Dinamap. Recheck of BP manually by RN 90/60 and follow-up 92/62 15 min later. Feel that the dinamap was picking up the patient's tremors. Will continue to monitor BP.

## 2021-07-24 NOTE — Progress Notes (Signed)
Pharmacy Antibiotic Note  Dale Edwards is a 65 y.o. male admitted on 07/12/2021 with pneumonia.  Pharmacy has been consulted for vanc/cefepime dosing.  Pt has been admitted after a fall and head injury. He has been here since 12/15. He was previously on a course of abx for his PNA. All blood cultures have been neg to date. Abx resume today for PNA again with vanc/cefepime. We will check a MRSA PCR for de-escalation.   Scr 0.78  Plan: Vanc 1500mg  IV q24>>AUC 419, scr 0.8 Cefepime 2g IV q8 MRSA PCR Levels as needed  Height: 5\' 6"  (167.6 cm) Weight: 65.8 kg (145 lb) IBW/kg (Calculated) : 63.8  Temp (24hrs), Avg:98.5 F (36.9 C), Min:98.4 F (36.9 C), Max:98.6 F (37 C)  Recent Labs  Lab 07/24/21 0352  WBC 8.4  CREATININE 0.78    Estimated Creatinine Clearance: 85.3 mL/min (by C-G formula based on SCr of 0.78 mg/dL).    Allergies  Allergen Reactions   Hydrocodone-Acetaminophen Itching and Nausea Only         Antimicrobials this admission: 12/15 vanc >> 12/16 resume 12/27>> 12/15 cefepime >> 12/16 resume 12/27>> 12/15 flagyl x 1 12/16 Unasyn >>12/18 12/18 augmentin>>12/19  Dose adjustments this admission:   Microbiology results: 12/19 blood>>negF 12/15 blood>>ngF  1/20, PharmD, [place], AAHIVP, CPP Infectious Disease Pharmacist 07/24/2021 9:19 AM

## 2021-07-24 NOTE — Progress Notes (Signed)
PROGRESS NOTE    Dale Edwards  FOY:[id number] DOB: 1956/04/06 DOA: 07/12/2021 PCP: Rometta Emery, MD   Chief Complain: Found on the ground  Brief Narrative: Patient is a 65 year old male with history of alcohol abuse, tobacco use, homelessness who presented to the emergency department after being found on the ground with laceration of the left forehead.  CT head/neck did not any acute intracranial abnormalities.  But showed multiple lucencies along the cervical and thoracic body.  He was also hypoxic on presentation, suspected to be from aspiration pneumonia.  Oncology consulted for bone lucencies, IR consulted for bone biopsy which could be done as an outpatient.  PT/OT recommending skilled nursing facility on discharge.  TOC following.  Hospital course remarkable for worsening respiratory status, needing more oxygen, follow-up chest x-ray showed bilateral pneumonia more on the left.  Started on broad spectrum antibiotics for healthcare associated pneumonia  Assessment & Plan:   Principal Problem:   Aspiration pneumonia (HCC) Active Problems:   Generalized weakness   Rhabdomyolysis   Traumatic hematoma of head   Abnormal finding on lung imaging   Abnormal CT scan, neck   Tobacco abuse   Alcohol abuse   Homeless   COPD (chronic obstructive pulmonary disease) (HCC)   Prolonged QT interval   Acute hypoxic respiratory failure: Presented with hypoxia, secondary to aspiration pneumonia, had completed antibiotics.  Respiratory status had stabilized .   On ambulating, patient needed 6L of O2 on ambulation  on 12/26, follow-up chest x-ray showed new finding of bilateral pneumonia more on the left.  Started on vancomycin and cefepime to cover for healthcare associated pneumonia. As per patient,he  has been chronically on 3L of Oxygen per minute.   Abnormal bone lucencies: Suspicious for malignancy.  Lucencies seen in cervical head, thoracic, cervical spine.  Concern for multiple  myeloma.  Case was discussed with oncology, Dr. Pamelia Hoit who recommended getting CT abdomen/pelvis with contrast, PSA, SPEP.  PSA is normal.  CT abdomen/pelvis did not show any abnormality.  SPEP,protein electrophoresis are  pending.  Kappa/lambda light chain negative.  Oncology recommended for biopsy of these lesions.  IR consulted, biopsy will be done as an outpatient or inpatient while he waits for SNF placement or Shelter   Chronic alcohol abuse: Currently not in withdrawal.  Continue thiamine, folic acid.  Smoker,smokes 1.5 packs a day, currently on nicotine patch  Traumatic laceration of the scalp: Repaired by ED provider  Homelessness: Child psychotherapist following.  Plan is to discharge to SNF  Prolonged VEH:MCNO Qtc of 538.  New ekg showed Qtc of 461  Rhabdomyolysis: Resolved with IV fluids  Disposition: Patient is homeless.  PT/OT recommended skilled NF discharge ,TOC following.  Looks like a difficult placement issue.  He may be discharged to a shelter after further  improvement in the respiratory status           DVT prophylaxis:Lovenox Code Status: Full Family Communication: None at bedside Patient status:Inpatient  Dispo: The patient is from:Homeless              Anticipated d/c is to: Shelter              Anticipated d/c date BS:[id number]. Found to have extensive pneumonia on the chest x-ray done on 12/26.  Needs further improvement in the respiratory status for safe discharge  Consultants: Oncology  Procedures:None  Antimicrobials:  Anti-infectives (From admission, onward)    Start     Dose/Rate Route Frequency Ordered Stop   07/15/21 1000  amoxicillin-clavulanate (AUGMENTIN) 875-125 MG per tablet 1 tablet        1 tablet Oral Every 12 hours 07/15/21 0833 07/16/21 2146   07/13/21 1400  ampicillin-sulbactam (UNASYN) 1.5 g in sodium chloride 0.9 % 100 mL IVPB  Status:  Discontinued        1.5 g 200 mL/hr over 30 Minutes Intravenous Every 6 hours 07/13/21 1333 07/15/21  0833   07/13/21 1100  Ampicillin-Sulbactam (UNASYN) 3 g in sodium chloride 0.9 % 100 mL IVPB  Status:  Discontinued        3 g 200 mL/hr over 30 Minutes Intravenous Every 6 hours 07/13/21 1009 07/13/21 1339   07/13/21 1000  vancomycin (VANCOCIN) IVPB 1000 mg/200 mL premix  Status:  Discontinued        1,000 mg 200 mL/hr over 60 Minutes Intravenous Every 12 hours 07/12/21 2056 07/13/21 1010   07/13/21 0800  ampicillin-sulbactam (UNASYN) 1.5 g in sodium chloride 0.9 % 100 mL IVPB  Status:  Discontinued        1.5 g 200 mL/hr over 30 Minutes Intravenous Every 6 hours 07/13/21 0740 07/13/21 1009   07/13/21 0400  ceFEPIme (MAXIPIME) 2 g in sodium chloride 0.9 % 100 mL IVPB  Status:  Discontinued        2 g 200 mL/hr over 30 Minutes Intravenous Every 8 hours 07/12/21 2056 07/13/21 0740   07/12/21 1830  ceFEPIme (MAXIPIME) 2 g in sodium chloride 0.9 % 100 mL IVPB        2 g 200 mL/hr over 30 Minutes Intravenous  Once 07/12/21 1815 07/12/21 1954   07/12/21 1830  metroNIDAZOLE (FLAGYL) IVPB 500 mg        500 mg 100 mL/hr over 60 Minutes Intravenous  Once 07/12/21 1815 07/12/21 2030   07/12/21 1830  vancomycin (VANCOREADY) IVPB 1500 mg/300 mL        1,500 mg 150 mL/hr over 120 Minutes Intravenous  Once 07/12/21 1815 07/12/21 2251       Subjective:   Patient seen and examined at the bedside this morning.  Hemodynamically stable.  Lying on the bed.  He was on room air when I arrived, did not complain of any shortness of breath but was coughing.  Immediately put back on oxygen.  objective: Vitals:   07/23/21 0839 07/23/21 1319 07/23/21 2100 07/24/21 0724  BP: (!) 95/59 99/73 (!) 103/59 104/63  Pulse: 65 79 70 63  Resp: 18 17 18 16   Temp: 98.1 F (36.7 C) 98.4 F (36.9 C) 98.6 F (37 C) 98.4 F (36.9 C)  TempSrc: Oral Oral Oral Oral  SpO2: 93% 93% 97% 92%  Weight:      Height:        Intake/Output Summary (Last 24 hours) at 07/24/2021 0757 Last data filed at 07/24/2021 0547 Gross  per 24 hour  Intake 480 ml  Output 1000 ml  Net -520 ml   Filed Weights   07/13/21 0744 07/20/21 1226  Weight: 72.6 kg 65.8 kg    Examination:   General exam: Overall comfortable, not in distress, deconditioned, chronically looking HEENT: PERRL Respiratory system: Crackles on bilateral bases, diminished air entry more on the left side Cardiovascular system: S1 & S2 heard, RRR.  Gastrointestinal system: Abdomen is nondistended, soft and nontender. Central nervous system: Alert and oriented Extremities: No edema, no clubbing ,no cyanosis Skin: No rashes, no ulcers,no icterus    Data Reviewed: I have personally reviewed following labs and imaging studies  CBC: Recent Labs  Lab 07/24/21 0352  WBC 8.4  NEUTROABS 5.4  HGB 15.6  HCT 47.7  MCV 95.8  PLT 507*    Basic Metabolic Panel: Recent Labs  Lab 07/24/21 0352  NA 139  K 4.2  CL 102  CO2 29  GLUCOSE 96  BUN 13  CREATININE 0.78  CALCIUM 9.0   GFR: Estimated Creatinine Clearance: 85.3 mL/min (by C-G formula based on SCr of 0.78 mg/dL). Liver Function Tests: No results for input(s): AST, ALT, ALKPHOS, BILITOT, PROT, ALBUMIN in the last 168 hours. No results for input(s): LIPASE, AMYLASE in the last 168 hours. No results for input(s): AMMONIA in the last 168 hours. Coagulation Profile: No results for input(s): INR, PROTIME in the last 168 hours. Cardiac Enzymes: Recent Labs  Lab 07/20/21 1559  CKTOTAL 42*   BNP (last 3 results) No results for input(s): PROBNP in the last 8760 hours. HbA1C: No results for input(s): HGBA1C in the last 72 hours. CBG: No results for input(s): GLUCAP in the last 168 hours. Lipid Profile: No results for input(s): CHOL, HDL, LDLCALC, TRIG, CHOLHDL, LDLDIRECT in the last 72 hours. Thyroid Function Tests: No results for input(s): TSH, T4TOTAL, FREET4, T3FREE, THYROIDAB in the last 72 hours. Anemia Panel: No results for input(s): VITAMINB12, FOLATE, FERRITIN, TIBC, IRON,  RETICCTPCT in the last 72 hours. Sepsis Labs: No results for input(s): PROCALCITON, LATICACIDVEN in the last 168 hours.  Recent Results (from the past 240 hour(s))  Culture, blood (Routine X 2) w Reflex to ID Panel     Status: None   Collection Time: 07/16/21 10:44 AM   Specimen: BLOOD  Result Value Ref Range Status   Specimen Description BLOOD BLOOD LEFT ARM  Final   Special Requests   Final    BOTTLES DRAWN AEROBIC AND ANAEROBIC Blood Culture adequate volume   Culture   Final    NO GROWTH 5 DAYS Performed at [facility], [address] [place], [place] [address]    Report Status 07/21/2021 FINAL  Final  Culture, blood (Routine X 2) w Reflex to ID Panel     Status: None   Collection Time: 07/16/21 10:53 AM   Specimen: BLOOD  Result Value Ref Range Status   Specimen Description BLOOD LEFT ANTECUBITAL  Final   Special Requests   Final    BOTTLES DRAWN AEROBIC AND ANAEROBIC Blood Culture adequate volume   Culture   Final    NO GROWTH 5 DAYS Performed at [facility], [address] [place], [place] [address]    Report Status 07/21/2021 FINAL  Final         Radiology Studies: DG CHEST PORT 1 VIEW  Result Date: 07/23/2021 CLINICAL DATA:  Acute respiratory failure with hypoxia EXAM: PORTABLE CHEST 1 VIEW COMPARISON:  07/12/2021 FINDINGS: There is hyperinflation of the lungs compatible with COPD. Diffuse airspace disease throughout the right lung and in the right lung base compatible with pneumonia. Findings worsening on the left since prior study. No visible significant effusions. Heart is normal size. IMPRESSION: COPD. Worsening left lung airspace disease. Right basilar opacity. Findings concerning for pneumonia. Electronically Signed   By: Charlett Nose M.D.   On: 07/23/2021 15:21        Scheduled Meds:  enoxaparin (LOVENOX) injection  40 mg Subcutaneous Q24H   folic acid  1 mg Oral Daily   metoprolol tartrate  12.5 mg Oral BID   multivitamin with minerals   1 tablet Oral Daily   multivitamin with minerals  1 tablet Oral Daily   nicotine  21 mg Transdermal Daily   thiamine  100 mg Oral Daily   Or   thiamine  100 mg Intravenous Daily   thiamine  100 mg Oral Daily   Or   thiamine  100 mg Intravenous Daily   Continuous Infusions:   LOS: 12 days    Time spent:25 mins. More than 50% of that time was spent in counseling and/or coordination of care.      Burnadette Pop, MD [facility] P12/27/2022, 7:57 AM

## 2021-07-24 NOTE — Progress Notes (Signed)
IR received request to evaluate patient for bone lesion biopsy. Imaging reviewed by Dr. Bryn Gulling and Dr. Tommie Sams, both stating the lesions are too small to biopsy. No procedure planned and the order will be deleted. Dr. Renford Dills notified via epic chat.   Alwyn Ren, [place] [phone] 07/24/2021, 3:14 PM

## 2021-07-24 NOTE — Plan of Care (Signed)
°  Problem: Education: Goal: Knowledge of General Education information will improve Description: Including pain rating scale, medication(s)/side effects and non-pharmacologic comfort measures Outcome: Progressing   Problem: Health Behavior/Discharge Planning: Goal: Ability to manage health-related needs will improve Outcome: Progressing   Problem: Clinical Measurements: Goal: Ability to maintain clinical measurements within normal limits will improve Outcome: Progressing Goal: Will remain free from infection Outcome: Progressing Goal: Diagnostic test results will improve Outcome: Progressing Goal: Respiratory complications will improve Outcome: Progressing Goal: Cardiovascular complication will be avoided Outcome: Progressing   Problem: Clinical Measurements: Goal: Will remain free from infection Outcome: Progressing   Problem: Clinical Measurements: Goal: Diagnostic test results will improve Outcome: Progressing   Problem: Clinical Measurements: Goal: Respiratory complications will improve Outcome: Progressing   Problem: Elimination: Goal: Will not experience complications related to bowel motility Outcome: Progressing Goal: Will not experience complications related to urinary retention Outcome: Progressing   Problem: Skin Integrity: Goal: Risk for impaired skin integrity will decrease Outcome: Progressing   Problem: Safety: Goal: Ability to remain free from injury will improve Outcome: Progressing   Problem: Skin Integrity: Goal: Risk for impaired skin integrity will decrease Outcome: Progressing

## 2021-07-24 NOTE — Progress Notes (Signed)
°  Mobility Specialist Criteria Algorithm Info.   07/24/21 1530  Therapy Vitals  BP 92/62  Mobility  Activity Refused mobility (Declined for unspecified reasons)      07/24/2021 4:18 PM

## 2021-07-24 NOTE — Progress Notes (Signed)
Physical Therapy Treatment Patient Details Name: Dale Edwards MRN: [id number] DOB: August 09, 1955 Today's Date: 07/24/2021   History of Present Illness Pt is a 65 y/o male admitted after being found down. Has laceration on head that was closed in ED. Also found to have Rhabdomyolysis. Hospital course remarkable for worsening respiratory status,CXR- showed bilateral PNA. PMH includes MS, COPD, alcohol abuse, tobacco abuse.    PT Comments    Patient progressing slowly towards PT goals. Session focused on progressive ambulation due to newly acquired PNA. Sp02 dropped to 84% on 6L/min 02 [place] with activity, takes >5 minutes to recover. Pt with a few coughing spells lasting minutes at a time, productive. Long rest breaks needed and DOE present when walking and talking. Encouraged increasing activity. Does well using rollator for support. Will likely need supplemental 02. Will follow.   Recommendations for follow up therapy are one component of a multi-disciplinary discharge planning process, led by the attending physician.  Recommendations may be updated based on patient status, additional functional criteria and insurance authorization.  Follow Up Recommendations  Outpatient PT (PT recommends SNF however per social work, no bed offers)     Assistance Recommended at Discharge Intermittent Supervision/Assistance  Equipment Recommendations  Rollator (4 wheels);Other (comment) (02)    Recommendations for Other Services       Precautions / Restrictions Precautions Precautions: Fall;Other (comment) Precaution Comments: Monitor O2 Restrictions Weight Bearing Restrictions: No     Mobility  Bed Mobility               General bed mobility comments: Sitting EOB upon PT arrival.    Transfers Overall transfer level: Needs assistance Equipment used: Rollator (4 wheels) Transfers: Sit to/from Stand Sit to Stand: Supervision           General transfer comment: Cues for hand  placement on rollator and to push from seated surface.  Cues for locking brakes when sitting and standing to improve safety. Stood from [employer], from bench in hall x1.    Ambulation/Gait Ambulation/Gait assistance: Supervision Gait Distance (Feet): 110 Feet (x2 bouts) Assistive device: Rollator (4 wheels) Gait Pattern/deviations: Step-through pattern Gait velocity: functional Gait velocity interpretation: 1.31 - 2.62 ft/sec, indicative of limited community ambulator   General Gait Details: pt with step-through gait, no significant LOB. 1 long seated rest break due to coughing spell, Sp02 dropped to 84% on 6L/min 02 [place], took >5 mins to recover due to coughing.   Stairs             Wheelchair Mobility    Modified Rankin (Stroke Patients Only)       Balance Overall balance assessment: Needs assistance Sitting-balance support: No upper extremity supported;Feet supported Sitting balance-Leahy Scale: Normal     Standing balance support: During functional activity;Reliant on assistive device for balance Standing balance-Leahy Scale: Poor Standing balance comment: Reliant on UE support of assistive device                            Cognition Arousal/Alertness: Awake/alert Behavior During Therapy: WFL for tasks assessed/performed Overall Cognitive Status: Within Functional Limits for tasks assessed                                          Exercises      General Comments General comments (skin integrity, edema, etc.): Sp02 dropped to 84%  on 6L/min 02 [place] with activity, takes >5 minutes to recover. Pt with a few coughing spells lasting minutes at a time, productive as well.      Pertinent Vitals/Pain Pain Assessment: Faces Faces Pain Scale: Hurts little more Pain Location: generalized Pain Descriptors / Indicators: Aching Pain Intervention(s): Monitored during session    Home Living                          Prior Function             PT Goals (current goals can now be found in the care plan section) Progress towards PT goals: Progressing toward goals    Frequency    Min 3X/week      PT Plan Current plan remains appropriate    Co-evaluation              AM-PAC PT "6 Clicks" Mobility   Outcome Measure  Help needed turning from your back to your side while in a flat bed without using bedrails?: None Help needed moving from lying on your back to sitting on the side of a flat bed without using bedrails?: None Help needed moving to and from a bed to a chair (including a wheelchair)?: A Little Help needed standing up from a chair using your arms (e.g., wheelchair or bedside chair)?: A Little Help needed to walk in hospital room?: A Little Help needed climbing 3-5 steps with a railing? : A Little 6 Click Score: 20    End of Session Equipment Utilized During Treatment: Oxygen;Gait belt Activity Tolerance: Patient limited by fatigue;Other (comment) (SOB) Patient left: in bed;with call bell/phone within reach Nurse Communication: Mobility status PT Visit Diagnosis: Unsteadiness on feet (R26.81);Muscle weakness (generalized) (M62.81)     Time: 1430-1500 PT Time Calculation (min) (ACUTE ONLY): 30 min  Charges:  $Gait Training: 8-22 mins $Therapeutic Exercise: 8-22 mins                     Vale Haven, PT, DPT Acute Rehabilitation Services Pager [phone] Office [phone]      Blake Divine A Lanier Ensign 07/24/2021, 3:42 PM

## 2021-07-24 NOTE — Plan of Care (Signed)

## 2021-07-25 LAB — MRSA NEXT GEN BY PCR, NASAL: MRSA by PCR Next Gen: NOT DETECTED

## 2021-07-25 NOTE — Progress Notes (Signed)
Progress note with physical therapy 07/24/21 regarding oxygen requirements:  07/24/21 Oxygen saturation dropped to 86% on 6L/[place] taking >5 minutes to recover. After short breaks oxygen saturation returned to 92%.   07/25/21 Nursing progress note: oxygen requirements:  Attempts to decrease oxygen requirements below 3L/[place] was unsuccessful. Saturations decreased to 84% on 2L/[place]. Saturations maintained at 94% on 3L/ today.

## 2021-07-25 NOTE — Progress Notes (Signed)
PROGRESS NOTE    Dale Edwards  WGY:[id number] DOB: 1956-02-03 DOA: 07/12/2021 PCP: Elwyn Reach, MD   Chief Complain: Found on the ground  Brief Narrative: Patient is a 65 year old male with history of alcohol abuse, tobacco use, homelessness who presented to the emergency department after being found on the ground with laceration of the left forehead.  CT head/neck did not any acute intracranial abnormalities.  But showed multiple lucencies along the cervical and thoracic body.  He was also hypoxic on presentation, suspected to be from aspiration pneumonia.  Oncology consulted for bone lucencies, IR consulted for bone biopsy which could be done as an outpatient.  PT/OT recommending skilled nursing facility on discharge.  TOC following.  Hospital course remarkable for worsening respiratory status, needing more oxygen, follow-up chest x-ray showed bilateral pneumonia more on the left.  Started on broad spectrum antibiotics for healthcare associated pneumonia  Assessment & Plan:   Principal Problem:   Aspiration pneumonia ([place]) Active Problems:   Generalized weakness   Rhabdomyolysis   Traumatic hematoma of head   Abnormal finding on lung imaging   Abnormal CT scan, neck   Tobacco abuse   Alcohol abuse   Homeless   COPD (chronic obstructive pulmonary disease) (HCC)   Prolonged QT interval   Acute hypoxic respiratory failure: Presented with hypoxia, secondary to aspiration pneumonia, had completed antibiotics.  Respiratory status had stabilized .   On ambulating, patient needed 6L of O2 on ambulation  on 12/26, follow-up chest x-ray showed new finding of bilateral pneumonia more on the left.  Started on vancomycin and cefepime to cover for healthcare associated pneumonia. As per patient,he  has been chronically on 3L of Oxygen per minute.  We will check his saturation on ambulation today, will qualify him for home oxygen. Due to severity of pneumonia, will continue current  antibiotics today  Abnormal bone lucencies: Suspicious for malignancy.  Lucencies seen in cervical head, thoracic, cervical spine.  Concern for multiple myeloma.  Case was discussed with oncology, Dr. Lindi Adie who recommended getting CT abdomen/pelvis with contrast, PSA, SPEP.  PSA is normal.  CT abdomen/pelvis did not show any abnormality.  SPEP,protein electrophoresis done: Immunofixation pattern appears unremarkable, no evidence of apparent monoclonal protein. kappa/lambda light chain negative.  Oncology recommended for biopsy of these lesions.  IR consulted, biopsy not done because the size of the lesions are too small.  We Recommend to follow-up with oncology as an outpatient  Chronic alcohol abuse: Currently not in withdrawal.  Continue thiamine, folic acid.  Smoker,smokes 1.5 packs a day, currently on nicotine patch  Traumatic laceration of the scalp: Repaired by ED provider  Homelessness: [employer] following.  Plan is to discharge to SNF  Prolonged XBL:TJQZ Qtc of 538.  New ekg showed Qtc of 461  Rhabdomyolysis: Resolved with IV fluids  Disposition: Patient is homeless.  PT/OT recommended skilled NF discharge ,TOC following.  Looks like a difficult placement issue.  He may be discharged to a shelter after further  improvement in the respiratory status likely tomorrow           DVT prophylaxis:Lovenox Code Status: Full Family Communication: None at bedside Patient status:Inpatient  Dispo: The patient is from:Homeless              Anticipated d/c is to: Shelter              Anticipated d/c date ES:[id number]. Found to have extensive pneumonia on the chest x-ray done on 12/26.  Needs further improvement  in the respiratory status for safe discharge, checking oxygen qualification today  Consultants: Oncology  Procedures:None  Antimicrobials:  Anti-infectives (From admission, onward)    Start     Dose/Rate Route Frequency Ordered Stop   07/24/21 1000  vancomycin (VANCOREADY)  IVPB 1500 mg/300 mL        1,500 mg 150 mL/hr over 120 Minutes Intravenous Every 24 hours 07/24/21 0914     07/24/21 1000  ceFEPIme (MAXIPIME) 2 g in sodium chloride 0.9 % 100 mL IVPB        2 g 200 mL/hr over 30 Minutes Intravenous Every 8 hours 07/24/21 0914     07/15/21 1000  amoxicillin-clavulanate (AUGMENTIN) 875-125 MG per tablet 1 tablet        1 tablet Oral Every 12 hours 07/15/21 0833 07/16/21 2146   07/13/21 1400  ampicillin-sulbactam (UNASYN) 1.5 g in sodium chloride 0.9 % 100 mL IVPB  Status:  Discontinued        1.5 g 200 mL/hr over 30 Minutes Intravenous Every 6 hours 07/13/21 1333 07/15/21 0833   07/13/21 1100  Ampicillin-Sulbactam (UNASYN) 3 g in sodium chloride 0.9 % 100 mL IVPB  Status:  Discontinued        3 g 200 mL/hr over 30 Minutes Intravenous Every 6 hours 07/13/21 1009 07/13/21 1339   07/13/21 1000  vancomycin (VANCOCIN) IVPB 1000 mg/200 mL premix  Status:  Discontinued        1,000 mg 200 mL/hr over 60 Minutes Intravenous Every 12 hours 07/12/21 2056 07/13/21 1010   07/13/21 0800  ampicillin-sulbactam (UNASYN) 1.5 g in sodium chloride 0.9 % 100 mL IVPB  Status:  Discontinued        1.5 g 200 mL/hr over 30 Minutes Intravenous Every 6 hours 07/13/21 0740 07/13/21 1009   07/13/21 0400  ceFEPIme (MAXIPIME) 2 g in sodium chloride 0.9 % 100 mL IVPB  Status:  Discontinued        2 g 200 mL/hr over 30 Minutes Intravenous Every 8 hours 07/12/21 2056 07/13/21 0740   07/12/21 1830  ceFEPIme (MAXIPIME) 2 g in sodium chloride 0.9 % 100 mL IVPB        2 g 200 mL/hr over 30 Minutes Intravenous  Once 07/12/21 1815 07/12/21 1954   07/12/21 1830  metroNIDAZOLE (FLAGYL) IVPB 500 mg        500 mg 100 mL/hr over 60 Minutes Intravenous  Once 07/12/21 1815 07/12/21 2030   07/12/21 1830  vancomycin (VANCOREADY) IVPB 1500 mg/300 mL        1,500 mg 150 mL/hr over 120 Minutes Intravenous  Once 07/12/21 1815 07/12/21 2251       Subjective:   Patient seen and examined at the  bedside this morning.  Hemodynamically stable.  Lying in bed.  Maintaining saturation status of oxygen per minute.  He denies worsening cough, shortness of breath.  We discussed about possible discharge planning tomorrow to shelter.  objective: Vitals:   07/24/21 1505 07/24/21 1530 07/24/21 2027 07/25/21 0721  BP: 90/60 92/62 105/69 110/62  Pulse: 70  71 (!) 59  Resp:   18 15  Temp:   97.9 F (36.6 C) (!) 97.4 F (36.3 C)  TempSrc:   Oral Oral  SpO2:   100% 98%  Weight:      Height:        Intake/Output Summary (Last 24 hours) at 07/25/2021 0804 Last data filed at 07/25/2021 0402 Gross per 24 hour  Intake 620 ml  Output 700 ml  Net -80 ml   Filed Weights   07/13/21 0744 07/20/21 1226  Weight: 72.6 kg 65.8 kg    Examination:   General exam: Deconditioned, chronically ill looking HEENT: PERRL Respiratory system: Diminished air entry on the left side Cardiovascular system: S1 & S2 heard, RRR.  Gastrointestinal system: Abdomen is nondistended, soft and nontender. Central nervous system: Alert and oriented Extremities: No edema, no clubbing ,no cyanosis Skin: No rashes, no ulcers,no icterus    Data Reviewed: I have personally reviewed following labs and imaging studies  CBC: Recent Labs  Lab 07/24/21 0352  WBC 8.4  NEUTROABS 5.4  HGB 15.6  HCT 47.7  MCV 95.8  PLT 507*    Basic Metabolic Panel: Recent Labs  Lab 07/24/21 0352  NA 139  K 4.2  CL 102  CO2 29  GLUCOSE 96  BUN 13  CREATININE 0.78  CALCIUM 9.0   GFR: Estimated Creatinine Clearance: 85.3 mL/min (by C-G formula based on SCr of 0.78 mg/dL). Liver Function Tests: No results for input(s): AST, ALT, ALKPHOS, BILITOT, PROT, ALBUMIN in the last 168 hours. No results for input(s): LIPASE, AMYLASE in the last 168 hours. No results for input(s): AMMONIA in the last 168 hours. Coagulation Profile: No results for input(s): INR, PROTIME in the last 168 hours. Cardiac Enzymes: Recent Labs  Lab  07/20/21 1559  CKTOTAL 42*   BNP (last 3 results) No results for input(s): PROBNP in the last 8760 hours. HbA1C: No results for input(s): HGBA1C in the last 72 hours. CBG: No results for input(s): GLUCAP in the last 168 hours. Lipid Profile: No results for input(s): CHOL, HDL, LDLCALC, TRIG, CHOLHDL, LDLDIRECT in the last 72 hours. Thyroid Function Tests: No results for input(s): TSH, T4TOTAL, FREET4, T3FREE, THYROIDAB in the last 72 hours. Anemia Panel: No results for input(s): VITAMINB12, FOLATE, FERRITIN, TIBC, IRON, RETICCTPCT in the last 72 hours. Sepsis Labs: No results for input(s): PROCALCITON, LATICACIDVEN in the last 168 hours.  Recent Results (from the past 240 hour(s))  Culture, blood (Routine X 2) w Reflex to ID Panel     Status: None   Collection Time: 07/16/21 10:44 AM   Specimen: BLOOD  Result Value Ref Range Status   Specimen Description BLOOD BLOOD LEFT ARM  Final   Special Requests   Final    BOTTLES DRAWN AEROBIC AND ANAEROBIC Blood Culture adequate volume   Culture   Final    NO GROWTH 5 DAYS Performed at [facility], [address], [place], [place] [address]    Report Status 07/21/2021 FINAL  Final  Culture, blood (Routine X 2) w Reflex to ID Panel     Status: None   Collection Time: 07/16/21 10:53 AM   Specimen: BLOOD  Result Value Ref Range Status   Specimen Description BLOOD LEFT ANTECUBITAL  Final   Special Requests   Final    BOTTLES DRAWN AEROBIC AND ANAEROBIC Blood Culture adequate volume   Culture   Final    NO GROWTH 5 DAYS Performed at [facility], [place] [address] [place], [place] [address]    Report Status 07/21/2021 FINAL  Final         Radiology Studies: DG CHEST PORT 1 VIEW  Result Date: 07/23/2021 CLINICAL DATA:  Acute respiratory failure with hypoxia EXAM: PORTABLE CHEST 1 VIEW COMPARISON:  07/12/2021 FINDINGS: There is hyperinflation of the lungs compatible with COPD. Diffuse airspace disease throughout the  right lung and in the right lung base compatible with  pneumonia. Findings worsening on the left since prior study. No visible significant effusions. Heart is normal size. IMPRESSION: COPD. Worsening left lung airspace disease. Right basilar opacity. Findings concerning for pneumonia. Electronically Signed   By: Rolm Baptise M.D.   On: 07/23/2021 15:21        Scheduled Meds:  enoxaparin (LOVENOX) injection  40 mg Subcutaneous [id number]   folic acid  1 mg Oral Daily   metoprolol tartrate  12.5 mg Oral BID   multivitamin with minerals  1 tablet Oral Daily   multivitamin with minerals  1 tablet Oral Daily   nicotine  21 mg Transdermal Daily   thiamine  100 mg Oral Daily   Or   thiamine  100 mg Intravenous Daily   thiamine  100 mg Oral Daily   Or   thiamine  100 mg Intravenous Daily   Continuous Infusions:  ceFEPime (MAXIPIME) IV 2 g (07/25/21 0524)   vancomycin 1,500 mg (07/24/21 1048)     LOS: 13 days    Time spent:25 mins. More than 50% of that time was spent in counseling and/or coordination of care.      Shelly Coss, MD [facility] P12/28/2022, 8:04 AM

## 2021-07-25 NOTE — Progress Notes (Signed)
°   07/25/21 1031  Mobility  Activity Refused mobility (Patient decliened d/t just getting back in bed)

## 2021-07-25 NOTE — Plan of Care (Signed)
°  Problem: Education: Goal: Knowledge of General Education information will improve Description: Including pain rating scale, medication(s)/side effects and non-pharmacologic comfort measures Outcome: Progressing   Problem: Activity: Goal: Risk for activity intolerance will decrease Outcome: Progressing   Problem: Nutrition: Goal: Adequate nutrition will be maintained Outcome: Progressing   Problem: Elimination: Goal: Will not experience complications related to bowel motility Outcome: Progressing Goal: Will not experience complications related to urinary retention Outcome: Progressing   Problem: Coping: Goal: Level of anxiety will decrease Outcome: Progressing   Problem: Safety: Goal: Ability to remain free from injury will improve Outcome: Progressing   Problem: Skin Integrity: Goal: Risk for impaired skin integrity will decrease Outcome: Progressing   Problem: Pain Managment: Goal: General experience of comfort will improve Outcome: Progressing

## 2021-07-26 ENCOUNTER — Inpatient Hospital Stay (HOSPITAL_COMMUNITY): Payer: Medicare Other

## 2021-07-26 ENCOUNTER — Other Ambulatory Visit (HOSPITAL_COMMUNITY): Payer: Self-pay

## 2021-07-26 LAB — BASIC METABOLIC PANEL
Anion gap: 5 (ref 5–15)
BUN: 13 mg/dL (ref 8–23)
CO2: 28 mmol/L (ref 22–32)
Calcium: 8.6 mg/dL — ABNORMAL LOW (ref 8.9–10.3)
Chloride: 104 mmol/L (ref 98–111)
Creatinine, Ser: 0.84 mg/dL (ref 0.61–1.24)
GFR, Estimated: >60 mL/min (ref >60)
Glucose, Bld: 101 mg/dL — ABNORMAL HIGH (ref 70–99)
Potassium: 4.3 mmol/L (ref 3.5–5.1)
Sodium: 137 mmol/L (ref 135–145)

## 2021-07-26 MED ORDER — AMOXICILLIN-POT CLAVULANATE 875-125 MG PO TABS: 1.0000 | ORAL_TABLET | Freq: Two times a day (BID) | ORAL | 0 refills | Status: DC

## 2021-07-26 MED ORDER — NICOTINE 21 MG/24HR TD PT24
21.0000 mg | MEDICATED_PATCH | Freq: Every day | TRANSDERMAL | 0 refills | Status: DC
Start: 2021-07-27 — End: 2022-01-02
  Filled 2021-07-26: qty 28, 28d supply, fill #0

## 2021-07-26 MED ORDER — FOLIC ACID 1 MG PO TABS
1.0000 mg | ORAL_TABLET | Freq: Every day | ORAL | 1 refills | Status: DC
  Filled 2021-07-26: qty 30, 30d supply, fill #0

## 2021-07-26 MED ORDER — ALBUTEROL SULFATE HFA 108 (90 BASE) MCG/ACT IN AERS
2.0000 | INHALATION_SPRAY | Freq: Four times a day (QID) | RESPIRATORY_TRACT | 1 refills | Status: DC | PRN
  Filled 2021-07-26: qty 8.5, 25d supply, fill #0

## 2021-07-26 MED ORDER — AMOXICILLIN-POT CLAVULANATE 875-125 MG PO TABS
1.0000 | ORAL_TABLET | Freq: Two times a day (BID) | ORAL | 0 refills | Status: DC
  Filled 2021-07-26 – 2021-07-27 (×2): qty 10, 5d supply, fill #0

## 2021-07-26 MED ORDER — ACETAMINOPHEN 325 MG PO TABS
650.0000 mg | ORAL_TABLET | ORAL | 2 refills | Status: DC | PRN
  Filled 2021-07-26: qty 100, 9d supply, fill #0

## 2021-07-26 MED ORDER — FOLIC ACID 1 MG PO TABS: 1.0000 mg | ORAL_TABLET | Freq: Every day | ORAL | 1 refills | Status: DC

## 2021-07-26 MED ORDER — THIAMINE HCL 100 MG PO TABS: 100.0000 mg | ORAL_TABLET | Freq: Every day | ORAL | 1 refills | Status: DC

## 2021-07-26 MED ORDER — ALBUTEROL SULFATE HFA 108 (90 BASE) MCG/ACT IN AERS: 2.0000 | INHALATION_SPRAY | Freq: Four times a day (QID) | RESPIRATORY_TRACT | 1 refills | Status: DC | PRN

## 2021-07-26 MED ORDER — NICOTINE 21 MG/24HR TD PT24
21.0000 mg | MEDICATED_PATCH | Freq: Every day | TRANSDERMAL | 0 refills | Status: DC
Start: 2021-07-27 — End: 2021-07-26

## 2021-07-26 MED ORDER — AMOXICILLIN-POT CLAVULANATE 875-125 MG PO TABS
1.0000 | ORAL_TABLET | Freq: Two times a day (BID) | ORAL | Status: DC
  Administered 2021-07-26 – 2021-07-27 (×2): 1 via ORAL
  Filled 2021-07-26 (×2): qty 1

## 2021-07-26 MED ORDER — ACETAMINOPHEN 325 MG PO TABS: 650.0000 mg | ORAL_TABLET | ORAL | 2 refills | Status: DC | PRN

## 2021-07-26 NOTE — TOC Progression Note (Addendum)
Transition of Care [facility]) - Progression Note    Patient Details  Name: Dale Edwards MRN: [id number] Date of Birth: 20-Dec-1955  Transition of [facility] CM/SW Contact  Lorri Frederick, LCSW Phone Number: 07/26/2021, 1:32 PM  Clinical Narrative:   CSW spoke with [facility].  They cannot accept someone on home oxygen.  Pt will not be able to discharge today.  1430: Pt discussed at [employer], they requested information on any income.  Will pursue [facility] project as possible housing.      1445: Pt reports he does have disability income of approx $700 per month, will get his next check on Jan 3.  Pt provided brief housing history: was staying with several other people in a rental home, the person who had leased the home passed away in March 12, 2023, continued to stay there several more months but eventually power was shut off, left in November.  Has been staying in hotels and with friends since that time.      Expected Discharge Plan: Skilled Nursing Facility Barriers to Discharge: Homeless with medical needs, No SNF bed  Expected Discharge Plan and Services Expected Discharge Plan: Skilled Nursing Facility     Post Acute Care Choice: Skilled Nursing Facility Living arrangements for the past 2 months: Homeless Expected Discharge Date: 07/26/21                                     Social Determinants of Health (SDOH) Interventions    Readmission Risk Interventions No flowsheet data found.

## 2021-07-26 NOTE — Discharge Summary (Addendum)
Physician Discharge Summary  Dale Edwards ZOX:[id number] DOB: 08/19/1955 DOA: 07/12/2021  PCP: Elwyn Reach, MD  Admit date: 07/12/2021 Discharge date: 07/26/2021  Admitted From: Home Disposition:  Home  Discharge Condition:Stable CODE STATUS:FULL Diet recommendation: Shelter  Brief/Interim Summary:  Patient is a 65 year old male with history of alcohol abuse, tobacco use, homelessness who presented to the emergency department after being found on the ground with laceration of the left forehead.  CT head/neck did not any acute intracranial abnormalities.  But showed multiple lucencies along the cervical and thoracic body.  He was also hypoxic on presentation, suspected to be from aspiration pneumonia.  Oncology consulted for bone lucencies, IR consulted for bone biopsy but not done because the lesions were too small for biopsy.  Hospital course remarkable for worsening respiratory status, needing more oxygen, follow-up chest x-ray showed bilateral pneumonia more on the left.  Started on broad spectrum antibiotics for healthcare associated pneumonia.  He qualified for home oxygen, on 4 L of oxygen per minute.  Medically stable for discharge to shelter today.  Following problems were addressed during his hospitalization:  Acute hypoxic respiratory failure: Presented with hypoxia, secondary to aspiration pneumonia, had completed antibiotics.  Respiratory status had stabilized .   On ambulating, patient needed 6L of O2 on ambulation  on 12/26, follow-up chest x-ray showed new finding of bilateral pneumonia more on the left.  Started on vancomycin and cefepime to cover for healthcare associated pneumonia. As per patient,he  has been chronically on 3L of Oxygen per minute.  Patient qualified for home oxygen at 4 L/min. Antibiotics changed to oral on discharge.   Abnormal bone lucencies: Suspicious for malignancy.  Lucencies seen in cervical head, thoracic, cervical spine.  Concern for  multiple myeloma.  Case was discussed with oncology, Dr. Lindi Adie who recommended getting CT abdomen/pelvis with contrast, PSA, SPEP.  PSA is normal.  CT abdomen/pelvis did not show any abnormality.  SPEP,protein electrophoresis done: Immunofixation pattern appears unremarkable, no evidence of apparent monoclonal protein. kappa/lambda light chain negative.  Oncology recommended for biopsy of these lesions.  IR consulted, biopsy not done because the size of the lesions are too small.  We Recommend to follow-up with oncology as an outpatient.  I have sent message to Dr. Lindi Adie for arranging follow-up as an outpatient   Chronic alcohol abuse: Currently not in withdrawal.  Continue thiamine, folic acid.  Smoker,smokes 1.5 packs a day, currently on nicotine patch   Traumatic laceration of the scalp: Repaired by ED provider   Homelessness: Education officer, museum following.  Plan is to discharge to shelter   Prolonged WJX:BJYN Qtc of 538.  New ekg showed Qtc of 461   Rhabdomyolysis: Resolved with IV fluids   Disposition: Patient is homeless.  PT/OT recommended skilled NF discharge ,TOC following. He will  be discharged to a shelter     Discharge Diagnoses:  Principal Problem:   Aspiration pneumonia ([place]) Active Problems:   Generalized weakness   Rhabdomyolysis   Traumatic hematoma of head   Abnormal finding on lung imaging   Abnormal CT scan, neck   Tobacco abuse   Alcohol abuse   Homeless   COPD (chronic obstructive pulmonary disease) (HCC)   Prolonged QT interval    Discharge Instructions  Discharge Instructions     Diet general   Complete by: As directed    Discharge instructions   Complete by: As directed    1)Please take prescribed medications as instructed 2)F/U with your PCP in a week 3)Please stop  alcohol intake and smoking   Increase activity slowly   Complete by: As directed    No wound care   Complete by: As directed       Allergies as of 07/26/2021       Reactions    Hydrocodone-acetaminophen Itching, Nausea Only            Medication List     STOP taking these medications    propranolol 40 MG tablet Commonly known as: INDERAL   Tysabri 300 MG/15ML injection Generic drug: natalizumab       TAKE these medications    acetaminophen 325 MG tablet Commonly known as: Tylenol Take 2 tablets (650 mg total) by mouth every 4 (four) hours as needed.   albuterol 108 (90 Base) MCG/ACT inhaler Commonly known as: VENTOLIN HFA Inhale 2 puffs into the lungs every 6 (six) hours as needed for wheezing or shortness of breath.   amoxicillin-clavulanate 875-125 MG tablet Commonly known as: AUGMENTIN Take 1 tablet by mouth every 12 (twelve) hours for 5 days.   folic acid 1 MG tablet Commonly known as: FOLVITE Take 1 tablet (1 mg total) by mouth daily. Start taking on: July 27, 2021   nicotine 21 mg/24hr patch Commonly known as: NICODERM CQ - dosed in mg/24 hours Place 1 patch (21 mg total) onto the skin daily. Start taking on: July 27, 2021   thiamine 100 MG tablet Take 1 tablet (100 mg total) by mouth daily. Start taking on: July 27, 2021               Durable Medical Equipment  (From admission, onward)           Start     Ordered   07/26/21 1101  For home use only DME oxygen  Once       Question Answer Comment  Length of Need Lifetime   Mode or (Route) Nasal cannula   Liters per Minute 4   Frequency Continuous (stationary and portable oxygen unit needed)   Oxygen delivery system Gas      07/26/21 1100            Follow-up Information     Elwyn Reach, MD. Schedule an appointment as soon as possible for a visit in 1 week(s).   Specialty: Internal Medicine Contact information: [place] [address] [phone]                Allergies  Allergen Reactions   Hydrocodone-Acetaminophen Itching and Nausea Only          Consultations: Oncology   Procedures/Studies: DG Chest 1 View  Result Date: 07/12/2021 CLINICAL DATA:  Unwitnessed fall EXAM: CHEST  1 VIEW COMPARISON:  03/25/2014 FINDINGS: Chronic changes in the right mid and lower lung compatible with scarring. Interstitial prominence within the left lung. Blunting of the right costophrenic angle, likely scarring. No effusion on the left. Heart is upper limits normal in size. No acute bony abnormality. IMPRESSION: Chronic changes on the right. Interstitial prominence within the left lung. This could reflect chronic lung disease, less likely infection. Electronically Signed   By: Rolm Baptise M.D.   On: 07/12/2021 17:43   DG Pelvis 1-2 Views  Result Date: 07/12/2021 CLINICAL DATA:  Fall EXAM: PELVIS - 1-2 VIEW COMPARISON:  None. FINDINGS: Hip joints and SI joints are symmetric. No acute bony abnormality. Specifically, no fracture, subluxation, or dislocation. IMPRESSION: No acute bony abnormality. Electronically Signed   By: Lennette Bihari  [place] M.D.   On: 07/12/2021 17:44   DG Knee 2 Views Left  Result Date: 07/12/2021 CLINICAL DATA:  Fall EXAM: LEFT KNEE - 1-2 VIEW COMPARISON:  None. FINDINGS: No acute bony abnormality. Specifically, no fracture, subluxation, or dislocation. Joint spaces maintained. No joint effusion. IMPRESSION: No acute bony abnormality. Electronically Signed   By: Rolm Baptise M.D.   On: 07/12/2021 17:43   DG Knee 2 Views Right  Result Date: 07/12/2021 CLINICAL DATA:  Unwitnessed fall EXAM: RIGHT KNEE - 1-2 VIEW COMPARISON:  08/24/2014 FINDINGS: Old proximal right fibular fracture again seen, unchanged. No acute fracture, subluxation or dislocation. No joint effusion. Joint spaces maintained. IMPRESSION: No acute bony abnormality. Electronically Signed   By: Rolm Baptise M.D.   On: 07/12/2021 17:44   CT HEAD WO CONTRAST (5MM)  Result Date: 07/12/2021 CLINICAL DATA:  [place] yesterday. EXAM: CT HEAD WITHOUT CONTRAST CT CERVICAL SPINE  WITHOUT CONTRAST TECHNIQUE: Multidetector CT imaging of the head and cervical spine was performed following the standard protocol without intravenous contrast. Multiplanar CT image reconstructions of the cervical spine were also generated. COMPARISON:  MRI brain dated May 08, 2019. MR cervical spine dated Nov 30, 2014. CT head dated August 06, 2012. FINDINGS: CT HEAD FINDINGS Brain: No evidence of acute infarction, hemorrhage, hydrocephalus, extra-axial collection or mass lesion/mass effect. Stable moderate atrophy and chronic microvascular ischemic changes. 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 Vascular: No hyperdense vessel or unexpected calcification. Skull: Normal. Negative for fracture or focal lesion. Sinuses/Orbits: No acute finding. Other: Small frontal scalp hematomas, greater on the left. CT CERVICAL SPINE FINDINGS Alignment: Normal. Skull base and vertebrae: No acute fracture. Multiple scattered subcentimeter lucent lesions in the visualized cervical and upper thoracic vertebral bodies, the largest at C6 measuring 6 mm (series 11, image 31). Soft tissues and spinal canal: No prevertebral fluid or swelling. No visible canal hematoma. Disc levels: Mild multilevel disc height loss and endplate spurring from U3-A4 through C6-C7. Severe uncovertebral hypertrophy at C6-C7. Upper chest: Scarring and bullous emphysematous changes in the right upper lobe. Confluent ground-glass density in the left lung apex. Other: None. IMPRESSION: 1. No acute intracranial abnormality. Small frontal scalp hematomas, greater on the left. 2. No acute cervical spine fracture or traumatic listhesis. 3. Multiple scattered subcentimeter lucent lesions in the visualized cervical and upper thoracic vertebral bodies. These were not apparent on prior MRI cervical spine from 2016. Differential diagnosis includes multiple myeloma and metastatic disease. Appropriate oncologic workup is recommended. 4. Confluent  ground-glass density in the left lung apex. This could represent pneumonia or aspiration. Consider further evaluation with chest CT. Electronically Signed   By: Titus Dubin M.D.   On: 07/12/2021 18:23   CT CHEST W CONTRAST  Result Date: 07/12/2021 CLINICAL DATA:  Evaluate left apical ground-glass infiltrates on cervical spine CT today, which also demonstrated subcentimeter lucent lesions in the cervical and upper thoracic spine consistent with Mets or myeloma. EXAM: CT CHEST WITH CONTRAST TECHNIQUE: Multidetector CT imaging of the chest was performed during intravenous contrast administration. CONTRAST:  3m OMNIPAQUE IOHEXOL 300 MG/ML  SOLN COMPARISON:  Portable chest today, CTA chest 03/26/2014. FINDINGS: Cardiovascular: Increased prominence of the pulmonary trunk indicating arterial hypertension. There is no ce2ntral embolus. There is homogeneous aortic and great vessel enhancement, with patchy calcification in the arch. There is no pericardial effusion. The cardiac size is normal . Lipomatous hypertrophy of the intra-atrial septum is again noted. There are scattered three-vessel coronary artery calcifications. Mediastinum/Nodes: Normal thyroid and  thoracic esophagus. New finding of slightly prominent left hilar nodes up to 1.3 cm in short axis, with new left prevascular space and precarinal space nodes up to 1 cm in short axis and a single prominent subcarinal lymph node to the right of 1.2 cm short axis. No right hilar adenopathy, axillary or supraclavicular masses. Lungs/Pleura: Interval worsening bullous disease and increasing scar-like opacities in the right middle and lower lobe bases, increased bullous disease in the apical and anterior right upper lobe with scarring change, and chronic right lateral base pleuroparenchymal scarring again is noted with chronic posterior right pleural thickening. No pleural effusion. There are mild increased paraseptal emphysematous changes in the left upper lobe.  There is interval development of interstitial and patchy ground-glass opacities, widespread within the left upper and lower lobes with relative apical and lower lobe superior segment sparing with similar opacities noted anteriorly in the right upper lobe and patchily in the basal segments of the right lower lobe, but less dense and less extensive. The trachea and central airways are free of filling defect. Upper Abdomen: No acute abnormality. Mild-to-moderate hepatic steatosis. Scattered calcified hepatic granulomas. Musculoskeletal: No chest wall abnormality. No acute or significant osseous findings. There is degenerative disc disease and spondylosis in the thoracic spine. There are few tiny lucent lesions suspected in the head of the left clavicle and in the upper 2 thoracic vertebrae. No other focal bone lesion is seen. IMPRESSION: 1. Interstitial and patchy ground-glass opacities of left-greater-than-right lungs, favor pneumonic process including viral and atypical agents. Edema component less likely given lack of pleural effusions and venous distention. Alveolar proteinosis is also possible but rare. Follow-up study recommended to ensure clearing after treatment. 2. 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 Additional chronic pleural-parenchymal disease on the right. 3. Development of mildly prominent mediastinal and left hilar nodes, possibly reactive. No bulky or encasing adenopathy. 4. Aortic and coronary artery atherosclerosis. 5. Slightly prominent pulmonary trunk increased from previous exam. Lipomatous hypertrophy interatrial septum. 6. Fatty liver. 7. A few tiny lucent lesions suspected in the left clavicle head and in the upper 2 thoracic vertebrae. No destructive or expansile bone lesion. Electronically Signed   By: Telford Nab M.D.   On: 07/12/2021 21:45   CT CERVICAL SPINE WO CONTRAST  Result Date:  07/12/2021 CLINICAL DATA:  [place] yesterday. EXAM: CT HEAD WITHOUT CONTRAST CT CERVICAL SPINE WITHOUT CONTRAST TECHNIQUE: Multidetector CT imaging of the head and cervical spine was performed following the standard protocol without intravenous contrast. Multiplanar CT image reconstructions of the cervical spine were also generated. COMPARISON:  MRI brain dated May 08, 2019. MR cervical spine dated Nov 30, 2014. CT head dated August 06, 2012. FINDINGS: CT HEAD FINDINGS Brain: No evidence of acute infarction, hemorrhage, hydrocephalus, extra-axial collection or mass lesion/mass effect. Stable moderate atrophy and chronic microvascular ischemic changes. 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 Vascular: No hyperdense vessel or unexpected calcification. Skull: Normal. Negative for fracture or focal lesion. Sinuses/Orbits: No acute finding. Other: Small frontal scalp hematomas, greater on the left. CT CERVICAL SPINE FINDINGS Alignment: Normal. Skull base and vertebrae: No acute fracture. Multiple scattered subcentimeter lucent lesions in the visualized cervical and upper thoracic vertebral bodies, the largest at C6 measuring 6 mm (series 11, image 31). Soft tissues and spinal canal: No prevertebral fluid or swelling. No visible canal hematoma. Disc levels: Mild multilevel disc height loss and endplate spurring from Z6-W1 through C6-C7. Severe uncovertebral  hypertrophy at C6-C7. Upper chest: Scarring and bullous emphysematous changes in the right upper lobe. Confluent ground-glass density in the left lung apex. Other: None. IMPRESSION: 1. No acute intracranial abnormality. Small frontal scalp hematomas, greater on the left. 2. No acute cervical spine fracture or traumatic listhesis. 3. Multiple scattered subcentimeter lucent lesions in the visualized cervical and upper thoracic vertebral bodies. These were not apparent on prior MRI cervical spine from 2016. Differential diagnosis includes  multiple myeloma and metastatic disease. Appropriate oncologic workup is recommended. 4. Confluent ground-glass density in the left lung apex. This could represent pneumonia or aspiration. Consider further evaluation with chest CT. Electronically Signed   By: Titus Dubin M.D.   On: 07/12/2021 18:23   CT ABDOMEN PELVIS W CONTRAST  Result Date: 07/19/2021 CLINICAL DATA:  Metastatic disease evaluation. Abnormal lucencies -Noted on clavicle head, thoracic and cervical spine -Concern for multiple myeloma - EXAM: CT ABDOMEN AND PELVIS WITH CONTRAST TECHNIQUE: Multidetector CT imaging of the abdomen and pelvis was performed using the standard protocol following bolus administration of intravenous contrast. CONTRAST:  115m OMNIPAQUE IOHEXOL 300 MG/ML  SOLN COMPARISON:  CT abdomen pelvis 07/12/2021 FINDINGS: Lower chest: Interval increase in bilateral trace pleural effusions, left greater than right. Redemonstration of bibasilar interlobular wall septal thickening and ground-glass airspace opacities. Hepatobiliary: Couple calcifications within the hepatic parenchyma are nonspecific. No focal liver abnormality. No gallstones, gallbladder wall thickening, or pericholecystic fluid. No biliary dilatation. Pancreas: No focal lesion. Normal pancreatic contour. No surrounding inflammatory changes. No main pancreatic ductal dilatation. Spleen: Normal in size without focal abnormality. Adrenals/Urinary Tract: No adrenal nodule bilaterally. Bilateral kidneys enhance symmetrically. No hydronephrosis. No hydroureter. The urinary bladder is unremarkable. On delayed imaging, there is no urothelial wall thickening and there are no filling defects in the opacified portions of the bilateral collecting systems or ureters. Stomach/Bowel: Stomach is within normal limits. No evidence of bowel wall thickening or dilatation. Appendix appears normal. Vascular/Lymphatic: No abdominal aorta or iliac aneurysm. At least mild atherosclerotic  plaque of the aorta and its branches. No abdominal, pelvic, or inguinal lymphadenopathy. Reproductive: Prostate is unremarkable. Other: No intraperitoneal free fluid. No intraperitoneal free gas. No organized fluid collection. Musculoskeletal: Tiny fat containing right inguinal hernia. No suspicious lytic or blastic osseous lesions. No acute displaced fracture. Old left L2 through L4 transverse process fractures. Multilevel degenerative changes of the spine. Mild retrolisthesis of L3 on L4. IMPRESSION: 1. No acute intra-abdominal or intrapelvic abnormality. No findings to suggest metastatic disease. 2. Tiny fat containing right inguinal hernia. 3. Interval increase in bilateral trace pleural effusions, left greater than right. 4.  Aortic Atherosclerosis (ICD10-I70.0). Electronically Signed   By: MIven FinnM.D.   On: 07/19/2021 20:04   DG CHEST PORT 1 VIEW  Result Date: 07/26/2021 CLINICAL DATA:  Shortness of breath EXAM: PORTABLE CHEST 1 VIEW COMPARISON:  Chest radiograph 07/23/2021, CT chest 07/12/2021 FINDINGS: The cardiomediastinal silhouette is grossly stable. There are persistent hazy opacities throughout the left lung and in the right lung base, overall not significantly changed since 07/23/2021. Hyperlucency of the right upper lobe is consistent with underlying COPD. Blunting of the costophrenic angles is unchanged, likely reflecting chronic scarring. The bones are stable. IMPRESSION: Persistent opacities throughout the left lung and right lung base suspicious for persistent infection, overall not significantly changed since 07/23/2021. Electronically Signed   By: PValetta MoleM.D.   On: 07/26/2021 13:04   DG CHEST PORT 1 VIEW  Result Date: 07/23/2021 CLINICAL DATA:  Acute respiratory failure  with hypoxia EXAM: PORTABLE CHEST 1 VIEW COMPARISON:  07/12/2021 FINDINGS: There is hyperinflation of the lungs compatible with COPD. Diffuse airspace disease throughout the right lung and in the right  lung base compatible with pneumonia. Findings worsening on the left since prior study. No visible significant effusions. Heart is normal size. IMPRESSION: COPD. Worsening left lung airspace disease. Right basilar opacity. Findings concerning for pneumonia. Electronically Signed   By: Rolm Baptise M.D.   On: 07/23/2021 15:21      Subjective:  Patient seen and examined at the bedside this morning.  Sitting at the edge of the bed, eating his breakfast.  Denies any worsening cough or shortness of breath.  He qualified for home oxygen.  Discharge Exam: Vitals:   07/25/21 2056 07/26/21 0739  BP: (!) 100/57 (!) 95/49  Pulse: 66 (!) 51  Resp: 17 18  Temp: 98.4 F (36.9 C) (!) 97.3 F (36.3 C)  SpO2: 95% 99%   Vitals:   07/25/21 0721 07/25/21 1515 07/25/21 2056 07/26/21 0739  BP: 110/62 106/62 (!) 100/57 (!) 95/49  Pulse: (!) 59 (!) 59 66 (!) 51  Resp: '15 18 17 18  ' Temp: (!) 97.4 F (36.3 C) 98.4 F (36.9 C) 98.4 F (36.9 C) (!) 97.3 F (36.3 C)  TempSrc: Oral Oral Oral Oral  SpO2: 98% 99% 95% 99%  Weight:      Height:        General: Pt is alert, awake, not in acute distress Cardiovascular: RRR, S1/S2 +, no rubs, no gallops Respiratory: Diminished air sounds bilaterally, crackles on left base Abdominal: Soft, NT, ND, bowel sounds + Extremities: no edema, no cyanosis    The results of significant diagnostics from this hospitalization (including imaging, microbiology, ancillary and laboratory) are listed below for reference.     Microbiology: Recent Results (from the past 240 hour(s))  MRSA Next Gen by PCR, Nasal     Status: None   Collection Time: 07/25/21 11:13 AM   Specimen: Nasal Mucosa; Nasal Swab  Result Value Ref Range Status   MRSA by PCR Next Gen NOT DETECTED NOT DETECTED Final    Comment: (NOTE) The GeneXpert MRSA Assay (FDA approved for NASAL specimens only), is one component of a comprehensive MRSA colonization surveillance program. It is not intended to  diagnose MRSA infection nor to guide or monitor treatment for MRSA infections. Test performance is not FDA approved in patients less than 56 years old. Performed at [facility], [place] [address] [place], [place] [address]      Labs: BNP (last 3 results) No results for input(s): BNP in the last 8760 hours. Basic Metabolic Panel: Recent Labs  Lab 07/24/21 0352 07/26/21 0614  NA 139 137  K 4.2 4.3  CL 102 104  CO2 29 28  GLUCOSE 96 101*  BUN 13 13  CREATININE 0.78 0.84  CALCIUM 9.0 8.6*   Liver Function Tests: No results for input(s): AST, ALT, ALKPHOS, BILITOT, PROT, ALBUMIN in the last 168 hours. No results for input(s): LIPASE, AMYLASE in the last 168 hours. No results for input(s): AMMONIA in the last 168 hours. CBC: Recent Labs  Lab 07/24/21 0352  WBC 8.4  NEUTROABS 5.4  HGB 15.6  HCT 47.7  MCV 95.8  PLT 507*   Cardiac Enzymes: Recent Labs  Lab 07/20/21 1559  CKTOTAL 42*   BNP: Invalid input(s): POCBNP CBG: No results for input(s): GLUCAP in the last 168 hours. D-Dimer No results for input(s): DDIMER in the last  72 hours. Hgb A1c No results for input(s): HGBA1C in the last 72 hours. Lipid Profile No results for input(s): CHOL, HDL, LDLCALC, TRIG, CHOLHDL, LDLDIRECT in the last 72 hours. Thyroid function studies No results for input(s): TSH, T4TOTAL, T3FREE, THYROIDAB in the last 72 hours.  Invalid input(s): FREET3 Anemia work up No results for input(s): VITAMINB12, FOLATE, FERRITIN, TIBC, IRON, RETICCTPCT in the last 72 hours. Urinalysis    Component Value Date/Time   COLORURINE YELLOW 07/12/2021 1648   APPEARANCEUR CLEAR 07/12/2021 1648   LABSPEC 1.020 07/12/2021 1648   PHURINE 5.0 07/12/2021 1648   GLUCOSEU NEGATIVE 07/12/2021 1648   HGBUR TRACE (A) 07/12/2021 1648   BILIRUBINUR NEGATIVE 07/12/2021 1648   KETONESUR >80 (A) 07/12/2021 1648   PROTEINUR NEGATIVE 07/12/2021 1648   UROBILINOGEN 0.2 12/17/2011 1743   NITRITE NEGATIVE  07/12/2021 1648   LEUKOCYTESUR NEGATIVE 07/12/2021 1648   Sepsis Labs Invalid input(s): PROCALCITONIN,  WBC,  LACTICIDVEN Microbiology Recent Results (from the past 240 hour(s))  MRSA Next Gen by PCR, Nasal     Status: None   Collection Time: 07/25/21 11:13 AM   Specimen: Nasal Mucosa; Nasal Swab  Result Value Ref Range Status   MRSA by PCR Next Gen NOT DETECTED NOT DETECTED Final    Comment: (NOTE) The GeneXpert MRSA Assay (FDA approved for NASAL specimens only), is one component of a comprehensive MRSA colonization surveillance program. It is not intended to diagnose MRSA infection nor to guide or monitor treatment for MRSA infections. Test performance is not FDA approved in patients less than 57 years old. Performed at [facility], [place] [address], [place], [place] [address]     Please note: You were cared for by a hospitalist during your hospital stay. Once you are discharged, your primary care physician will handle any further medical issues. Please note that NO REFILLS for any discharge medications will be authorized once you are discharged, as it is imperative that you return to your primary care physician (or establish a relationship with a primary care physician if you do not have one) for your post hospital discharge needs so that they can reassess your need for medications and monitor your lab values.    Time coordinating discharge: 40 minutes  SIGNED:   Shelly Coss, MD  [facility] 07/26/2021, 1:09 PM Pager [phone]  If 7PM-7AM, please contact night-coverage [email or web address] Password [email or web address]

## 2021-07-26 NOTE — Progress Notes (Signed)
Occupational Therapy Treatment Patient Details Name: Dale Edwards MRN: [id number] DOB: Jan 30, 1956 Today's Date: 07/26/2021   History of present illness 65 y/o male admitted after being found down. Has laceration on head that was closed in ED. Also found to have Rhabdomyolysis. Hospital course remarkable for worsening respiratory status,CXR- showed bilateral PNA. PMH includes MS, COPD, alcohol abuse, tobacco abuse.   OT comments  Pt continues to present with decreased activity tolerance as seen by fatigue and decreased SpO2. Pt performing functional mobility in hallway with Min guard A and rollator. During mobility, pt requiring both seated rest break and 4L O2 to recover from 78-85% to 90% SpO2. Pt eager to perform grooming tasks at sink; shaving and brushing his teeth with close supervision for safety in standing. Continue to recommend follow up at SNF, however, aware no bed offers. Due to poor activity tolerance will need support at dc. Will continue to follow acutely as admitted.    Recommendations for follow up therapy are one component of a multi-disciplinary discharge planning process, led by the attending physician.  Recommendations may be updated based on patient status, additional functional criteria and insurance authorization.    Follow Up Recommendations  Skilled nursing-short term rehab (<3 hours/day)    Assistance Recommended at Discharge Frequent or constant Supervision/Assistance  Equipment Recommendations  Other (comment) (Defer to next venue)    Recommendations for Other Services      Precautions / Restrictions Precautions Precautions: Fall;Other (comment) Precaution Comments: Monitor O2 Restrictions Weight Bearing Restrictions: No       Mobility Bed Mobility               General bed mobility comments: Sitting at EOB    Transfers Overall transfer level: Needs assistance Equipment used: Rollator (4 wheels) Transfers: Sit to/from Stand Sit to Stand:  Min guard           General transfer comment: Min Guard A for safety     Balance Overall balance assessment: Needs assistance Sitting-balance support: No upper extremity supported;Feet supported Sitting balance-Leahy Scale: Normal     Standing balance support: Bilateral upper extremity supported;During functional activity;Single extremity supported Standing balance-Leahy Scale: Poor Standing balance comment: Requiring atleast one UE support for standing                           ADL either performed or assessed with clinical judgement   ADL Overall ADL's : Needs assistance/impaired     Grooming: Supervision/safety;Set up;Standing;Oral care (shaving)                   Toilet Transfer: Min guard;Ambulation;Rollator (4 wheels);BSC/3in1           Functional mobility during ADLs: Min guard;Rollator (4 wheels) General ADL Comments: Pt presenting with decreased activity tolerance. Requiring seated rest breaks to maintain SpO2 >90% on 3-4L O2 with activity. Pt performing functional mobiltiy in hallway and then shaving at isnk.    Extremity/Trunk Assessment Upper Extremity Assessment Upper Extremity Assessment: Generalized weakness   Lower Extremity Assessment Lower Extremity Assessment: Defer to PT evaluation        Vision       Perception     Praxis      Cognition Arousal/Alertness: Awake/alert Behavior During Therapy: WFL for tasks assessed/performed Overall Cognitive Status: Within Functional Limits for tasks assessed  Exercises     Shoulder Instructions       General Comments SpO2 dropping to 78 on RA with activity. Requiring 3-4L O2 and seated rest breaks to recover back to 90s.    Pertinent Vitals/ Pain       Pain Assessment: Faces Faces Pain Scale: Hurts little more Pain Location: generalized Pain Descriptors / Indicators: Aching Pain Intervention(s): Monitored during  session;Limited activity within patient's tolerance;Repositioned  Home Living                                          Prior Functioning/Environment              Frequency  Min 2X/week        Progress Toward Goals  OT Goals(current goals can now be found in the care plan section)  Progress towards OT goals: Progressing toward goals  Acute Rehab OT Goals OT Goal Formulation: With patient Time For Goal Achievement: 08/09/21 Potential to Achieve Goals: Good ADL Goals Pt Will Perform Grooming: with modified independence;standing Pt Will Perform Lower Body Bathing: with modified independence;sit to/from stand Pt Will Perform Lower Body Dressing: with modified independence;sit to/from stand Pt Will Transfer to Toilet: with modified independence;ambulating;regular height toilet  Plan Discharge plan remains appropriate;Frequency remains appropriate    Co-evaluation                 AM-PAC OT "6 Clicks" Daily Activity     Outcome Measure   Help from another person eating meals?: None Help from another person taking care of personal grooming?: A Little Help from another person toileting, which includes using toliet, bedpan, or urinal?: A Little Help from another person bathing (including washing, rinsing, drying)?: A Little Help from another person to put on and taking off regular upper body clothing?: A Little Help from another person to put on and taking off regular lower body clothing?: A Little 6 Click Score: 19    End of Session Equipment Utilized During Treatment: Gait belt;Rolling walker (2 wheels)  OT Visit Diagnosis: Unsteadiness on feet (R26.81);Muscle weakness (generalized) (M62.81);History of falling (Z91.81);Ataxia, unspecified (R27.0)   Activity Tolerance Patient tolerated treatment well   Patient Left in chair;with call bell/phone within reach;with chair alarm set   Nurse Communication Mobility status        Time:  0940-7680 OT Time Calculation (min): 46 min  Charges: OT General Charges $OT Visit: 1 Visit OT Treatments $Self Care/Home Management : 23-37 mins $Therapeutic Activity: 8-22 mins  Lavilla Delamora MSOT, OTR/L Acute Rehab Pager: [phone] Office: [phone]  Theodoro Grist Adelie Croswell 07/26/2021, 12:50 PM

## 2021-07-26 NOTE — Care Management Important Message (Signed)
Important Message  Patient Details  Name: Dale Edwards MRN: [id number] Date of Birth: [birth year]   Medicare Important Message Given:  Yes     Sherilyn Banker 07/26/2021, 3:53 PM

## 2021-07-26 NOTE — Progress Notes (Signed)
SATURATION QUALIFICATIONS: (This note is used to comply with regulatory documentation for home oxygen)  Patient Saturations on Room Air at Rest = 91%  Patient Saturations on Room Air while Ambulating = 78%  Patient Saturations on 3 Liters of oxygen while Ambulating = 87%  Patient Saturations on 4 Liters of oxygen while Ambulating = 92%  Please briefly explain why patient needs home oxygen: Pt requiring at least 4L O2 to perform minimal activity and maintain SpO2 in 90s.  Victorious Cosio MSOT, OTR/L Acute Rehab Pager: [phone] Office: [phone]

## 2021-07-26 NOTE — Progress Notes (Signed)
Pharmacy Antibiotic Note  Dale Edwards is a 65 y.o. male admitted on 07/12/2021 with pneumonia.  Pharmacy has been consulted for vanc/cefepime dosing.  Pt has been admitted after a fall and head injury. He has been here since 12/15. He was previously on a course of abx for his PNA. All blood cultures have been neg to date. Abx resume today for PNA again with vanc/cefepime.   MRSA PCR neg. Plan to discharge today with 5 more days of augmentin per Dr Renford Dills.   Plan: Dc vanc/cefepime Augmentin 875mg  PO BID x5 days  Height: 5\' 6"  (167.6 cm) Weight: 65.8 kg (145 lb) IBW/kg (Calculated) : 63.8  Temp (24hrs), Avg:98 F (36.7 C), Min:97.3 F (36.3 C), Max:98.4 F (36.9 C)  Recent Labs  Lab 07/24/21 0352 07/26/21 0614  WBC 8.4  --   CREATININE 0.78 0.84     Estimated Creatinine Clearance: 81.2 mL/min (by C-G formula based on SCr of 0.84 mg/dL).    Allergies  Allergen Reactions   Hydrocodone-Acetaminophen Itching and Nausea Only         Antimicrobials this admission: 12/15 vanc >> 12/16 12/27>>12/29 12/15 cefepime >> 12/16 12/27>>12/29 12/15 flagyl x 1 12/16 Unasyn >>12/18 12/18 augmentin>>12/19 12/29>>x5d  1/17, PharmD, BCIDP, AAHIVP, CPP Infectious Disease Pharmacist 07/26/2021 1:04 PM

## 2021-07-26 NOTE — Progress Notes (Signed)
PT Cancellation Note  Patient Details Name: Dale Edwards MRN: [id number] DOB: Aug 21, 1955   Cancelled Treatment:    Reason Eval/Treat Not Completed: Other (comment) (pt just finished working with OT, fatigued, will reattempt)   [name] 07/26/2021, 10:54 AM [name], PT Acute Rehabilitation Services Pager: [phone] Office: [phone]

## 2021-07-26 NOTE — Progress Notes (Signed)
PT Cancellation Note  Patient Details Name: Dale Edwards MRN: [id number] DOB: 03/22/1956   Cancelled Treatment:    Reason Eval/Treat Not Completed: Patient declined, no reason specified (pt refused mobility or participation at this time stating fatigue. Pt aware of benefit and need but stated unable)   [name] 07/26/2021, 12:01 PM [name], PT Acute Rehabilitation Services Pager: [phone] Office: [phone]

## 2021-07-26 NOTE — Progress Notes (Signed)
Mobility Specialist Criteria Algorithm Info.   07/26/21 1350  Mobility  Activity Ambulated in hall;Dangled on edge of bed  Range of Motion/Exercises Active;All extremities  Level of Assistance Standby assist, set-up cues, supervision of patient - no hands on  Assistive Device Front wheel walker  Distance Ambulated (ft) 100 ft  Mobility Ambulated with assistance in hallway  Mobility Response Tolerated well  Mobility performed by Mobility specialist  Bed Position Chair   Patient received in bed willing and eager to participate in mobility. Ambulated in hallway supervision level with steady gait. Had coughing spell throughout ambulation but dissipated after a few minutes of seated rest. Was left  in bed with all needs met, call bell in reach.    07/26/2021 2:30 PM

## 2021-07-26 NOTE — Plan of Care (Signed)
  Problem: Coping: Goal: Level of anxiety will decrease Outcome: Progressing   Problem: Elimination: Goal: Will not experience complications related to bowel motility Outcome: Progressing   Problem: Pain Managment: Goal: General experience of comfort will improve Outcome: Progressing   Problem: Skin Integrity: Goal: Risk for impaired skin integrity will decrease Outcome: Progressing   

## 2021-07-27 ENCOUNTER — Other Ambulatory Visit (HOSPITAL_COMMUNITY): Payer: Self-pay

## 2021-07-27 LAB — CBC
HCT: 43.7 % (ref 39.0–52.0)
Hemoglobin: 14.2 g/dL (ref 13.0–17.0)
MCH: 31.6 pg (ref 26.0–34.0)
MCHC: 32.5 g/dL (ref 30.0–36.0)
MCV: 97.1 fL (ref 80.0–100.0)
Platelets: 440 10*3/uL — ABNORMAL HIGH (ref 150–400)
RBC: 4.5 MIL/uL (ref 4.22–5.81)
RDW: 13.1 % (ref 11.5–15.5)
WBC: 6.8 10*3/uL (ref 4.0–10.5)
nRBC: 0 % (ref 0.0–0.2)

## 2021-07-27 MED ORDER — ALBUTEROL SULFATE HFA 108 (90 BASE) MCG/ACT IN AERS
1.0000 | INHALATION_SPRAY | Freq: Four times a day (QID) | RESPIRATORY_TRACT | Status: DC
  Administered 2021-07-27: 14:00:00 1 via RESPIRATORY_TRACT
  Filled 2021-07-27: qty 6.7

## 2021-07-27 MED ORDER — ALBUTEROL SULFATE HFA 108 (90 BASE) MCG/ACT IN AERS
2.0000 | INHALATION_SPRAY | Freq: Four times a day (QID) | RESPIRATORY_TRACT | 1 refills | Status: AC | PRN
  Filled 2021-07-27: qty 8.5, 25d supply, fill #0

## 2021-07-27 MED ORDER — FOLIC ACID 1 MG PO TABS
1.0000 mg | ORAL_TABLET | Freq: Every day | ORAL | 1 refills | Status: DC
  Filled 2021-07-27: qty 30, 30d supply, fill #0

## 2021-07-27 MED ORDER — AMOXICILLIN-POT CLAVULANATE 875-125 MG PO TABS
1.0000 | ORAL_TABLET | Freq: Two times a day (BID) | ORAL | 0 refills | Status: AC
  Filled 2021-07-27: qty 10, 5d supply, fill #0

## 2021-07-27 MED ORDER — THIAMINE HCL 100 MG PO TABS
100.0000 mg | ORAL_TABLET | Freq: Every day | ORAL | 1 refills | Status: DC
  Filled 2021-07-27: qty 30, 30d supply, fill #0

## 2021-07-27 NOTE — TOC Progression Note (Signed)
Transition of Care [facility]) - Progression Note    Patient Details  Name: Dale Edwards MRN: [id number] Date of Birth: 1956-04-12  Transition of Care [facility]) CM/SW Contact  Lorri Frederick, LCSW Phone Number: 07/27/2021, 2:21 PM  Clinical Narrative:   [facility] director Wandra Mannan has arranged funding for hotel stay at [place] from today and leaving on 08/02/21.  CSW discussed this with pt.  Pt will receive his disability money on 1/3 and is aware of some lower cost hotel options that he can go to after this.  Pt reports he does have one friend who can assist with transportation.  Discussed home O2 from [employer], pt aware that he will be responsible for equipment and that more will be delivered to his hotel room.  Discussed referral to [facility] for help getting to medical appts.  Pt is aware of [employer] for assistance with food and also with the [facility].      Expected Discharge Plan: Skilled Nursing Facility Barriers to Discharge: Homeless with medical needs, No SNF bed  Expected Discharge Plan and Services Expected Discharge Plan: Skilled Nursing Facility     Post Acute Care Choice: Skilled Nursing Facility Living arrangements for the past 2 months: Homeless Expected Discharge Date: 07/26/21                                     Social Determinants of Health (SDOH) Interventions    Readmission Risk Interventions No flowsheet data found.

## 2021-07-27 NOTE — Progress Notes (Addendum)
Patient seen and examined. H.e is stable. He denie ands worsening cough or dyspnea. Lungs CTA. Order flutter valve. Oxygen decreased  to 3 L sat 96 %.  He will be discharged on Augmentin to complete treatment for pneumonia.  For abnormal bone Lucencies cervical , thoracic spine , he will follow-up with Dr. Ave Filter for further evaluation as an outpatient.  They will arrange follow-up.  Patient will be discharge to [facility]. Oxygen will be delivered.  Please for further detail see discharge summary done 12/29 by Dr Burnadette Pop

## 2021-07-27 NOTE — Progress Notes (Signed)
Physical Therapy Treatment Patient Details Name: Dale Edwards MRN: [id number] DOB: [birth year] Today's Date: 07/27/2021   History of Present Illness 65 y/o male admitted after being found down. Has laceration on head that was closed in ED. Also found to have Rhabdomyolysis. Hospital course remarkable for worsening respiratory status,CXR- showed bilateral PNA. PMH includes MS, COPD, alcohol abuse, tobacco abuse.    PT Comments    Patient progressing well with mobility. Continues to have SOB with activity; coughing spells are improved today. Sp02 dropped to 84% on 2L/min 02 [place] so increased 02 to 3L and able to maintain 02 >88% with activity. Recommend rollator and let SW know as pt plans to d/c to [facility] hotel today. Discussed energy conservation, reviewed rollator brakes as well as need for 3-4L of 02 with activity to maintain saturations. Will follow.   Recommendations for follow up therapy are one component of a multi-disciplinary discharge planning process, led by the attending physician.  Recommendations may be updated based on patient status, additional functional criteria and insurance authorization.  Follow Up Recommendations  Outpatient PT     Assistance Recommended at Discharge Intermittent Supervision/Assistance  Equipment Recommendations  Rollator (4 wheels);Other (comment)    Recommendations for Other Services       Precautions / Restrictions Precautions Precautions: Fall;Other (comment) Precaution Comments: Monitor O2 Restrictions Weight Bearing Restrictions: No     Mobility  Bed Mobility               General bed mobility comments: Sitting at EOB    Transfers Overall transfer level: Needs assistance Equipment used: Rollator (4 wheels) Transfers: Sit to/from Stand Sit to Stand: Supervision           General transfer comment: Supervision for safety.    Ambulation/Gait Ambulation/Gait assistance: Supervision Gait Distance (Feet): 110 Feet (x2  bouts) Assistive device: Rollator (4 wheels) Gait Pattern/deviations: Step-through pattern Gait velocity: functional Gait velocity interpretation: 1.31 - 2.62 ft/sec, indicative of limited community ambulator   General Gait Details: pt with step-through gait, no significant LOB. 1 long seated rest break due to fatiguel, Sp02 dropped to 84% on 2L/min 02 [place], took >3 mins to recover. Cues for pursed lip breathing. Walked back on 3L and only dropped to 88%, recovered quickly.   Stairs             Wheelchair Mobility    Modified Rankin (Stroke Patients Only)       Balance Overall balance assessment: Needs assistance Sitting-balance support: No upper extremity supported;Feet supported Sitting balance-Leahy Scale: Normal     Standing balance support: During functional activity Standing balance-Leahy Scale: Poor Standing balance comment: Requiring atleast one UE support for standing                            Cognition Arousal/Alertness: Awake/alert Behavior During Therapy: WFL for tasks assessed/performed Overall Cognitive Status: Within Functional Limits for tasks assessed                                 General Comments: Decreased safety awareness noted, reports poor memory.        Exercises      General Comments General comments (skin integrity, edema, etc.): SP02 dropped to 84% on 2L/mi 2 [place], able to maintain 02 >88% on 3L/min 02 [place].      Pertinent Vitals/Pain Pain Assessment: Faces Faces Pain Scale: Hurts a little  bit Pain Location: generalized Pain Descriptors / Indicators: Aching Pain Intervention(s): Monitored during session;Repositioned    Home Living                          Prior Function            PT Goals (current goals can now be found in the care plan section) Progress towards PT goals: Progressing toward goals    Frequency    Min 3X/week      PT Plan Current plan remains appropriate     Co-evaluation              AM-PAC PT "6 Clicks" Mobility   Outcome Measure  Help needed turning from your back to your side while in a flat bed without using bedrails?: None Help needed moving from lying on your back to sitting on the side of a flat bed without using bedrails?: None Help needed moving to and from a bed to a chair (including a wheelchair)?: A Little Help needed standing up from a chair using your arms (e.g., wheelchair or bedside chair)?: A Little Help needed to walk in hospital room?: A Little Help needed climbing 3-5 steps with a railing? : A Little 6 Click Score: 20    End of Session Equipment Utilized During Treatment: Oxygen;Gait belt Activity Tolerance: Patient limited by fatigue Patient left: in bed;with call bell/phone within reach Nurse Communication: Mobility status PT Visit Diagnosis: Unsteadiness on feet (R26.81);Muscle weakness (generalized) (M62.81)     Time: 7867-5449 PT Time Calculation (min) (ACUTE ONLY): 26 min  Charges:  $Gait Training: 8-22 mins $Therapeutic Exercise: 8-22 mins                     Vale Haven, PT, DPT Acute Rehabilitation Services Pager [phone] Office [phone]      Blake Divine A Lanier Ensign 07/27/2021, 4:09 PM

## 2021-07-27 NOTE — TOC Transition Note (Addendum)
Transition of Care [facility]) - CM/SW Discharge Note   Patient Details  Name: Dale Edwards MRN: [id number] Date of Birth: 11-30-1955  Transition of Care [facility]) CM/SW Contact:  Lorri Frederick, LCSW Phone Number: 07/27/2021, 2:37 PM   Clinical Narrative:   Pt discharging to the [place] [facility].  Home O2 provided by [employer].  Pt will be transported by cab.  Resource list given to pt including [employer], [place] transportation department, and local homelessness resources.  Attempted to provide clothing from social work [employer], only a sweatshirt fit.  Pt has clothes but they are dirty.    1500: PT requesting rollator for pt.  Dr Sunnie Nielsen ordered, Velna Hatchet at [employer] will add to the delivery along with the home O2 equipment.  Velna Hatchet is aware that pt will not be at hotel until approx 6pm, will wait for call or attempt to find pt by room number at front desk.  They can make the delivery after that time this evening.     Final next level of care: Home/Self Care Barriers to Discharge: Barriers Resolved   Patient Goals and CMS Choice        Discharge Placement                       Discharge Plan and Services     Post Acute Care Choice: Skilled Nursing Facility          DME Arranged: Oxygen DME Agency: [employer]       HH Arranged: NA          Social Determinants of Health (SDOH) Interventions     Readmission Risk Interventions No flowsheet data found.

## 2021-07-27 NOTE — Care Management (Addendum)
Home oxygen ordered through [employer]. They will begin processing the order and his insurance. Once discharge address known NCM will update [employer].   Prior to discharge [employer] will bring portable oxygen to patient's hospital room and provide education on use.   PT recommending OP PT. Order placed for same and information placed on AVS.   NCM called [facility] spoke to [facility] , made referral for transportation to [facility] appointments. Patient just has to call and schedule when needed. Information on AVS.

## 2021-08-09 ENCOUNTER — Encounter (HOSPITAL_COMMUNITY): Payer: Self-pay | Admitting: Radiology

## 2021-08-26 NOTE — Progress Notes (Deleted)
[facility] up:    Dale Reach, MD [place] [address]   DIAGNOSIS: Cancer Staging  No matching staging information was found for the patient.  SUMMARY OF ONCOLOGIC HISTORY: Oncology History   No history exists.    CURRENT THERAPY:  INTERVAL HISTORY: Dale Edwards 66 y.o. male returns for    Patient Active Problem List   Diagnosis Date Noted   Aspiration pneumonia ([place]) 07/12/2021   Generalized weakness 07/12/2021   Rhabdomyolysis 07/12/2021   Traumatic hematoma of head 07/12/2021   Abnormal finding on lung imaging 07/12/2021   Abnormal CT scan, neck 07/12/2021   Tobacco abuse 07/12/2021   Alcohol abuse 07/12/2021   Homeless 07/12/2021   COPD (chronic obstructive pulmonary disease) ([place]) 07/12/2021   Prolonged QT interval 07/12/2021   Relapsing remitting multiple sclerosis ([place]) 10/12/2019   Tremor of right hand 10/12/2019   Gait abnormality 10/12/2019   Tremor 10/30/2017   Vitamin D deficiency 04/10/2017   Lumbar radiculopathy 11/15/2014   Low back pain 08/01/2014   Multiple sclerosis ([place]) 02/09/2013   Abnormality of gait 01/08/2013   Right sided weakness 01/08/2013   EMPHYSEMA 02/17/2009   DYSPNEA 02/17/2009   Nonspecific (abnormal) findings on radiological and other examination of body structure 02/17/2009   CT, CHEST, ABNORMAL 02/17/2009    is allergic to hydrocodone-acetaminophen.  MEDICAL HISTORY: Past Medical History:  Diagnosis Date   Back pain    COPD (chronic obstructive pulmonary disease) (HCC)    Multiple sclerosis (HCC)    Tremors of nervous system    Right    SURGICAL HISTORY: Past Surgical History:  Procedure Laterality Date   ADENOIDECTOMY     broken leg     TONSILLECTOMY      SOCIAL HISTORY: Social History   Socioeconomic History   Marital status: Single    Spouse name: Not on file   Number of children: 0   Years of education: GED   Highest education level: Not  on file  Occupational History    Employer: UNEMPLOYED    Comment: Disabled  Tobacco Use   Smoking status: Every Day    Packs/day: 0.50    Types: Cigarettes   Smokeless tobacco: Never   Tobacco comments:    Half Pack  Substance and Sexual Activity   Alcohol use: Yes    Alcohol/week: 1.0 standard drink    Types: 1 Standard drinks or equivalent per week    Comment: quit 2 years ago..   pt is now drinking on occ - very rare maybe once a month.   Drug use: No   Sexual activity: Not on file  Other Topics Concern   Not on file  Social History Narrative   Patient lives at home with Olin Hauser.   Patient does not have a significant other.    Patient is disabled.   Patient has no children.    Patient has his GED   Right handed.   Caffeine - four glasses and some sodas   Social Determinants of Health   Financial Resource Strain: Not on file  Food Insecurity: Not on file  Transportation Needs: Not on file  Physical Activity: Not on file  Stress: Not on file  Social Connections: Not on file  Intimate Partner Violence: Not on file    FAMILY HISTORY: Family History  Problem Relation Age of Onset   Heart Problems Mother     Review of Systems - Oncology  PHYSICAL EXAMINATION  ECOG PERFORMANCE STATUS: {CHL ONC ECOG PS:[phone]}  There were no vitals filed for this visit.  Physical Exam  LABORATORY DATA:  CBC    Component Value Date/Time   WBC 6.8 07/27/2021 0745   RBC 4.50 07/27/2021 0745   HGB 14.2 07/27/2021 0745   HGB 17.1 09/26/2020 1422   HCT 43.7 07/27/2021 0745   HCT 51.4 (H) 09/26/2020 1422   PLT 440 (H) 07/27/2021 0745   PLT 236 09/26/2020 1422   MCV 97.1 07/27/2021 0745   MCV 96 09/26/2020 1422   MCH 31.6 07/27/2021 0745   MCHC 32.5 07/27/2021 0745   RDW 13.1 07/27/2021 0745   RDW 12.6 09/26/2020 1422   LYMPHSABS 1.9 07/24/2021 0352   LYMPHSABS 2.4 09/26/2020 1422   MONOABS 0.6 07/24/2021 0352   EOSABS 0.4 07/24/2021 0352   EOSABS 0.3  09/26/2020 1422   BASOSABS 0.1 07/24/2021 0352   BASOSABS 0.1 09/26/2020 1422    CMP     Component Value Date/Time   NA 137 07/26/2021 0614   NA 141 09/26/2020 1422   K 4.3 07/26/2021 0614   CL 104 07/26/2021 0614   CO2 28 07/26/2021 0614   GLUCOSE 101 (H) 07/26/2021 0614   BUN 13 07/26/2021 0614   BUN 8 09/26/2020 1422   CREATININE 0.84 07/26/2021 0614   CALCIUM 8.6 (L) 07/26/2021 0614   PROT 5.2 (L) 07/13/2021 1645   PROT 6.4 09/26/2020 1422   ALBUMIN 2.6 (L) 07/13/2021 1645   ALBUMIN 4.1 09/26/2020 1422   AST 73 (H) 07/13/2021 1645   ALT 47 (H) 07/13/2021 1645   ALKPHOS 65 07/13/2021 1645   BILITOT 1.8 (H) 07/13/2021 1645   BILITOT 0.9 09/26/2020 1422   GFRNONAA >60 07/26/2021 0614   GFRAA 110 04/12/2019 1452       PENDING LABS:   RADIOGRAPHIC STUDIES:  No results found.   PATHOLOGY:     ASSESSMENT and THERAPY PLAN:   No problem-specific Assessment & Plan notes found for this encounter.   No orders of the defined types were placed in this encounter.   All questions were answered. The patient knows to call the clinic with any problems, questions or concerns. We can certainly see the patient much sooner if necessary. This note was electronically signed. Scot Dock, NP 08/26/2021

## 2021-08-27 ENCOUNTER — Inpatient Hospital Stay: Payer: Medicare Other | Attending: Adult Health | Admitting: Adult Health

## 2021-10-09 ENCOUNTER — Ambulatory Visit: Payer: Medicare Other | Admitting: Neurology

## 2021-10-09 ENCOUNTER — Encounter: Payer: Self-pay | Admitting: Neurology

## 2021-10-09 NOTE — Progress Notes (Deleted)
? ? ?PATIENT: Dale Edwards ?DOB: Mar 01, 1956 ? ?REASON FOR VISIT: Follow up for MS ?HISTORY FROM: Patient ?PRIMARY NEUROLOGIST: Dr.Yan  ? ?HISTORY  ?He presented with right hand difficulty since 2010, he complains of right hand shaking, especially when he trying to use his right hand, such as holding a utensil, he has to hold his spoon like a child to prevent the food from spitting out, progressively worse over the past one year, he also noticed right leg shaking, especially when he bearing weight since 2013, mild gait difficulty dragging his right leg, he suffered a sudden fell to his right leg in 2007, with right tibial fracture,   ?He has chronic low back pain, but denies significant shooting pain from back to his lower extremity, he denies bilateral extremity paresthesia, he denies bowel and bladder ?  ?MRI brain 2014 showed mild-moderate periventricular and subcortical round and ovoid T2 hyperintensities, some of which are confluent.   ?MRI cervical 2014, spine At C2-C3, there is posterior central T2 hyperintense spinal cord lesion.   ?Above findings are consistent with multiple sclerosis.   ?  ?He has no clear clinical relapsing and remitting, over the past few years, continue gradually worsening gait difficulty especially the right arm and the right leg difficulty.   ?  ?Laboratory evaluation showed normal or negative vitamin B12, RPR, folic acid, ANA, HIV, C. reactive protein, TSH, CPK, ?Normal or negative Lyme titer, ACE level,   hepatitis panel,   ?  ?Spinal fluid testing showed WBC 1, glucose 44, total protein of 56, more than 5 oligoclonal banding, and increased IgG synthetic rate, increased IgG index,   ?  ?He had a past medical history of chronic low back pain, right leg pain, knee pain, COPD, has been on disability since March 2014 due to physical difficulty, right side difficulty ?  ?He was started on tecfidera since August  2014.   ?JC virus was positive with titer of 0.8 4 in August 01 2014  ?   ?He was switched to Tysabri IV infusion since February 2018,   ?  ?There was no significant flareups over the years, he has slow decline in his functional status, in creased right hand shaking, gait abnormalities ?  ?Personally reviewed MRI of the brain with without contrast October 2020, stable moderate supratentorium MS plaque, no contrast-enhancement, ?  ?MRI of the cervical spine in May 2016, ill-defined spinal cord hyperintensity posterior at C2-3, likely chronic demyelinating plaque, no enhancing lesions, mild degenerative changes ?  ?He has lost his significant other since March 2022, no longer has transportation, was not able to continue his Tysabri infusion since March, he did not notice any significant decline in his functional status, ? ?Update October 09, 2021 SS:  ?  ? ?REVIEW OF SYSTEMS: Out of a complete 14 system review of symptoms, the patient complains only of the following symptoms, and all other reviewed systems are negative. ? ?ALLERGIES: ?Allergies  ?Allergen Reactions  ? Hydrocodone-Acetaminophen Itching and Nausea Only  ?     ? ? ?HOME MEDICATIONS: ?Outpatient Medications Prior to Visit  ?Medication Sig Dispense Refill  ? acetaminophen (TYLENOL) 325 MG tablet Take 2 tablets (650 mg total) by mouth every 4 (four) hours as needed. 100 tablet 2  ? albuterol (VENTOLIN HFA) 108 (90 Base) MCG/ACT inhaler Inhale 2 puffs into the lungs every 6 (six) hours as needed for wheezing or shortness of breath. 8.5 g 1  ? folic acid (FOLVITE) 1 MG tablet Take 1 tablet (  1 mg total) by mouth daily. 30 tablet 1  ? nicotine (NICODERM CQ - DOSED IN MG/24 HOURS) 21 mg/24hr patch Place 1 patch (21 mg total) onto the skin daily. 28 patch 0  ? thiamine 100 MG tablet Take 1 tablet (100 mg total) by mouth daily. 30 tablet 1  ? ?No facility-administered medications prior to visit.  ? ? ?PAST MEDICAL HISTORY: ?Past Medical History:  ?Diagnosis Date  ? Back pain   ? COPD (chronic obstructive pulmonary disease) (HCC)   ?  Multiple sclerosis (HCC)   ? Tremors of nervous system   ? Right  ? ? ?PAST SURGICAL HISTORY: ?Past Surgical History:  ?Procedure Laterality Date  ? ADENOIDECTOMY    ? broken leg    ? TONSILLECTOMY    ? ? ?FAMILY HISTORY: ?Family History  ?Problem Relation Age of Onset  ? Heart Problems Mother   ? ? ?SOCIAL HISTORY: ?Social History  ? ?Socioeconomic History  ? Marital status: Single  ?  Spouse name: Not on file  ? Number of children: 0  ? Years of education: GED  ? Highest education level: Not on file  ?Occupational History  ?  Employer: UNEMPLOYED  ?  Comment: Disabled  ?Tobacco Use  ? Smoking status: Every Day  ?  Packs/day: 0.50  ?  Types: Cigarettes  ? Smokeless tobacco: Never  ? Tobacco comments:  ?  Half Pack  ?Substance and Sexual Activity  ? Alcohol use: Yes  ?  Alcohol/week: 1.0 standard drink  ?  Types: 1 Standard drinks or equivalent per week  ?  Comment: quit 2 years ago..   pt is now drinking on occ - very rare maybe once a month.  ? Drug use: No  ? Sexual activity: Not on file  ?Other Topics Concern  ? Not on file  ?Social History Narrative  ? Patient lives at home with Rogene Houston.  ? Patient does not have a significant other.   ? Patient is disabled.  ? Patient has no children.   ? Patient has his GED  ? Right handed.  ? Caffeine - four glasses and some sodas  ? ?Social Determinants of Health  ? ?Financial Resource Strain: Not on file  ?Food Insecurity: Not on file  ?Transportation Needs: Not on file  ?Physical Activity: Not on file  ?Stress: Not on file  ?Social Connections: Not on file  ?Intimate Partner Violence: Not on file  ? ? ? ? ?PHYSICAL EXAM ? ?There were no vitals filed for this visit. ?There is no height or weight on file to calculate BMI. ? ?Generalized: Well developed, in no acute distress  ? ?Neurological examination  ?Mentation: Alert oriented to time, place, history taking. Follows all commands speech and language fluent ?Cranial nerve II-XII: Pupils were equal round reactive to  light. Extraocular movements were full, visual field were full on confrontational test. Facial sensation and strength were normal. Uvula tongue midline. Head turning and shoulder shrug  were normal and symmetric. ?Motor: The motor testing reveals 5 over 5 strength of all 4 extremities. Good symmetric motor tone is noted throughout.  ?Sensory: Sensory testing is intact to soft touch on all 4 extremities. No evidence of extinction is noted.  ?Coordination: Cerebellar testing reveals good finger-nose-finger and heel-to-shin bilaterally.  ?Gait and station: Gait is normal. Tandem gait is normal. Romberg is negative. No drift is seen.  ?Reflexes: Deep tendon reflexes are symmetric and normal bilaterally.  ? ?DIAGNOSTIC DATA (LABS, IMAGING, TESTING) ?- I  reviewed patient records, labs, notes, testing and imaging myself where available. ? ?Lab Results  ?Component Value Date  ? WBC 6.8 07/27/2021  ? HGB 14.2 07/27/2021  ? HCT 43.7 07/27/2021  ? MCV 97.1 07/27/2021  ? PLT 440 (H) 07/27/2021  ? ?   ?Component Value Date/Time  ? NA 137 07/26/2021 0614  ? NA 141 09/26/2020 1422  ? K 4.3 07/26/2021 0614  ? CL 104 07/26/2021 0614  ? CO2 28 07/26/2021 0614  ? GLUCOSE 101 (H) 07/26/2021 2202  ? BUN 13 07/26/2021 0614  ? BUN 8 09/26/2020 1422  ? CREATININE 0.84 07/26/2021 0614  ? CALCIUM 8.6 (L) 07/26/2021 5427  ? PROT 5.2 (L) 07/13/2021 1645  ? PROT 6.4 09/26/2020 1422  ? ALBUMIN 2.6 (L) 07/13/2021 1645  ? ALBUMIN 4.1 09/26/2020 1422  ? AST 73 (H) 07/13/2021 1645  ? ALT 47 (H) 07/13/2021 1645  ? ALKPHOS 65 07/13/2021 1645  ? BILITOT 1.8 (H) 07/13/2021 1645  ? BILITOT 0.9 09/26/2020 1422  ? GFRNONAA >60 07/26/2021 0623  ? GFRAA 110 04/12/2019 1452  ? ?Lab Results  ?Component Value Date  ? CHOL 231 (H) 01/08/2013  ? HDL 32 (L) 01/08/2013  ? LDLCALC 146 (H) 01/08/2013  ? TRIG 267 (H) 01/08/2013  ? CHOLHDL 7.2 (H) 01/08/2013  ? ?No results found for: HGBA1C ?Lab Results  ?Component Value Date  ? JSEGBTDV76 228 01/08/2013  ? ?Lab  Results  ?Component Value Date  ? TSH 4.360 09/26/2020  ? ? ? ? ?ASSESSMENT AND PLAN ?66 y.o. year old male  ? ?1.  Multiple sclerosis ? ? ? ? ?Margie Ege, AGNP-C, DNP 10/09/2021, 5:32 AM ?[facility]

## 2021-10-10 ENCOUNTER — Emergency Department (HOSPITAL_COMMUNITY): Payer: Medicare Other

## 2021-10-10 ENCOUNTER — Emergency Department (HOSPITAL_COMMUNITY)
Admission: EM | Admit: 2021-10-10 | Discharge: 2021-10-11 | Disposition: A | Discharge status: Home / Self Care | Payer: Medicare Other | Attending: Emergency Medicine | Admitting: Emergency Medicine

## 2021-10-10 DIAGNOSIS — W1839XA Other fall on same level, initial encounter: Secondary | ICD-10-CM | POA: Insufficient documentation

## 2021-10-10 DIAGNOSIS — Z20822 Contact with and (suspected) exposure to covid-19: Secondary | ICD-10-CM | POA: Diagnosis not present

## 2021-10-10 DIAGNOSIS — Z79899 Other long term (current) drug therapy: Secondary | ICD-10-CM | POA: Diagnosis not present

## 2021-10-10 DIAGNOSIS — F10129 Alcohol abuse with intoxication, unspecified: Secondary | ICD-10-CM | POA: Insufficient documentation

## 2021-10-10 DIAGNOSIS — S0083XA Contusion of other part of head, initial encounter: Secondary | ICD-10-CM | POA: Diagnosis not present

## 2021-10-10 DIAGNOSIS — Y908 Blood alcohol level of 240 mg/100 ml or more: Secondary | ICD-10-CM | POA: Insufficient documentation

## 2021-10-10 DIAGNOSIS — R0602 Shortness of breath: Secondary | ICD-10-CM | POA: Insufficient documentation

## 2021-10-10 DIAGNOSIS — R1032 Left lower quadrant pain: Secondary | ICD-10-CM | POA: Diagnosis not present

## 2021-10-10 DIAGNOSIS — S0081XA Abrasion of other part of head, initial encounter: Secondary | ICD-10-CM | POA: Insufficient documentation

## 2021-10-10 DIAGNOSIS — Z23 Encounter for immunization: Secondary | ICD-10-CM | POA: Insufficient documentation

## 2021-10-10 MED ORDER — ALBUTEROL SULFATE HFA 108 (90 BASE) MCG/ACT IN AERS
2.0000 | INHALATION_SPRAY | RESPIRATORY_TRACT | Status: DC | PRN
  Filled 2021-10-10: qty 6.7

## 2021-10-10 NOTE — ED Triage Notes (Signed)
Pt arrives to ED BIB [facility] c/o [facility]. Per EMS pt states he has COPD and is supposed to be on O2 but does not use it because he does not like how the nasal canula feels. Pt A/O x4. No Resp Distress at this time. ? ?BP 146 Palpated ?HR 99 ?O2 99% RA  ?

## 2021-10-11 ENCOUNTER — Emergency Department (HOSPITAL_COMMUNITY)
Admission: EM | Admit: 2021-10-11 | Discharge: 2021-10-12 | Disposition: A | Discharge status: Home / Self Care | Payer: Medicare Other | Source: Home / Self Care | Attending: Emergency Medicine | Admitting: Emergency Medicine

## 2021-10-11 ENCOUNTER — Emergency Department (HOSPITAL_COMMUNITY): Payer: Medicare Other

## 2021-10-11 DIAGNOSIS — S0081XA Abrasion of other part of head, initial encounter: Secondary | ICD-10-CM | POA: Insufficient documentation

## 2021-10-11 DIAGNOSIS — R0602 Shortness of breath: Secondary | ICD-10-CM | POA: Diagnosis not present

## 2021-10-11 DIAGNOSIS — F10129 Alcohol abuse with intoxication, unspecified: Secondary | ICD-10-CM | POA: Insufficient documentation

## 2021-10-11 DIAGNOSIS — Z23 Encounter for immunization: Secondary | ICD-10-CM | POA: Insufficient documentation

## 2021-10-11 DIAGNOSIS — S0083XA Contusion of other part of head, initial encounter: Secondary | ICD-10-CM | POA: Insufficient documentation

## 2021-10-11 DIAGNOSIS — F10920 Alcohol use, unspecified with intoxication, uncomplicated: Secondary | ICD-10-CM

## 2021-10-11 DIAGNOSIS — W19XXXA Unspecified fall, initial encounter: Secondary | ICD-10-CM

## 2021-10-11 DIAGNOSIS — Y908 Blood alcohol level of 240 mg/100 ml or more: Secondary | ICD-10-CM | POA: Insufficient documentation

## 2021-10-11 DIAGNOSIS — Z79899 Other long term (current) drug therapy: Secondary | ICD-10-CM | POA: Insufficient documentation

## 2021-10-11 DIAGNOSIS — W1839XA Other fall on same level, initial encounter: Secondary | ICD-10-CM | POA: Insufficient documentation

## 2021-10-11 LAB — COMPREHENSIVE METABOLIC PANEL
ALT: 16 U/L (ref 0–44)
AST: 17 U/L (ref 15–41)
Albumin: 3.6 g/dL (ref 3.5–5.0)
Alkaline Phosphatase: 67 U/L (ref 38–126)
Anion gap: 13 (ref 5–15)
BUN: 14 mg/dL (ref 8–23)
CO2: 21 mmol/L — ABNORMAL LOW (ref 22–32)
Calcium: 8.7 mg/dL — ABNORMAL LOW (ref 8.9–10.3)
Chloride: 106 mmol/L (ref 98–111)
Creatinine, Ser: 0.53 mg/dL — ABNORMAL LOW (ref 0.61–1.24)
GFR, Estimated: >60 mL/min (ref >60)
Glucose, Bld: 94 mg/dL (ref 70–99)
Potassium: 3.9 mmol/L (ref 3.5–5.1)
Sodium: 140 mmol/L (ref 135–145)
Total Bilirubin: 0.4 mg/dL (ref 0.3–1.2)
Total Protein: 6.2 g/dL — ABNORMAL LOW (ref 6.5–8.1)

## 2021-10-11 LAB — CBC WITH DIFFERENTIAL/PLATELET
Abs Immature Granulocytes: 0.02 10*3/uL (ref 0.00–0.07)
Basophils Absolute: 0.1 10*3/uL (ref 0.0–0.1)
Basophils Relative: 1 %
Eosinophils Absolute: 0.1 10*3/uL (ref 0.0–0.5)
Eosinophils Relative: 1 %
HCT: 47 % (ref 39.0–52.0)
Hemoglobin: 15.9 g/dL (ref 13.0–17.0)
Immature Granulocytes: 0 %
Lymphocytes Relative: 26 %
Lymphs Abs: 2.1 10*3/uL (ref 0.7–4.0)
MCH: 32.5 pg (ref 26.0–34.0)
MCHC: 33.8 g/dL (ref 30.0–36.0)
MCV: 96.1 fL (ref 80.0–100.0)
Monocytes Absolute: 0.5 10*3/uL (ref 0.1–1.0)
Monocytes Relative: 7 %
Neutro Abs: 5.4 10*3/uL (ref 1.7–7.7)
Neutrophils Relative %: 65 %
Platelets: 271 10*3/uL (ref 150–400)
RBC: 4.89 MIL/uL (ref 4.22–5.81)
RDW: 12.6 % (ref 11.5–15.5)
WBC: 8.2 10*3/uL (ref 4.0–10.5)
nRBC: 0 % (ref 0.0–0.2)

## 2021-10-11 LAB — LIPASE, BLOOD: Lipase: 36 U/L (ref 11–51)

## 2021-10-11 LAB — RESP PANEL BY RT-PCR (FLU A&B, COVID) ARPGX2
Influenza A by PCR: NEGATIVE
Influenza B by PCR: NEGATIVE
SARS Coronavirus 2 by RT PCR: NEGATIVE

## 2021-10-11 MED ORDER — METHYLPREDNISOLONE SODIUM SUCC 125 MG IJ SOLR
125.0000 mg | Freq: Once | INTRAMUSCULAR | Status: AC
  Administered 2021-10-11: 125 mg via INTRAVENOUS
  Filled 2021-10-11: qty 2

## 2021-10-11 MED ORDER — PREDNISONE 50 MG PO TABS: 50.0000 mg | ORAL_TABLET | Freq: Every day | ORAL | 0 refills | Status: DC

## 2021-10-11 MED ORDER — IOHEXOL 350 MG/ML SOLN: 100.0000 mL | Freq: Once | INTRAVENOUS | Status: DC | PRN

## 2021-10-11 MED ORDER — TETANUS-DIPHTH-ACELL PERTUSSIS 5-2.5-18.5 LF-MCG/0.5 IM SUSY
0.5000 mL | PREFILLED_SYRINGE | Freq: Once | INTRAMUSCULAR | Status: AC
  Administered 2021-10-12: 0.5 mL via INTRAMUSCULAR
  Filled 2021-10-11: qty 0.5

## 2021-10-11 MED ORDER — IOHEXOL 9 MG/ML PO SOLN
ORAL | Status: AC
  Filled 2021-10-11: qty 1000

## 2021-10-11 MED ORDER — IPRATROPIUM-ALBUTEROL 0.5-2.5 (3) MG/3ML IN SOLN
3.0000 mL | Freq: Once | RESPIRATORY_TRACT | Status: AC
  Administered 2021-10-11: 3 mL via RESPIRATORY_TRACT
  Filled 2021-10-11: qty 3

## 2021-10-11 MED ORDER — IOHEXOL 300 MG/ML  SOLN
100.0000 mL | Freq: Once | INTRAMUSCULAR | Status: AC | PRN
  Administered 2021-10-11: 100 mL via INTRAVENOUS

## 2021-10-11 NOTE — ED Triage Notes (Signed)
Pt brought to ED for evaluation of fall after drinking this evening. Pt has abrasion/small laceration to left side of face near eye and laceration above right eye.  ?

## 2021-10-11 NOTE — ED Provider Notes (Signed)
?[facility] EMERGENCY DEPARTMENT ?Provider Note ? ? ?CSN: [id number] ?Arrival date & time: 10/10/21  2329 ? ?  ? ?History ? ?Chief Complaint  ?Patient presents with  ? Shortness of Breath  ? ? ?Dale Edwards is a 66 y.o. male. ? ?The history is provided by the patient.  ?Shortness of Breath ?He has history of emphysema, multiple sclerosis and comes in complaining of shortness of breath since yesterday and left-sided abdominal pain which started this morning.  He states that he cannot find his inhaler and normally he would use the inhaler when he feels like this.  He has had a cough which is nonproductive.  He denies fever, chills, sweats.  He denies nausea or vomiting or diarrhea.  He denies any chest pain, heaviness, tightness, pressure. ?  ?Home Medications ?Prior to Admission medications   ?Medication Sig Start Date End Date Taking? Authorizing Provider  ?acetaminophen (TYLENOL) 325 MG tablet Take 2 tablets (650 mg total) by mouth every 4 (four) hours as needed. 07/26/21 07/26/22  Burnadette Pop, MD  ?albuterol (VENTOLIN HFA) 108 (90 Base) MCG/ACT inhaler Inhale 2 puffs into the lungs every 6 (six) hours as needed for wheezing or shortness of breath. 07/27/21   Regalado, Belkys A, MD  ?folic acid (FOLVITE) 1 MG tablet Take 1 tablet (1 mg total) by mouth daily. 07/27/21   Regalado, Belkys A, MD  ?nicotine (NICODERM CQ - DOSED IN MG/24 HOURS) 21 mg/24hr patch Place 1 patch (21 mg total) onto the skin daily. 07/27/21   Burnadette Pop, MD  ?thiamine 100 MG tablet Take 1 tablet (100 mg total) by mouth daily. 07/27/21   Regalado, Prentiss Bells, MD  ?   ? ?Allergies    ?Hydrocodone-acetaminophen   ? ?Review of Systems   ?Review of Systems  ?Respiratory:  Positive for shortness of breath.   ?All other systems reviewed and are negative. ? ?Physical Exam ?Updated Vital Signs ?BP 111/75   Pulse 83   Temp 98 ?F (36.7 ?C) (Oral)   Resp 18   SpO2 96%  ?Physical Exam ?Vitals and nursing note reviewed.  ?66  year old male, resting comfortably and in no acute distress. Vital signs are normal. Oxygen saturation is 96%, which is normal. ?Head is normocephalic and atraumatic. PERRLA, EOMI. Oropharynx is clear. ?Neck is nontender and supple without adenopathy or JVD. ?Back is nontender and there is no CVA tenderness. ?Lungs have diminished airflow throughout with faint expiratory wheezes bilaterally.  There are no rales or rhonchi. ?Chest is nontender. ?Heart has regular rate and rhythm without murmur. ?Abdomen is soft, flat, with mild to moderate left mid and lower abdomen.  There is no rebound or guarding.  There are no masses or hepatosplenomegaly and peristalsis is normoactive. ?Extremities have no cyanosis or edema, full range of motion is present. ?Skin is warm and dry without rash. ?Neurologic: Mental status is normal, cranial nerves are intact, moves all extremities equally. ? ?ED Results / Procedures / Treatments   ?Labs ?(all labs ordered are listed, but only abnormal results are displayed) ?Labs Reviewed  ?RESP PANEL BY RT-PCR (FLU A&B, COVID) ARPGX2  ?COMPREHENSIVE METABOLIC PANEL  ?LIPASE, BLOOD  ?CBC WITH DIFFERENTIAL/PLATELET  ? ? ?EKG ?EKG Interpretation ? ?Date/Time:  Wednesday October 10 2021 23:32:13 EDT ?Ventricular Rate:  84 ?PR Interval:  126 ?QRS Duration: 78 ?QT Interval:  378 ?QTC Calculation: 446 ?R Axis:   81 ?Text Interpretation: Normal sinus rhythm Cannot rule out Anterior infarct , age undetermined Abnormal ECG  When compared with ECG of 27-Jul-2021 08:52, No significant change was found Confirmed by Dione Booze ([id number]) on 10/11/2021 12:32:34 AM ? ?Radiology ?DG Chest 2 View ? ?Result Date: 10/11/2021 ?CLINICAL DATA:  Shortness of breath. EXAM: CHEST - 2 VIEW COMPARISON:  Chest x-ray 07/23/2021. CT of the chest 07/12/2021. Right pleural thickening and blunting of the right costophrenic angle appears stable. There are interstitial opacities throughout the left lung, mildly decreased from prior. There  is right upper lobe peripheral parenchymal opacities similar to the prior CT. The cardiomediastinal silhouette is within normal limits. No evidence for pneumothorax. No acute fractures are seen. FINDINGS: 1. Interstitial opacities throughout the left lung have decreased from prior and may represent interstitial lung disease or edema/infection. 2. There are stable chronic changes throughout the right hemithorax. IMPRESSION: No active cardiopulmonary disease. Electronically Signed   By: Darliss Cheney M.D.   On: 10/11/2021 00:01   ? ?Procedures ?Procedures  ? ? ?Medications Ordered in ED ?Medications  ?albuterol (VENTOLIN HFA) 108 (90 Base) MCG/ACT inhaler 2 puff (has no administration in time range)  ?ipratropium-albuterol (DUONEB) 0.5-2.5 (3) MG/3ML nebulizer solution 3 mL (has no administration in time range)  ? ? ?ED Course/ Medical Decision Making/ A&P ?  ?                        ?Medical Decision Making ?Amount and/or Complexity of Data Reviewed ?Labs: ordered. ?Radiology: ordered. ? ?Risk ?Prescription drug management. ? ? ?COPD exacerbation.  He will be given albuterol with ipratropium nebulizer treatment and also a dose of steroids.  Chest x-ray was obtained to rule out pneumonia and shows improvement in chronic changes of the left lung, no acute process.  I have independently viewed the images, and agree with radiologist's interpretation.  ECG shows no acute changes.  Abdominal pain does not appear to be severe, but is in new finding for him.  We will send for CT of abdomen and pelvis to rule out diverticulitis or other serious intra-abdominal pathology. ? ? ? ? ? ? ? ?Final Clinical Impression(s) / ED Diagnoses ?Final diagnoses:  ?None  ? ? ?Rx / DC Orders ?ED Discharge Orders   ? ? None  ? ?  ? ? ?  ?Dione Booze, MD ?10/11/21 [id number] ? ?

## 2021-10-11 NOTE — ED Notes (Signed)
Patient transported to CT 

## 2021-10-11 NOTE — ED Provider Triage Note (Signed)
Emergency Medicine Provider Triage Evaluation Note ? ?Esperanza Richters , a 66 y.o. male  was evaluated in triage.  Pt complains of head injury.  Picked up from [facility].  Has EtOH on board and apparently fell.  Unclear of loss of consciousness.  Not on anticoagulation. ? ?Review of Systems  ?Positive: Fall, head injury ?Negative: fever ? ?Physical Exam  ?BP 127/78   Pulse 80   Temp 98.2 ?F (36.8 ?C)   Resp 17   SpO2 98%  ? ?Gen:   Awake, no distress   ?Resp:  Normal effort  ?MSK:   Moves extremities without difficulty  ?Other:  Wounds noted to bilateral forehead, mild bleeding, awake/alert, able to state name and DOB ? ?Medical Decision Making  ?Medically screening exam initiated at 11:16 PM.  Appropriate orders placed.  Alfard Cochrane was informed that the remainder of the evaluation will be completed by another provider, this initial triage assessment does not replace that evaluation, and the importance of remaining in the ED until their evaluation is complete. ? ?Fall.  EtOH on board.  Does have signs of head trauma on exam, some mild bleeding from wounds on forehead.  He is awake/alert and answering questions.  Will get labs, CT head/neck.  Will update tetanus. ?  ?Garlon Hatchet, PA-C ?10/11/21 2323 ? ?

## 2021-10-11 NOTE — Discharge Instructions (Addendum)
Use your inhaler as needed. ? ?Return if symptoms or not being adequately controlled at home. ?

## 2021-10-12 ENCOUNTER — Emergency Department (HOSPITAL_COMMUNITY): Payer: Medicare Other

## 2021-10-12 DIAGNOSIS — R0602 Shortness of breath: Secondary | ICD-10-CM | POA: Diagnosis not present

## 2021-10-12 LAB — COMPREHENSIVE METABOLIC PANEL
ALT: 18 U/L (ref 0–44)
AST: 19 U/L (ref 15–41)
Albumin: 4 g/dL (ref 3.5–5.0)
Alkaline Phosphatase: 87 U/L (ref 38–126)
Anion gap: 17 — ABNORMAL HIGH (ref 5–15)
BUN: 16 mg/dL (ref 8–23)
CO2: 18 mmol/L — ABNORMAL LOW (ref 22–32)
Calcium: 9 mg/dL (ref 8.9–10.3)
Chloride: 104 mmol/L (ref 98–111)
Creatinine, Ser: 0.71 mg/dL (ref 0.61–1.24)
GFR, Estimated: >60 mL/min (ref >60)
Glucose, Bld: 105 mg/dL — ABNORMAL HIGH (ref 70–99)
Potassium: 4 mmol/L (ref 3.5–5.1)
Sodium: 139 mmol/L (ref 135–145)
Total Bilirubin: 0.9 mg/dL (ref 0.3–1.2)
Total Protein: 6.6 g/dL (ref 6.5–8.1)

## 2021-10-12 LAB — RAPID URINE DRUG SCREEN, HOSP PERFORMED
Amphetamines: NOT DETECTED
Barbiturates: NOT DETECTED
Benzodiazepines: NOT DETECTED
Cocaine: NOT DETECTED
Opiates: NOT DETECTED
Tetrahydrocannabinol: NOT DETECTED

## 2021-10-12 LAB — CBC WITH DIFFERENTIAL/PLATELET
Abs Immature Granulocytes: 0.04 10*3/uL (ref 0.00–0.07)
Basophils Absolute: 0 10*3/uL (ref 0.0–0.1)
Basophils Relative: 0 %
Eosinophils Absolute: 0 10*3/uL (ref 0.0–0.5)
Eosinophils Relative: 0 %
HCT: 48 % (ref 39.0–52.0)
Hemoglobin: 16.5 g/dL (ref 13.0–17.0)
Immature Granulocytes: 0 %
Lymphocytes Relative: 11 %
Lymphs Abs: 1.1 10*3/uL (ref 0.7–4.0)
MCH: 32.5 pg (ref 26.0–34.0)
MCHC: 34.4 g/dL (ref 30.0–36.0)
MCV: 94.7 fL (ref 80.0–100.0)
Monocytes Absolute: 0.6 10*3/uL (ref 0.1–1.0)
Monocytes Relative: 6 %
Neutro Abs: 8.6 10*3/uL — ABNORMAL HIGH (ref 1.7–7.7)
Neutrophils Relative %: 83 %
Platelets: 299 10*3/uL (ref 150–400)
RBC: 5.07 MIL/uL (ref 4.22–5.81)
RDW: 12.8 % (ref 11.5–15.5)
WBC: 10.4 10*3/uL (ref 4.0–10.5)
nRBC: 0 % (ref 0.0–0.2)

## 2021-10-12 LAB — ETHANOL: Alcohol, Ethyl (B): 258 mg/dL — ABNORMAL HIGH (ref <10)

## 2021-10-12 MED ORDER — BACITRACIN ZINC 500 UNIT/GM EX OINT
TOPICAL_OINTMENT | CUTANEOUS | Status: AC
  Administered 2021-10-12: 2 via TOPICAL
  Filled 2021-10-12: qty 1.8

## 2021-10-12 NOTE — ED Provider Notes (Addendum)
?[facility] EMERGENCY DEPARTMENT ?Provider Note ? ? ?CSN: [id number] ?Arrival date & time: 10/11/21  2316 ? ?  ? ?History ? ?Chief Complaint  ?Patient presents with  ? Fall  ? Alcohol Intoxication  ? ? ?Kamir Selover is a 66 y.o. male. ? ?The history is provided by the patient and medical records.  ?Fall ? ?Alcohol Intoxication ? ? ?66 y.o. male presenting to the ED acutely intoxicated.  He was picked up from [facility] after a fall.  He did have head injury, unclear if loss of consciousness.  Not currently on anticoagulation.  Patient states "I am fine, I fall all the time". ? ?Home Medications ?Prior to Admission medications   ?Medication Sig Start Date End Date Taking? Authorizing Provider  ?acetaminophen (TYLENOL) 325 MG tablet Take 2 tablets (650 mg total) by mouth every 4 (four) hours as needed. 07/26/21 07/26/22  Burnadette Pop, MD  ?albuterol (VENTOLIN HFA) 108 (90 Base) MCG/ACT inhaler Inhale 2 puffs into the lungs every 6 (six) hours as needed for wheezing or shortness of breath. 07/27/21   Regalado, Belkys A, MD  ?folic acid (FOLVITE) 1 MG tablet Take 1 tablet (1 mg total) by mouth daily. 07/27/21   Regalado, Belkys A, MD  ?nicotine (NICODERM CQ - DOSED IN MG/24 HOURS) 21 mg/24hr patch Place 1 patch (21 mg total) onto the skin daily. 07/27/21   Burnadette Pop, MD  ?predniSONE (DELTASONE) 50 MG tablet Take 1 tablet (50 mg total) by mouth daily. 10/11/21   Dione Booze, MD  ?thiamine 100 MG tablet Take 1 tablet (100 mg total) by mouth daily. 07/27/21   Regalado, Prentiss Bells, MD  ?   ? ?Allergies    ?Hydrocodone-acetaminophen   ? ?Review of Systems   ?Review of Systems  ?Constitutional:   ?     Fall, EtOH  ?All other systems reviewed and are negative. ? ?Physical Exam ?Updated Vital Signs ?BP 135/77   Pulse 80   Temp 98.2 ?F (36.8 ?C)   Resp 18   SpO2 99%  ? ?Physical Exam ?Vitals and nursing note reviewed.  ?Constitutional:   ?   Appearance: He is well-developed.  ?   Comments: Awake, appears  intoxicated  ?HENT:  ?   Head: Normocephalic and atraumatic.  ?   Comments: Multiple contusions/abrasions noted to forehead, dried blood present without active bleeding ?Eyes:  ?   Conjunctiva/sclera: Conjunctivae normal.  ?   Pupils: Pupils are equal, round, and reactive to light.  ?Cardiovascular:  ?   Rate and Rhythm: Normal rate and regular rhythm.  ?   Heart sounds: Normal heart sounds.  ?Pulmonary:  ?   Effort: Pulmonary effort is normal.  ?   Breath sounds: Normal breath sounds.  ?Abdominal:  ?   General: Bowel sounds are normal.  ?   Palpations: Abdomen is soft.  ?Musculoskeletal:     ?   General: Normal range of motion.  ?   Cervical back: Normal range of motion.  ?Skin: ?   General: Skin is warm and dry.  ?Neurological:  ?   Mental Status: He is alert.  ?   Comments: Awake, alert but intoxicated, able to answer questions and following commands, moving extremities when prompted, no focal deficits  ? ? ?ED Results / Procedures / Treatments   ?Labs ?(all labs ordered are listed, but only abnormal results are displayed) ?Labs Reviewed  ?CBC WITH DIFFERENTIAL/PLATELET - Abnormal; Notable for the following components:  ?    Result Value  ?  Neutro Abs 8.6 (*)   ? All other components within normal limits  ?COMPREHENSIVE METABOLIC PANEL - Abnormal; Notable for the following components:  ? CO2 18 (*)   ? Glucose, Bld 105 (*)   ? Anion gap 17 (*)   ? All other components within normal limits  ?ETHANOL - Abnormal; Notable for the following components:  ? Alcohol, Ethyl (B) 258 (*)   ? All other components within normal limits  ?RAPID URINE DRUG SCREEN, HOSP PERFORMED  ? ? ?EKG ?None ? ?Radiology ?DG Chest 2 View ? ?Result Date: 10/11/2021 ?CLINICAL DATA:  Shortness of breath. EXAM: CHEST - 2 VIEW COMPARISON:  Chest x-ray 07/23/2021. CT of the chest 07/12/2021. Right pleural thickening and blunting of the right costophrenic angle appears stable. There are interstitial opacities throughout the left lung, mildly  decreased from prior. There is right upper lobe peripheral parenchymal opacities similar to the prior CT. The cardiomediastinal silhouette is within normal limits. No evidence for pneumothorax. No acute fractures are seen. FINDINGS: 1. Interstitial opacities throughout the left lung have decreased from prior and may represent interstitial lung disease or edema/infection. 2. There are stable chronic changes throughout the right hemithorax. IMPRESSION: No active cardiopulmonary disease. Electronically Signed   By: Darliss Cheney M.D.   On: 10/11/2021 00:01  ? ?CT ABDOMEN PELVIS W CONTRAST ? ?Result Date: 10/11/2021 ?CLINICAL DATA:  Left lower quadrant pain EXAM: CT ABDOMEN AND PELVIS WITH CONTRAST TECHNIQUE: Multidetector CT imaging of the abdomen and pelvis was performed using the standard protocol following bolus administration of intravenous contrast. RADIATION DOSE REDUCTION: This exam was performed according to the departmental dose-optimization program which includes automated exposure control, adjustment of the mA and/or kV according to patient size and/or use of iterative reconstruction technique. CONTRAST:  OMNIPAQUE IOHEXOL 300 MG/ML  SOLN COMPARISON:  07/19/2021 FINDINGS: Lower chest: Unchanged chronic pleural thickening overlying the posterior right lower lobe. Chronic interstitial reticulation is noted within the left base. There are advanced changes of emphysema Hepatobiliary: No focal liver abnormality is seen. No gallstones, gallbladder wall thickening, or biliary dilatation. Pancreas: Unremarkable. No pancreatic ductal dilatation or surrounding inflammatory changes. Spleen: Normal in size without focal abnormality. Adrenals/Urinary Tract: Normal adrenal glands. No kidney mass, hydronephrosis or nephrolithiasis. No signs of hydroureter or ureteral lithiasis. The bladder appears normal. Stomach/Bowel: The stomach is unremarkable. The appendix is visualized and appears normal, image 48/3. There is no  bowel wall thickening, inflammation, or distension. Vascular/Lymphatic: Aortic atherosclerosis. No aneurysm. No abdominopelvic adenopathy. Reproductive: Prostate is unremarkable. Other: No free fluid or fluid collections. Small fat containing umbilical hernia. No signs of pneumoperitoneum. Musculoskeletal: No acute or significant osseous findings. IMPRESSION: 1. No acute findings within the abdomen or pelvis. 2. Aortic Atherosclerosis (ICD10-I70.0) and Emphysema (ICD10-J43.9). Electronically Signed   By: Signa Kell M.D.   On: 10/11/2021 05:31   ? ?Procedures ?Procedures  ? ? ?Medications Ordered in ED ?Medications  ?bacitracin 500 UNIT/GM ointment (has no administration in time range)  ?Tdap (BOOSTRIX) injection 0.5 mL (0.5 mLs Intramuscular Given 10/12/21 0407)  ? ? ?ED Course/ Medical Decision Making/ A&P ?  ?                        ?Medical Decision Making ?Amount and/or Complexity of Data Reviewed ?Labs: ordered. ?Radiology: ordered. ?ECG/medicine tests: ordered and independent interpretation performed. ? ?Risk ?Prescription drug management. ? ? ?66 year old male presenting to the ED after fall at [facility].  Does have EtOH on  board with head trauma.  Unclear if loss of consciousness.  He denies being on anticoagulation.  Patient is awake, alert, but does appear intoxicated.  He is ambulatory in triage, albeit somewhat unsteady.  He does have contusions/abrasions to the forehead.  Unsure of last tetanus.  Given his intoxication, will obtain labs, CT head and neck.  Will update tetanus. ? ?Labs as above, ethanol 258.  CT head and neck reviewed, no acute findings.  Patient observed here for several hours, metabolizing well.  He is eating, drinking, ambulating to restroom with steady gait now.  Tetanus has been updated.  VSS.  Stable for discharge.  Can return here for new concerns. ? ?Final Clinical Impression(s) / ED Diagnoses ?Final diagnoses:  ?Alcoholic intoxication without complication (HCC)  ?Fall, initial  encounter  ? ? ?Rx / DC Orders ?ED Discharge Orders   ? ? None  ? ?  ? ? ?  ?Garlon Hatchet, PA-C ?10/12/21 1017 ? ?  ?Garlon Hatchet, PA-C ?10/12/21 5102 ? ?  ?Geoffery Lyons, MD ?10/12/21 5852 ? ?

## 2021-10-20 ENCOUNTER — Other Ambulatory Visit (HOSPITAL_COMMUNITY): Payer: Self-pay | Admitting: Nurse Practitioner

## 2021-10-20 DIAGNOSIS — G35 Multiple sclerosis: Secondary | ICD-10-CM

## 2021-11-15 ENCOUNTER — Telehealth: Payer: Self-pay | Admitting: Neurology

## 2021-11-15 NOTE — Telephone Encounter (Signed)
I called [employer] back. She will resend the form. Completed, signed by MD and faxed back. Confirmation received.  ?

## 2021-11-15 NOTE — Telephone Encounter (Signed)
Dale Edwards at [employer] to inform office the [employer] forms faxed to the [employer] on 11/08/2021 are missing prescribing Dr. Lenox Ponds and date.  ?Fax: [phone] ? ?

## 2021-11-17 ENCOUNTER — Emergency Department (HOSPITAL_COMMUNITY): Payer: Medicare Other

## 2021-11-17 ENCOUNTER — Emergency Department (HOSPITAL_COMMUNITY)
Admission: EM | Admit: 2021-11-17 | Discharge: 2021-11-17 | Disposition: A | Discharge status: Home / Self Care | Payer: Medicare Other | Attending: Emergency Medicine | Admitting: Emergency Medicine

## 2021-11-17 ENCOUNTER — Other Ambulatory Visit: Payer: Self-pay

## 2021-11-17 ENCOUNTER — Encounter (HOSPITAL_COMMUNITY): Payer: Self-pay | Admitting: *Deleted

## 2021-11-17 DIAGNOSIS — R001 Bradycardia, unspecified: Secondary | ICD-10-CM | POA: Diagnosis not present

## 2021-11-17 DIAGNOSIS — Z7952 Long term (current) use of systemic steroids: Secondary | ICD-10-CM | POA: Diagnosis not present

## 2021-11-17 DIAGNOSIS — R0602 Shortness of breath: Secondary | ICD-10-CM

## 2021-11-17 DIAGNOSIS — Z20822 Contact with and (suspected) exposure to covid-19: Secondary | ICD-10-CM | POA: Insufficient documentation

## 2021-11-17 DIAGNOSIS — J449 Chronic obstructive pulmonary disease, unspecified: Secondary | ICD-10-CM | POA: Insufficient documentation

## 2021-11-17 LAB — CBC WITH DIFFERENTIAL/PLATELET
Abs Immature Granulocytes: 0.03 10*3/uL (ref 0.00–0.07)
Basophils Absolute: 0.1 10*3/uL (ref 0.0–0.1)
Basophils Relative: 1 %
Eosinophils Absolute: 0.3 10*3/uL (ref 0.0–0.5)
Eosinophils Relative: 3 %
HCT: 49.2 % (ref 39.0–52.0)
Hemoglobin: 16.4 g/dL (ref 13.0–17.0)
Immature Granulocytes: 0 %
Lymphocytes Relative: 23 %
Lymphs Abs: 1.7 10*3/uL (ref 0.7–4.0)
MCH: 32.7 pg (ref 26.0–34.0)
MCHC: 33.3 g/dL (ref 30.0–36.0)
MCV: 98 fL (ref 80.0–100.0)
Monocytes Absolute: 0.5 10*3/uL (ref 0.1–1.0)
Monocytes Relative: 7 %
Neutro Abs: 4.9 10*3/uL (ref 1.7–7.7)
Neutrophils Relative %: 66 %
Platelets: 231 10*3/uL (ref 150–400)
RBC: 5.02 MIL/uL (ref 4.22–5.81)
RDW: 13.2 % (ref 11.5–15.5)
WBC: 7.5 10*3/uL (ref 4.0–10.5)
nRBC: 0 % (ref 0.0–0.2)

## 2021-11-17 LAB — BASIC METABOLIC PANEL
Anion gap: 7 (ref 5–15)
BUN: 14 mg/dL (ref 8–23)
CO2: 26 mmol/L (ref 22–32)
Calcium: 9 mg/dL (ref 8.9–10.3)
Chloride: 103 mmol/L (ref 98–111)
Creatinine, Ser: 0.84 mg/dL (ref 0.61–1.24)
GFR, Estimated: >60 mL/min (ref >60)
Glucose, Bld: 96 mg/dL (ref 70–99)
Potassium: 3.7 mmol/L (ref 3.5–5.1)
Sodium: 136 mmol/L (ref 135–145)

## 2021-11-17 LAB — TROPONIN I (HIGH SENSITIVITY)
Troponin I (High Sensitivity): 4 ng/L (ref <18)
Troponin I (High Sensitivity): 4 ng/L (ref <18)

## 2021-11-17 LAB — RESP PANEL BY RT-PCR (FLU A&B, COVID) ARPGX2
Influenza A by PCR: NEGATIVE
Influenza B by PCR: NEGATIVE
SARS Coronavirus 2 by RT PCR: NEGATIVE

## 2021-11-17 NOTE — ED Provider Triage Note (Signed)
Emergency Medicine Provider Triage Evaluation Note ? ?Dale Edwards , a 66 y.o. male  was evaluated in triage.  Pt complains of SOB.  States he was put on home O2 after last hospitalization but now tank is empty and just has concentrator but no purified water to using it.  Also reports he had to leave the hotel that he was staying in because he could not afford it.  He reports cough.  He continues smoking. ? ?Review of Systems  ?Positive: Cough, SOB ?Negative: fever ? ?Physical Exam  ?BP 122/71   Pulse 62   Temp 98.3 ?F (36.8 ?C)   Resp 16   SpO2 96%  ?Gen:   Awake, no distress   ?Resp:  Normal effort  ?MSK:   Moves extremities without difficulty  ?Other:   ? ?Medical Decision Making  ?Medically screening exam initiated at 2:45 AM.  Appropriate orders placed.  Dale Edwards was informed that the remainder of the evaluation will be completed by another provider, this initial triage assessment does not replace that evaluation, and the importance of remaining in the ED until their evaluation is complete. ? ?Cough, SOB.  Not recently been using home O2 but sats stable in triage on RA.  EKG, labs, CXR, covid screen. ?  ?Garlon Hatchet, PA-C ?11/17/21 0246 ? ?

## 2021-11-17 NOTE — ED Triage Notes (Signed)
The pt arrived by gems from the street the pt was in a parking lot   [facility]  c/o difficulty breathing  no visible sob   lungs clear  nothing done by gems ?

## 2021-11-17 NOTE — Discharge Instructions (Addendum)
?  You were seen here today for evaluation of your shortness of breath off and on for the past year.  Your chest x-ray was unchanged and your lab work is normal.  As discussed, please make sure you are using your Symbicort daily as described.  Resources for shelters included in this discharge paperwork.  If you have any new or worsening symptoms, please return the nearest emergency department for evaluation. ? ?If you are in need of a shelter please call [facility] ([facility]) at [phone] between the hours of 9am-5pm Mon-Fri.  [facility] will contact all the local shelters to find openings.  Right now they are requiring people to quarantine at a hotel before they can go to an open bed but [facility] may be able to set that up as well.  They are not open on weekends. ? ?On Monday-Friday morning at 8 am until 1 pm, you can also go to the [employer] (see below) to seek shelter in the [facility] prior to entering a shelter. You can also call the number provided (see the above paragraph) to seek placement into the program by calling [facility]. ? ? Chief Strategy Officer center [facility]) ?[address] ?[place], [place] [address] ?Phone: [phone] ?Fax: [phone] ? ?For Free Breakfasts and Lunches 7 days a week you can go to: ?[employer] [employer] ?[address] [place], [place], [place] [address] ?Hours: Open today ? 8AM-5PM ?Phone: [phone] ?Breakfast: 6:30-7:30 am ?Lunch served: 10:40 am - 12:40pm ? ? ? ? ?  ? ? ?DAY CENTERS ?[facility] Strategy [facility]) ?M-F 8am-3pm   ?[address] [place], [place] [address]   [phone] ?Services include: laundry, barbering, support groups, case management, phone  & computer access, showers, AA/NA mtgs, mental health/substance abuse nurse, job skills class, disability information, VA assistance, spiritual classes, etc.  ? ?[facility] ?[address] [place], [place]   [phone] ?Provides  breakfast each weekday morning except Wednesdays, and an evening community meal every Friday. Access to showers is available during breakfast hours and telephones for seeking work are also provided. Also offers job referral and counseling for the homeless and unemployed. ? ?HOMELESS SHELTERS ?[employer]   [employer] ?[address]     [facility] ?[place], [place] [address]     [address], [place]  ?[phone]      [phone] ? ?[facility]   [employer] ?[address]    [address] ?[place] [address]     [place], [place] [address] ?[phone]       [phone] ? ?[employer] (women only) ?[address] ?[place], [place] [address] ?[phone] ? ?[facility]: [facility] (overflow shelter: [address] [place], [place] check in at 6pm for placement in local shelter).  ?[address] [place], [place], [place] [address] ?Phone: [phone] ? ?Crisis Services ?[facility]     [facility]   ?Crisis Services      [facility] ?[phone]      [phone] ?[address]     [address] ?[place], [place] [address]     [place], [place] [address] ? ? ? ?[facility] ?[facility] - [phone]  ?

## 2021-11-17 NOTE — ED Provider Notes (Signed)
?[facility] EMERGENCY DEPARTMENT ?Provider Note ? ? ?CSN: [id number] ?Arrival date & time: 11/17/21  0230 ? ?  ? ?History ?Chief Complaint  ?Patient presents with  ? Shortness of Breath  ? ? ?Dale Edwards is a 66 y.o. male with history of COPD presents to the emergency department for evaluation of episodic shortness of breath off-and-on for the past year.  Patient was brought in via EMS from the [employer] center.  He was given Symbicort and albuterol reports he uses them as needed.  He denies any chest pain, nausea, vomiting, abdominal pain, lightheadedness, or fever recently.  He reports a nonproductive mild cough but is chronic.  Denies any rhinorrhea or nasal congestion.  He denies any medication use.  He currently reports that he is homeless and looking for a place to live which is why he was at the [facility] center. ? ? ?Shortness of Breath ?Associated symptoms: cough   ?Associated symptoms: no abdominal pain, no chest pain, no fever and no vomiting   ? ?  ? ?Home Medications ?Prior to Admission medications   ?Medication Sig Start Date End Date Taking? Authorizing Provider  ?acetaminophen (TYLENOL) 325 MG tablet Take 2 tablets (650 mg total) by mouth every 4 (four) hours as needed. 07/26/21 07/26/22  Burnadette Pop, MD  ?albuterol (VENTOLIN HFA) 108 (90 Base) MCG/ACT inhaler Inhale 2 puffs into the lungs every 6 (six) hours as needed for wheezing or shortness of breath. 07/27/21   Regalado, Belkys A, MD  ?folic acid (FOLVITE) 1 MG tablet Take 1 tablet (1 mg total) by mouth daily. 07/27/21   Regalado, Belkys A, MD  ?nicotine (NICODERM CQ - DOSED IN MG/24 HOURS) 21 mg/24hr patch Place 1 patch (21 mg total) onto the skin daily. 07/27/21   Burnadette Pop, MD  ?predniSONE (DELTASONE) 50 MG tablet Take 1 tablet (50 mg total) by mouth daily. 10/11/21   Dione Booze, MD  ?thiamine 100 MG tablet Take 1 tablet (100 mg total) by mouth daily. 07/27/21   Regalado, Prentiss Bells, MD  ?    ? ?Allergies    ?Hydrocodone-acetaminophen   ? ?Review of Systems   ?Review of Systems  ?Constitutional:  Negative for chills and fever.  ?HENT:  Negative for congestion and rhinorrhea.   ?Respiratory:  Positive for cough and shortness of breath. Negative for chest tightness.   ?Cardiovascular:  Negative for chest pain and palpitations.  ?Gastrointestinal:  Negative for abdominal pain, constipation, diarrhea, nausea and vomiting.  ?Neurological:  Negative for light-headedness.  ? ?Physical Exam ?Updated Vital Signs ?BP 121/75 (BP Location: Left Arm)   Pulse (!) 51   Temp 98.3 ?F (36.8 ?C)   Resp 15   Ht 5\' 6"  (1.676 m)   Wt 68 kg   SpO2 98%   BMI 24.21 kg/m?  ?Physical Exam ?Vitals and nursing note reviewed.  ?Constitutional:   ?   General: He is not in acute distress. ?   Appearance: Normal appearance. He is not ill-appearing, toxic-appearing or diaphoretic.  ?HENT:  ?   Head: Normocephalic and atraumatic.  ?   Mouth/Throat:  ?   Mouth: Mucous membranes are moist.  ?Eyes:  ?   General: No scleral icterus. ?Cardiovascular:  ?   Rate and Rhythm: Regular rhythm. Bradycardia present.  ?Pulmonary:  ?   Effort: Pulmonary effort is normal.  ?   Breath sounds: Normal breath sounds. No decreased breath sounds.  ?   Comments: Lungs are clear to auscultation  bilaterally.  No respiratory distress, accessory muscle use, nasal flaring, tripoding, or cyanosis present.  Patient is speaking in full sentences with ease and satting well on room air without any increased work of breathing. ?Chest:  ?   Chest wall: No tenderness.  ?Abdominal:  ?   General: Abdomen is flat. Bowel sounds are normal.  ?   Palpations: Abdomen is soft.  ?Musculoskeletal:     ?   General: No deformity.  ?   Cervical back: Normal range of motion.  ?   Right lower leg: No edema.  ?   Left lower leg: No edema.  ?Skin: ?   General: Skin is warm and dry.  ?Neurological:  ?   General: No focal deficit present.  ?   Mental Status: He is alert. Mental status  is at baseline.  ? ? ?ED Results / Procedures / Treatments   ?Labs ?(all labs ordered are listed, but only abnormal results are displayed) ?Labs Reviewed  ?RESP PANEL BY RT-PCR (FLU A&B, COVID) [id number]  ?CBC WITH DIFFERENTIAL/PLATELET  ?BASIC METABOLIC PANEL  ?TROPONIN I (HIGH SENSITIVITY)  ?TROPONIN I (HIGH SENSITIVITY)  ? ? ?EKG ?None ? ?Radiology ?DG Chest 2 View ? ?Result Date: 11/17/2021 ?CLINICAL DATA:  Cough and shortness of breath EXAM: CHEST - 2 VIEW COMPARISON:  10/10/2021 FINDINGS: Unchanged blunting of the right costophrenic angle with right-greater-than-left interstitial opacity. No new area of consolidation. Cardiomediastinal contours are normal. IMPRESSION: Unchanged appearance of the chest with right-greater-than-left interstitial opacities, likely scarring or atelectasis. Electronically Signed   By: Deatra Robinson M.D.   On: 11/17/2021 03:13   ? ?Procedures ?Procedures  ? ?Medications Ordered in ED ?Medications - No data to display ? ?ED Course/ Medical Decision Making/ A&P ?  ?                        ?Medical Decision Making ? ?66 year old male with history of COPD presents emergency department for evaluation of off-and-on shortness of breath for the past year as well as a mild nonproductive cough.  Differential diagnosis includes was not limited to PE, pneumonia, pneumothorax, ACS, COPD exacerbation.  Vital signs show normotensive, afebrile, bradycardic in the 50s although this is consistent with patient reading previous notes.  He is satting well on room air without any increased work of breathing.  Physical exam is pertinent for lungs are clear to auscultation bilaterally.  No respiratory distress, accessory muscle use, nasal flaring, tripoding, or cyanosis present.  Patient is speaking in full sentences with ease and satting well on room air without any increased work of breathing. ? ?I independently reviewed and interpreted the patient's labs and imaging and agree with radiologist interpretation.   Are overall unremarkable.  Negative for COVID and flu.  BMP shows no electrolyte abnormality.  CBC shows no leukocytosis or anemia.  Ischial troponin 4 with repeat of 4, delta 0.  Chest x-ray shows unchanged appearance of the chest with right greater than left interstitial opacities that likely scarring or atelectasis.  EKG shows sinus rhythm with occasional PVC, similar to previous. ? ?Patient has both of his inhalers with him.  We discussed good inhaler technique.  He took 2 puffs of his Symbicort in the emergency department.  His inhalers are full, he reports he does not need any refills. ? ?Given unchanged EKG, and reassuring flat troponins, doubt ACS.  Less likely COPD exacerbation as patient has been having the symptoms off and on for the past year  and his lung sounds are clear with an unchanged x-ray.  He is satting well on room air.  Doubt any pneumonia or pneumothorax given reassuring chest x-ray as well as physical exam findings.  Doubt any PE given the patient's been having symptoms off and on for the past year and his shortness breath is relieved by his inhalers. ? ?The patient's lungs are surprisingly clear given his history of COPD and noncompliance with his inhalers.  He is in no respiratory distress.  Had both inhalers with him and he reported that he thought both of them were as needed.  I informed the patient that he needs to use the Symbicort daily and to follow-up with his primary care provider.  We discussed return precautions.  I do not think the patient needs antibiotics at this time as he has a unchanged chest x-ray, satting well on room air, and his lung sounds are surprisingly clear.  TOC consult placed for patient's homelessness. The patient verbalizes understanding of the return precautions and agrees to the plan.  Patient stable and being discharged home in good condition. ? ? ?Final Clinical Impression(s) / ED Diagnoses ?Final diagnoses:  ?SOB (shortness of breath)  ? ? ?Rx / DC  Orders ?ED Discharge Orders   ? ? None  ? ?  ? ? ?  ?Achille Rich, PA-C ?11/17/21 4403 ? ?  ?Sloan Leiter, DO ?11/18/21 [phone] ? ?

## 2021-11-23 ENCOUNTER — Other Ambulatory Visit: Payer: Self-pay

## 2021-11-23 ENCOUNTER — Emergency Department (HOSPITAL_COMMUNITY)
Admission: EM | Admit: 2021-11-23 | Discharge: 2021-11-23 | Disposition: A | Discharge status: Home / Self Care | Payer: Medicare Other | Attending: Emergency Medicine | Admitting: Emergency Medicine

## 2021-11-23 ENCOUNTER — Encounter (HOSPITAL_COMMUNITY): Payer: Self-pay

## 2021-11-23 DIAGNOSIS — M79671 Pain in right foot: Secondary | ICD-10-CM | POA: Diagnosis present

## 2021-11-23 DIAGNOSIS — B353 Tinea pedis: Secondary | ICD-10-CM

## 2021-11-23 DIAGNOSIS — M79672 Pain in left foot: Secondary | ICD-10-CM | POA: Insufficient documentation

## 2021-11-23 LAB — COMPREHENSIVE METABOLIC PANEL
ALT: 15 U/L (ref 0–44)
AST: 16 U/L (ref 15–41)
Albumin: 4 g/dL (ref 3.5–5.0)
Alkaline Phosphatase: 65 U/L (ref 38–126)
Anion gap: 7 (ref 5–15)
BUN: 13 mg/dL (ref 8–23)
CO2: 30 mmol/L (ref 22–32)
Calcium: 9.1 mg/dL (ref 8.9–10.3)
Chloride: 103 mmol/L (ref 98–111)
Creatinine, Ser: 0.79 mg/dL (ref 0.61–1.24)
GFR, Estimated: >60 mL/min (ref >60)
Glucose, Bld: 80 mg/dL (ref 70–99)
Potassium: 4.1 mmol/L (ref 3.5–5.1)
Sodium: 140 mmol/L (ref 135–145)
Total Bilirubin: 1.4 mg/dL — ABNORMAL HIGH (ref 0.3–1.2)
Total Protein: 6.7 g/dL (ref 6.5–8.1)

## 2021-11-23 LAB — CBC WITH DIFFERENTIAL/PLATELET
Abs Immature Granulocytes: 0.03 10*3/uL (ref 0.00–0.07)
Basophils Absolute: 0.1 10*3/uL (ref 0.0–0.1)
Basophils Relative: 1 %
Eosinophils Absolute: 0.1 10*3/uL (ref 0.0–0.5)
Eosinophils Relative: 1 %
HCT: 50.7 % (ref 39.0–52.0)
Hemoglobin: 17.1 g/dL — ABNORMAL HIGH (ref 13.0–17.0)
Immature Granulocytes: 0 %
Lymphocytes Relative: 21 %
Lymphs Abs: 1.9 10*3/uL (ref 0.7–4.0)
MCH: 32.9 pg (ref 26.0–34.0)
MCHC: 33.7 g/dL (ref 30.0–36.0)
MCV: 97.5 fL (ref 80.0–100.0)
Monocytes Absolute: 0.6 10*3/uL (ref 0.1–1.0)
Monocytes Relative: 7 %
Neutro Abs: 6.3 10*3/uL (ref 1.7–7.7)
Neutrophils Relative %: 70 %
Platelets: 252 10*3/uL (ref 150–400)
RBC: 5.2 MIL/uL (ref 4.22–5.81)
RDW: 13 % (ref 11.5–15.5)
WBC: 9 10*3/uL (ref 4.0–10.5)
nRBC: 0 % (ref 0.0–0.2)

## 2021-11-23 MED ORDER — CLOTRIMAZOLE 1 % EX CREA
TOPICAL_CREAM | Freq: Two times a day (BID) | CUTANEOUS | Status: DC
  Filled 2021-11-23: qty 15

## 2021-11-23 MED ORDER — OXYCODONE-ACETAMINOPHEN 5-325 MG PO TABS
1.0000 | ORAL_TABLET | Freq: Once | ORAL | Status: AC
  Administered 2021-11-23: 1 via ORAL
  Filled 2021-11-23: qty 1

## 2021-11-23 NOTE — Discharge Instructions (Signed)
You have fungal infection of your feet from wearing wet socks and shoes ? ?Please change your socks frequently ? ?Please use Lotrimin twice daily for a week ? ?See your doctor for follow-up ? ?Return to ER if you have worse leg swelling, calf pain, trouble breathing ?

## 2021-11-23 NOTE — ED Notes (Signed)
Patient provided with socks, food, and bus pass at time of discharge. ?

## 2021-11-23 NOTE — ED Triage Notes (Signed)
Patient BIB [facility]. Homeless. Reporting today he is having more swelling and pain in his lower extremities more than normal. Chronic pain. He said the percocet he took did not help with the pain. Uses a walker.  ?

## 2021-11-23 NOTE — ED Provider Notes (Signed)
?[facility]-EMERGENCY DEPT ?Provider Note ? ? ?CSN: [id number] ?Arrival date & time: 11/23/21  2034 ? ?  ? ?History ? ?Chief Complaint  ?Patient presents with  ? Leg Pain  ? ? ?Dale Edwards is a 66 y.o. male history of chronic leg pain, homelessness, alcohol abuse, here presenting with bilateral leg pain.  Patient states that he is homeless currently.  He states that he has no money has been walking outside and it was raining.  He states that his shoes are all wet and his socks are all wet.  He has bilateral foot pain. Patient denies any trouble breathing or history of blood clots.  He has been prescribed oxycodone and took 1 dose and did not help and came here for further evaluation.  Denies any trauma or injury ? ?The history is provided by the patient.  ? ?  ? ?Home Medications ?Prior to Admission medications   ?Medication Sig Start Date End Date Taking? Authorizing Provider  ?acetaminophen (TYLENOL) 325 MG tablet Take 2 tablets (650 mg total) by mouth every 4 (four) hours as needed. 07/26/21 07/26/22  Burnadette Pop, MD  ?albuterol (VENTOLIN HFA) 108 (90 Base) MCG/ACT inhaler Inhale 2 puffs into the lungs every 6 (six) hours as needed for wheezing or shortness of breath. 07/27/21   Regalado, Belkys A, MD  ?folic acid (FOLVITE) 1 MG tablet Take 1 tablet (1 mg total) by mouth daily. 07/27/21   Regalado, Belkys A, MD  ?nicotine (NICODERM CQ - DOSED IN MG/24 HOURS) 21 mg/24hr patch Place 1 patch (21 mg total) onto the skin daily. 07/27/21   Burnadette Pop, MD  ?predniSONE (DELTASONE) 50 MG tablet Take 1 tablet (50 mg total) by mouth daily. 10/11/21   Dione Booze, MD  ?thiamine 100 MG tablet Take 1 tablet (100 mg total) by mouth daily. 07/27/21   Regalado, Prentiss Bells, MD  ?   ? ?Allergies    ?Hydrocodone-acetaminophen   ? ?Review of Systems   ?Review of Systems  ?Skin:  Positive for color change.  ?All other systems reviewed and are negative. ? ?Physical Exam ?Updated Vital Signs ?BP 118/78 (BP  Location: Left Arm)   Pulse 85   Temp 97.9 ?F (36.6 ?C) (Oral)   Resp 18   Ht 5\' 6"  (1.676 m)   Wt 68 kg   SpO2 96%   BMI 24.21 kg/m?  ?Physical Exam ?Vitals and nursing note reviewed.  ?Constitutional:   ?   Comments: Disheveled  ?HENT:  ?   Head: Normocephalic.  ?   Nose: Nose normal.  ?   Mouth/Throat:  ?   Mouth: Mucous membranes are moist.  ?Eyes:  ?   Extraocular Movements: Extraocular movements intact.  ?   Pupils: Pupils are equal, round, and reactive to light.  ?Cardiovascular:  ?   Rate and Rhythm: Normal rate and regular rhythm.  ?   Pulses: Normal pulses.  ?   Heart sounds: Normal heart sounds.  ?Pulmonary:  ?   Effort: Pulmonary effort is normal.  ?   Breath sounds: Normal breath sounds.  ?Abdominal:  ?   General: Abdomen is flat.  ?   Palpations: Abdomen is soft.  ?Musculoskeletal:  ?   Cervical back: Normal range of motion and neck supple.  ?   Comments: Bilateral feet with some erythema in the toes.  Patient has mild fungal infection bilateral feet.  There is no obvious superimposed cellulitis.   ?Skin: ?   Capillary Refill: Capillary refill takes  less than 2 seconds.  ?Neurological:  ?   General: No focal deficit present.  ?   Mental Status: He is alert and oriented to person, place, and time.  ?Psychiatric:     ?   Mood and Affect: Mood normal.     ?   Behavior: Behavior normal.  ? ? ?ED Results / Procedures / Treatments   ?Labs ?(all labs ordered are listed, but only abnormal results are displayed) ?Labs Reviewed  ?CBC WITH DIFFERENTIAL/PLATELET  ?COMPREHENSIVE METABOLIC PANEL  ? ? ?EKG ?None ? ?Radiology ?No results found. ? ?Procedures ?Procedures  ? ? ?Medications Ordered in ED ?Medications  ?oxyCODONE-acetaminophen (PERCOCET/ROXICET) 5-325 MG per tablet 1 tablet (has no administration in time range)  ? ? ?ED Course/ Medical Decision Making/ A&P ?  ?                        ?Medical Decision Making ?Dale Edwards is a 66 y.o. male here presenting with pain with his feet.  Patient has  mild fungal infection from being on his feet.  No obvious cellulitis. We will check basic lab work.  ? ?10:30 PM ?Labs unremarkable.  Will give Lotrimin for athlete's foot.  Stable for discharge ? ? ?Problems Addressed: ?Tinea pedis of both feet: acute illness or injury ? ?Amount and/or Complexity of Data Reviewed ?Labs: ordered. Decision-making details documented in ED Course. ? ?Risk ?Prescription drug management. ? ? ?Final Clinical Impression(s) / ED Diagnoses ?Final diagnoses:  ?None  ? ? ?Rx / DC Orders ?ED Discharge Orders   ? ? None  ? ?  ? ? ?  ?Charlynne Pander, MD ?11/23/21 2231 ? ?

## 2021-11-25 ENCOUNTER — Emergency Department (HOSPITAL_COMMUNITY)
Admission: EM | Admit: 2021-11-25 | Discharge: 2021-11-26 | Disposition: A | Discharge status: Home / Self Care | Payer: Medicare Other | Attending: Emergency Medicine | Admitting: Emergency Medicine

## 2021-11-25 DIAGNOSIS — Z7951 Long term (current) use of inhaled steroids: Secondary | ICD-10-CM | POA: Diagnosis not present

## 2021-11-25 DIAGNOSIS — R062 Wheezing: Secondary | ICD-10-CM | POA: Insufficient documentation

## 2021-11-25 DIAGNOSIS — R0789 Other chest pain: Secondary | ICD-10-CM | POA: Diagnosis not present

## 2021-11-25 DIAGNOSIS — J449 Chronic obstructive pulmonary disease, unspecified: Secondary | ICD-10-CM | POA: Insufficient documentation

## 2021-11-25 DIAGNOSIS — R790 Abnormal level of blood mineral: Secondary | ICD-10-CM | POA: Insufficient documentation

## 2021-11-25 NOTE — ED Triage Notes (Signed)
Pt BIB [facility] for chest tightness x 2 weeks, c/o bilateral calf and ankle swelling, v/s WNL; pt rcvd 324mg  of aspirin, 0.4 nitro and 100cc NS en route  ?

## 2021-11-26 ENCOUNTER — Other Ambulatory Visit: Payer: Self-pay

## 2021-11-26 ENCOUNTER — Encounter (HOSPITAL_COMMUNITY): Payer: Self-pay | Admitting: Emergency Medicine

## 2021-11-26 ENCOUNTER — Emergency Department (HOSPITAL_COMMUNITY): Payer: Medicare Other

## 2021-11-26 DIAGNOSIS — R0789 Other chest pain: Secondary | ICD-10-CM | POA: Diagnosis not present

## 2021-11-26 LAB — COMPREHENSIVE METABOLIC PANEL
ALT: 16 U/L (ref 0–44)
AST: 18 U/L (ref 15–41)
Albumin: 3.8 g/dL (ref 3.5–5.0)
Alkaline Phosphatase: 62 U/L (ref 38–126)
Anion gap: 11 (ref 5–15)
BUN: 11 mg/dL (ref 8–23)
CO2: 21 mmol/L — ABNORMAL LOW (ref 22–32)
Calcium: 8.8 mg/dL — ABNORMAL LOW (ref 8.9–10.3)
Chloride: 105 mmol/L (ref 98–111)
Creatinine, Ser: 0.75 mg/dL (ref 0.61–1.24)
GFR, Estimated: >60 mL/min (ref >60)
Glucose, Bld: 79 mg/dL (ref 70–99)
Potassium: 3.8 mmol/L (ref 3.5–5.1)
Sodium: 137 mmol/L (ref 135–145)
Total Bilirubin: 0.7 mg/dL (ref 0.3–1.2)
Total Protein: 6.2 g/dL — ABNORMAL LOW (ref 6.5–8.1)

## 2021-11-26 LAB — CBC WITH DIFFERENTIAL/PLATELET
Abs Immature Granulocytes: 0.01 10*3/uL (ref 0.00–0.07)
Basophils Absolute: 0.1 10*3/uL (ref 0.0–0.1)
Basophils Relative: 1 %
Eosinophils Absolute: 0.1 10*3/uL (ref 0.0–0.5)
Eosinophils Relative: 2 %
HCT: 49.6 % (ref 39.0–52.0)
Hemoglobin: 16.4 g/dL (ref 13.0–17.0)
Immature Granulocytes: 0 %
Lymphocytes Relative: 28 %
Lymphs Abs: 2.1 10*3/uL (ref 0.7–4.0)
MCH: 32.1 pg (ref 26.0–34.0)
MCHC: 33.1 g/dL (ref 30.0–36.0)
MCV: 97.1 fL (ref 80.0–100.0)
Monocytes Absolute: 0.5 10*3/uL (ref 0.1–1.0)
Monocytes Relative: 7 %
Neutro Abs: 4.9 10*3/uL (ref 1.7–7.7)
Neutrophils Relative %: 62 %
Platelets: 252 10*3/uL (ref 150–400)
RBC: 5.11 MIL/uL (ref 4.22–5.81)
RDW: 13.1 % (ref 11.5–15.5)
WBC: 7.8 10*3/uL (ref 4.0–10.5)
nRBC: 0 % (ref 0.0–0.2)

## 2021-11-26 LAB — TROPONIN I (HIGH SENSITIVITY)
Troponin I (High Sensitivity): 7 ng/L (ref <18)
Troponin I (High Sensitivity): 6 ng/L (ref <18)

## 2021-11-26 LAB — BRAIN NATRIURETIC PEPTIDE: B Natriuretic Peptide: 156.4 pg/mL — ABNORMAL HIGH (ref 0.0–100.0)

## 2021-11-26 MED ORDER — AEROCHAMBER PLUS FLO-VU LARGE MISC
1.0000 | Freq: Once | Status: AC
  Administered 2021-11-26: 1

## 2021-11-26 MED ORDER — ALBUTEROL SULFATE HFA 108 (90 BASE) MCG/ACT IN AERS
2.0000 | INHALATION_SPRAY | Freq: Once | RESPIRATORY_TRACT | Status: AC
  Administered 2021-11-26: 2 via RESPIRATORY_TRACT
  Filled 2021-11-26: qty 6.7

## 2021-11-26 NOTE — Discharge Instructions (Addendum)
You were seen in the ER today for your chest discomfort. There are no signs of heart attack or emergent problem with your heart or lungs.  ? ?Please follow up with your primary care doctor and the cardiologist listed below. Return to the ER with any new severe symptoms.  ?

## 2021-11-26 NOTE — ED Provider Notes (Signed)
?[facility] EMERGENCY DEPARTMENT ?Provider Note ? ? ?CSN: [id number] ?Arrival date & time: 11/25/21  2355 ?  ?History ? ?Chief Complaint  ?Patient presents with  ? Chest Pain  ? ? ?Dale Edwards is a 66 y.o. male who is homeless and was seen in the emergency department 2 days ago for lower extremity swelling who presents today with concern for intermittent chest discomfort and wheezing for the last 2 weeks.  Also reports previously noted bilateral lower extremity swelling without unilaterality or redness.  No pain. ? ?Presents to the ED via EMS.  I have personally reviewed his medical records.  He additionally has relapsing remitting multiple sclerosis, lumbar radiculopathy, vitamin D deficiency, alcohol and nicotine dependence, and COPD.  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 ? ?HPI ? ?  ? ?Home Medications ?Prior to Admission medications   ?Medication Sig Start Date End Date Taking? Authorizing Provider  ?acetaminophen (TYLENOL) 325 MG tablet Take 2 tablets (650 mg total) by mouth every 4 (four) hours as needed. ?Patient taking differently: Take 650 mg by mouth every 4 (four) hours as needed for mild pain or headache. 07/26/21 07/26/22 Yes Burnadette Pop, MD  ?albuterol (VENTOLIN HFA) 108 (90 Base) MCG/ACT inhaler Inhale 2 puffs into the lungs every 6 (six) hours as needed for wheezing or shortness of breath. 07/27/21  Yes Regalado, Belkys A, MD  ?ondansetron (ZOFRAN) 4 MG tablet Take 4 mg by mouth every 6 (six) hours as needed for nausea or vomiting. 11/13/21  Yes [provider]  ?oxyCODONE-acetaminophen (PERCOCET) 10-325 MG tablet Take 1 tablet by mouth every 6 (six) hours as needed for pain. 11/13/21  Yes [provider]  ?SYMBICORT 160-4.5 MCG/ACT inhaler Inhale 2 puffs into the lungs in the morning and at bedtime. 11/13/21  Yes [provider]  ?folic acid (FOLVITE) 1 MG tablet Take 1 tablet (1 mg total) by mouth daily. ?Patient not taking: Reported on 11/26/2021  07/27/21   Regalado, Jon Billings A, MD  ?nicotine (NICODERM CQ - DOSED IN MG/24 HOURS) 21 mg/24hr patch Place 1 patch (21 mg total) onto the skin daily. ?Patient not taking: Reported on 11/26/2021 07/27/21   Burnadette Pop, MD  ?predniSONE (DELTASONE) 50 MG tablet Take 1 tablet (50 mg total) by mouth daily. ?Patient not taking: Reported on 11/26/2021 10/11/21   Dione Booze, MD  ?thiamine 100 MG tablet Take 1 tablet (100 mg total) by mouth daily. ?Patient not taking: Reported on 11/26/2021 07/27/21   Alba Cory, MD  ?   ? ?Allergies    ?Hydrocodone-acetaminophen   ? ?Review of Systems   ?Review of Systems  ?Constitutional: Negative.   ?HENT: Negative.    ?Respiratory:  Positive for shortness of breath and wheezing.   ?Cardiovascular:  Positive for palpitations and leg swelling.  ?Gastrointestinal: Negative.   ?Genitourinary: Negative.   ?Musculoskeletal: Negative.   ?Neurological: Negative.   ?Hematological: Negative.   ? ?Physical Exam ?Updated Vital Signs ?BP 108/66   Pulse 60   Temp 97.6 ?F (36.4 ?C) (Oral)   Resp 13   Ht  (1.676 m)   Wt 68 kg   SpO2 96%   BMI 24.21 kg/m?  ?Physical Exam ?Vitals and nursing note reviewed.  ?Constitutional:   ?   Appearance: He is not ill-appearing or toxic-appearing.  ?   Comments: Poor hygiene  ?HENT:  ?   Head: Normocephalic and atraumatic.  ?   Nose: Nose normal.  ?   Mouth/Throat:  ?  Mouth: Mucous membranes are moist.  ?   Pharynx: Oropharynx is clear. Uvula midline. No oropharyngeal exudate or posterior oropharyngeal erythema.  ?   Tonsils: No tonsillar exudate.  ?Eyes:  ?   General: Lids are normal. Vision grossly intact.     ?   Right eye: No discharge.     ?   Left eye: No discharge.  ?   Extraocular Movements: Extraocular movements intact.  ?   Conjunctiva/sclera: Conjunctivae normal.  ?   Pupils: Pupils are equal, round, and reactive to light.  ?Neck:  ?   Trachea: Trachea and phonation normal.  ?Cardiovascular:  ?   Rate and Rhythm: Normal rate and regular  rhythm.  ?   Pulses: Normal pulses.  ?   Heart sounds: Normal heart sounds. No murmur heard. ?   Comments: Patient noted to be intermittently be in ventricular trigeminy on the monitor ?Pulmonary:  ?   Effort: Pulmonary effort is normal. No respiratory distress.  ?   Breath sounds: Examination of the right-lower field reveals wheezing. Examination of the left-lower field reveals wheezing. Wheezing present. No rales.  ?Chest:  ?   Chest wall: No mass, lacerations, deformity, swelling, tenderness, crepitus or edema.  ?Abdominal:  ?   General: Bowel sounds are normal. There is no distension.  ?   Palpations: Abdomen is soft.  ?   Tenderness: There is no abdominal tenderness.  ?Musculoskeletal:     ?   General: No deformity.  ?   Cervical back: Normal range of motion and neck supple.  ?Lymphadenopathy:  ?   Cervical: No cervical adenopathy.  ?Skin: ?   General: Skin is warm and dry.  ?   Capillary Refill: Capillary refill takes less than 2 seconds.  ?   Findings: No rash.  ?Neurological:  ?   General: No focal deficit present.  ?   Mental Status: He is alert and oriented to person, place, and time. Mental status is at baseline.  ?   GCS: GCS eye subscore is 4. GCS verbal subscore is 5. GCS motor subscore is 6.  ?   Gait: Gait is intact.  ?Psychiatric:     ?   Mood and Affect: Mood normal.  ? ? ?ED Results / Procedures / Treatments   ?Labs ?(all labs ordered are listed, but only abnormal results are displayed) ?Labs Reviewed  ?COMPREHENSIVE METABOLIC PANEL - Abnormal; Notable for the following components:  ?    Result Value  ? CO2 21 (*)   ? Calcium 8.8 (*)   ? Total Protein 6.2 (*)   ? All other components within normal limits  ?BRAIN NATRIURETIC PEPTIDE - Abnormal; Notable for the following components:  ? B Natriuretic Peptide 156.4 (*)   ? All other components within normal limits  ?CBC WITH DIFFERENTIAL/PLATELET  ?TROPONIN I (HIGH SENSITIVITY)  ?TROPONIN I (HIGH SENSITIVITY)  ? ? ?EKG ?EKG  Interpretation ? ?Date/Time:  Monday Nov 26 2021 00:07:30 EDT ?Ventricular Rate:  81 ?PR Interval:  120 ?QRS Duration: 78 ?QT Interval:  396 ?QTC Calculation: 460 ?R Axis:   70 ?Text Interpretation: Sinus rhythm with occasional Premature ventricular complexes Cannot rule out Anterior infarct , age undetermined Abnormal ECG When compared with ECG of 17-Nov-2021 02:34, No significant change was found Confirmed by Dione Booze ([id number]) on 11/26/2021 1:10:46 AM ? ?Radiology ?DG Chest 2 View ? ?Result Date: 11/26/2021 ?CLINICAL DATA:  Chest pain and shortness of breath. EXAM: CHEST - 2 VIEW COMPARISON:  None. FINDINGS:  The heart size and mediastinal contours are within normal limits. Lungs are hyperinflated. Diffuse, chronic appearing increased interstitial lung markings are seen. Stable, moderate to marked severity areas of scarring and/or atelectasis are seen within the bilateral lung bases and mid lung fields. There is stable right apical pleural. A small right pleural effusion is again seen. No pneumothorax is identified. The visualized skeletal structures are unremarkable. IMPRESSION: 1. Chronic appearing increased interstitial lung markings with stable, bilateral moderate to marked severity areas of scarring and/or atelectasis. 2. Small right pleural effusion. Electronically Signed   By: Aram Candela M.D.   On: 11/26/2021 00:34   ? ?Procedures ?Procedures  ? ? ?Medications Ordered in ED ?Medications  ?albuterol (VENTOLIN HFA) 108 (90 Base) MCG/ACT inhaler 2 puff (has no administration in time range)  ?AeroChamber Plus Flo-Vu Large MISC 1 each (has no administration in time range)  ? ? ?ED Course/ Medical Decision Making/ A&P ?  ?                        ?Medical Decision Making ?66 year old male presents with concern for chest discomfort earlier this evening. ? ?Vital signs are normal and intake.  Cardiopulmonary abdominal exams significant for mild wheezing in the lung bases and intermittent ventricular trigeminy  but this resolved spontaneously.  He is neurovascular intact in all extremities does have 1+ mild pitting edema in the lower extremities bilaterally.   ? ?Amount and/or Complexity of Data Reviewed ?Labs:  ?   Details: CMP unremarkable, CBC

## 2021-11-26 NOTE — ED Provider Triage Note (Signed)
Emergency Medicine Provider Triage Evaluation Note ? ?Dale Edwards , a 66 y.o. male  was evaluated in triage.  Pt complains of chest pain x 1-2 weeks. Described as tightness, waxes/wanes in severity, no alleviating/aggravating factors. Associated dyspnea, cough, and bilateral leg swelling. Patient reports he has not been able to elevate his legs as he typically would which he thinks had lead to the swelling. ? ?Review of Systems  ?Positive: Chest pain, leg swelling, dyspnea, cough ?Negative: Hemoptysis, fever, nausea, vomiting, diaphoresis, syncope ? ?Physical Exam  ?BP 92/63 (BP Location: Right Arm)   Pulse 78   Temp 97.9 ?F (36.6 ?C)   Resp 18   SpO2 99%  ?Gen:   Awake, no distress   ?Resp:  Normal effort  ?MSK:   Moves extremities without difficulty  ?Other:  Symmetric radial pulses. Symmetric bilateral lower leg pitting edema.  ? ?Medical Decision Making  ?Medically screening exam initiated at 12:05 AM.  Appropriate orders placed.  Dale Edwards was informed that the remainder of the evaluation will be completed by another provider, this initial triage assessment does not replace that evaluation, and the importance of remaining in the ED until their evaluation is complete. ? ?Chest pain ?  ?Cherly Anderson, PA-C ?11/26/21 0007 ? ?

## 2021-12-04 ENCOUNTER — Other Ambulatory Visit (HOSPITAL_COMMUNITY): Payer: Self-pay | Admitting: Nurse Practitioner

## 2021-12-04 DIAGNOSIS — I5043 Acute on chronic combined systolic (congestive) and diastolic (congestive) heart failure: Secondary | ICD-10-CM

## 2021-12-27 ENCOUNTER — Encounter (HOSPITAL_COMMUNITY): Payer: Self-pay

## 2021-12-27 ENCOUNTER — Emergency Department (HOSPITAL_COMMUNITY): Payer: Medicare Other

## 2021-12-27 ENCOUNTER — Other Ambulatory Visit: Payer: Self-pay

## 2021-12-27 ENCOUNTER — Emergency Department (HOSPITAL_COMMUNITY)
Admission: EM | Admit: 2021-12-27 | Discharge: 2021-12-27 | Disposition: A | Discharge status: Home / Self Care | Payer: Medicare Other | Attending: Emergency Medicine | Admitting: Emergency Medicine

## 2021-12-27 DIAGNOSIS — S30810A Abrasion of lower back and pelvis, initial encounter: Secondary | ICD-10-CM | POA: Diagnosis not present

## 2021-12-27 DIAGNOSIS — R519 Headache, unspecified: Secondary | ICD-10-CM | POA: Diagnosis not present

## 2021-12-27 DIAGNOSIS — Z7951 Long term (current) use of inhaled steroids: Secondary | ICD-10-CM | POA: Insufficient documentation

## 2021-12-27 DIAGNOSIS — X58XXXA Exposure to other specified factors, initial encounter: Secondary | ICD-10-CM | POA: Insufficient documentation

## 2021-12-27 DIAGNOSIS — J449 Chronic obstructive pulmonary disease, unspecified: Secondary | ICD-10-CM | POA: Insufficient documentation

## 2021-12-27 DIAGNOSIS — S3992XA Unspecified injury of lower back, initial encounter: Secondary | ICD-10-CM | POA: Diagnosis present

## 2021-12-27 MED ORDER — ACETAMINOPHEN 500 MG PO TABS: 1000.0000 mg | ORAL_TABLET | Freq: Four times a day (QID) | ORAL | 0 refills | Status: AC | PRN

## 2021-12-27 MED ORDER — ACETAMINOPHEN 500 MG PO TABS
1000.0000 mg | ORAL_TABLET | Freq: Once | ORAL | Status: AC
  Administered 2021-12-27: 1000 mg via ORAL
  Filled 2021-12-27: qty 2

## 2021-12-27 MED ORDER — CYCLOBENZAPRINE HCL 10 MG PO TABS: 10.0000 mg | ORAL_TABLET | Freq: Two times a day (BID) | ORAL | 0 refills | Status: AC | PRN

## 2021-12-27 NOTE — Discharge Instructions (Signed)
You have been evaluated for your symptoms.  X-ray of your hip and pelvis did not show any broken bones.  You may take Tylenol and muscle relaxant as needed for pain.  Stay hydrated.  Follow-up with your doctor for further care.

## 2021-12-27 NOTE — ED Provider Notes (Signed)
[facility] EMERGENCY DEPARTMENT Provider Note   CSN: [id number] Arrival date & time: 12/27/21  1103     History  Chief Complaint  Patient presents with   Headache    Dale Edwards is a 66 y.o. male.  The history is provided by the patient and medical records. No language interpreter was used.  Headache  66 year old male with history of MS, COPD, homelessness, brought here via EMS with complaints of headache.  Patient complains gradual onset of right-sided frontal temporal headache that happen earlier today.  Pain is described as a achy throbbing sensation without any fever or neck stiffness vision changes productive cough dizziness or chest pain.  He also report feeling hungry and would like something to eat.  Admits to drinking alcohol on a regular basis as well as tobacco use.  He thinks he may have falling and struck his buttock and is having some soreness to his buttock region.  States he may have fallen yesterday.  He denies any leg weakness.  He has right arm tremors which is at baseline.  No nausea vomiting diarrhea or other complication.  No treatment tried.    Home Medications Prior to Admission medications   Medication Sig Start Date End Date Taking? Authorizing Provider  acetaminophen (TYLENOL) 325 MG tablet Take 2 tablets (650 mg total) by mouth every 4 (four) hours as needed. Patient taking differently: Take 650 mg by mouth every 4 (four) hours as needed for mild pain or headache. 07/26/21 07/26/22  Burnadette Pop, MD  albuterol (VENTOLIN HFA) 108 (90 Base) MCG/ACT inhaler Inhale 2 puffs into the lungs every 6 (six) hours as needed for wheezing or shortness of breath. 07/27/21   Regalado, Belkys A, MD  folic acid (FOLVITE) 1 MG tablet Take 1 tablet (1 mg total) by mouth daily. Patient not taking: Reported on 11/26/2021 07/27/21   Regalado, Jon Billings A, MD  nicotine (NICODERM CQ - DOSED IN MG/24 HOURS) 21 mg/24hr patch Place 1 patch (21 mg total) onto the  skin daily. Patient not taking: Reported on 11/26/2021 07/27/21   Burnadette Pop, MD  ondansetron (ZOFRAN) 4 MG tablet Take 4 mg by mouth every 6 (six) hours as needed for nausea or vomiting. 11/13/21   [provider]  oxyCODONE-acetaminophen (PERCOCET) 10-325 MG tablet Take 1 tablet by mouth every 6 (six) hours as needed for pain. 11/13/21   [provider]  predniSONE (DELTASONE) 50 MG tablet Take 1 tablet (50 mg total) by mouth daily. Patient not taking: Reported on 11/26/2021 10/11/21   Dione Booze, MD  [facility] 160-4.5 MCG/ACT inhaler Inhale 2 puffs into the lungs in the morning and at bedtime. 11/13/21   [provider]  thiamine 100 MG tablet Take 1 tablet (100 mg total) by mouth daily. Patient not taking: Reported on 11/26/2021 07/27/21   Alba Cory, MD      Allergies    Hydrocodone-acetaminophen    Review of Systems   Review of Systems  Neurological:  Positive for headaches.  All other systems reviewed and are negative.  Physical Exam Updated Vital Signs BP 117/67 (BP Location: Right Arm)   Pulse 60   Temp 98.5 F (36.9 C) (Oral)   Resp 16   Ht  (1.676 m)   Wt 68 kg   SpO2 98%   BMI 24.21 kg/m  Physical Exam Vitals and nursing note reviewed.  Constitutional:      General: He is not in acute distress.    Appearance: He  is well-developed.  HENT:     Head: Normocephalic and atraumatic.     Comments: No reproducible scalp tenderness Eyes:     Extraocular Movements: Extraocular movements intact.     Conjunctiva/sclera: Conjunctivae normal.     Pupils: Pupils are equal, round, and reactive to light.  Musculoskeletal:        General: Tenderness (Abrasion noticed to the buttocks bilaterally without signs of infection.  Area tender to palpation without any crepitus.  Normal rectal tone) present.     Cervical back: Normal range of motion and neck supple. No rigidity.  Skin:    Findings: No rash.  Neurological:     Mental Status: He is  alert.     GCS: GCS eye subscore is 4. GCS verbal subscore is 5. GCS motor subscore is 6.     Cranial Nerves: No cranial nerve deficit or dysarthria.     Motor: Weakness present.     Comments: Right arm tremors, baseline  Psychiatric:        Mood and Affect: Mood normal.    ED Results / Procedures / Treatments   Labs (all labs ordered are listed, but only abnormal results are displayed) Labs Reviewed - No data to display  EKG None  Radiology DG HIP UNILAT WITH PELVIS 2-3 VIEWS RIGHT  Result Date: 12/27/2021 CLINICAL DATA:  Not available EXAM: DG HIP (WITH OR WITHOUT PELVIS) 2-3V RIGHT COMPARISON:  None Available. FINDINGS: There is no evidence of hip fracture or dislocation. Mild hip joint space narrowing with acetabular lip spurring. IMPRESSION: No evidence of acute fracture or dislocation. Electronically Signed   By: Larose Hires D.O.   On: 12/27/2021 15:42    Procedures Procedures    Medications Ordered in ED Medications  acetaminophen (TYLENOL) tablet 1,000 mg (1,000 mg Oral Given 12/27/21 1320)    ED Course/ Medical Decision Making/ A&P                           Medical Decision Making Amount and/or Complexity of Data Reviewed Radiology: ordered.  Risk OTC drugs.   BP 117/67 (BP Location: Right Arm)   Pulse 60   Temp 98.5 F (36.9 C) (Oral)   Resp 16   Ht 5\' 6"  (1.676 m)   Wt 68 kg   SpO2 98%   BMI 24.21 kg/m   3:02 PM This is a 66 year old male with history of homelessness presenting with multiple complaints.  Firstly complaining of a right-sided headache that happened earlier today and also report that he was hungry.  He does not have any focal neurodeficit on exam and after given food to eat he reported headache did improved.  He also received some Tylenol. No sign of head trauma. Furthermore he complains of pain to his buttock is that he may have fell on his butt.  He does have skin abrasion to his buttocks without any deformity noted.  Will obtain hip and  pelvis x-rays.  Patient with history of tobacco and alcohol use.  Chronic tremors of right hand and history of relapsing remitting multiple sclerosis.  I felt patient symptom is likely acute on chronic.  He is overall well-appearing.  He does not have any nuchal rigidity and fever to suggest meningitis.  No new focal neurodeficit concerning for MS flare, no acute onset thunderclap headache concerning for subarachnoid hemorrhage, low suspicion for acute stroke or space-occupying lesion.  3:42 PM X-ray of the bilateral hip and pelvis was  obtained and independently visualized and interpreted by me and without any acute finding.  Plan to discharge home with symptomatic care.  Outpatient follow-up recommended.  Return precaution discussed.  I also provide patient with support resources as well.         Final Clinical Impression(s) / ED Diagnoses Final diagnoses:  Bad headache  Abrasion of buttock, initial encounter    Rx / DC Orders ED Discharge Orders          Ordered    acetaminophen (TYLENOL) 500 MG tablet  Every 6 hours PRN        12/27/21 1545    cyclobenzaprine (FLEXERIL) 10 MG tablet  2 times daily PRN        12/27/21 1545              Fayrene Helper, PA-C 12/27/21 1548    Horton, Clabe Seal, DO 12/28/21 1020

## 2021-12-27 NOTE — ED Triage Notes (Signed)
Pt [facility] c/o a headache that he rates 2/10. Pt is hungry and requesting something to eat.

## 2022-01-02 ENCOUNTER — Encounter (HOSPITAL_COMMUNITY): Payer: Self-pay

## 2022-01-02 ENCOUNTER — Other Ambulatory Visit: Payer: Self-pay

## 2022-01-02 ENCOUNTER — Emergency Department (HOSPITAL_COMMUNITY): Payer: Medicare Other

## 2022-01-02 ENCOUNTER — Inpatient Hospital Stay (HOSPITAL_COMMUNITY)
Admission: EM | Admit: 2022-01-02 | Discharge: 2022-01-14 | DRG: 026 | Disposition: A | Discharge status: Skilled Nursing Facility | Payer: Medicare Other | Attending: Neurological Surgery | Admitting: Neurological Surgery

## 2022-01-02 DIAGNOSIS — S065XAA Traumatic subdural hemorrhage with loss of consciousness status unknown, initial encounter: Secondary | ICD-10-CM | POA: Diagnosis not present

## 2022-01-02 DIAGNOSIS — L89309 Pressure ulcer of unspecified buttock, unspecified stage: Secondary | ICD-10-CM | POA: Diagnosis not present

## 2022-01-02 DIAGNOSIS — I6200 Nontraumatic subdural hemorrhage, unspecified: Secondary | ICD-10-CM | POA: Diagnosis not present

## 2022-01-02 DIAGNOSIS — S31809D Unspecified open wound of unspecified buttock, subsequent encounter: Secondary | ICD-10-CM

## 2022-01-02 DIAGNOSIS — G9389 Other specified disorders of brain: Secondary | ICD-10-CM | POA: Diagnosis present

## 2022-01-02 DIAGNOSIS — F1020 Alcohol dependence, uncomplicated: Secondary | ICD-10-CM | POA: Diagnosis present

## 2022-01-02 DIAGNOSIS — F1010 Alcohol abuse, uncomplicated: Secondary | ICD-10-CM | POA: Diagnosis not present

## 2022-01-02 DIAGNOSIS — W19XXXA Unspecified fall, initial encounter: Principal | ICD-10-CM

## 2022-01-02 DIAGNOSIS — E440 Moderate protein-calorie malnutrition: Secondary | ICD-10-CM | POA: Insufficient documentation

## 2022-01-02 DIAGNOSIS — Z9181 History of falling: Secondary | ICD-10-CM

## 2022-01-02 DIAGNOSIS — Z79899 Other long term (current) drug therapy: Secondary | ICD-10-CM

## 2022-01-02 DIAGNOSIS — Z7951 Long term (current) use of inhaled steroids: Secondary | ICD-10-CM

## 2022-01-02 DIAGNOSIS — J449 Chronic obstructive pulmonary disease, unspecified: Secondary | ICD-10-CM | POA: Diagnosis present

## 2022-01-02 DIAGNOSIS — G35 Multiple sclerosis: Secondary | ICD-10-CM | POA: Diagnosis present

## 2022-01-02 DIAGNOSIS — R251 Tremor, unspecified: Secondary | ICD-10-CM | POA: Diagnosis present

## 2022-01-02 DIAGNOSIS — Z9889 Other specified postprocedural states: Secondary | ICD-10-CM

## 2022-01-02 DIAGNOSIS — Z885 Allergy status to narcotic agent status: Secondary | ICD-10-CM

## 2022-01-02 DIAGNOSIS — L988 Other specified disorders of the skin and subcutaneous tissue: Secondary | ICD-10-CM | POA: Diagnosis present

## 2022-01-02 DIAGNOSIS — L89329 Pressure ulcer of left buttock, unspecified stage: Secondary | ICD-10-CM | POA: Diagnosis present

## 2022-01-02 DIAGNOSIS — M549 Dorsalgia, unspecified: Secondary | ICD-10-CM | POA: Diagnosis present

## 2022-01-02 DIAGNOSIS — Z6823 Body mass index (BMI) 23.0-23.9, adult: Secondary | ICD-10-CM

## 2022-01-02 DIAGNOSIS — S30810A Abrasion of lower back and pelvis, initial encounter: Secondary | ICD-10-CM | POA: Diagnosis present

## 2022-01-02 DIAGNOSIS — L89319 Pressure ulcer of right buttock, unspecified stage: Secondary | ICD-10-CM | POA: Diagnosis present

## 2022-01-02 DIAGNOSIS — Z5900 Homelessness unspecified: Secondary | ICD-10-CM

## 2022-01-02 DIAGNOSIS — F17210 Nicotine dependence, cigarettes, uncomplicated: Secondary | ICD-10-CM | POA: Diagnosis present

## 2022-01-02 DIAGNOSIS — W010XXA Fall on same level from slipping, tripping and stumbling without subsequent striking against object, initial encounter: Secondary | ICD-10-CM | POA: Diagnosis present

## 2022-01-02 HISTORY — DX: Dyspnea, unspecified: R06.00

## 2022-01-02 HISTORY — DX: Cardiac murmur, unspecified: R01.1

## 2022-01-02 LAB — PROTIME-INR
INR: 1.1 (ref 0.8–1.2)
Prothrombin Time: 13.7 seconds (ref 11.4–15.2)

## 2022-01-02 LAB — APTT: aPTT: 28 seconds (ref 24–36)

## 2022-01-02 MED ORDER — OXYCODONE-ACETAMINOPHEN 5-325 MG PO TABS
1.0000 | ORAL_TABLET | Freq: Once | ORAL | Status: AC
  Administered 2022-01-02: 1 via ORAL
  Filled 2022-01-02: qty 1

## 2022-01-02 MED ORDER — LORAZEPAM 1 MG PO TABS
1.0000 mg | ORAL_TABLET | Freq: Once | ORAL | Status: AC
  Administered 2022-01-02: 1 mg via SUBLINGUAL
  Filled 2022-01-02: qty 1

## 2022-01-02 NOTE — Assessment & Plan Note (Signed)
Pt drinks 1-2 beers per day. CIWA protocol.

## 2022-01-02 NOTE — Consult Note (Signed)
Reason for Consult:SDH Referring Physician: EDP  Jermall Isaacson is an 66 y.o. male.   HPI:  66 year old male presented to the ED tonight after falling on his buttocks at his bank. He states that he fell really hard on it. Has decubitus ulcers on his buttocks. He does report hitting his head in the last month or so. Today when he was in an ED room he was trying to close the door and he fell hitting his head on the sink. He reports some headaches. He is homeless. Denies taking any blood thinners. Currently sitting in bed making a sandwich. Has a history of MS and alcohol use.   Past Medical History:  Diagnosis Date   Back pain    COPD (chronic obstructive pulmonary disease) (HCC)    Multiple sclerosis (HCC)    Tremors of nervous system    Right    Past Surgical History:  Procedure Laterality Date   ADENOIDECTOMY     broken leg     TONSILLECTOMY      Allergies  Allergen Reactions   Hydrocodone-Acetaminophen Itching and Nausea Only         Social History   Tobacco Use   Smoking status: Every Day    Packs/day: 0.50    Types: Cigarettes   Smokeless tobacco: Never   Tobacco comments:    Half Pack  Substance Use Topics   Alcohol use: Yes    Alcohol/week: 1.0 standard drink    Types: 1 Standard drinks or equivalent per week    Comment: quit 2 years ago..   pt is now drinking on occ - very rare maybe once a month.    Family History  Problem Relation Age of Onset   Heart Problems Mother      Review of Systems  Positive ROS: as above  All other systems have been reviewed and were otherwise negative with the exception of those mentioned in the HPI and as above.  Objective: Vital signs in last 24 hours: Temp:  [98 F (36.7 C)-99 F (37.2 C)] 99 F (37.2 C) (06/07 1853) Pulse Rate:  [53-86] 86 (06/07 1853) Resp:  [16-18] 18 (06/07 1853) BP: (106-120)/(59-71) 109/59 (06/07 1853) SpO2:  [95 %-100 %] 95 % (06/07 1853)  General Appearance: disheveled, Alert,  cooperative, no distress, appears stated age Head: Normocephalic, without obvious abnormality, atraumatic Eyes: PERRL, conjunctiva/corneas clear, EOM's intact, fundi benign, both eyes      Lungs: respirations unlabored Heart: Regular rate and rhythm Extremities: Extremities normal, atraumatic, no cyanosis or edema Pulses: 2+ and symmetric all extremities Skin: Skin color, texture, turgor normal, no rashes or lesions  NEUROLOGIC:   Mental status: A&O x4, no aphasia, good attention span, Memory and fund of knowledge Motor Exam - grossly normal, normal tone and bulk Sensory Exam - grossly normal Reflexes: symmetric, no pathologic reflexes, No Hoffman's, No clonus Coordination - grossly normal Gait - not tested Balance -not tested Cranial Nerves: I: smell Not tested  II: visual acuity  OS: na    OD: na  II: visual fields Full to confrontation  II: pupils Equal, round, reactive to light  III,VII: ptosis None  III,IV,VI: extraocular muscles  Full ROM  V: mastication Normal  V: facial light touch sensation  Normal  V,VII: corneal reflex  Present  VII: facial muscle function - upper  Normal  VII: facial muscle function - lower Normal  VIII: hearing Not tested  IX: soft palate elevation  Normal  IX,X: gag reflex Present  XI: trapezius strength  5/5  XI: sternocleidomastoid strength 5/5  XI: neck flexion strength  5/5  XII: tongue strength  Normal    Data Review Lab Results  Component Value Date   WBC 7.8 11/26/2021   HGB 16.4 11/26/2021   HCT 49.6 11/26/2021   MCV 97.1 11/26/2021   PLT 252 11/26/2021   Lab Results  Component Value Date   NA 137 11/26/2021   K 3.8 11/26/2021   CL 105 11/26/2021   CO2 21 (L) 11/26/2021   BUN 11 11/26/2021   CREATININE 0.75 11/26/2021   GLUCOSE 79 11/26/2021   Lab Results  Component Value Date   INR 1.1 01/02/2022    Radiology: CT Head Wo Contrast  Result Date: 01/02/2022 CLINICAL DATA:  Head trauma, moderate-severe EXAM: CT HEAD  WITHOUT CONTRAST TECHNIQUE: Contiguous axial images were obtained from the base of the skull through the vertex without intravenous contrast. RADIATION DOSE REDUCTION: This exam was performed according to the departmental dose-optimization program which includes automated exposure control, adjustment of the mA and/or kV according to patient size and/or use of iterative reconstruction technique. COMPARISON:  None Available. FINDINGS: Brain: Mildly hypodense right cerebral convexity subdural hematoma is present measuring up to 2.8 cm in thickness. Much thinner subdural hematoma is present on the left measuring up to 5 mm in thickness. There is leftward midline shift measuring 8 mm at the level of the septum pellucidum. Prominence of the ventricles and sulci reflects parenchymal volume loss. Patchy and confluent areas of low-density in the supratentorial white matter nonspecific but probably reflect stable chronic microvascular ischemic changes. There is no hydrocephalus. Vascular: There is atherosclerotic calcification at the skull base. Skull: Calvarium is unremarkable. Sinuses/Orbits: No acute finding. Other: Patchy left greater than right mastoid opacification. IMPRESSION: Right much larger than left cerebral convexity mildly hypodense subdural hematomas. There is mass effect on the underlying parenchyma with leftward midline shift measuring 8 mm. Absence of hyperdense hemorrhage suggests this is more subacute or alternatively, this could be acute if there is an underlying coagulopathy. Electronically Signed   By: Guadlupe Spanish M.D.   On: 01/02/2022 19:50   DG Hip Unilat W or Wo Pelvis 2-3 Views Left  Result Date: 01/02/2022 CLINICAL DATA:  Fall with bilateral hip pain. EXAM: DG HIP (WITH OR WITHOUT PELVIS) 2-3V LEFT COMPARISON:  Left hip x-ray 12/27/2021 FINDINGS: There is no evidence of hip fracture or dislocation. There are mild degenerative changes of both hips, similar to the prior study. Soft tissues are  within normal limits. IMPRESSION: No acute osseous abnormality. Electronically Signed   By: Darliss Cheney M.D.   On: 01/02/2022 15:53   DG Hip Unilat W or Wo Pelvis 2-3 Views Right  Result Date: 01/02/2022 CLINICAL DATA:  RIGHT hip pain post fall EXAM: DG HIP (WITH OR WITHOUT PELVIS) 2-3V RIGHT COMPARISON:  12/27/2021 FINDINGS: Osseous mineralization low normal. Mild narrowing of RIGHT hip joint with mild spur formation at the RIGHT femoral head. SI joints and LEFT hip joint space preserved. No fracture, dislocation, or bone destruction. IMPRESSION: Mild degenerative changes RIGHT hip joint without acute osseous abnormalities. Electronically Signed   By: Ulyses Southward M.D.   On: 01/02/2022 15:54     Assessment/Plan: 66 year old male presented to the ED today after falling on his buttocks at a bank but then fell and hit his head here in the ED on a sink. CT head shows a large right holohemispheric chronic SDH and a small left SDH with  leftward midline shift measuring 72mm. He is neurologically at baseline. [facility] up before surgical intervention. He will likely need a right sided crani for evacuation of SDH. We will plan for this either Sunday or Monday. Admit to progressive floor with neuro checks every 2 hours. No blood thinners please.    [facility] 01/02/2022 10:54 PM

## 2022-01-02 NOTE — Discharge Instructions (Signed)
As we discussed, your work-up in the ER today was reassuring for acute abnormalities.  We have given you several pads and barrier cream to help with the wounds on your buttocks.  Please use these to help facilitate wound care.  Return if development of any new or worsening symptoms.

## 2022-01-02 NOTE — Assessment & Plan Note (Signed)
Present on admission.  Unstageable.  Will consult wound care.

## 2022-01-02 NOTE — Assessment & Plan Note (Signed)
Chronic Stable 

## 2022-01-02 NOTE — ED Notes (Signed)
Pt changed into gown.

## 2022-01-02 NOTE — Assessment & Plan Note (Signed)
Chronic. 
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
## 2022-01-02 NOTE — ED Notes (Signed)
[facility] called for pt transport to [facility] 

## 2022-01-02 NOTE — ED Notes (Addendum)
Heard patient fall. Patient laying on floor when this RN entered room. Pt reports he tried to stand up on his own and fell hitting his head on the sink.   Pt c/o headache   Sarah, PA made aware.

## 2022-01-02 NOTE — ED Notes (Signed)
Wound care applied to patients buttock. Barrier cream applied and sacrum pad applied.

## 2022-01-02 NOTE — Assessment & Plan Note (Signed)
Chronically homeless.

## 2022-01-02 NOTE — ED Triage Notes (Signed)
Pt BIB EMS from a bank. Pt fell at bank and now endorses butt pain. Pt has sores on his bottom prior to fall.

## 2022-01-02 NOTE — Subjective & Objective (Signed)
CC: fall HPI: 66 year old white male chronically homeless, history of multiple sclerosis, frequently in the ER for various complaints usually due to hunger, chronic alcohol and tobacco abuse presents to the ER today with complaints of fall and back pain.  Patient reportedly transferred from a bank.  He was seen about 1 week ago in the ER for complaints of a headache.  He also had reported a fall then but could not be more specific.  During his evaluation in ER, his labs are stable.  He is about to be discharged.  He fell and struck his head against the sink or the desk in the room.  This was unwitnessed fall.  CT scan showed a new subdural hematoma with midline shift to the left compared to his prior CT scan in March 2023.  EDP discussed the case with neurosurgery wanted patient evaluated at [facility].  Patient is chronically in a wheelchair.  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  Patient was in the hallway in the ER making examination of his buttocks difficult.  [facility] hospitalist contacted for admission.

## 2022-01-02 NOTE — Assessment & Plan Note (Addendum)
Observation medical bed to [facility] Neurosurgery to consult on patient. Hold all chemical DVT prophylaxis. Checking PT/INT, PTT. Subdural hematoma (HCC) is a Acute illness/condition that poses a threat to life or bodily function.

## 2022-01-02 NOTE — ED Notes (Signed)
Pt awaiting post fall evaluation by provider.

## 2022-01-02 NOTE — H&P (Signed)
History and Physical    Dale Edwards LTJ:[id number] DOB: March 02, 1956 DOA: 01/02/2022  DOS: the patient was seen and examined on 01/02/2022  PCP: Pcp, No   Patient coming from:  homeless. EMS called by bank  I have personally briefly reviewed patient's old medical records in [place] [facility]  CC: fall HPI: 66 year old white male chronically homeless, history of multiple sclerosis, frequently in the ER for various complaints usually due to hunger, chronic alcohol and tobacco abuse presents to the ER today with complaints of fall and back pain.  Patient reportedly transferred from a bank.  He was seen about 1 week ago in the ER for complaints of a headache.  He also had reported a fall then but could not be more specific.  During his evaluation in ER, his labs are stable.  He is about to be discharged.  He fell and struck his head against the sink or the desk in the room.  This was unwitnessed fall.  CT scan showed a new subdural hematoma with midline shift to the left compared to his prior CT scan in March 2023.  EDP discussed the case with neurosurgery wanted patient evaluated at [facility].  Patient is chronically in a wheelchair.  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  Patient was in the hallway in the ER making examination of his buttocks difficult.  [facility] hospitalist contacted for admission.   ED Course: Larey Seat while awaiting discharge in the ED.  Struck his head either on the sink or desk.  CT scan shows subdural hematoma.  Review of Systems:  Review of Systems  Constitutional: Negative.   HENT: Negative.    Eyes: Negative.   Respiratory: Negative.    Cardiovascular: Negative.   Gastrointestinal: Negative.   Genitourinary: Negative.   Musculoskeletal: Negative.   Skin:        Chronic ulcers on his buttocks.  Neurological:  Positive for tremors and headaches.  Endo/Heme/Allergies: Negative.   Psychiatric/Behavioral:  Positive for substance abuse.   All other  systems reviewed and are negative.  Past Medical History:  Diagnosis Date   Back pain    COPD (chronic obstructive pulmonary disease) (HCC)    Multiple sclerosis (HCC)    Tremors of nervous system    Right    Past Surgical History:  Procedure Laterality Date   ADENOIDECTOMY     broken leg     TONSILLECTOMY       reports that he has been smoking cigarettes. He has been smoking an average of .5 packs per day. He has never used smokeless tobacco. He reports current alcohol use of about 1.0 standard drink per week. He reports that he does not use drugs.  Allergies  Allergen Reactions   Hydrocodone-Acetaminophen Itching and Nausea Only         Family History  Problem Relation Age of Onset   Heart Problems Mother     Prior to Admission medications   Medication Sig Start Date End Date Taking? Authorizing Provider  acetaminophen (TYLENOL) 500 MG tablet Take 2 tablets (1,000 mg total) by mouth every 6 (six) hours as needed for mild pain or moderate pain. 12/27/21   Fayrene Helper, PA-C  albuterol (VENTOLIN HFA) 108 (90 Base) MCG/ACT inhaler Inhale 2 puffs into the lungs every 6 (six) hours as needed for wheezing or shortness of breath. 07/27/21   Regalado, Belkys A, MD  cyclobenzaprine (FLEXERIL) 10 MG tablet Take 1 tablet (10 mg total) by mouth 2 (two) times daily  as needed for muscle spasms. 12/27/21   Fayrene Helper, PA-C  folic acid (FOLVITE) 1 MG tablet Take 1 tablet (1 mg total) by mouth daily. Patient not taking: Reported on 11/26/2021 07/27/21   Regalado, Jon Billings A, MD  nicotine (NICODERM CQ - DOSED IN MG/24 HOURS) 21 mg/24hr patch Place 1 patch (21 mg total) onto the skin daily. Patient not taking: Reported on 11/26/2021 07/27/21   Burnadette Pop, MD  ondansetron (ZOFRAN) 4 MG tablet Take 4 mg by mouth every 6 (six) hours as needed for nausea or vomiting. 11/13/21   [provider]  oxyCODONE-acetaminophen (PERCOCET) 10-325 MG tablet Take 1 tablet by mouth every 6 (six) hours as  needed for pain. 11/13/21   [provider]  predniSONE (DELTASONE) 50 MG tablet Take 1 tablet (50 mg total) by mouth daily. Patient not taking: Reported on 11/26/2021 10/11/21   Dione Booze, MD  [facility] 160-4.5 MCG/ACT inhaler Inhale 2 puffs into the lungs in the morning and at bedtime. 11/13/21   [provider]  thiamine 100 MG tablet Take 1 tablet (100 mg total) by mouth daily. Patient not taking: Reported on 11/26/2021 07/27/21   Alba Cory, MD    Physical Exam: Vitals:   01/02/22 1500 01/02/22 1515 01/02/22 1603 01/02/22 1853  BP: 108/65  106/71 (!) 109/59  Pulse: (!) 53 (!) 53 80 86  Resp: 18 16 18 18   Temp:    99 F (37.2 C)  TempSrc:    Oral  SpO2: 96% 97% 100% 95%    Physical Exam Vitals and nursing note reviewed.  Constitutional:      Comments: Chronically ill-appearing.  HENT:     Head: Normocephalic.     Comments: Small bruise/hematoma posterior occiput on the left    Nose: Nose normal. No rhinorrhea.  Eyes:     General: No scleral icterus. Cardiovascular:     Rate and Rhythm: Normal rate.  Pulmonary:     Effort: Pulmonary effort is normal. No respiratory distress.     Breath sounds: Normal breath sounds. No wheezing or rales.  Abdominal:     General: Bowel sounds are normal. There is no distension.     Tenderness: There is no abdominal tenderness. There is no guarding.  Musculoskeletal:     Right lower leg: No edema.     Left lower leg: No edema.  Skin:    General: Skin is warm and dry.     Capillary Refill: Capillary refill takes less than 2 seconds.  Neurological:     Mental Status: He is alert and oriented to person, place, and time.     Comments: Right hand tremor     Labs on Admission: I have personally reviewed following labs and imaging studies  CBC: No results for input(s): WBC, NEUTROABS, HGB, HCT, MCV, PLT in the last 168 hours. Basic Metabolic Panel: No results for input(s): NA, K, CL, CO2, GLUCOSE, BUN, CREATININE,  CALCIUM, MG, PHOS in the last 168 hours. GFR: CrCl cannot be calculated (Patient's most recent lab result is older than the maximum 21 days allowed.). Liver Function Tests: No results for input(s): AST, ALT, ALKPHOS, BILITOT, PROT, ALBUMIN in the last 168 hours. No results for input(s): LIPASE, AMYLASE in the last 168 hours. No results for input(s): AMMONIA in the last 168 hours. Coagulation Profile: No results for input(s): INR, PROTIME in the last 168 hours. Cardiac Enzymes: No results for input(s): CKTOTAL, CKMB, CKMBINDEX, TROPONINI, TROPONINIHS in the last 168 hours. BNP (last  3 results) No results for input(s): PROBNP in the last 8760 hours. HbA1C: No results for input(s): HGBA1C in the last 72 hours. CBG: No results for input(s): GLUCAP in the last 168 hours. Lipid Profile: No results for input(s): CHOL, HDL, LDLCALC, TRIG, CHOLHDL, LDLDIRECT in the last 72 hours. Thyroid Function Tests: No results for input(s): TSH, T4TOTAL, FREET4, T3FREE, THYROIDAB in the last 72 hours. Anemia Panel: No results for input(s): VITAMINB12, FOLATE, FERRITIN, TIBC, IRON, RETICCTPCT in the last 72 hours. Urine analysis:    Component Value Date/Time   COLORURINE YELLOW 07/12/2021 1648   APPEARANCEUR CLEAR 07/12/2021 1648   LABSPEC 1.020 07/12/2021 1648   PHURINE 5.0 07/12/2021 1648   GLUCOSEU NEGATIVE 07/12/2021 1648   HGBUR TRACE (A) 07/12/2021 1648   BILIRUBINUR NEGATIVE 07/12/2021 1648   KETONESUR >80 (A) 07/12/2021 1648   PROTEINUR NEGATIVE 07/12/2021 1648   UROBILINOGEN 0.2 12/17/2011 1743   NITRITE NEGATIVE 07/12/2021 1648   LEUKOCYTESUR NEGATIVE 07/12/2021 1648    Radiological Exams on Admission: I have personally reviewed images CT Head Wo Contrast  Result Date: 01/02/2022 CLINICAL DATA:  Head trauma, moderate-severe EXAM: CT HEAD WITHOUT CONTRAST TECHNIQUE: Contiguous axial images were obtained from the base of the skull through the vertex without intravenous contrast. RADIATION  DOSE REDUCTION: This exam was performed according to the departmental dose-optimization program which includes automated exposure control, adjustment of the mA and/or kV according to patient size and/or use of iterative reconstruction technique. COMPARISON:  None Available. FINDINGS: Brain: Mildly hypodense right cerebral convexity subdural hematoma is present measuring up to 2.8 cm in thickness. Much thinner subdural hematoma is present on the left measuring up to 5 mm in thickness. There is leftward midline shift measuring 8 mm at the level of the septum pellucidum. Prominence of the ventricles and sulci reflects parenchymal volume loss. Patchy and confluent areas of low-density in the supratentorial white matter nonspecific but probably reflect stable chronic microvascular ischemic changes. There is no hydrocephalus. Vascular: There is atherosclerotic calcification at the skull base. Skull: Calvarium is unremarkable. Sinuses/Orbits: No acute finding. Other: Patchy left greater than right mastoid opacification. IMPRESSION: Right much larger than left cerebral convexity mildly hypodense subdural hematomas. There is mass effect on the underlying parenchyma with leftward midline shift measuring 8 mm. Absence of hyperdense hemorrhage suggests this is more subacute or alternatively, this could be acute if there is an underlying coagulopathy. Electronically Signed   By: Guadlupe Spanish M.D.   On: 01/02/2022 19:50   DG Hip Unilat W or Wo Pelvis 2-3 Views Left  Result Date: 01/02/2022 CLINICAL DATA:  Fall with bilateral hip pain. EXAM: DG HIP (WITH OR WITHOUT PELVIS) 2-3V LEFT COMPARISON:  Left hip x-ray 12/27/2021 FINDINGS: There is no evidence of hip fracture or dislocation. There are mild degenerative changes of both hips, similar to the prior study. Soft tissues are within normal limits. IMPRESSION: No acute osseous abnormality. Electronically Signed   By: Darliss Cheney M.D.   On: 01/02/2022 15:53   DG Hip Unilat  W or Wo Pelvis 2-3 Views Right  Result Date: 01/02/2022 CLINICAL DATA:  RIGHT hip pain post fall EXAM: DG HIP (WITH OR WITHOUT PELVIS) 2-3V RIGHT COMPARISON:  12/27/2021 FINDINGS: Osseous mineralization low normal. Mild narrowing of RIGHT hip joint with mild spur formation at the RIGHT femoral head. SI joints and LEFT hip joint space preserved. No fracture, dislocation, or bone destruction. IMPRESSION: Mild degenerative changes RIGHT hip joint without acute osseous abnormalities. Electronically Signed   By: Loraine Leriche  Tyron Russell M.D.   On: 01/02/2022 15:54    EKG: My personal interpretation of EKG shows: NSR, RBBB     Assessment/Plan Principal Problem:   Subdural hematoma (HCC) Active Problems:   Multiple sclerosis (HCC)   Tremor of right hand   Alcohol abuse   Homeless   COPD (chronic obstructive pulmonary disease) (HCC)   Decubitus ulcer of buttock    Assessment and Plan: * Subdural hematoma (HCC) Observation medical bed to [facility]. Neurosurgery to consult on patient. Hold all chemical DVT prophylaxis. Checking PT/INT, PTT. Subdural hematoma (HCC) is a Acute illness/condition that poses a threat to life or bodily function.   COPD (chronic obstructive pulmonary disease) (HCC) Chronic. Stable.  Homeless Chronically homeless.  Alcohol abuse Pt drinks 1-2 beers per day. CIWA protocol.  Tremor of right hand Chronic.  Multiple sclerosis (HCC) Chronic.  Decubitus ulcer of buttock Present on admission.  Unstageable.  Will consult wound care.   DVT prophylaxis: SCDs Code Status: Full Code Family Communication: no family at bedside  Disposition Plan: return to homeless shelter  Consults called: EDP has consulted neurosurgery PA Meyran Admission status: Observation, Telemetry bed   Carollee Herter, DO [facility] 01/02/2022, 9:33 PM

## 2022-01-03 DIAGNOSIS — Z9181 History of falling: Secondary | ICD-10-CM | POA: Diagnosis not present

## 2022-01-03 DIAGNOSIS — L89329 Pressure ulcer of left buttock, unspecified stage: Secondary | ICD-10-CM | POA: Diagnosis present

## 2022-01-03 DIAGNOSIS — Z5900 Homelessness unspecified: Secondary | ICD-10-CM | POA: Diagnosis not present

## 2022-01-03 DIAGNOSIS — Z885 Allergy status to narcotic agent status: Secondary | ICD-10-CM | POA: Diagnosis not present

## 2022-01-03 DIAGNOSIS — J449 Chronic obstructive pulmonary disease, unspecified: Secondary | ICD-10-CM | POA: Diagnosis present

## 2022-01-03 DIAGNOSIS — Z79899 Other long term (current) drug therapy: Secondary | ICD-10-CM | POA: Diagnosis not present

## 2022-01-03 DIAGNOSIS — F17210 Nicotine dependence, cigarettes, uncomplicated: Secondary | ICD-10-CM | POA: Diagnosis present

## 2022-01-03 DIAGNOSIS — I6200 Nontraumatic subdural hemorrhage, unspecified: Secondary | ICD-10-CM | POA: Diagnosis present

## 2022-01-03 DIAGNOSIS — Z6823 Body mass index (BMI) 23.0-23.9, adult: Secondary | ICD-10-CM | POA: Diagnosis not present

## 2022-01-03 DIAGNOSIS — G9389 Other specified disorders of brain: Secondary | ICD-10-CM | POA: Diagnosis present

## 2022-01-03 DIAGNOSIS — S30810A Abrasion of lower back and pelvis, initial encounter: Secondary | ICD-10-CM | POA: Diagnosis present

## 2022-01-03 DIAGNOSIS — Z7951 Long term (current) use of inhaled steroids: Secondary | ICD-10-CM | POA: Diagnosis not present

## 2022-01-03 DIAGNOSIS — S065XAA Traumatic subdural hemorrhage with loss of consciousness status unknown, initial encounter: Secondary | ICD-10-CM | POA: Diagnosis not present

## 2022-01-03 DIAGNOSIS — L988 Other specified disorders of the skin and subcutaneous tissue: Secondary | ICD-10-CM | POA: Diagnosis present

## 2022-01-03 DIAGNOSIS — M549 Dorsalgia, unspecified: Secondary | ICD-10-CM | POA: Diagnosis present

## 2022-01-03 DIAGNOSIS — F1020 Alcohol dependence, uncomplicated: Secondary | ICD-10-CM | POA: Diagnosis present

## 2022-01-03 DIAGNOSIS — E440 Moderate protein-calorie malnutrition: Secondary | ICD-10-CM | POA: Diagnosis present

## 2022-01-03 DIAGNOSIS — L89319 Pressure ulcer of right buttock, unspecified stage: Secondary | ICD-10-CM | POA: Diagnosis present

## 2022-01-03 DIAGNOSIS — W010XXA Fall on same level from slipping, tripping and stumbling without subsequent striking against object, initial encounter: Secondary | ICD-10-CM | POA: Diagnosis present

## 2022-01-03 DIAGNOSIS — G35 Multiple sclerosis: Secondary | ICD-10-CM | POA: Diagnosis present

## 2022-01-03 LAB — COMPREHENSIVE METABOLIC PANEL
ALT: 20 U/L (ref 0–44)
AST: 16 U/L (ref 15–41)
Albumin: 2.7 g/dL — ABNORMAL LOW (ref 3.5–5.0)
Alkaline Phosphatase: 53 U/L (ref 38–126)
Anion gap: 7 (ref 5–15)
BUN: 15 mg/dL (ref 8–23)
CO2: 24 mmol/L (ref 22–32)
Calcium: 8.3 mg/dL — ABNORMAL LOW (ref 8.9–10.3)
Chloride: 105 mmol/L (ref 98–111)
Creatinine, Ser: 0.79 mg/dL (ref 0.61–1.24)
GFR, Estimated: >60 mL/min (ref >60)
Glucose, Bld: 127 mg/dL — ABNORMAL HIGH (ref 70–99)
Potassium: 3.5 mmol/L (ref 3.5–5.1)
Sodium: 136 mmol/L (ref 135–145)
Total Bilirubin: 0.7 mg/dL (ref 0.3–1.2)
Total Protein: 5.2 g/dL — ABNORMAL LOW (ref 6.5–8.1)

## 2022-01-03 LAB — CBC WITH DIFFERENTIAL/PLATELET
Abs Immature Granulocytes: 0.03 10*3/uL (ref 0.00–0.07)
Basophils Absolute: 0.1 10*3/uL (ref 0.0–0.1)
Basophils Relative: 2 %
Eosinophils Absolute: 0.3 10*3/uL (ref 0.0–0.5)
Eosinophils Relative: 4 %
HCT: 44.2 % (ref 39.0–52.0)
Hemoglobin: 15.2 g/dL (ref 13.0–17.0)
Immature Granulocytes: 1 %
Lymphocytes Relative: 26 %
Lymphs Abs: 1.7 10*3/uL (ref 0.7–4.0)
MCH: 33.2 pg (ref 26.0–34.0)
MCHC: 34.4 g/dL (ref 30.0–36.0)
MCV: 96.5 fL (ref 80.0–100.0)
Monocytes Absolute: 0.6 10*3/uL (ref 0.1–1.0)
Monocytes Relative: 9 %
Neutro Abs: 3.7 10*3/uL (ref 1.7–7.7)
Neutrophils Relative %: 58 %
Platelets: 285 10*3/uL (ref 150–400)
RBC: 4.58 MIL/uL (ref 4.22–5.81)
RDW: 13.4 % (ref 11.5–15.5)
WBC: 6.3 10*3/uL (ref 4.0–10.5)
nRBC: 0 % (ref 0.0–0.2)

## 2022-01-03 LAB — VITAMIN B12: Vitamin B-12: 173 pg/mL — ABNORMAL LOW (ref 180–914)

## 2022-01-03 LAB — C-REACTIVE PROTEIN: CRP: 1 mg/dL — ABNORMAL HIGH (ref <1.0)

## 2022-01-03 LAB — RETICULOCYTES
Immature Retic Fract: 5.7 % (ref 2.3–15.9)
RBC.: 4.66 MIL/uL (ref 4.22–5.81)
Retic Count, Absolute: 60.1 10*3/uL (ref 19.0–186.0)
Retic Ct Pct: 1.3 % (ref 0.4–3.1)

## 2022-01-03 LAB — IRON AND TIBC
Iron: 62 ug/dL (ref 45–182)
Saturation Ratios: 22 % (ref 17.9–39.5)
TIBC: 286 ug/dL (ref 250–450)
UIBC: 224 ug/dL

## 2022-01-03 LAB — VITAMIN D 25 HYDROXY (VIT D DEFICIENCY, FRACTURES): Vit D, 25-Hydroxy: 13.1 ng/mL — ABNORMAL LOW (ref 30–100)

## 2022-01-03 LAB — FOLATE: Folate: 16.7 ng/mL (ref >5.9)

## 2022-01-03 LAB — MAGNESIUM: Magnesium: 1.9 mg/dL (ref 1.7–2.4)

## 2022-01-03 LAB — FERRITIN: Ferritin: 372 ng/mL — ABNORMAL HIGH (ref 24–336)

## 2022-01-03 MED ORDER — THIAMINE HCL 100 MG PO TABS
100.0000 mg | ORAL_TABLET | Freq: Every day | ORAL | Status: DC
  Administered 2022-01-03 – 2022-01-14 (×12): 100 mg via ORAL
  Filled 2022-01-03 (×12): qty 1

## 2022-01-03 MED ORDER — GERHARDT'S BUTT CREAM
TOPICAL_CREAM | Freq: Two times a day (BID) | CUTANEOUS | Status: DC
  Administered 2022-01-03 – 2022-01-11 (×3): 1 via TOPICAL
  Filled 2022-01-03 (×3): qty 1

## 2022-01-03 MED ORDER — ACETAMINOPHEN 650 MG RE SUPP: 650.0000 mg | Freq: Four times a day (QID) | RECTAL | Status: DC | PRN

## 2022-01-03 MED ORDER — ACETAMINOPHEN 325 MG PO TABS
650.0000 mg | ORAL_TABLET | Freq: Four times a day (QID) | ORAL | Status: DC | PRN
  Administered 2022-01-05: 650 mg via ORAL
  Filled 2022-01-03: qty 2

## 2022-01-03 MED ORDER — ALBUTEROL SULFATE (2.5 MG/3ML) 0.083% IN NEBU: 2.5000 mg | INHALATION_SOLUTION | RESPIRATORY_TRACT | Status: DC | PRN

## 2022-01-03 MED ORDER — FOLIC ACID 1 MG PO TABS
1.0000 mg | ORAL_TABLET | Freq: Every day | ORAL | Status: DC
  Administered 2022-01-03 – 2022-01-14 (×12): 1 mg via ORAL
  Filled 2022-01-03 (×12): qty 1

## 2022-01-03 MED ORDER — LORAZEPAM 2 MG/ML IJ SOLN: 1.0000 mg | INTRAMUSCULAR | Status: DC | PRN

## 2022-01-03 MED ORDER — ENSURE ENLIVE PO LIQD
237.0000 mL | Freq: Two times a day (BID) | ORAL | Status: DC
  Administered 2022-01-03 – 2022-01-14 (×19): 237 mL via ORAL

## 2022-01-03 MED ORDER — LORAZEPAM 1 MG PO TABS: 1.0000 mg | ORAL_TABLET | ORAL | Status: DC | PRN

## 2022-01-03 MED ORDER — ADULT MULTIVITAMIN W/MINERALS CH
1.0000 | ORAL_TABLET | Freq: Every day | ORAL | Status: DC
  Administered 2022-01-03 – 2022-01-14 (×12): 1 via ORAL
  Filled 2022-01-03 (×12): qty 1

## 2022-01-03 MED ORDER — THIAMINE HCL 100 MG/ML IJ SOLN
100.0000 mg | Freq: Every day | INTRAMUSCULAR | Status: DC
  Filled 2022-01-03: qty 2

## 2022-01-03 NOTE — Care Management Obs Status (Signed)
MEDICARE OBSERVATION STATUS NOTIFICATION   Patient Details  Name: Dale Edwards MRN: [id number] Date of Birth: [birth year]   Medicare Observation Status Notification Given:  Yes    Lockie Pares, RN 01/03/2022, 8:58 AM

## 2022-01-03 NOTE — Progress Notes (Signed)
Subjective: Patient reports doing well no headaches, no acute events overnight  Objective: Vital signs in last 24 hours: Temp:  [97.7 F (36.5 C)-99.1 F (37.3 C)] 97.7 F (36.5 C) (06/08 0802) Pulse Rate:  [53-89] 66 (06/08 0802) Resp:  [16-18] 17 (06/08 0802) BP: (104-120)/(59-80) 115/76 (06/08 0802) SpO2:  [95 %-100 %] 96 % (06/08 0802) Weight:  [66.7 kg] 66.7 kg (06/08 0218)  Intake/Output from previous day: 06/07 0701 - 06/08 0700 In: 320 [P.O.:320] Out: 100 [Urine:100] Intake/Output this shift: No intake/output data recorded.  Neurologic: Grossly normal  Lab Results: Lab Results  Component Value Date   WBC 6.3 01/03/2022   HGB 15.2 01/03/2022   HCT 44.2 01/03/2022   MCV 96.5 01/03/2022   PLT 285 01/03/2022   Lab Results  Component Value Date   INR 1.1 01/02/2022   BMET Lab Results  Component Value Date   NA 136 01/03/2022   K 3.5 01/03/2022   CL 105 01/03/2022   CO2 24 01/03/2022   GLUCOSE 127 (H) 01/03/2022   BUN 15 01/03/2022   CREATININE 0.79 01/03/2022   CALCIUM 8.3 (L) 01/03/2022    Studies/Results: CT Head Wo Contrast  Result Date: 01/02/2022 CLINICAL DATA:  Head trauma, moderate-severe EXAM: CT HEAD WITHOUT CONTRAST TECHNIQUE: Contiguous axial images were obtained from the base of the skull through the vertex without intravenous contrast. RADIATION DOSE REDUCTION: This exam was performed according to the departmental dose-optimization program which includes automated exposure control, adjustment of the mA and/or kV according to patient size and/or use of iterative reconstruction technique. COMPARISON:  None Available. FINDINGS: Brain: Mildly hypodense right cerebral convexity subdural hematoma is present measuring up to 2.8 cm in thickness. Much thinner subdural hematoma is present on the left measuring up to 5 mm in thickness. There is leftward midline shift measuring 8 mm at the level of the septum pellucidum. Prominence of the ventricles and sulci  reflects parenchymal volume loss. Patchy and confluent areas of low-density in the supratentorial white matter nonspecific but probably reflect stable chronic microvascular ischemic changes. There is no hydrocephalus. Vascular: There is atherosclerotic calcification at the skull base. Skull: Calvarium is unremarkable. Sinuses/Orbits: No acute finding. Other: Patchy left greater than right mastoid opacification. IMPRESSION: Right much larger than left cerebral convexity mildly hypodense subdural hematomas. There is mass effect on the underlying parenchyma with leftward midline shift measuring 8 mm. Absence of hyperdense hemorrhage suggests this is more subacute or alternatively, this could be acute if there is an underlying coagulopathy. Electronically Signed   By: Guadlupe Spanish M.D.   On: 01/02/2022 19:50   DG Hip Unilat W or Wo Pelvis 2-3 Views Right  Result Date: 01/02/2022 CLINICAL DATA:  RIGHT hip pain post fall EXAM: DG HIP (WITH OR WITHOUT PELVIS) 2-3V RIGHT COMPARISON:  12/27/2021 FINDINGS: Osseous mineralization low normal. Mild narrowing of RIGHT hip joint with mild spur formation at the RIGHT femoral head. SI joints and LEFT hip joint space preserved. No fracture, dislocation, or bone destruction. IMPRESSION: Mild degenerative changes RIGHT hip joint without acute osseous abnormalities. Electronically Signed   By: Ulyses Southward M.D.   On: 01/02/2022 15:54   DG Hip Unilat W or Wo Pelvis 2-3 Views Left  Result Date: 01/02/2022 CLINICAL DATA:  Fall with bilateral hip pain. EXAM: DG HIP (WITH OR WITHOUT PELVIS) 2-3V LEFT COMPARISON:  Left hip x-ray 12/27/2021 FINDINGS: There is no evidence of hip fracture or dislocation. There are mild degenerative changes of both hips, similar to the prior  study. Soft tissues are within normal limits. IMPRESSION: No acute osseous abnormality. Electronically Signed   By: Darliss Cheney M.D.   On: 01/02/2022 15:53    Assessment/Plan: S/p large right SDH admitted last  night. No new recommendations. We are planning for a craniotomy either tomorrow or Sunday.    LOS: 0 days    Tiana Loft [facility] 01/03/2022, 8:38 AM

## 2022-01-03 NOTE — Progress Notes (Signed)
Initial Nutrition Assessment  DOCUMENTATION CODES:   Non-severe (moderate) malnutrition in context of social or environmental circumstances  INTERVENTION:  Provide Ensure Enlive po BID, each supplement provides 350 kcal and 20 grams of protein.  Continue MVI once daily.   NUTRITION DIAGNOSIS:   Moderate Malnutrition related to  (homelessness) as evidenced by energy intake < or equal to 75% for > or equal to 1 month, moderate fat depletion, moderate muscle depletion.  GOAL:   Patient will meet greater than or equal to 90% of their needs  MONITOR:   PO intake, Supplement acceptance, Labs, Weight trends, Skin, I & O's  REASON FOR ASSESSMENT:   Malnutrition Screening Tool    ASSESSMENT:   66 year old prior EtOH, tobacco, homelessness, history of multiple sclerosis, presents with fall and back pain. CT scan showed a new subdural hematoma with midline shift to the left.  Plans for craniotomy tomorrow or Sunday. Pt reports good appetite currently. Meal completion 100%. Pt reports usual intake of 1 meal a day or "what he can get his hands on". He reports his 1 meal would mostly not include a protein source due to homelessness. RD to order nutritional supplements to aid in caloric and protein needs. RD to additionally order vitamin labs to check for vitamin/mineral deficiencies due to history of ETOH and poor nutritional status.   RD to order vitamin labs: Vitamin A, D, E, K, C, folate, B12, B6, zinc alongside a CRP level.   NUTRITION - FOCUSED PHYSICAL EXAM:  Flowsheet Row Most Recent Value  Orbital Region Moderate depletion  Upper Arm Region Moderate depletion  Thoracic and Lumbar Region Moderate depletion  Buccal Region Moderate depletion  Temple Region Moderate depletion  Clavicle Bone Region Moderate depletion  Clavicle and Acromion Bone Region Moderate depletion  Scapular Bone Region Unable to assess  Dorsal Hand Moderate depletion  Patellar Region Moderate depletion   Anterior Thigh Region Moderate depletion  Posterior Calf Region Moderate depletion  Edema (RD Assessment) Mild  Hair Reviewed  Eyes Reviewed  Mouth Reviewed  Skin Reviewed  Nails Reviewed      Labs and medications reviewed.   Diet Order:   Diet Order             Diet regular Room service appropriate? Yes; Fluid consistency: Thin  Diet effective now                   EDUCATION NEEDS:   Not appropriate for education at this time  Skin:  Skin Assessment: Skin Integrity Issues: Skin Integrity Issues:: Other (Comment) Other: dermatitis R buttocks  Last BM:  6/7  Height:   Ht Readings from Last 1 Encounters:  01/03/22 5\' 6"  (1.676 m)    Weight:   Wt Readings from Last 1 Encounters:  01/03/22 66.7 kg   BMI:  Body mass index is 23.73 kg/m.  Estimated Nutritional Needs:   Kcal:  2000-2200  Protein:  100-110 grams  Fluid:  >/= 2 L/day  03/05/22, MS, RD, LDN RD pager number/after hours weekend pager number on Am[employer].

## 2022-01-03 NOTE — Plan of Care (Signed)
  Problem: Health Behavior/Discharge Planning: Goal: Ability to manage health-related needs will improve Outcome: Progressing   Problem: Nutrition: Goal: Adequate nutrition will be maintained Outcome: Progressing   Problem: Coping: Goal: Level of anxiety will decrease Outcome: Progressing   Problem: Elimination: Goal: Will not experience complications related to bowel motility Outcome: Progressing Goal: Will not experience complications related to urinary retention Outcome: Progressing   Problem: Pain Managment: Goal: General experience of comfort will improve Outcome: Progressing   Problem: Safety: Goal: Ability to remain free from injury will improve Outcome: Progressing   

## 2022-01-03 NOTE — TOC Initial Note (Signed)
Transition of Care [facility]) - Initial/Assessment Note    Patient Details  Name: Dale Edwards MRN: [id number] Date of Birth: Dec 25, 1955  Transition of Care [facility]) CM/SW Contact:    Pollie Friar, RN Phone Number: 01/03/2022, 1:07 PM  Clinical Narrative:                 Patient is homeless and states he stays all over. CM inquired about him going to a shelter and he states that he thought he needed a referral. CM let him know he just needed to call or go by to get in if they have space.  Cm consulted for transportation issues. Pt has medicaid that will provide him transportation if he will arrange it. CM inquired about him calling and he states he knows he can use medicaid transportation but "not always that easy".  Pt states he also uses the bus at times or walks. PCP: [facility]--This MD office also provides transportation to the Clinic and back for appointments. He would just need to arrange transportation with the clinic. Pt states he only uses inhalers outside the hospital.  [facility] following for further d/c needs after his surgery. May need PT/OT evals to see d/c needs as he has had multiple falls.   Expected Discharge Plan: Homeless Shelter Barriers to Discharge: Continued Medical Work up   Patient Goals and CMS Choice        Expected Discharge Plan and Services Expected Discharge Plan: [place]   Discharge Planning Services: CM Consult   Living arrangements for the past 2 months: Homeless                                      Prior Living Arrangements/Services Living arrangements for the past 2 months: Homeless Lives with:: Self Patient language and need for interpreter reviewed:: Yes Do you feel safe going back to the place where you live?: Yes          Current home services: DME Agricultural consultant) Criminal Activity/Legal Involvement Pertinent to Current Situation/Hospitalization: No - Comment as needed  Activities of  Daily Living Home Assistive Devices/Equipment: Eyeglasses, Environmental consultant (specify type) (reading glasses) ADL Screening (condition at time of admission) Patient's cognitive ability adequate to safely complete daily activities?: Yes Is the patient deaf or have difficulty hearing?: Yes (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) Does the patient have difficulty seeing, even when wearing glasses/contacts?: No Does the patient have difficulty concentrating, remembering, or making decisions?: No Patient able to express need for assistance with ADLs?: Yes Does the patient have difficulty dressing or bathing?: Yes Independently performs ADLs?: No Communication: Independent Dressing (OT): Independent Grooming: Independent Feeding: Independent Bathing: Independent Toileting: Needs assistance Is this a change from baseline?: Pre-admission baseline In/Out Bed: Needs assistance Is this a change from baseline?: Pre-admission baseline Walks in Home: Needs assistance Is this a change from baseline?: Pre-admission baseline Does the patient have difficulty walking or climbing stairs?: Yes Weakness of Legs: Both (legs are very weak) Weakness of Arms/Hands: Both  Permission Sought/Granted                  Emotional Assessment Appearance:: Appears older than stated age Attitude/Demeanor/Rapport: Engaged Affect (typically observed): Apprehensive Orientation: : Oriented to Self, Oriented to Place, Oriented to  Time, Oriented to Situation   Psych Involvement: No (comment)  Admission diagnosis:  Subdural hematoma (  [place]) [S06.5XAA] Fall, initial encounter [id number]] Wound of buttock, unspecified laterality, subsequent encounter [S31.809D] Patient Active Problem List   Diagnosis Date Noted   Subdural hematoma ([place]) 01/02/2022   Decubitus ulcer of buttock 01/02/2022   Generalized weakness 07/12/2021   Tobacco abuse 07/12/2021   Alcohol abuse 07/12/2021   Homeless 07/12/2021   COPD (chronic  obstructive pulmonary disease) ([place]) 07/12/2021   Prolonged QT interval 07/12/2021   Relapsing remitting multiple sclerosis ([place]) 10/12/2019   Tremor of right hand 10/12/2019   Gait abnormality 10/12/2019   Tremor 10/30/2017   Vitamin D deficiency 04/10/2017   Lumbar radiculopathy 11/15/2014   Low back pain 08/01/2014   Multiple sclerosis ([place]) 02/09/2013   Abnormality of gait 01/08/2013   Right sided weakness 01/08/2013   EMPHYSEMA 02/17/2009   DYSPNEA 02/17/2009   PCP:  Pcp, No Pharmacy:   [facility] [place], [place] [address] Phone: [phone] Fax: [phone]  [facility] [address] [place] [address] Phone: [phone] Fax: [phone]     Social Determinants of Health (SDOH) Interventions    Readmission Risk Interventions     No data to display

## 2022-01-03 NOTE — Consult Note (Signed)
WOC Nurse Consult Note: Reason for Consult:Moisture associated skin damage to bilateral buttocks with peeling epithelium and nonintact lesion to right gluteal fold.  Wound type:MASD, present on admission Pressure Injury POA: NA Measurement: right gluteal: 1 cm x 0.5 cm x 0.1 cm  Left gluteal is pink with peeling epithelium  wears cotton underpants at home and has frequent episodes of incontinence, he reports.  Wound MVE:HMCN moist Drainage (amount, consistency, odor) scant weeping Periwound: blanchable erythema to bilateral gluteal folds.  Dressing procedure/placement/frequency: Cleanse buttocks with NS and pat dry. Apply Gerhardts cream to both sides.  Keep skin clean and dry No disposable pads or briefs.  Skin open to air and in contact with dermatherapy linen.  Will not follow at this time.  Please re-consult if needed.  [place]le [place] MSN, RN, FNP-BC CWON Wound, Ostomy, Continence Nurse Pager [phone]

## 2022-01-03 NOTE — TOC CAGE-AID Note (Signed)
Transition of Care [facility]) - CAGE-AID Screening   Patient Details  Name: Dale Edwards MRN: [id number] Date of Birth: [birth year]  Transition [facility]) CM/SW Contact:    Lauris Serviss C Tarpley-Carter, LCSWA Phone Number: 01/03/2022, 9:31 AM   Clinical Narrative: Pt participated in Cage-Aid.  Pt stated he does use substance and ETOH.  Pt was offered resources, due to usage of substance and ETOH.     [name] Tarpley-Carter, MSW, LCSW-A Pronouns:  She/Her/Hers [facility] Clinical Social Worker Direct Number:  [phone] [name]  CAGE-AID Screening:    Have You Ever Felt You Ought to Cut Down on Your Drinking or Drug Use?: Yes Have People Annoyed You By Critizing Your Drinking Or Drug Use?: No Have You Felt Bad Or Guilty About Your Drinking Or Drug Use?: No Have You Ever Had a Drink or Used Drugs First Thing In The Morning to Steady Your Nerves or to Get Rid of a Hangover?: No CAGE-AID Score: 1  Substance Abuse Education Offered: Yes  Substance abuse interventions: Transport planner

## 2022-01-03 NOTE — Progress Notes (Addendum)
PROGRESS NOTE   Dale Edwards  AJO:[id number] DOB: 1956/04/19 DOA: 01/02/2022 PCP: Oneita Hurt, No  Brief Narrative:   66 year old home dwell W male Prior EtOH, tobacco, homelessness-admission 07/12/2021 with fall and laceration to left forehead at the time found to have aspiration bone lucencies aspiration pneumonia hypoxic respiratory failure--hospitalization showed no evidence of cancer-IR was consulted but biopsies of lucencies could not be done and patient was to follow-up with Dr. Pamelia Hoit He also had prolonged QTc Previously followed by Dr. Debarah Crape neurology for MS diagnosed in 2014?-Started on Tecfidera 02/2013 complicated by JC virus being positive and apparently was lost to follow-up secondary to transport issues homelessness until the last office visit 04/10/2021 Was seen in ED 12/27/2021 with a headache?  Fall which-x-rays were performed and patient was discharged  Patient returned to ED 6/7 as he had fallen [place] landing on his buttocks-denied any head injury or loss of consciousness has some chronic buttock wounds--he was about to be discharged   patient fell at the emergency room around 651 while trying to stand on his own which was unwitnessed CT scan showed new subdural hematoma with midline shift to the left compared to prior 2023 patient was transported from [facility] to [facility]  CT showed a large right holohemispheric chronic SDH with small left SDH leftward midline shift measuring 8 mm patient was admitted by medicine to a progressive bed  Hospital-Problem based course   Subdural hemorrhage 8 mm shift  Neurosurgery deciding on further management and planning--I suppose his chronic alcoholism was in some ways neuroprotective as he had underlying alcoholic encephalomalacia which may have protected him from mass effect Every 2 neurochecks-defer antiepileptic agent/steroids to them and keep progressive status today Fall Imaging was negative other than CT 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 Earl Lagos need further discussion in the outpatient setting regarding planning Will need outpatient MRI  DVT prophylaxis: SCD at this time Code Status: Full CODE STATUS Family Communication: None at the bedside at this time Disposition:  Status is: Observation The patient will require care spanning > 2 midnights and should be moved to inpatient because:   Will probably require surgery   Consultants:  Neurosurgery  Procedures: None currently  Antimicrobials: None   Subjective: Patient is awake coherent he arouses somewhat well He is asking to eat He tells me he has a chronic tremor of his upper extremities  Objective: Vitals:   01/02/22 1853 01/02/22 1900 01/02/22 2300 01/03/22 0218  BP: (!) 109/59 104/80 116/80 108/73  Pulse: 86 89 88 78  Resp: 18 18 16    Temp: 99 F (37.2 C)  99.1 F (37.3 C) 98.3 F (36.8 C)  TempSrc: Oral  Oral Oral  SpO2: 95% 99% 99% 98%  Weight:    66.7 kg  Height:    5\' 6"  (1.676 m)    Intake/Output Summary (Last 24 hours) at 01/03/2022 0740 Last data filed at 01/03/2022 0210 Gross per 24 hour  Intake 320 ml  Output 100 ml  Net 220 ml   Filed Weights   01/03/22 0218  Weight: 66.7 kg    Examination:  Alert coherent can tell me date year EOMI NCAT pupils equal Tongue protrudes midline power is 5/5 upper extremities but some tremor noted--reflexes 5 equivocal to my exam  S1-S2 no murmur no rub no gallop Abdomen is soft no rebound no guarding Chest is clear  Data Reviewed: personally reviewed   CBC    Component Value Date/Time   WBC 6.3 01/03/2022 0537   RBC 4.58 01/03/2022 0537   HGB 15.2 01/03/2022 0537   HGB 17.1 09/26/2020 1422   HCT 44.2 01/03/2022 0537    HCT 51.4 (H) 09/26/2020 1422   PLT 285 01/03/2022 0537   PLT 236 09/26/2020 1422   MCV 96.5 01/03/2022 0537   MCV 96 09/26/2020 1422   MCH 33.2 01/03/2022 0537   MCHC 34.4 01/03/2022 0537   RDW 13.4 01/03/2022 0537   RDW 12.6 09/26/2020 1422   LYMPHSABS 1.7 01/03/2022 0537   LYMPHSABS 2.4 09/26/2020 1422   MONOABS 0.6 01/03/2022 0537   EOSABS 0.3 01/03/2022 0537   EOSABS 0.3 09/26/2020 1422   BASOSABS 0.1 01/03/2022 0537   BASOSABS 0.1 09/26/2020 1422      Latest Ref Rng & Units 01/03/2022    5:37 AM 11/26/2021   12:08 AM 11/23/2021    9:30 PM  CMP  Glucose 70 - 99 mg/dL 366  79  80   BUN 8 - 23 mg/dL 15  11  13    Creatinine 0.61 - 1.24 mg/dL 2.94  7.65  4.65   Sodium 135 - 145 mmol/L 136  137  140   Potassium 3.5 - 5.1 mmol/L 3.5  3.8  4.1   Chloride 98 - 111 mmol/L 105  105  103   CO2 22 - 32 mmol/L 24  21  30    Calcium 8.9 - 10.3 mg/dL 8.3  8.8  9.1   Total Protein 6.5 - 8.1 g/dL 5.2  6.2  6.7   Total Bilirubin 0.3 - 1.2 mg/dL 0.7  0.7  1.4   Alkaline Phos 38 - 126 U/L 53  62  65   AST 15 - 41 U/L 16  18  16    ALT 0 - 44 U/L 20  16  15       Radiology Studies: CT Head Wo Contrast  Result Date: 01/02/2022 CLINICAL DATA:  Head trauma, moderate-severe EXAM: CT HEAD WITHOUT CONTRAST TECHNIQUE: Contiguous axial images were obtained from the base of the skull through the vertex without intravenous contrast. RADIATION DOSE REDUCTION: This exam was performed according to the departmental dose-optimization program which includes automated exposure control, adjustment of the mA and/or kV according to patient size and/or use of iterative reconstruction technique. COMPARISON:  None Available. FINDINGS: Brain: Mildly hypodense right cerebral convexity subdural hematoma is present measuring up to 2.8 cm in thickness. Much thinner subdural hematoma is present on the left measuring up to 5 mm in thickness. There is leftward midline shift measuring 8 mm at the level of the septum pellucidum.  Prominence of the ventricles and sulci reflects parenchymal volume loss. Patchy and confluent areas of low-density in the supratentorial white matter nonspecific but probably reflect stable chronic microvascular ischemic changes. There is no hydrocephalus. Vascular: There is atherosclerotic calcification at the skull base. Skull: Calvarium is unremarkable. Sinuses/Orbits: No acute finding. Other: Patchy left greater than right mastoid opacification. IMPRESSION: Right much larger than left cerebral convexity mildly hypodense subdural hematomas. There is mass effect on the underlying parenchyma with leftward midline shift measuring 8 mm. Absence of hyperdense hemorrhage suggests this is more subacute or alternatively, this could be acute if there is an underlying coagulopathy. Electronically Signed   By: Guadlupe Spanish M.D.   On: 01/02/2022 19:50  DG Hip Unilat W or Wo Pelvis 2-3 Views Right  Result Date: 01/02/2022 CLINICAL DATA:  RIGHT hip pain post fall EXAM: DG HIP (WITH OR WITHOUT PELVIS) 2-3V RIGHT COMPARISON:  12/27/2021 FINDINGS: Osseous mineralization low normal. Mild narrowing of RIGHT hip joint with mild spur formation at the RIGHT femoral head. SI joints and LEFT hip joint space preserved. No fracture, dislocation, or bone destruction. IMPRESSION: Mild degenerative changes RIGHT hip joint without acute osseous abnormalities. Electronically Signed   By: Ulyses Southward M.D.   On: 01/02/2022 15:54   DG Hip Unilat W or Wo Pelvis 2-3 Views Left  Result Date: 01/02/2022 CLINICAL DATA:  Fall with bilateral hip pain. EXAM: DG HIP (WITH OR WITHOUT PELVIS) 2-3V LEFT COMPARISON:  Left hip x-ray 12/27/2021 FINDINGS: There is no evidence of hip fracture or dislocation. There are mild degenerative changes of both hips, similar to the prior study. Soft tissues are within normal limits. IMPRESSION: No acute osseous abnormality. Electronically Signed   By: Darliss Cheney M.D.   On: 01/02/2022 15:53     Scheduled  Meds:  folic acid  1 mg Oral Daily   multivitamin with minerals  1 tablet Oral Daily   thiamine  100 mg Oral Daily   Or   thiamine  100 mg Intravenous Daily   Continuous Infusions:   LOS: 0 days   Time spent: 61  Rhetta Mura, MD [facility] To contact the attending provider between 7A-7P or the covering provider during after hours 7P-7A, please log into the web site [email or web address] and access using universal [place] password for that web site. If you do not have the password, please call the hospital operator.  01/03/2022, 7:40 AM

## 2022-01-04 ENCOUNTER — Other Ambulatory Visit: Payer: Self-pay | Admitting: Neurological Surgery

## 2022-01-04 DIAGNOSIS — S065XAA Traumatic subdural hemorrhage with loss of consciousness status unknown, initial encounter: Secondary | ICD-10-CM | POA: Diagnosis not present

## 2022-01-04 LAB — BASIC METABOLIC PANEL
Anion gap: 4 — ABNORMAL LOW (ref 5–15)
BUN: 19 mg/dL (ref 8–23)
CO2: 26 mmol/L (ref 22–32)
Calcium: 8.3 mg/dL — ABNORMAL LOW (ref 8.9–10.3)
Chloride: 107 mmol/L (ref 98–111)
Creatinine, Ser: 0.87 mg/dL (ref 0.61–1.24)
GFR, Estimated: >60 mL/min (ref >60)
Glucose, Bld: 116 mg/dL — ABNORMAL HIGH (ref 70–99)
Potassium: 4 mmol/L (ref 3.5–5.1)
Sodium: 137 mmol/L (ref 135–145)

## 2022-01-04 NOTE — Progress Notes (Signed)
PROGRESS NOTE   Dale Edwards  WLN:[id number] DOB: 24-Aug-1955 DOA: 01/02/2022 PCP: Oneita Hurt, No  Brief Narrative:   66 year old home dwell W male Prior EtOH, tobacco, homelessness-admission 07/12/2021 with fall and laceration to left forehead at the time found to have aspiration bone lucencies aspiration pneumonia hypoxic respiratory failure--hospitalization showed no evidence of cancer-IR was consulted but biopsies of lucencies could not be done and patient was to follow-up with Dr. Pamelia Hoit He also had prolonged QTc Previously followed by Dr. Debarah Crape neurology for MS diagnosed in 2014?-Started on Tecfidera 02/2013 complicated by JC virus being positive and apparently was lost to follow-up secondary to transport issues homelessness until the last office visit 04/10/2021 Was seen in ED 12/27/2021 with a headache?  Fall which-x-rays were performed and patient was discharged  Patient returned to ED 6/7 as [place] on his [place]ocks-denied any head injury or loss of consciousness has some chronic buttock wounds--he was about to be discharged   patient fell at the emergency room around 651 while trying to stand on his own which was unwitnessed CT scan showed new subdural hematoma with midline shift to the left compared to prior 2023 patient was transported from [facility] to [facility]  CT showed a large right holohemispheric chronic SDH with small left SDH leftward midline shift measuring 8 mm patient was admitted by medicine to a progressive bed  Hospital-Problem based course   Subdural hemorrhage 8 mm shift Neurosurgery 6/11 performing definitive surgery Every 2 neurochecks-defer antiepileptic agent/steroids to them and keep progressive status today Fall Imaging was negative other than CT Chronic ethanolism CIWA ranging 0-1 We will discontinue the protocol in the morning Chronic multiple sclerosis at baseline-previously on Tecfidera but lost follow-up Patient was lost  to follow-up and follows with Dr. Sonny Masters need further discussion in the outpatient setting regarding planning Will need outpatient MRI  DVT prophylaxis: SCD at this time Code Status: Full CODE STATUS Family Communication: No family is present or available at the bedside He states that his Sister [name] would probably be his next of kin but he has not been in touch with her for a long time Disposition:  Status is: Observation The patient will require care spanning > 2 midnights and should be moved to inpatient because:   Will probably require surgery   Consultants:  Neurosurgery  Procedures: None currently  Antimicrobials: None   Subjective:  Patient seen and examined laying in bed trying to get back to sleep He confirms that he is pretty much homeless has no family that looks in on him "they do not give a" Tells me that he usually has tremor of his right hand from his MS He is moving all 4 limbs equally   Objective: Vitals:   01/03/22 2312 01/04/22 0315 01/04/22 0500 01/04/22 0816  BP: 111/60 (!) 120/57  108/63  Pulse: 72 (!) 59  61  Resp: 17 19  20   Temp: 98.1 F (36.7 C) 97.8 F (36.6 C)    TempSrc: Oral Oral    SpO2: 97% 99%  99%  Weight:   68.9 kg   Height:        Intake/Output Summary (Last 24 hours) at 01/04/2022 0940 Last data filed at 01/03/2022 1300 Gross per 24 hour  Intake 360 ml  Output 600 ml  Net -240 ml    Filed Weights   01/03/22 0218 01/04/22 0500  Weight: 66.7 kg 68.9 kg    Examination:  Pupils equally reactive Moving  4 limbs relatively well without any deficits or limitation other than the right upper extremity which seems to have fasciculation Chest is clear no added sound no rales rhonchi without wheeze Abdomen is soft ROM is intact without any focal deficit I deferred reflex testing Psych is euthymic  Data Reviewed: personally reviewed   CBC    Component Value Date/Time   WBC 6.3 01/03/2022 0537   RBC 4.66 01/03/2022 1809    RBC 4.58 01/03/2022 0537   HGB 15.2 01/03/2022 0537   HGB 17.1 09/26/2020 1422   HCT 44.2 01/03/2022 0537   HCT 51.4 (H) 09/26/2020 1422   PLT 285 01/03/2022 0537   PLT 236 09/26/2020 1422   MCV 96.5 01/03/2022 0537   MCV 96 09/26/2020 1422   MCH 33.2 01/03/2022 0537   MCHC 34.4 01/03/2022 0537   RDW 13.4 01/03/2022 0537   RDW 12.6 09/26/2020 1422   LYMPHSABS 1.7 01/03/2022 0537   LYMPHSABS 2.4 09/26/2020 1422   MONOABS 0.6 01/03/2022 0537   EOSABS 0.3 01/03/2022 0537   EOSABS 0.3 09/26/2020 1422   BASOSABS 0.1 01/03/2022 0537   BASOSABS 0.1 09/26/2020 1422      Latest Ref Rng & Units 01/04/2022    1:59 AM 01/03/2022    5:37 AM 11/26/2021   12:08 AM  CMP  Glucose 70 - 99 mg/dL 116  127  79   BUN 8 - 23 mg/dL 19  15  11    Creatinine 0.61 - 1.24 mg/dL 0.87  0.79  0.75   Sodium 135 - 145 mmol/L 137  136  137   Potassium 3.5 - 5.1 mmol/L 4.0  3.5  3.8   Chloride 98 - 111 mmol/L 107  105  105   CO2 22 - 32 mmol/L 26  24  21    Calcium 8.9 - 10.3 mg/dL 8.3  8.3  8.8   Total Protein 6.5 - 8.1 g/dL  5.2  6.2   Total Bilirubin 0.3 - 1.2 mg/dL  0.7  0.7   Alkaline Phos 38 - 126 U/L  53  62   AST 15 - 41 U/L  16  18   ALT 0 - 44 U/L  20  16      Radiology Studies: CT Head Wo Contrast  Result Date: 01/02/2022 CLINICAL DATA:  Head trauma, moderate-severe EXAM: CT HEAD WITHOUT CONTRAST TECHNIQUE: Contiguous axial images were obtained from the base of the skull through the vertex without intravenous contrast. RADIATION DOSE REDUCTION: This exam was performed according to the departmental dose-optimization program which includes automated exposure control, adjustment of the mA and/or kV according to patient size and/or use of iterative reconstruction technique. COMPARISON:  None Available. FINDINGS: Brain: Mildly hypodense right cerebral convexity subdural hematoma is present measuring up to 2.8 cm in thickness. Much thinner subdural hematoma is present on the left measuring up to 5 mm in  thickness. There is leftward midline shift measuring 8 mm at the level of the septum pellucidum. Prominence of the ventricles and sulci reflects parenchymal volume loss. Patchy and confluent areas of low-density in the supratentorial white matter nonspecific but probably reflect stable chronic microvascular ischemic changes. There is no hydrocephalus. Vascular: There is atherosclerotic calcification at the skull base. Skull: Calvarium is unremarkable. Sinuses/Orbits: No acute finding. Other: Patchy left greater than right mastoid opacification. IMPRESSION: Right much larger than left cerebral convexity mildly hypodense subdural hematomas. There is mass effect on the underlying parenchyma with leftward midline shift measuring 8 mm. Absence of hyperdense hemorrhage  suggests this is more subacute or alternatively, this could be acute if there is an underlying coagulopathy. Electronically Signed   By: Macy Mis M.D.   On: 01/02/2022 19:50   DG Hip Unilat W or Wo Pelvis 2-3 Views Right  Result Date: 01/02/2022 CLINICAL DATA:  RIGHT hip pain post fall EXAM: DG HIP (WITH OR WITHOUT PELVIS) 2-3V RIGHT COMPARISON:  12/27/2021 FINDINGS: Osseous mineralization low normal. Mild narrowing of RIGHT hip joint with mild spur formation at the RIGHT femoral head. SI joints and LEFT hip joint space preserved. No fracture, dislocation, or bone destruction. IMPRESSION: Mild degenerative changes RIGHT hip joint without acute osseous abnormalities. Electronically Signed   By: Lavonia Dana M.D.   On: 01/02/2022 15:54   DG Hip Unilat W or Wo Pelvis 2-3 Views Left  Result Date: 01/02/2022 CLINICAL DATA:  Fall with bilateral hip pain. EXAM: DG HIP (WITH OR WITHOUT PELVIS) 2-3V LEFT COMPARISON:  Left hip x-ray 12/27/2021 FINDINGS: There is no evidence of hip fracture or dislocation. There are mild degenerative changes of both hips, similar to the prior study. Soft tissues are within normal limits. IMPRESSION: No acute osseous  abnormality. Electronically Signed   By: Ronney Asters M.D.   On: 01/02/2022 15:53     Scheduled Meds:  feeding supplement  237 mL Oral BID BM   folic acid  1 mg Oral Daily   Gerhardt's butt cream   Topical BID   multivitamin with minerals  1 tablet Oral Daily   thiamine  100 mg Oral Daily   Or   thiamine  100 mg Intravenous Daily   Continuous Infusions:   LOS: 1 day   Time spent: 68  Nita Sells, MD [facility] To contact the attending provider between 7A-7P or the covering provider during after hours 7P-7A, please log into the web site [email or web address] and access using universal [place] password for that web site. If you do not have the password, please call the hospital operator.  01/04/2022, 9:40 AM

## 2022-01-04 NOTE — Plan of Care (Signed)
Patient is alert and oriented X4. No complaints of headache or pain during the shift. Provided adequate rest. Will continue to monitor.   Problem: Education: Goal: Knowledge of General Education information will improve Description: Including pain rating scale, medication(s)/side effects and non-pharmacologic comfort measures Outcome: Progressing   Problem: Clinical Measurements: Goal: Ability to maintain clinical measurements within normal limits will improve Outcome: Progressing Goal: Respiratory complications will improve Outcome: Progressing Goal: Cardiovascular complication will be avoided Outcome: Progressing   Problem: Activity: Goal: Risk for activity intolerance will decrease Outcome: Progressing   Problem: Nutrition: Goal: Adequate nutrition will be maintained Outcome: Progressing   Problem: Coping: Goal: Level of anxiety will decrease Outcome: Progressing   Problem: Pain Managment: Goal: General experience of comfort will improve Outcome: Progressing   Problem: Safety: Goal: Ability to remain free from injury will improve Outcome: Progressing   Problem: Skin Integrity: Goal: Risk for impaired skin integrity will decrease Outcome: Progressing

## 2022-01-04 NOTE — Progress Notes (Signed)
Subjective: Patient reports denies any headaches or dizziness   Objective: Vital signs in last 24 hours: Temp:  [97.6 F (36.4 C)-98.7 F (37.1 C)] 97.8 F (36.6 C) (06/09 0315) Pulse Rate:  [59-82] 59 (06/09 0315) Resp:  [16-19] 19 (06/09 0315) BP: (110-120)/(47-76) 120/57 (06/09 0315) SpO2:  [95 %-99 %] 99 % (06/09 0315) Weight:  [68.9 kg] 68.9 kg (06/09 0500)  Intake/Output from previous day: 06/08 0701 - 06/09 0700 In: 360 [P.O.:360] Out: 750 [Urine:750] Intake/Output this shift: No intake/output data recorded.  Neurologic: Grossly normal  Lab Results: Lab Results  Component Value Date   WBC 6.3 01/03/2022   HGB 15.2 01/03/2022   HCT 44.2 01/03/2022   MCV 96.5 01/03/2022   PLT 285 01/03/2022   Lab Results  Component Value Date   INR 1.1 01/02/2022   BMET Lab Results  Component Value Date   NA 137 01/04/2022   K 4.0 01/04/2022   CL 107 01/04/2022   CO2 26 01/04/2022   GLUCOSE 116 (H) 01/04/2022   BUN 19 01/04/2022   CREATININE 0.87 01/04/2022   CALCIUM 8.3 (L) 01/04/2022    Studies/Results: CT Head Wo Contrast  Result Date: 01/02/2022 CLINICAL DATA:  Head trauma, moderate-severe EXAM: CT HEAD WITHOUT CONTRAST TECHNIQUE: Contiguous axial images were obtained from the base of the skull through the vertex without intravenous contrast. RADIATION DOSE REDUCTION: This exam was performed according to the departmental dose-optimization program which includes automated exposure control, adjustment of the mA and/or kV according to patient size and/or use of iterative reconstruction technique. COMPARISON:  None Available. FINDINGS: Brain: Mildly hypodense right cerebral convexity subdural hematoma is present measuring up to 2.8 cm in thickness. Much thinner subdural hematoma is present on the left measuring up to 5 mm in thickness. There is leftward midline shift measuring 8 mm at the level of the septum pellucidum. Prominence of the ventricles and sulci reflects  parenchymal volume loss. Patchy and confluent areas of low-density in the supratentorial white matter nonspecific but probably reflect stable chronic microvascular ischemic changes. There is no hydrocephalus. Vascular: There is atherosclerotic calcification at the skull base. Skull: Calvarium is unremarkable. Sinuses/Orbits: No acute finding. Other: Patchy left greater than right mastoid opacification. IMPRESSION: Right much larger than left cerebral convexity mildly hypodense subdural hematomas. There is mass effect on the underlying parenchyma with leftward midline shift measuring 8 mm. Absence of hyperdense hemorrhage suggests this is more subacute or alternatively, this could be acute if there is an underlying coagulopathy. Electronically Signed   By: Guadlupe Spanish M.D.   On: 01/02/2022 19:50   DG Hip Unilat W or Wo Pelvis 2-3 Views Right  Result Date: 01/02/2022 CLINICAL DATA:  RIGHT hip pain post fall EXAM: DG HIP (WITH OR WITHOUT PELVIS) 2-3V RIGHT COMPARISON:  12/27/2021 FINDINGS: Osseous mineralization low normal. Mild narrowing of RIGHT hip joint with mild spur formation at the RIGHT femoral head. SI joints and LEFT hip joint space preserved. No fracture, dislocation, or bone destruction. IMPRESSION: Mild degenerative changes RIGHT hip joint without acute osseous abnormalities. Electronically Signed   By: Ulyses Southward M.D.   On: 01/02/2022 15:54   DG Hip Unilat W or Wo Pelvis 2-3 Views Left  Result Date: 01/02/2022 CLINICAL DATA:  Fall with bilateral hip pain. EXAM: DG HIP (WITH OR WITHOUT PELVIS) 2-3V LEFT COMPARISON:  Left hip x-ray 12/27/2021 FINDINGS: There is no evidence of hip fracture or dislocation. There are mild degenerative changes of both hips, similar to the prior study. Soft  tissues are within normal limits. IMPRESSION: No acute osseous abnormality. Electronically Signed   By: Darliss Cheney M.D.   On: 01/02/2022 15:53    Assessment/Plan: S/p fall with large right SDH with midline  shift. We are going to plan on a crani for evacuation of SDH on Sunday afternoon. Please keep NPO Saturday at midnight and no blood thinners   LOS: 1 day    Dale Edwards 01/04/2022, 7:49 AM

## 2022-01-04 NOTE — Anesthesia Preprocedure Evaluation (Addendum)
Anesthesia Evaluation  Patient identified by MRN, date of birth, ID band Patient awake    Reviewed: Allergy & Precautions, NPO status , Patient's Chart, lab work & pertinent test results  History of Anesthesia Complications Negative for: history of anesthetic complications  Airway Mallampati: II  TM Distance: >3 FB Neck ROM: Full    Dental  (+) Loose, Poor Dentition, Missing, Chipped, Dental Advisory Given   Pulmonary shortness of breath, COPD,  COPD inhaler, Current Smoker and Patient abstained from smoking.,    breath sounds clear to auscultation       Cardiovascular negative cardio ROS   Rhythm:Regular Rate:Normal     Neuro/Psych SDH: S/p fall with large right SDH with midline shift. We are going to plan on a crani for evacuation of SDH on Sunday afternoon  Multiple sclerosis: tremor negative psych ROS   GI/Hepatic negative GI ROS, Neg liver ROS,   Endo/Other  negative endocrine ROS  Renal/GU negative Renal ROS     Musculoskeletal   Abdominal   Peds  Hematology negative hematology ROS (+)   Anesthesia Other Findings   Reproductive/Obstetrics                            Anesthesia Physical Anesthesia Plan  ASA: 3  Anesthesia Plan: General   Post-op Pain Management: Ofirmev IV (intra-op)*   Induction: Intravenous  PONV Risk Score and Plan: 1 and Ondansetron, Dexamethasone and Treatment may vary due to age or medical condition  Airway Management Planned: Oral ETT  Additional Equipment: Arterial line  Intra-op Plan:   Post-operative Plan: Extubation in OR  Informed Consent: I have reviewed the patients History and Physical, chart, labs and discussed the procedure including the risks, benefits and alternatives for the proposed anesthesia with the patient or authorized representative who has indicated his/her understanding and acceptance.     Dental advisory given  Plan  Discussed with: CRNA and Surgeon  Anesthesia Plan Comments:        Anesthesia Quick Evaluation

## 2022-01-05 ENCOUNTER — Inpatient Hospital Stay (HOSPITAL_COMMUNITY): Payer: Medicare Other

## 2022-01-05 DIAGNOSIS — S065XAA Traumatic subdural hemorrhage with loss of consciousness status unknown, initial encounter: Secondary | ICD-10-CM | POA: Diagnosis not present

## 2022-01-05 DIAGNOSIS — E440 Moderate protein-calorie malnutrition: Secondary | ICD-10-CM | POA: Insufficient documentation

## 2022-01-05 MED ORDER — OXYCODONE-ACETAMINOPHEN 5-325 MG PO TABS
1.0000 | ORAL_TABLET | Freq: Once | ORAL | Status: AC
  Administered 2022-01-05: 1 via ORAL
  Filled 2022-01-05: qty 1

## 2022-01-05 NOTE — Progress Notes (Signed)
PROGRESS NOTE   Dale Edwards  Dale Edwards DOB: 03/26/1956 DOA: 01/02/2022 PCP: Oneita Hurt, No  Brief Narrative:   66 year old home dwell W male Prior EtOH, tobacco, homelessness-admission 07/12/2021 with fall and laceration to left forehead at the time found to have aspiration bone lucencies aspiration pneumonia hypoxic respiratory failure--hospitalization showed no evidence of cancer-IR was consulted but biopsies of lucencies could not be done and patient was to follow-up with Dr. Pamelia Hoit He also had prolonged QTc Previously followed by Dr. Debarah Crape neurology for MS diagnosed in 2014?-Started on Tecfidera 02/2013 complicated by JC virus being positive and apparently was lost to follow-up secondary to transport issues homelessness until the last office visit 04/10/2021 Was seen in ED 12/27/2021 with a headache?  Fall which-x-rays were performed and patient was discharged  Patient returned to ED 6/7 as he had fallen at the bank landing on his buttocks-denied any head injury or loss of consciousness has some chronic buttock wounds--he was about to be discharged   patient fell at the emergency room around 651 while trying to stand on his own which was unwitnessed CT scan showed new subdural hematoma with midline shift to the left compared to prior 2023 patient was transported from [facility] [facility] to [facility]  CT showed a large right holohemispheric chronic SDH with small left SDH leftward midline shift measuring 8 mm patient was admitted by medicine to a progressive bed  Hospital-Problem based course   Subdural hemorrhage 8 mm shift Neurosurgery 6/11 performing definitive surgery Every 2 neurochecks-defer antiepileptic agent/steroids to NS Fall Imaging was negative other than CT 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 Sonny Masters need further discussion in the outpatient setting regarding planning Will need outpatient MRI  DVT prophylaxis: SCD at this time Code Status: Full CODE STATUS Family Communication: No family is present or available at the bedside He states that his Sister Dale Edwards is related to him but we do not have her number Disposition:  Status is: Observation The patient will require care spanning > 2 midnights and should be moved to inpatient because:   Will probably require surgery   Consultants:  Neurosurgery  Procedures: None currently  Antimicrobials: None   Subjective:  no complaints eating soup and food Going for surgery tomorrow  Objective: Vitals:   01/05/22 0411 01/05/22 0419 01/05/22 0805 01/05/22 1231  BP:  118/69 (!) 97/54 114/77  Pulse:  66 63   Resp:  18 18   Temp:  98.4 F (36.9 C) 98.2 F (36.8 C) 98.4 F (36.9 C)  TempSrc:  Oral  Oral  SpO2:  98% 100% 100%  Weight: 69.2 kg     Height:        Intake/Output Summary (Last 24 hours) at 01/05/2022 1353 Last data filed at 01/05/2022 0800 Gross per 24 hour  Intake --  Output 1650 ml  Net -1650 ml    Filed Weights   01/03/22 0218 01/04/22 0500 01/05/22 0411  Weight: 66.7 kg 68.9 kg 69.2 kg    Examination:  EOMI NCAT no focal deficit Moving 4 limbs equally Chest clear  Data Reviewed: personally reviewed   CBC    Component Value Date/Time   WBC 6.3 01/03/2022 0537   RBC 4.66 01/03/2022 1809   RBC 4.58 01/03/2022 0537   HGB 15.2 01/03/2022 0537   HGB 17.1 09/26/2020 1422   HCT  44.2 01/03/2022 0537   HCT 51.4 (H) 09/26/2020 1422   PLT 285 01/03/2022 0537   PLT 236 09/26/2020 1422   MCV 96.5 01/03/2022 0537   MCV 96 09/26/2020 1422   MCH 33.2 01/03/2022 0537   MCHC 34.4 01/03/2022 0537   RDW 13.4 01/03/2022 0537   RDW 12.6 09/26/2020 1422   LYMPHSABS 1.7 01/03/2022 0537   LYMPHSABS 2.4 09/26/2020 1422   MONOABS 0.6 01/03/2022 0537   EOSABS 0.3 01/03/2022 0537   EOSABS  0.3 09/26/2020 1422   BASOSABS 0.1 01/03/2022 0537   BASOSABS 0.1 09/26/2020 1422      Latest Ref Rng & Units 01/04/2022    1:59 AM 01/03/2022    5:37 AM 11/26/2021   12:08 AM  CMP  Glucose 70 - 99 mg/dL 174  944  79   BUN 8 - 23 mg/dL 19  15  11    Creatinine 0.61 - 1.24 mg/dL 9.67  5.91  6.38   Sodium 135 - 145 mmol/L 137  136  137   Potassium 3.5 - 5.1 mmol/L 4.0  3.5  3.8   Chloride 98 - 111 mmol/L 107  105  105   CO2 22 - 32 mmol/L 26  24  21    Calcium 8.9 - 10.3 mg/dL 8.3  8.3  8.8   Total Protein 6.5 - 8.1 g/dL  5.2  6.2   Total Bilirubin 0.3 - 1.2 mg/dL  0.7  0.7   Alkaline Phos 38 - 126 U/L  53  62   AST 15 - 41 U/L  16  18   ALT 0 - 44 U/L  20  16      Radiology Studies: CT HEAD WO CONTRAST ( )  Result Date: 01/05/2022 CLINICAL DATA:  Follow-up subdural hematoma EXAM: CT HEAD WITHOUT CONTRAST TECHNIQUE: Contiguous axial images were obtained from the base of the skull through the vertex without intravenous contrast. RADIATION DOSE REDUCTION: This exam was performed according to the departmental dose-optimization program which includes automated exposure control, adjustment of the mA and/or kV according to patient size and/or use of iterative reconstruction technique. COMPARISON:  3 days ago FINDINGS: Brain: Low but not CSF density subdural collection with signs of septation along the right cerebral convexity, up to 3 cm in thickness with prominent right cerebral mass effect. Midline shift measures up to 8 mm. No entrapment. Trace and more low-density subdural collection along the left cerebral convexity. Chronic white matter disease. No evidence of acute infarct. Vascular: No hyperdense vessel or unexpected calcification. Skull: Normal. Negative for fracture or focal lesion. Sinuses/Orbits: No acute finding. Chronic partial mastoid opacification. IMPRESSION: 1. Unchanged nonacute subdural hematoma along the right cerebral convexity with septations. Thickness measures up to 3 cm and  there is 8 mm of midline shift. 2. Trace and more simple subdural collection along the left cerebral convexity. Electronically Signed   By: Tiburcio Pea M.D.   On: 01/05/2022 08:13     Scheduled Meds:  feeding supplement  237 mL Oral BID BM   folic acid  1 mg Oral Daily   Gerhardt's butt cream   Topical BID   multivitamin with minerals  1 tablet Oral Daily   thiamine  100 mg Oral Daily   Or   thiamine  100 mg Intravenous Daily   Continuous Infusions:   LOS: 2 days   Time spent: 42  Rhetta Mura, MD [facility] To contact the attending provider between 7A-7P or the covering provider during after hours 7P-7A,  please log into the web site [email or web address] and access using universal [place] password for that web site. If you do not have the password, please call the hospital operator.  01/05/2022, 1:53 PM

## 2022-01-05 NOTE — Progress Notes (Signed)
No new issues or problems.  Patient awaiting bur hole evacuation tomorrow with Dr. Yetta Barre.

## 2022-01-06 ENCOUNTER — Inpatient Hospital Stay (HOSPITAL_COMMUNITY): Payer: Medicare Other | Admitting: Anesthesiology

## 2022-01-06 ENCOUNTER — Encounter (HOSPITAL_COMMUNITY)
Admission: EM | Disposition: A | Discharge status: Skilled Nursing Facility | Payer: Self-pay | Source: Home / Self Care | Attending: Neurological Surgery

## 2022-01-06 ENCOUNTER — Other Ambulatory Visit: Payer: Self-pay

## 2022-01-06 ENCOUNTER — Encounter (HOSPITAL_COMMUNITY): Payer: Self-pay | Admitting: Internal Medicine

## 2022-01-06 ENCOUNTER — Inpatient Hospital Stay (HOSPITAL_COMMUNITY): Payer: Medicare Other

## 2022-01-06 DIAGNOSIS — S065XAA Traumatic subdural hemorrhage with loss of consciousness status unknown, initial encounter: Secondary | ICD-10-CM | POA: Diagnosis not present

## 2022-01-06 DIAGNOSIS — F17210 Nicotine dependence, cigarettes, uncomplicated: Secondary | ICD-10-CM

## 2022-01-06 DIAGNOSIS — J449 Chronic obstructive pulmonary disease, unspecified: Secondary | ICD-10-CM | POA: Diagnosis not present

## 2022-01-06 DIAGNOSIS — Z9889 Other specified postprocedural states: Secondary | ICD-10-CM

## 2022-01-06 HISTORY — PX: CRANIOTOMY: SHX93

## 2022-01-06 LAB — VITAMIN K1, SERUM: VITAMIN K1: 0.73 ng/mL (ref 0.10–2.20)

## 2022-01-06 LAB — VITAMIN E
Vitamin E (Alpha Tocopherol): 8.1 mg/L — ABNORMAL LOW (ref 9.0–29.0)
Vitamin E(Gamma Tocopherol): 1.5 mg/L (ref 0.5–4.9)

## 2022-01-06 LAB — TYPE AND SCREEN
ABO/RH(D): A POS
Antibody Screen: NEGATIVE

## 2022-01-06 LAB — VITAMIN A: Vitamin A (Retinoic Acid): 26.2 ug/dL (ref 22.0–69.5)

## 2022-01-06 LAB — SURGICAL PCR SCREEN
MRSA, PCR: NEGATIVE
Staphylococcus aureus: POSITIVE — AB

## 2022-01-06 LAB — VITAMIN C: Vitamin C: 0.2 mg/dL — ABNORMAL LOW (ref 0.4–2.0)

## 2022-01-06 SURGERY — CRANIOTOMY HEMATOMA EVACUATION SUBDURAL
Anesthesia: General | Site: Head | Laterality: Right

## 2022-01-06 MED ORDER — MUPIROCIN 2 % EX OINT
1.0000 "application " | TOPICAL_OINTMENT | Freq: Two times a day (BID) | CUTANEOUS | Status: AC
  Administered 2022-01-06 – 2022-01-10 (×10): 1 via NASAL
  Filled 2022-01-06 (×2): qty 22

## 2022-01-06 MED ORDER — PHENYLEPHRINE 80 MCG/ML (10ML) SYRINGE FOR IV PUSH (FOR BLOOD PRESSURE SUPPORT)
PREFILLED_SYRINGE | INTRAVENOUS | Status: DC | PRN
  Administered 2022-01-06: 160 ug via INTRAVENOUS
  Administered 2022-01-06: 80 ug via INTRAVENOUS
  Administered 2022-01-06: 160 ug via INTRAVENOUS

## 2022-01-06 MED ORDER — LABETALOL HCL 5 MG/ML IV SOLN
INTRAVENOUS | Status: AC
  Filled 2022-01-06: qty 4

## 2022-01-06 MED ORDER — SODIUM CHLORIDE 0.9 % IV SOLN: INTRAVENOUS | Status: DC

## 2022-01-06 MED ORDER — ONDANSETRON HCL 4 MG PO TABS: 4.0000 mg | ORAL_TABLET | ORAL | Status: DC | PRN

## 2022-01-06 MED ORDER — SODIUM CHLORIDE 0.9 % IV SOLN: INTRAVENOUS | Status: DC | PRN

## 2022-01-06 MED ORDER — EPHEDRINE 5 MG/ML INJ
INTRAVENOUS | Status: AC
  Filled 2022-01-06: qty 5

## 2022-01-06 MED ORDER — BACITRACIN ZINC 500 UNIT/GM EX OINT
TOPICAL_OINTMENT | CUTANEOUS | Status: AC
Start: 2022-01-06 — End: ?
  Filled 2022-01-06: qty 28.35

## 2022-01-06 MED ORDER — SENNA 8.6 MG PO TABS
1.0000 | ORAL_TABLET | Freq: Two times a day (BID) | ORAL | Status: DC
  Administered 2022-01-06 – 2022-01-14 (×13): 8.6 mg via ORAL
  Filled 2022-01-06 (×15): qty 1

## 2022-01-06 MED ORDER — MORPHINE SULFATE (PF) 2 MG/ML IV SOLN
1.0000 mg | INTRAVENOUS | Status: DC | PRN
  Administered 2022-01-06: 1 mg via INTRAVENOUS
  Administered 2022-01-08 – 2022-01-11 (×10): 2 mg via INTRAVENOUS
  Filled 2022-01-06 (×11): qty 1

## 2022-01-06 MED ORDER — OXYCODONE HCL 5 MG PO TABS: 5.0000 mg | ORAL_TABLET | Freq: Once | ORAL | Status: DC | PRN

## 2022-01-06 MED ORDER — CEFAZOLIN SODIUM-DEXTROSE 2-4 GM/100ML-% IV SOLN
2.0000 g | INTRAVENOUS | Status: AC
  Administered 2022-01-06: 2 g via INTRAVENOUS
  Filled 2022-01-06: qty 100

## 2022-01-06 MED ORDER — LABETALOL HCL 5 MG/ML IV SOLN: 10.0000 mg | INTRAVENOUS | Status: DC | PRN

## 2022-01-06 MED ORDER — MIDAZOLAM HCL 2 MG/2ML IJ SOLN: 0.5000 mg | Freq: Once | INTRAMUSCULAR | Status: DC | PRN

## 2022-01-06 MED ORDER — LABETALOL HCL 5 MG/ML IV SOLN
INTRAVENOUS | Status: DC | PRN
  Administered 2022-01-06: 5 mg via INTRAVENOUS
  Administered 2022-01-06: 10 mg via INTRAVENOUS

## 2022-01-06 MED ORDER — OXYCODONE HCL 5 MG/5ML PO SOLN: 5.0000 mg | Freq: Once | ORAL | Status: DC | PRN

## 2022-01-06 MED ORDER — PANTOPRAZOLE SODIUM 40 MG PO TBEC
40.0000 mg | DELAYED_RELEASE_TABLET | Freq: Every day | ORAL | Status: DC
  Administered 2022-01-06 – 2022-01-13 (×8): 40 mg via ORAL
  Filled 2022-01-06 (×8): qty 1

## 2022-01-06 MED ORDER — PHENYLEPHRINE HCL-NACL 20-0.9 MG/250ML-% IV SOLN
INTRAVENOUS | Status: DC | PRN
  Administered 2022-01-06: 40 ug/min via INTRAVENOUS

## 2022-01-06 MED ORDER — CHLORHEXIDINE GLUCONATE CLOTH 2 % EX PADS
6.0000 | MEDICATED_PAD | Freq: Once | CUTANEOUS | Status: AC
  Administered 2022-01-06: 6 via TOPICAL

## 2022-01-06 MED ORDER — ORAL CARE MOUTH RINSE: 15.0000 mL | Freq: Once | OROMUCOSAL | Status: AC

## 2022-01-06 MED ORDER — LIDOCAINE-EPINEPHRINE 1 %-1:100000 IJ SOLN
INTRAMUSCULAR | Status: DC | PRN
  Administered 2022-01-06: 10 mL via INTRADERMAL

## 2022-01-06 MED ORDER — DEXAMETHASONE SODIUM PHOSPHATE 4 MG/ML IJ SOLN
4.0000 mg | Freq: Four times a day (QID) | INTRAMUSCULAR | Status: AC
  Administered 2022-01-07 – 2022-01-08 (×4): 4 mg via INTRAVENOUS
  Filled 2022-01-06 (×4): qty 1

## 2022-01-06 MED ORDER — CEFAZOLIN SODIUM-DEXTROSE 1-4 GM/50ML-% IV SOLN
1.0000 g | Freq: Three times a day (TID) | INTRAVENOUS | Status: AC
  Administered 2022-01-06 – 2022-01-07 (×2): 1 g via INTRAVENOUS
  Filled 2022-01-06 (×2): qty 50

## 2022-01-06 MED ORDER — SUGAMMADEX SODIUM 200 MG/2ML IV SOLN
INTRAVENOUS | Status: DC | PRN
  Administered 2022-01-06: 200 mg via INTRAVENOUS

## 2022-01-06 MED ORDER — PANTOPRAZOLE SODIUM 40 MG IV SOLR: 40.0000 mg | Freq: Every day | INTRAVENOUS | Status: DC

## 2022-01-06 MED ORDER — LEVETIRACETAM IN NACL 500 MG/100ML IV SOLN
500.0000 mg | Freq: Two times a day (BID) | INTRAVENOUS | Status: DC
  Administered 2022-01-06 – 2022-01-07 (×2): 500 mg via INTRAVENOUS
  Filled 2022-01-06 (×2): qty 100

## 2022-01-06 MED ORDER — THROMBIN 20000 UNITS EX SOLR
CUTANEOUS | Status: DC | PRN
  Administered 2022-01-06: 20 mL via TOPICAL

## 2022-01-06 MED ORDER — THROMBIN 5000 UNITS EX SOLR
OROMUCOSAL | Status: DC | PRN
  Administered 2022-01-06: 5 mL via TOPICAL

## 2022-01-06 MED ORDER — PHENYLEPHRINE 80 MCG/ML (10ML) SYRINGE FOR IV PUSH (FOR BLOOD PRESSURE SUPPORT)
PREFILLED_SYRINGE | INTRAVENOUS | Status: AC
  Filled 2022-01-06: qty 10

## 2022-01-06 MED ORDER — DEXAMETHASONE SODIUM PHOSPHATE 4 MG/ML IJ SOLN
4.0000 mg | Freq: Three times a day (TID) | INTRAMUSCULAR | Status: DC
  Administered 2022-01-08 – 2022-01-09 (×2): 4 mg via INTRAVENOUS
  Filled 2022-01-06 (×2): qty 1

## 2022-01-06 MED ORDER — PROMETHAZINE HCL 25 MG PO TABS: 12.5000 mg | ORAL_TABLET | ORAL | Status: DC | PRN

## 2022-01-06 MED ORDER — LIDOCAINE 2% (20 MG/ML) 5 ML SYRINGE
INTRAMUSCULAR | Status: AC
  Filled 2022-01-06: qty 5

## 2022-01-06 MED ORDER — DEXAMETHASONE SODIUM PHOSPHATE 10 MG/ML IJ SOLN
INTRAMUSCULAR | Status: AC
  Filled 2022-01-06: qty 1

## 2022-01-06 MED ORDER — ACETAMINOPHEN 10 MG/ML IV SOLN
INTRAVENOUS | Status: AC
  Filled 2022-01-06: qty 100

## 2022-01-06 MED ORDER — EPHEDRINE SULFATE-NACL 50-0.9 MG/10ML-% IV SOSY
PREFILLED_SYRINGE | INTRAVENOUS | Status: DC | PRN
  Administered 2022-01-06 (×2): 5 mg via INTRAVENOUS

## 2022-01-06 MED ORDER — PROPOFOL 10 MG/ML IV BOLUS
INTRAVENOUS | Status: DC | PRN
  Administered 2022-01-06: 30 mg via INTRAVENOUS
  Administered 2022-01-06: 120 mg via INTRAVENOUS

## 2022-01-06 MED ORDER — FENTANYL CITRATE (PF) 100 MCG/2ML IJ SOLN: 25.0000 ug | INTRAMUSCULAR | Status: DC | PRN

## 2022-01-06 MED ORDER — ACETAMINOPHEN 10 MG/ML IV SOLN
INTRAVENOUS | Status: DC | PRN
  Administered 2022-01-06: 1000 mg via INTRAVENOUS

## 2022-01-06 MED ORDER — LIDOCAINE 2% (20 MG/ML) 5 ML SYRINGE
INTRAMUSCULAR | Status: DC | PRN
  Administered 2022-01-06: 40 mg via INTRAVENOUS

## 2022-01-06 MED ORDER — FENTANYL CITRATE (PF) 250 MCG/5ML IJ SOLN
INTRAMUSCULAR | Status: AC
  Filled 2022-01-06: qty 5

## 2022-01-06 MED ORDER — ACETAMINOPHEN 650 MG RE SUPP: 650.0000 mg | RECTAL | Status: DC | PRN

## 2022-01-06 MED ORDER — THROMBIN 5000 UNITS EX SOLR
CUTANEOUS | Status: AC
Start: 2022-01-06 — End: ?
  Filled 2022-01-06: qty 5000

## 2022-01-06 MED ORDER — DEXAMETHASONE SODIUM PHOSPHATE 10 MG/ML IJ SOLN
INTRAMUSCULAR | Status: DC | PRN
  Administered 2022-01-06: 10 mg via INTRAVENOUS

## 2022-01-06 MED ORDER — CHLORHEXIDINE GLUCONATE 0.12 % MT SOLN
OROMUCOSAL | Status: AC
  Administered 2022-01-06: 15 mL via OROMUCOSAL
  Filled 2022-01-06: qty 15

## 2022-01-06 MED ORDER — CHLORHEXIDINE GLUCONATE 0.12 % MT SOLN: 15.0000 mL | Freq: Once | OROMUCOSAL | Status: AC

## 2022-01-06 MED ORDER — MEPERIDINE HCL 25 MG/ML IJ SOLN: 6.2500 mg | INTRAMUSCULAR | Status: DC | PRN

## 2022-01-06 MED ORDER — ACETAMINOPHEN 325 MG PO TABS
650.0000 mg | ORAL_TABLET | ORAL | Status: DC | PRN
  Administered 2022-01-06 – 2022-01-12 (×10): 650 mg via ORAL
  Filled 2022-01-06 (×12): qty 2

## 2022-01-06 MED ORDER — LIDOCAINE-EPINEPHRINE 1 %-1:100000 IJ SOLN
INTRAMUSCULAR | Status: AC
Start: 2022-01-06 — End: ?
  Filled 2022-01-06: qty 1

## 2022-01-06 MED ORDER — ONDANSETRON HCL 4 MG/2ML IJ SOLN: 4.0000 mg | INTRAMUSCULAR | Status: DC | PRN

## 2022-01-06 MED ORDER — 0.9 % SODIUM CHLORIDE (POUR BTL) OPTIME
TOPICAL | Status: DC | PRN
  Administered 2022-01-06 (×2): 1000 mL

## 2022-01-06 MED ORDER — THROMBIN 20000 UNITS EX SOLR
CUTANEOUS | Status: AC
Start: 2022-01-06 — End: ?
  Filled 2022-01-06: qty 20000

## 2022-01-06 MED ORDER — CHLORHEXIDINE GLUCONATE CLOTH 2 % EX PADS
6.0000 | MEDICATED_PAD | Freq: Every day | CUTANEOUS | Status: DC
Start: 2022-01-07 — End: 2022-01-11
  Administered 2022-01-07 – 2022-01-09 (×3): 6 via TOPICAL

## 2022-01-06 MED ORDER — ROCURONIUM BROMIDE 10 MG/ML (PF) SYRINGE
PREFILLED_SYRINGE | INTRAVENOUS | Status: AC
  Filled 2022-01-06: qty 10

## 2022-01-06 MED ORDER — DEXAMETHASONE SODIUM PHOSPHATE 10 MG/ML IJ SOLN
6.0000 mg | Freq: Four times a day (QID) | INTRAMUSCULAR | Status: AC
  Administered 2022-01-06 – 2022-01-07 (×4): 6 mg via INTRAVENOUS
  Filled 2022-01-06 (×4): qty 1

## 2022-01-06 MED ORDER — ROCURONIUM BROMIDE 10 MG/ML (PF) SYRINGE
PREFILLED_SYRINGE | INTRAVENOUS | Status: DC | PRN
  Administered 2022-01-06: 60 mg via INTRAVENOUS

## 2022-01-06 MED ORDER — PROPOFOL 10 MG/ML IV BOLUS
INTRAVENOUS | Status: AC
  Filled 2022-01-06: qty 20

## 2022-01-06 MED ORDER — CHLORHEXIDINE GLUCONATE CLOTH 2 % EX PADS
6.0000 | MEDICATED_PAD | Freq: Every day | CUTANEOUS | Status: DC
  Administered 2022-01-06: 6 via TOPICAL

## 2022-01-06 MED ORDER — FENTANYL CITRATE (PF) 250 MCG/5ML IJ SOLN
INTRAMUSCULAR | Status: DC | PRN
Start: 2022-01-06 — End: 2022-01-06
  Administered 2022-01-06: 250 ug via INTRAVENOUS

## 2022-01-06 MED ORDER — ONDANSETRON HCL 4 MG/2ML IJ SOLN
INTRAMUSCULAR | Status: DC | PRN
  Administered 2022-01-06: 4 mg via INTRAVENOUS

## 2022-01-06 MED ORDER — BACITRACIN ZINC 500 UNIT/GM EX OINT
TOPICAL_OINTMENT | CUTANEOUS | Status: DC | PRN
  Administered 2022-01-06: 1 via TOPICAL

## 2022-01-06 MED ORDER — ONDANSETRON HCL 4 MG/2ML IJ SOLN
INTRAMUSCULAR | Status: AC
  Filled 2022-01-06: qty 2

## 2022-01-06 MED ORDER — POTASSIUM CHLORIDE IN NACL 20-0.9 MEQ/L-% IV SOLN
INTRAVENOUS | Status: DC
  Filled 2022-01-06: qty 1000

## 2022-01-06 SURGICAL SUPPLY — 57 items
BAG COUNTER SPONGE SURGICOUNT (BAG) ×3 IMPLANT
BAG SPNG CNTER NS LX DISP (BAG) ×2
BAND INSRT 18 STRL LF DISP RB (MISCELLANEOUS)
BAND RUBBER #18 3X1/16 STRL (MISCELLANEOUS) IMPLANT
BUR SPIRAL ROUTER 2.3 (BUR) ×2 IMPLANT
CANISTER SUCT 3000ML PPV (MISCELLANEOUS) ×3 IMPLANT
CLIP VESOCCLUDE MED 6/CT (CLIP) IMPLANT
DRAPE MICROSCOPE LEICA (MISCELLANEOUS) IMPLANT
DRAPE NEUROLOGICAL W/INCISE (DRAPES) ×2 IMPLANT
DRAPE SURG 17X23 STRL (DRAPES) IMPLANT
DRAPE WARM FLUID 44X44 (DRAPES) ×2 IMPLANT
DURAPREP 6ML APPLICATOR 50/CS (WOUND CARE) ×2 IMPLANT
ELECT REM PT RETURN 9FT ADLT (ELECTROSURGICAL) ×2
ELECTRODE REM PT RTRN 9FT ADLT (ELECTROSURGICAL) ×1 IMPLANT
EVACUATOR 1/8 PVC DRAIN (DRAIN) IMPLANT
GAUZE 4X4 16PLY ~~LOC~~+RFID DBL (SPONGE) IMPLANT
GAUZE SPONGE 4X4 12PLY STRL (GAUZE/BANDAGES/DRESSINGS) ×2 IMPLANT
GLOVE BIO SURGEON STRL SZ 6.5 (GLOVE) ×1 IMPLANT
GLOVE BIO SURGEON STRL SZ7 (GLOVE) ×2 IMPLANT
GLOVE BIO SURGEON STRL SZ8 (GLOVE) ×2 IMPLANT
GLOVE BIOGEL PI IND STRL 7.0 (GLOVE) IMPLANT
GLOVE BIOGEL PI INDICATOR 7.0 (GLOVE) ×1
GOWN STRL REUS W/ TWL LRG LVL3 (GOWN DISPOSABLE) IMPLANT
GOWN STRL REUS W/ TWL XL LVL3 (GOWN DISPOSABLE) IMPLANT
GOWN STRL REUS W/TWL 2XL LVL3 (GOWN DISPOSABLE) IMPLANT
GOWN STRL REUS W/TWL LRG LVL3 (GOWN DISPOSABLE) ×4
GOWN STRL REUS W/TWL XL LVL3 (GOWN DISPOSABLE)
HEMOSTAT POWDER KIT SURGIFOAM (HEMOSTASIS) ×1 IMPLANT
KIT BASIN OR (CUSTOM PROCEDURE TRAY) ×2 IMPLANT
KIT TURNOVER KIT B (KITS) ×2 IMPLANT
NEEDLE HYPO 22GX1.5 SAFETY (NEEDLE) ×2 IMPLANT
NS IRRIG 1000ML POUR BTL (IV SOLUTION) ×2 IMPLANT
PACK BATTERY CMF DISP FOR DVR (ORTHOPEDIC DISPOSABLE SUPPLIES) ×1 IMPLANT
PACK CRANIOTOMY CUSTOM (CUSTOM PROCEDURE TRAY) ×2 IMPLANT
PAD ARMBOARD 7.5X6 YLW CONV (MISCELLANEOUS) ×2 IMPLANT
PATTIES SURGICAL .25X.25 (GAUZE/BANDAGES/DRESSINGS) IMPLANT
PATTIES SURGICAL .5 X.5 (GAUZE/BANDAGES/DRESSINGS) IMPLANT
PATTIES SURGICAL .5 X3 (DISPOSABLE) IMPLANT
PATTIES SURGICAL 1X1 (DISPOSABLE) IMPLANT
PERFORATOR LRG  14-11MM (BIT) ×2
PERFORATOR LRG 14-11MM (BIT) ×1 IMPLANT
PIN MAYFIELD SKULL DISP (PIN) ×1 IMPLANT
PLATE BONE 12 2H TARGET XL (Plate) ×4 IMPLANT
SCREW UNIII AXS SD 1.5X4 (Screw) ×8 IMPLANT
SPONGE NEURO XRAY DETECT 1X3 (DISPOSABLE) IMPLANT
SPONGE SURGIFOAM ABS GEL 100 (HEMOSTASIS) ×2 IMPLANT
STAPLER VISISTAT 35W (STAPLE) ×2 IMPLANT
SUT ETHILON 3 0 FSL (SUTURE) IMPLANT
SUT NURALON 4 0 TR CR/8 (SUTURE) ×4 IMPLANT
SUT VIC AB 2-0 CP2 18 (SUTURE) ×3 IMPLANT
SYR CONTROL 10ML LL (SYRINGE) ×2 IMPLANT
TAPE CLOTH SURG 4X10 WHT LF (GAUZE/BANDAGES/DRESSINGS) ×1 IMPLANT
TOWEL GREEN STERILE (TOWEL DISPOSABLE) ×2 IMPLANT
TOWEL GREEN STERILE FF (TOWEL DISPOSABLE) ×2 IMPLANT
TRAY FOLEY MTR SLVR 16FR STAT (SET/KITS/TRAYS/PACK) IMPLANT
UNDERPAD 30X36 HEAVY ABSORB (UNDERPADS AND DIAPERS) IMPLANT
WATER STERILE IRR 1000ML POUR (IV SOLUTION) ×2 IMPLANT

## 2022-01-06 NOTE — Anesthesia Procedure Notes (Signed)
Procedure Name: Intubation Date/Time: 01/06/2022 3:15 PM  Performed by: Reece Agar, CRNAPre-anesthesia Checklist: Patient identified, Emergency Drugs available, Suction available and Patient being monitored Patient Re-evaluated:Patient Re-evaluated prior to induction Oxygen Delivery Method: Circle System Utilized Preoxygenation: Pre-oxygenation with 100% oxygen Induction Type: IV induction Ventilation: Mask ventilation without difficulty and Oral airway inserted - appropriate to patient size Laryngoscope Size: Mac and 3 Grade View: Grade I Tube type: Oral Tube size: 7.5 mm Number of attempts: 1 Airway Equipment and Method: Stylet and Oral airway Placement Confirmation: ETT inserted through vocal cords under direct vision, positive ETCO2 and breath sounds checked- equal and bilateral Secured at: 22 cm Tube secured with: Tape Dental Injury: Teeth and Oropharynx as per pre-operative assessment

## 2022-01-06 NOTE — Op Note (Signed)
01/06/2022  4:34 PM  PATIENT:  Dale Edwards  66 y.o. male  PRE-OPERATIVE DIAGNOSIS: Right chronic subdural hematoma  POST-OPERATIVE DIAGNOSIS:  same  PROCEDURE: Right craniotomy for evacuation of subdural hematoma  SURGEON:  Marikay Alar, MD  ASSISTANTS: Verlin Dike FNP  ANESTHESIA:   General  EBL: 50 ml  Total I/O In: 200 [IV Piggyback:200] Out: 500 [Urine:400; Blood:100]  BLOOD ADMINISTERED: none  DRAINS: 10 flat fluted JP subdural drain  SPECIMEN:  none  INDICATION FOR PROCEDURE: This patient presented with a incidentally found chronic subdural hematoma found after a fall in the emergency department large. Imaging showed3 a large right chronic subdural hematoma with mass effect and shift. The patient tried conservative measures without relief. Pain was debilitating. Recommended right craniotomy for subdural hematoma. Patient understood the risks, benefits, and alternatives and potential outcomes and wished to proceed.  PROCEDURE DETAILS: The patient was taken to the operating room and after induction of adequate generalized endotracheal anesthesia, the head was affixed in a 3 point Mayfield head rest, and turned to the left to expose the right frontotemporal parietal region. The head was shaved and then cleaned and then prepped with DuraPrep and draped in the usual sterile fashion. 10 cc of local anesthetic was injected, and a curvilinear incision was made on the right of the head. Raney clips were placed to establish hemostasis of the scalp, the muscle was reflected with the scalp flap, to expose the right temporoparietal region. A burr hole was placed, and a craniotomy flap was turned utilizing the high-speed, air powered drill. The flap was then placed in bacitracin-containing saline solution, and the dura was opened to expose a large right chronic subdural hematoma. A hematoma was then removed with a combination of irrigation and suction. I continued to irrigate until the  irrigant was clear to, and dried any bleeding with bipolar cautery.  We opened a thick membrane over the surface of the brain and fenestrated it in all directions.  I then placed a subdural drain through separate stab incision and close the dura with a running 4-0 Nurolon suture. Dural tack up sutures were placed. The dura was lined with Gelfoam, and the craniotomy flap was replaced with doggie-bone plates. The wound was copiously irrigated. A subgaleal drain was placed, and the galea was then closed with interrupted 2-0 Vicryl suture. The skin was then closed with staples a sterile dressing was applied. The patient was then taken out of the 3-point Mayfield headrest and awakened from general anesthesia, and transported to the recovery room in stable condition. At the end of the procedure all sponge, needle, and instrument counts were correct.    PLAN OF CARE: Admit to inpatient   PATIENT DISPOSITION:  PACU - hemodynamically stable.   Delay start of Pharmacological VTE agent (>24hrs) due to surgical blood loss or risk of bleeding:  yes

## 2022-01-06 NOTE — Progress Notes (Signed)
Pt seen and examined, no change, all questions answered..he understands risk include but are not limited to bleeding, infection, recurrent SDH, incomplete removal, need for further surgery, stroke, numbness, weakness, paralysis, loss of vision, seizure, lack of relief, worsening and anesthesia risks including DVT, pneumonia and death. He agrees to proceed.

## 2022-01-06 NOTE — Progress Notes (Signed)
PROGRESS NOTE   Dale Edwards  WVP:[id number] DOB: July 01, 1956 DOA: 01/02/2022 PCP: Oneita Hurt, No  Brief Narrative:   66 year old home dwell W male Prior EtOH, tobacco, homelessness-admission 07/12/2021 with fall and laceration to left forehead at the time found to have aspiration bone lucencies aspiration pneumonia hypoxic respiratory failure--hospitalization showed no evidence of cancer-IR was consulted but biopsies of lucencies could not be done and patient was to follow-up with Dr. Pamelia Hoit He also had prolonged QTc Previously followed by Dr. Debarah Crape neurology for MS diagnosed in 2014?-Started on Tecfidera 02/2013 complicated by JC virus being positive and apparently was lost to follow-up secondary to transport issues homelessness until the last office visit 04/10/2021 Was seen in ED 12/27/2021 with a headache?  Fall which-x-rays were performed and patient was discharged  Patient returned to ED 6/7 as he had fallen [place] the [place] landing on his buttocks-denied any head injury or loss of consciousness has some chronic buttock wounds--he was about to be discharged   patient fell at the emergency room around 651 while trying to stand on his own which was unwitnessed CT scan showed new subdural hematoma with midline shift to the left compared to prior 2023 patient was transported from [facility]  CT showed a large right holohemispheric chronic SDH with small left SDH leftward midline shift measuring 8 mm patient was admitted by medicine to a progressive bed  Hospital-Problem based course   Subdural hemorrhage 8 mm shift Neurosurgery 6/11 performing definitive surgery--rest as per neurosurgery Fall Imaging was negative other than CT 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 Sonny Masters need  further discussion in the outpatient setting regarding planning Will need outpatient MRI  DVT prophylaxis: SCD at this time Code Status: Full CODE STATUS Family Communication: No family is present or available at the bedside He states that his Sister Selena Edwards is related to him but we do not have her number Disposition:  Status is: Observation The patient will require care spanning > 2 midnights and should be moved to inpatient because:   Will probably require surgery   Consultants:  Neurosurgery  Procedures: None currently  Antimicrobials: None   Subjective:  N.p.o. going for procedure-asking about who is doing it  Objective: Vitals:   01/06/22 0048 01/06/22 0357 01/06/22 0456 01/06/22 1036  BP: 109/64 102/75  93/65  Pulse: 62 69  70  Resp: 17 17    Temp: 97.7 F (36.5 C) 98 F (36.7 C)  98 F (36.7 C)  TempSrc: Oral   Oral  SpO2: 97% 97%  97%  Weight:   68.8 kg   Height:        Intake/Output Summary (Last 24 hours) at 01/06/2022 1117 Last data filed at 01/06/2022 0400 Gross per 24 hour  Intake --  Output 1150 ml  Net -1150 ml    Filed Weights   01/04/22 0500 01/05/22 0411 01/06/22 0456  Weight: 68.9 kg 69.2 kg 68.8 kg    Examination:  EOMI NCAT no focal deficit Moving 4 limbs equally without deficit-power 55/--mentation intact Chest clear Abdomen soft no rebound no guarding  Data Reviewed: personally reviewed   CBC    Component Value Date/Time   WBC 6.3 01/03/2022 0537   RBC 4.66 01/03/2022 1809   RBC 4.58 01/03/2022 0537   HGB 15.2 01/03/2022 0537   HGB 17.1 09/26/2020  1422   HCT 44.2 01/03/2022 0537   HCT 51.4 (H) 09/26/2020 1422   PLT 285 01/03/2022 0537   PLT 236 09/26/2020 1422   MCV 96.5 01/03/2022 0537   MCV 96 09/26/2020 1422   MCH 33.2 01/03/2022 0537   MCHC 34.4 01/03/2022 0537   RDW 13.4 01/03/2022 0537   RDW 12.6 09/26/2020 1422   LYMPHSABS 1.7 01/03/2022 0537   LYMPHSABS 2.4 09/26/2020 1422   MONOABS 0.6 01/03/2022 0537    EOSABS 0.3 01/03/2022 0537   EOSABS 0.3 09/26/2020 1422   BASOSABS 0.1 01/03/2022 0537   BASOSABS 0.1 09/26/2020 1422      Latest Ref Rng & Units 01/04/2022    1:59 AM 01/03/2022    5:37 AM 11/26/2021   12:08 AM  CMP  Glucose 70 - 99 mg/dL 299  242  79   BUN 8 - 23 mg/dL 19  15  11    Creatinine 0.61 - 1.24 mg/dL  6.83  4.19   Sodium 135 - 145 mmol/L 137  136  137   Potassium 3.5 - 5.1 mmol/L 4.0  3.5  3.8   Chloride 98 - 111 mmol/L 107  105  105   CO2 22 - 32 mmol/L 26  24  21    Calcium 8.9 - 10.3 mg/dL 8.3  8.3  8.8   Total Protein 6.5 - 8.1 g/dL  5.2  6.2   Total Bilirubin 0.3 - 1.2 mg/dL  0.7  0.7   Alkaline Phos 38 - 126 U/L  53  62   AST 15 - 41 U/L  16  18   ALT 0 - 44 U/L  20  16      Radiology Studies: CT HEAD WO CONTRAST (6.22)  Result Date: 01/05/2022 CLINICAL DATA:  Follow-up subdural hematoma EXAM: CT HEAD WITHOUT CONTRAST TECHNIQUE: Contiguous axial images were obtained from the base of the skull through the vertex without intravenous contrast. RADIATION DOSE REDUCTION: This exam was performed according to the departmental dose-optimization program which includes automated exposure control, adjustment of the mA and/or kV according to patient size and/or use of iterative reconstruction technique. COMPARISON:  3 days ago FINDINGS: Brain: Low but not CSF density subdural collection with signs of septation along the right cerebral convexity, up to 3 cm in thickness with prominent right cerebral mass effect. Midline shift measures up to 8 mm. No entrapment. Trace and more low-density subdural collection along the left cerebral convexity. Chronic white matter disease. No evidence of acute infarct. Vascular: No hyperdense vessel or unexpected calcification. Skull: Normal. Negative for fracture or focal lesion. Sinuses/Orbits: No acute finding. Chronic partial mastoid opacification. IMPRESSION: 1. Unchanged nonacute subdural hematoma along the right cerebral convexity with  septations. Thickness measures up to 3 cm and there is 8 mm of midline shift. 2. Trace and more simple subdural collection along the left cerebral convexity. Electronically Signed   By: M.D.   On: 01/05/2022 08:13     Scheduled Meds:  Chlorhexidine Gluconate Cloth  6 each Topical Q0600   feeding supplement  237 mL Oral BID BM   folic acid  1 mg Oral Daily   Gerhardt's butt cream   Topical BID   multivitamin with minerals  1 tablet Oral Daily   mupirocin ointment  1 application  Nasal BID   thiamine  100 mg Oral Daily   Or   thiamine  100 mg Intravenous Daily   Continuous Infusions:   ceFAZolin (ANCEF) IV  LOS: 3 days   Time spent: 10  Rhetta Mura, MD [facility] To contact the attending provider between 7A-7P or the covering provider during after hours 7P-7A, please log into the web site [email or web address] and access using universal [place] password for that web site. If you do not have the password, please call the hospital operator.  01/06/2022, 11:17 AM

## 2022-01-06 NOTE — OR Nursing (Signed)
External urinary catheter removed to place foley catheter for Craniotomy. Upon insertion of the catheter resistance was met and foley was unable to be inserted. Dr. Jones was informed and he stated to leave it out and replace the external urinary catheter.  

## 2022-01-06 NOTE — Anesthesia Postprocedure Evaluation (Signed)
Anesthesia Post Note  Patient: Dale Edwards  Procedure(s) Performed: Right Craniotomy for Subdural Hematoma (Right: Head)     Patient location during evaluation: ICU Anesthesia Type: General Level of consciousness: awake and alert and patient cooperative Pain management: pain level controlled Vital Signs Assessment: post-procedure vital signs reviewed and stable Respiratory status: spontaneous breathing, nonlabored ventilation, respiratory function stable and patient connected to nasal cannula oxygen Cardiovascular status: stable and blood pressure returned to baseline Postop Assessment: no apparent nausea or vomiting Anesthetic complications: no   No notable events documented.  Last Vitals:  Vitals:   01/06/22 1652 01/06/22 1702  BP: 103/71 (!) 100/57  Pulse: 67 66  Resp: 19 15  Temp: (!) 36.2 C   SpO2: 99% 98%    Last Pain:  Vitals:   01/06/22 1652  TempSrc:   PainSc: 8                  Bethanee Redondo,E. Keyonda Bickle

## 2022-01-06 NOTE — Progress Notes (Signed)
Patient arrived to 4N17. Bedside report was received. Patients JP drain was noted to be full and uncharged. JP was emptied, charged and secured. Will continue to monitor.

## 2022-01-06 NOTE — Anesthesia Procedure Notes (Signed)
Arterial Line Insertion Start/End6/05/2022 2:20 PM, 01/06/2022 2:30 PM Performed by: Macie Burows, CRNA, CRNA  Patient location: Pre-op. Preanesthetic checklist: patient identified, IV checked, site marked, risks and benefits discussed, surgical consent, monitors and equipment checked, pre-op evaluation, timeout performed and anesthesia consent Right, radial was placed Catheter size: 20 G Hand hygiene performed , maximum sterile barriers used  and Seldinger technique used Allen's test indicative of satisfactory collateral circulation Attempts: 1 Procedure performed without using ultrasound guided technique. Following insertion, dressing applied and Biopatch. Post procedure assessment: normal and unchanged  Patient tolerated the procedure well with no immediate complications.

## 2022-01-06 NOTE — Progress Notes (Signed)
No new issues or problems.  Plan for bur hole evacuation of subdural per Dr. Yetta Barre later today.

## 2022-01-06 NOTE — Progress Notes (Signed)
Notified Neurosurgery of J.P. increased drain output.   5:40pm 7:00pm  Per Neurosurgery, not concerning now. Continue to monitor output. Call back for any neuro changes. Will follow care plan.   Jannifer Hick, RN

## 2022-01-06 NOTE — Transfer of Care (Signed)
Immediate Anesthesia Transfer of Care Note  Patient: Dale Edwards  Procedure(s) Performed: Right Craniotomy for Subdural Hematoma (Right: Head)  Patient Location: PACU  Anesthesia Type:General  Level of Consciousness: awake and alert   Airway & Oxygen Therapy: Patient Spontanous Breathing and Patient connected to face mask oxygen  Post-op Assessment: Report given to RN and Post -op Vital signs reviewed and stable  Post vital signs: Reviewed and stable  Last Vitals:  Vitals Value Taken Time  BP 103/71 01/06/22 1652  Temp    Pulse 66 01/06/22 1650  Resp 18 01/06/22 1650  SpO2 99 % 01/06/22 1650  Vitals shown include unvalidated device data.  Last Pain:  Vitals:   01/06/22 1353  TempSrc: Oral  PainSc: 7          Complications: No notable events documented.

## 2022-01-06 NOTE — Progress Notes (Signed)
Transported patient to CT to obtain post-op CT head.

## 2022-01-07 ENCOUNTER — Encounter (HOSPITAL_COMMUNITY): Payer: Self-pay | Admitting: Neurological Surgery

## 2022-01-07 ENCOUNTER — Inpatient Hospital Stay (HOSPITAL_COMMUNITY): Payer: Medicare Other

## 2022-01-07 LAB — CBC
HCT: 41.3 % (ref 39.0–52.0)
Hemoglobin: 14.5 g/dL (ref 13.0–17.0)
MCH: 33.3 pg (ref 26.0–34.0)
MCHC: 35.1 g/dL (ref 30.0–36.0)
MCV: 94.7 fL (ref 80.0–100.0)
Platelets: 286 10*3/uL (ref 150–400)
RBC: 4.36 MIL/uL (ref 4.22–5.81)
RDW: 12.9 % (ref 11.5–15.5)
WBC: 20.3 10*3/uL — ABNORMAL HIGH (ref 4.0–10.5)
nRBC: 0 % (ref 0.0–0.2)

## 2022-01-07 LAB — BASIC METABOLIC PANEL
Anion gap: 6 (ref 5–15)
BUN: 14 mg/dL (ref 8–23)
CO2: 21 mmol/L — ABNORMAL LOW (ref 22–32)
Calcium: 8.4 mg/dL — ABNORMAL LOW (ref 8.9–10.3)
Chloride: 108 mmol/L (ref 98–111)
Creatinine, Ser: 0.76 mg/dL (ref 0.61–1.24)
GFR, Estimated: >60 mL/min (ref >60)
Glucose, Bld: 174 mg/dL — ABNORMAL HIGH (ref 70–99)
Potassium: 4.6 mmol/L (ref 3.5–5.1)
Sodium: 135 mmol/L (ref 135–145)

## 2022-01-07 LAB — VITAMIN B6: Vitamin B6: 9.2 ug/L (ref 3.4–65.2)

## 2022-01-07 MED ORDER — LEVETIRACETAM 500 MG PO TABS
500.0000 mg | ORAL_TABLET | Freq: Two times a day (BID) | ORAL | Status: DC
  Administered 2022-01-07 – 2022-01-14 (×14): 500 mg via ORAL
  Filled 2022-01-07 (×14): qty 1

## 2022-01-07 MED ORDER — LACTATED RINGERS IV SOLN: INTRAVENOUS | Status: DC

## 2022-01-07 NOTE — Progress Notes (Signed)
Subjective: Patient reports headaches are much better today   Objective: Vital signs in last 24 hours: Temp:  [96.9 F (36.1 C)-98.3 F (36.8 C)] 98.1 F (36.7 C) (06/12 0400) Pulse Rate:  [51-71] 51 (06/12 0600) Resp:  [12-23] 19 (06/12 0600) BP: (85-113)/(41-76) 106/61 (06/12 0600) SpO2:  [92 %-99 %] 93 % (06/12 0600) Arterial Line BP: (90-137)/(45-73) 137/60 (06/12 0600) Weight:  [70.1 kg] 70.1 kg (06/12 0500)  Intake/Output from previous day: 06/11 0701 - 06/12 0700 In: 2218.6 [I.V.:1868.6; IV Piggyback:350] Out: 1570 [Urine:1025; Drains:445; Blood:100] Intake/Output this shift: Total I/O In: 899.9 [I.V.:749.9; IV Piggyback:150] Out: [id number] [Urine:625; Drains:245]  Neurologic: Grossly normal  Lab Results: Lab Results  Component Value Date   WBC 6.3 01/03/2022   HGB 15.2 01/03/2022   HCT 44.2 01/03/2022   MCV 96.5 01/03/2022   PLT 285 01/03/2022   Lab Results  Component Value Date   INR 1.1 01/02/2022   BMET Lab Results  Component Value Date   NA 137 01/04/2022   K 4.0 01/04/2022   CL 107 01/04/2022   CO2 26 01/04/2022   GLUCOSE 116 (H) 01/04/2022   BUN 19 01/04/2022   CREATININE 0.87 01/04/2022   CALCIUM 8.3 (L) 01/04/2022    Studies/Results: CT HEAD WO CONTRAST  Result Date: 01/07/2022 CLINICAL DATA:  Subdural hematoma EXAM: CT HEAD WITHOUT CONTRAST TECHNIQUE: Contiguous axial images were obtained from the base of the skull through the vertex without intravenous contrast. RADIATION DOSE REDUCTION: This exam was performed according to the departmental dose-optimization program which includes automated exposure control, adjustment of the mA and/or kV according to patient size and/or use of iterative reconstruction technique. COMPARISON:  01/06/2022 FINDINGS: Brain: Mixed density subdural hematoma on the right with prior evacuation and drain placement. Increased thickness measured at 18 mm along the right parietal convexity, previously approximately 10 mm.  Increased mass effect on the right frontal parietal cortex. Mixed density subdural hematoma along the left cerebral convexity with high-density component measuring up to 16 mm in thickness, previously 12 mm. Midline shift measures up to 3 mm. No evidence of complicating infarct or hydrocephalus. Brain atrophy and chronic small vessel ischemia. Vascular: No hyperdense vessel or unexpected calcification. Skull: Unremarkable craniotomy on the right Sinuses/Orbits: Partial bilateral mastoid opacification with under pneumatization on the left. IMPRESSION: 1. Increased thickness of bilateral subdural hematoma, high-density clot measuring up to 18 mm thickness on the right and 10 mm on the left. 2. 3 mm of rightward midline shift. Electronically Signed   By: Jorje Guild M.D.   On: 01/07/2022 06:03   CT HEAD WO CONTRAST (5MM)  Result Date: 01/06/2022 CLINICAL DATA:  Subdural hematoma. EXAM: CT HEAD WITHOUT CONTRAST TECHNIQUE: Contiguous axial images were obtained from the base of the skull through the vertex without intravenous contrast. RADIATION DOSE REDUCTION: This exam was performed according to the departmental dose-optimization program which includes automated exposure control, adjustment of the mA and/or kV according to patient size and/or use of iterative reconstruction technique. COMPARISON:  Head CT 01/05/2022 FINDINGS: Brain: Sequelae of interval right parietal craniotomy for subdural hematoma evacuation are identified. A subdural drain, Gel-Foam, and small volume pneumocephalus are noted. Residual mixed density subdural fluid collection/hematoma over the right cerebral convexity measures up to 1 cm in thickness. Mass effect on the right cerebral hemisphere has greatly decreased, and midline shift has resolved. A pre-existing small subdural collection over the left cerebral convexity has mildly enlarged and now contains hyperdense blood products consistent with interval hemorrhage, and this  collection now  measures up to 1.2 cm in thickness with mild mass effect on the left frontal and parietal lobes. A small amount of new/acute subdural hemorrhage is also noted along the falx measuring up to 5 mm in thickness. No acute infarct or hydrocephalus is evident. Patchy hypodensities in the cerebral white matter bilaterally are similar to the prior CT and are nonspecific but compatible with moderate chronic small vessel ischemic disease. Vascular: Calcified atherosclerosis at the skull base. No hyperdense vessel. Skull: Right parietal craniotomy.  Overlying skin staples. Sinuses/Orbits: Clear paranasal sinuses. Bilateral mastoid effusions. Unremarkable orbits. Other: None. IMPRESSION: 1. Interval right-sided subdural hematoma evacuation. Residual mixed density subdural collection as above with greatly decreased mass effect and resolved midline shift. 2. New acute hemorrhage within a pre-existing small left-sided subdural hematoma and along the falx. Only mild associated mass effect. 3. Moderate chronic small vessel ischemic disease. These results will be called to the ordering clinician or representative by the Radiologist Assistant, and communication documented in the PACS or [employer]. Electronically Signed   By: Logan Bores M.D.   On: 01/06/2022 17:53   CT HEAD WO CONTRAST (5MM)  Result Date: 01/05/2022 CLINICAL DATA:  Follow-up subdural hematoma EXAM: CT HEAD WITHOUT CONTRAST TECHNIQUE: Contiguous axial images were obtained from the base of the skull through the vertex without intravenous contrast. RADIATION DOSE REDUCTION: This exam was performed according to the departmental dose-optimization program which includes automated exposure control, adjustment of the mA and/or kV according to patient size and/or use of iterative reconstruction technique. COMPARISON:  3 days ago FINDINGS: Brain: Low but not CSF density subdural collection with signs of septation along the right cerebral convexity, up to 3 cm in  thickness with prominent right cerebral mass effect. Midline shift measures up to 8 mm. No entrapment. Trace and more low-density subdural collection along the left cerebral convexity. Chronic white matter disease. No evidence of acute infarct. Vascular: No hyperdense vessel or unexpected calcification. Skull: Normal. Negative for fracture or focal lesion. Sinuses/Orbits: No acute finding. Chronic partial mastoid opacification. IMPRESSION: 1. Unchanged nonacute subdural hematoma along the right cerebral convexity with septations. Thickness measures up to 3 cm and there is 8 mm of midline shift. 2. Trace and more simple subdural collection along the left cerebral convexity. Electronically Signed   By: Jorje Guild M.D.   On: 01/05/2022 08:13    Assessment/Plan: Postop day 1 crani for Right sided SDH. Drain put out 420mL overnight. Ct head this morning shows increase in size of bilateral subdural hematoma. The patient is asymptomatic at this time and actually is feeling much better. Will continue to monitor for now. Ambulate with therapy today   LOS: 4 days    Ocie Cornfield [facility] 01/07/2022, 6:54 AM

## 2022-01-07 NOTE — Progress Notes (Signed)
Patient seen and examined in the ICU postop day 1 after bur hole placement-states he has a headache over the area of the bandage-he is getting close monitoring is on steroids to prevent any herniation.  he will be managed by neurosurgery Dr. Yetta Barre, thank you for taking over his care  I received a call from the pharmacist about potassium being 4.6 and if I wanted potassium in the fluids-I discussed with pharmacy to change to 50 cc an hour LR until Dr. Yetta Barre and team can reevaluate with labs in the morning as patient seems to be eating  No charge  Pleas Koch, MD [facility] 2:25 PM

## 2022-01-07 NOTE — Evaluation (Signed)
Physical Therapy Evaluation Patient Details Name: Dale Edwards MRN: [id number] DOB: [birth year] Today's Date: 01/07/2022  History of Present Illness  The pt is a 66 yo male presenting 6/7 after falling in community. Pt with additional fall while in ED, striking his head on sink. Upon workup, CT revealed R larger than L subdural hematomas with mass effect and midline shift. S/p R craniotomy 6/11. PMH includes: MS, COPD, chronic alcohol and tobacco use, and homelessness.   Clinical Impression  Pt in bed upon arrival of PT, agreeable to evaluation at this time. Prior to admission the pt was independent with use of rollator in community, states he is homeless with "no plans" after he is medically safe to d/c. The pt does report recent increase in frequency of falls due to recent progression in tremors pt states are due to his MS. The pt was able to complete sit-stand transfers with minA to steady, and benefits from minA to balance with gait, especially with turning or navigating around obstacles. The pt did not have focal deficits in regards to strength, but is generally deconditioned with poor endurance. He also presents with poor insight and safety awareness which further impact his ability to move safely without assist. Given recent regression in mobility and increased frequency of falls, he would benefit from short stint rehab prior to returning to community (likely a shelter) to reduce risk of recurrent falls and fall-related injury.        Recommendations for follow up therapy are one component of a multi-disciplinary discharge planning process, led by the attending physician.  Recommendations may be updated based on patient status, additional functional criteria and insurance authorization.  Follow Up Recommendations Skilled nursing-short term rehab (<3 hours/day)    Assistance Recommended at Discharge Frequent or constant Supervision/Assistance  Patient can return home with the following  A  little help with walking and/or transfers;A little help with bathing/dressing/bathroom;Assistance with cooking/housework;Direct supervision/assist for medications management;Direct supervision/assist for financial management;Assist for transportation;Help with stairs or ramp for entrance    Equipment Recommendations None recommended by PT (pt states he cannot use WC)  Recommendations for Other Services       Functional Status Assessment Patient has had a recent decline in their functional status and demonstrates the ability to make significant improvements in function in a reasonable and predictable amount of time.     Precautions / Restrictions Precautions Precautions: Fall Precaution Comments: pt with multiple recent falls Restrictions Weight Bearing Restrictions: No      Mobility  Bed Mobility Overal bed mobility: Modified Independent             General bed mobility comments: pt completing movement to sitting EOB without assist, increased time    Transfers Overall transfer level: Needs assistance Equipment used: Rollator (4 wheels) Transfers: Sit to/from Stand Sit to Stand: Min assist, Min guard           General transfer comment: minA on first attempt, then minG with continued reps from low toilet and rollator seat    Ambulation/Gait Ambulation/Gait assistance: Min assist Gait Distance (Feet): 100 Feet (+ 100 ft) Assistive device: Rollator (4 wheels) Gait Pattern/deviations: Step-through pattern, Decreased stride length, Narrow base of support Gait velocity: decreased     General Gait Details: pt with mild drifting, reports this is close to his baseline mobility. mildly unsteady with minA to maintain balance, especially with turning. cues for proximity to rollator with turns, pt did not heed cues      Modified Rankin (Stroke  Patients Only) Modified Rankin (Stroke Patients Only) Pre-Morbid Rankin Score: Moderate disability Modified Rankin: Moderately  severe disability     Balance Overall balance assessment: History of Falls, Needs assistance Sitting-balance support: No upper extremity supported, Feet supported Sitting balance-Leahy Scale: Good     Standing balance support: Bilateral upper extremity supported, During functional activity Standing balance-Leahy Scale: Poor Standing balance comment: dependent on BUE support                             Pertinent Vitals/Pain Pain Assessment Pain Assessment: No/denies pain    Home Living Family/patient expects to be discharged to:: Shelter/Homeless                   Additional Comments: pt states he is homeless, agreeable to shelter but states he has "no plan" when prompted during session    Prior Function Prior Level of Function : Independent/Modified Independent;History of Falls (last six months)             Mobility Comments: pt states he uses rollator for al mobility, has frequent falls due to MS, tremors have gotten worse in last month ADLs Comments: pt reports independence     Hand Dominance        Extremity/Trunk Assessment   Upper Extremity Assessment Upper Extremity Assessment: RUE deficits/detail RUE Deficits / Details: baseline tremors with movement/use, pt states these have progressed over last month RUE Coordination: decreased fine motor;decreased gross motor    Lower Extremity Assessment Lower Extremity Assessment: Generalized weakness (functional to MMT, pt reports LE fatigue quickly with ambulation. baseline tremors in RLE and pt reports they have progressed in last month)    Cervical / Trunk Assessment Cervical / Trunk Assessment: Normal  Communication   Communication: No difficulties  Cognition Arousal/Alertness: Awake/alert Behavior During Therapy: Impulsive Overall Cognitive Status: Impaired/Different from baseline Area of Impairment: Safety/judgement, Awareness, Problem solving                          Safety/Judgement: Decreased awareness of safety, Decreased awareness of deficits Awareness: Intellectual Problem Solving: Difficulty sequencing, Requires verbal cues General Comments: pt needing cues for safety, impulsive and at times not listening to safety cues. difficult to discern personality vs deficit in cognition        General Comments General comments (skin integrity, edema, etc.): VSS on RA        Assessment/Plan    PT Assessment Patient needs continued PT services  PT Problem List Decreased strength;Decreased activity tolerance;Decreased balance;Decreased mobility;Decreased coordination;Decreased safety awareness       PT Treatment Interventions DME instruction;Gait training;Stair training;Functional mobility training;Therapeutic activities;Therapeutic exercise;Balance training;Patient/family education    PT Goals (Current goals can be found in the Care Plan section)  Acute Rehab PT Goals Patient Stated Goal: return to independence PT Goal Formulation: With patient Time For Goal Achievement: 01/21/22 Potential to Achieve Goals: Good    Frequency Min 4X/week        AM-PAC PT "6 Clicks" Mobility  Outcome Measure Help needed turning from your back to your side while in a flat bed without using bedrails?: None Help needed moving from lying on your back to sitting on the side of a flat bed without using bedrails?: A Little Help needed moving to and from a bed to a chair (including a wheelchair)?: A Lot Help needed standing up from a chair using your arms (e.g., wheelchair or bedside  chair)?: A Little Help needed to walk in hospital room?: A Lot Help needed climbing 3-5 steps with a railing? : A Lot 6 Click Score: 16    End of Session Equipment Utilized During Treatment: Gait belt Activity Tolerance: Patient tolerated treatment well;Patient limited by fatigue Patient left: in chair;with call bell/phone within reach;with chair alarm set Nurse Communication:  Mobility status PT Visit Diagnosis: Other abnormalities of gait and mobility (R26.89);Repeated falls (R29.6)    Time: 3888-2800 PT Time Calculation (min) (ACUTE ONLY): 34 min   Charges:   PT Evaluation $PT Eval Moderate Complexity: 1 Mod PT Treatments $Therapeutic Exercise: 8-22 mins        Vickki Muff, PT, DPT   Acute Rehabilitation Department  Ronnie Derby 01/07/2022, 6:12 PM

## 2022-01-08 LAB — ZINC: Zinc: 57 ug/dL (ref 44–115)

## 2022-01-08 MED ORDER — ASCORBIC ACID 500 MG PO TABS
1000.0000 mg | ORAL_TABLET | Freq: Every day | ORAL | Status: DC
  Administered 2022-01-08 – 2022-01-14 (×7): 1000 mg via ORAL
  Filled 2022-01-08 (×7): qty 2

## 2022-01-08 MED ORDER — VITAMIN D 25 MCG (1000 UNIT) PO TABS
1000.0000 [IU] | ORAL_TABLET | Freq: Every day | ORAL | Status: DC
  Administered 2022-01-08 – 2022-01-14 (×7): 1000 [IU] via ORAL
  Filled 2022-01-08 (×7): qty 1

## 2022-01-08 MED ORDER — VITAMIN B-12 1000 MCG PO TABS
1000.0000 ug | ORAL_TABLET | Freq: Every day | ORAL | Status: DC
  Administered 2022-01-08 – 2022-01-14 (×7): 1000 ug via ORAL
  Filled 2022-01-08 (×7): qty 1

## 2022-01-08 MED ORDER — VITAMIN E 45 MG (100 UNIT) PO CAPS
100.0000 [IU] | ORAL_CAPSULE | Freq: Every day | ORAL | Status: DC
Start: 2022-01-08 — End: 2022-01-14
  Administered 2022-01-09 – 2022-01-14 (×6): 100 [IU] via ORAL
  Filled 2022-01-08 (×9): qty 1

## 2022-01-08 NOTE — Evaluation (Signed)
Occupational Therapy Evaluation Patient Details Name: Dale Edwards MRN: [id number] DOB: 1956-04-05 Today's Date: 01/08/2022   History of Present Illness The pt is a 66 yo male presenting 6/7 after falling in community. Pt with additional fall while in ED, striking his head on sink. Upon workup, CT revealed R larger than L subdural hematomas with mass effect and midline shift. S/p R craniotomy 6/11. PMH includes: MS, COPD, chronic alcohol and tobacco use, and homelessness.   Clinical Impression   Dale Edwards was evaluated s/p the above admission list, he is generally mod I at baseline and mobilized with rollator. He is homeless and states he has had "many" falls recently with progressively worsening tremors. Upon evaluation pt was limited by generalized weakness, R UE/LE tremors with functional movement, LUE sensory motor deficits, impulsivity and poor insight. Overall he required min A for functional ambulation within the room with rollator. Due to deficits listed above and below, he requires up to mod A for ADLs. He will benefit from OT acutely, recommend d/c to SNF for further therapy and assistance.      Recommendations for follow up therapy are one component of a multi-disciplinary discharge planning process, led by the attending physician.  Recommendations may be updated based on patient status, additional functional criteria and insurance authorization.   Follow Up Recommendations  Skilled nursing-short term rehab (<3 hours/day)    Assistance Recommended at Discharge Frequent or constant Supervision/Assistance  Patient can return home with the following A lot of help with walking and/or transfers;A lot of help with bathing/dressing/bathroom;Help with stairs or ramp for entrance;Assist for transportation;Direct supervision/assist for medications management    Functional Status Assessment  Patient has had a recent decline in their functional status and demonstrates the ability to make  significant improvements in function in a reasonable and predictable amount of time.  Equipment Recommendations  Other (comment) (pending progression)       Precautions / Restrictions Precautions Precautions: Fall Precaution Comments: bur hole with drain, pt with multiple recent falls Restrictions Weight Bearing Restrictions: No      Mobility Bed Mobility Overal bed mobility: Modified Independent             General bed mobility comments: pt completing movement to sitting EOB without assist, increased time    Transfers Overall transfer level: Needs assistance Equipment used: Rollator (4 wheels) Transfers: Sit to/from Stand Sit to Stand: Min assist                  Balance Overall balance assessment: History of Falls, Needs assistance Sitting-balance support: No upper extremity supported, Feet supported Sitting balance-Leahy Scale: Good     Standing balance support: Bilateral upper extremity supported, During functional activity Standing balance-Leahy Scale: Poor                             ADL either performed or assessed with clinical judgement   ADL Overall ADL's : Needs assistance/impaired Eating/Feeding: Set up;Sitting Eating/Feeding Details (indicate cue type and reason): needs assist with packaging Grooming: Minimal assistance;Sitting   Upper Body Bathing: Minimal assistance;Sitting   Lower Body Bathing: Moderate assistance;Sit to/from stand   Upper Body Dressing : Minimal assistance;Sitting   Lower Body Dressing: Moderate assistance;Sit to/from stand   Toilet Transfer: Minimal assistance;Rollator (4 wheels)   Toileting- Clothing Manipulation and Hygiene: Supervision/safety;Sitting/lateral lean       Functional mobility during ADLs: Minimal assistance;Rollator (4 wheels) General ADL Comments: unsteady, impulsive, littel insight into  safety and deficits. LUE sensory motor deficits and R tremors     Vision Baseline  Vision/History: 0 No visual deficits Vision Assessment?: Vision impaired- to be further tested in functional context Additional Comments: should be further assessed            Pertinent Vitals/Pain Pain Assessment Pain Assessment: Faces Faces Pain Scale: Hurts a little bit Pain Location: head Pain Descriptors / Indicators: Headache Pain Intervention(s): Limited activity within patient's tolerance, Monitored during session     Hand Dominance Right   Extremity/Trunk Assessment Upper Extremity Assessment Upper Extremity Assessment: RUE deficits/detail;LUE deficits/detail RUE Deficits / Details: baseline tremors with purposeful movement, tremors have been present "for years," but worsening over the last month RUE Coordination: decreased fine motor;decreased gross motor LUE Deficits / Details: impaired coordination. globally weak. apparent sensory motor deficits. using non-dom L hand for grooming and eating, requires incr time and effort LUE Sensation: WNL LUE Coordination: decreased fine motor   Lower Extremity Assessment Lower Extremity Assessment: Defer to PT evaluation   Cervical / Trunk Assessment Cervical / Trunk Assessment: Normal   Communication Communication Communication: No difficulties   Cognition Arousal/Alertness: Awake/alert Behavior During Therapy: Impulsive Overall Cognitive Status: Impaired/Different from baseline Area of Impairment: Safety/judgement, Awareness, Problem solving                         Safety/Judgement: Decreased awareness of safety, Decreased awareness of deficits Awareness: Intellectual Problem Solving: Difficulty sequencing, Requires verbal cues General Comments: cues for safety and impulsivity throughout. follows most 1-2 step commands. poor attention, easily distracted. Difficult to discern cog deficit vs. baseline     General Comments  VSS on RA            Home Living Family/patient expects to be discharged to::  Shelter/Homeless                   Additional Comments: pt states he is homeless, agreeable to shelter but states he has "no plan" when prompted during session      Prior Functioning/Environment Prior Level of Function : Independent/Modified Independent;History of Falls (last six months)             Mobility Comments: pt states he uses rollator for al mobility, has frequent falls due to MS, tremors have gotten worse in last month ADLs Comments: pt reports independence        OT Problem List: Decreased strength;Decreased range of motion;Decreased activity tolerance;Impaired balance (sitting and/or standing);Decreased coordination;Decreased cognition;Decreased safety awareness      OT Treatment/Interventions: Self-care/ADL training;Therapeutic exercise;DME and/or AE instruction;Patient/family education;Balance training;Therapeutic activities;Neuromuscular education    OT Goals(Current goals can be found in the care plan section) Acute Rehab OT Goals Patient Stated Goal: to feel better OT Goal Formulation: With patient Time For Goal Achievement: 01/22/22 Potential to Achieve Goals: Good ADL Goals Pt Will Perform Grooming: with modified independence;standing Pt Will Perform Upper Body Bathing: with modified independence;sitting Pt Will Perform Lower Body Bathing: with supervision;sit to/from stand Pt Will Perform Upper Body Dressing: with modified independence;sitting Pt Will Perform Lower Body Dressing: with supervision;sit to/from stand Pt Will Transfer to Toilet: with modified independence;ambulating  OT Frequency: Min 2X/week       AM-PAC OT "6 Clicks" Daily Activity     Outcome Measure Help from another person eating meals?: A Little Help from another person taking care of personal grooming?: A Little Help from another person toileting, which includes using toliet, bedpan, or urinal?: A  Little Help from another person bathing (including washing, rinsing, drying)?:  A Lot Help from another person to put on and taking off regular upper body clothing?: A Little Help from another person to put on and taking off regular lower body clothing?: A Lot 6 Click Score: 16   End of Session Equipment Utilized During Treatment: Rollator (4 wheels) Nurse Communication: Mobility status  Activity Tolerance: Patient tolerated treatment well Patient left: in chair;with call bell/phone within reach;with chair alarm set  OT Visit Diagnosis: Unsteadiness on feet (R26.81);Other abnormalities of gait and mobility (R26.89);Repeated falls (R29.6);History of falling (Z91.81);Muscle weakness (generalized) (M62.81)                Time: 3545-6256 OT Time Calculation (min): 22 min Charges:  OT General Charges $OT Visit: 1 Visit OT Evaluation $OT Eval Moderate Complexity: 1 Mod   Rosaire Cueto A Wyett Narine 01/08/2022, 2:45 PM

## 2022-01-08 NOTE — Progress Notes (Signed)
Nutrition Follow-up  DOCUMENTATION CODES:   Non-severe (moderate) malnutrition in context of social or environmental circumstances  INTERVENTION:   Ensure Enlive po BID, each supplement provides 350 kcal and 20 grams of protein.  MVI with minerals daily  Recommend: (discussed with Neurosurgery NP)  1000 mcg B12 daily 500 mg Vitamin C BID 1000 IU Vitamin D daily 100 IU Vitamin E daily   NUTRITION DIAGNOSIS:   Moderate Malnutrition related to  (homelessness) as evidenced by energy intake < or equal to 75% for > or equal to 1 month, moderate fat depletion, moderate muscle depletion. Ongoing.   GOAL:   Patient will meet greater than or equal to 90% of their needs Met with TF at goal  MONITOR:   PO intake, Supplement acceptance, Labs, Weight trends, Skin, I & O's  REASON FOR ASSESSMENT:   Malnutrition Screening Tool    ASSESSMENT:   65-year-old prior EtOH, tobacco, homelessness, history of multiple sclerosis, presents with fall and back pain. CT scan showed a new subdural hematoma with midline shift to the left.  Pt discussed during ICU rounds and with RN. Per RN pt with good appetite and intake. Per meal completion 100% of meals.   Vitamin labs resulted; pt deficient in multiple vitamins, discussed with NP and will replete.    6/11 s/p R crani for evacuation of SDH and JP drain  Medications reviewed and include: decadron, folic acid, MVI with minerals, protonix, senokot, thiamine  LR @ 50  ml/hr  Labs reviewed:  CRP: 1 Vitamin D: 13.1 LOW Vitamin A: 26.2 Vitamin B12: 173 LOW Vitamin B6: 9.2 Vitamin D: 0.2 LOW Vitamin E: 8.1 LOW Vitamin E: 1.5 Zinc: 57  R JP: 245 ml    Diet Order:   Diet Order             Diet regular Room service appropriate? Yes; Fluid consistency: Thin  Diet effective now                   EDUCATION NEEDS:   Not appropriate for education at this time  Skin:  Skin Assessment: Skin Integrity Issues: Skin Integrity Issues::  Other (Comment) Other: dermatitis R buttocks  Last BM:  6/10  Height:   Ht Readings from Last 1 Encounters:  01/03/22 5' 6" (1.676 m)    Weight:   Wt Readings from Last 1 Encounters:  01/07/22 70.1 kg    BMI:  Body mass index is 24.94 kg/m.  Estimated Nutritional Needs:   Kcal:  2000-2200  Protein:  100-110 grams  Fluid:  >/= 2 L/day   P., RD, LDN, CNSC See AMiON for contact information   

## 2022-01-08 NOTE — Progress Notes (Signed)
Subjective: Patient reports headache. No NT.   Objective: Vital signs in last 24 hours: Temp:  [97.9 F (36.6 C)-98.6 F (37 C)] 98.6 F (37 C) (06/13 0400) Pulse Rate:  [39-174] 55 (06/13 0600) Resp:  [15-27] 15 (06/13 0600) BP: (92-144)/(55-89) 100/57 (06/13 0600) SpO2:  [88 %-100 %] 98 % (06/13 0600) Arterial Line BP: (42-130)/(37-63) 47/42 (06/12 1500)  Intake/Output from previous day: 06/12 0701 - 06/13 0700 In: 1980.8 [P.O.:600; I.V.:1330.8; IV Piggyback:50] Out: [id number] [Urine:1500; Drains:245] Intake/Output this shift: No intake/output data recorded.  Neurologic: Grossly normal, stable "MS tremor" on right. Awake alert, MAEx4, dressing dry, drain with pink output  Lab Results: Lab Results  Component Value Date   WBC 20.3 (H) 01/07/2022   HGB 14.5 01/07/2022   HCT 41.3 01/07/2022   MCV 94.7 01/07/2022   PLT 286 01/07/2022   Lab Results  Component Value Date   INR 1.1 01/02/2022   BMET Lab Results  Component Value Date   NA 135 01/07/2022   K 4.6 01/07/2022   CL 108 01/07/2022   CO2 21 (L) 01/07/2022   GLUCOSE 174 (H) 01/07/2022   BUN 14 01/07/2022   CREATININE 0.76 01/07/2022   CALCIUM 8.4 (L) 01/07/2022    Studies/Results: CT HEAD WO CONTRAST  Result Date: 01/07/2022 CLINICAL DATA:  Subdural hematoma EXAM: CT HEAD WITHOUT CONTRAST TECHNIQUE: Contiguous axial images were obtained from the base of the skull through the vertex without intravenous contrast. RADIATION DOSE REDUCTION: This exam was performed according to the departmental dose-optimization program which includes automated exposure control, adjustment of the mA and/or kV according to patient size and/or use of iterative reconstruction technique. COMPARISON:  01/06/2022 FINDINGS: Brain: Mixed density subdural hematoma on the right with prior evacuation and drain placement. Increased thickness measured at 18 mm along the right parietal convexity, previously approximately 10 mm. Increased mass effect on  the right frontal parietal cortex. Mixed density subdural hematoma along the left cerebral convexity with high-density component measuring up to 16 mm in thickness, previously 12 mm. Midline shift measures up to 3 mm. No evidence of complicating infarct or hydrocephalus. Brain atrophy and chronic small vessel ischemia. Vascular: No hyperdense vessel or unexpected calcification. Skull: Unremarkable craniotomy on the right Sinuses/Orbits: Partial bilateral mastoid opacification with under pneumatization on the left. IMPRESSION: 1. Increased thickness of bilateral subdural hematoma, high-density clot measuring up to 18 mm thickness on the right and 10 mm on the left. 2. 3 mm of rightward midline shift. Electronically Signed   By: Jorje Guild M.D.   On: 01/07/2022 06:03   CT HEAD WO CONTRAST (5MM)  Result Date: 01/06/2022 CLINICAL DATA:  Subdural hematoma. EXAM: CT HEAD WITHOUT CONTRAST TECHNIQUE: Contiguous axial images were obtained from the base of the skull through the vertex without intravenous contrast. RADIATION DOSE REDUCTION: This exam was performed according to the departmental dose-optimization program which includes automated exposure control, adjustment of the mA and/or kV according to patient size and/or use of iterative reconstruction technique. COMPARISON:  Head CT 01/05/2022 FINDINGS: Brain: Sequelae of interval right parietal craniotomy for subdural hematoma evacuation are identified. A subdural drain, Gel-Foam, and small volume pneumocephalus are noted. Residual mixed density subdural fluid collection/hematoma over the right cerebral convexity measures up to 1 cm in thickness. Mass effect on the right cerebral hemisphere has greatly decreased, and midline shift has resolved. A pre-existing small subdural collection over the left cerebral convexity has mildly enlarged and now contains hyperdense blood products consistent with interval hemorrhage, and this collection  now measures up to 1.2 cm in  thickness with mild mass effect on the left frontal and parietal lobes. A small amount of new/acute subdural hemorrhage is also noted along the falx measuring up to 5 mm in thickness. No acute infarct or hydrocephalus is evident. Patchy hypodensities in the cerebral white matter bilaterally are similar to the prior CT and are nonspecific but compatible with moderate chronic small vessel ischemic disease. Vascular: Calcified atherosclerosis at the skull base. No hyperdense vessel. Skull: Right parietal craniotomy.  Overlying skin staples. Sinuses/Orbits: Clear paranasal sinuses. Bilateral mastoid effusions. Unremarkable orbits. Other: None. IMPRESSION: 1. Interval right-sided subdural hematoma evacuation. Residual mixed density subdural collection as above with greatly decreased mass effect and resolved midline shift. 2. New acute hemorrhage within a pre-existing small left-sided subdural hematoma and along the falx. Only mild associated mass effect. 3. Moderate chronic small vessel ischemic disease. These results will be called to the ordering clinician or representative by the Radiologist Assistant, and communication documented in the PACS or [employer]. Electronically Signed   By: Logan Bores M.D.   On: 01/06/2022 17:53    Assessment/Plan: Bilateral SDH. Etiology of L SDH unclear, maybe from resolution of shift. R SDH from subgaleal bleeding most likely. He's doing well clinically so will watch these for now in hopes they will resolve. Repeat head CT tomorrow, mobilize.  Estimated body mass index is 24.94 kg/m as calculated from the following:   Height as of this encounter: 5\' 6"  (1.676 m).   Weight as of this encounter: 70.1 kg.    LOS: 5 days    Dale Edwards 01/08/2022, 7:56 AM

## 2022-01-08 NOTE — Progress Notes (Signed)
PT Cancellation Note  Patient Details Name: Dale Edwards MRN: [id number] DOB: Jan 07, 1956   Cancelled Treatment:    Reason Eval/Treat Not Completed: Pain limiting ability to participate. Pt refused reporting his head is hurting and he was up with OT earlier.   [place] 01/08/2022, 4:05 PM [place] Office [phone]

## 2022-01-08 NOTE — Progress Notes (Signed)
Attempted to see patient regarding SNF recommendation. Patient sleeping and would not awaken. Will return at a later time.   Joaquin Courts, MSW, [facility]

## 2022-01-09 ENCOUNTER — Inpatient Hospital Stay (HOSPITAL_COMMUNITY): Payer: Medicare Other

## 2022-01-09 LAB — BASIC METABOLIC PANEL
Anion gap: 6 (ref 5–15)
BUN: 22 mg/dL (ref 8–23)
CO2: 25 mmol/L (ref 22–32)
Calcium: 8.3 mg/dL — ABNORMAL LOW (ref 8.9–10.3)
Chloride: 106 mmol/L (ref 98–111)
Creatinine, Ser: 0.77 mg/dL (ref 0.61–1.24)
GFR, Estimated: >60 mL/min (ref >60)
Glucose, Bld: 131 mg/dL — ABNORMAL HIGH (ref 70–99)
Potassium: 4.4 mmol/L (ref 3.5–5.1)
Sodium: 137 mmol/L (ref 135–145)

## 2022-01-09 LAB — MAGNESIUM: Magnesium: 2.1 mg/dL (ref 1.7–2.4)

## 2022-01-09 MED ORDER — ATORVASTATIN CALCIUM 10 MG PO TABS
20.0000 mg | ORAL_TABLET | Freq: Every day | ORAL | Status: DC
  Administered 2022-01-09 – 2022-01-14 (×6): 20 mg via ORAL
  Filled 2022-01-09 (×6): qty 2

## 2022-01-09 NOTE — Progress Notes (Signed)
Subjective: Patient reports doing well no acute events overnight   Objective: Vital signs in last 24 hours: Temp:  [97.6 F (36.4 C)-98.7 F (37.1 C)] 97.6 F (36.4 C) (06/14 0800) Pulse Rate:  [45-74] 49 (06/14 0700) Resp:  [11-34] 11 (06/14 0700) BP: (88-125)/(39-111) 93/69 (06/14 0700) SpO2:  [93 %-98 %] 93 % (06/14 0700) Weight:  [67 kg] 67 kg (06/14 0500)  Intake/Output from previous day: 06/13 0701 - 06/14 0700 In: 1220.8 [I.V.:1220.8] Out: 190 [Drains:190] Intake/Output this shift: No intake/output data recorded.  Neurologic: Grossly normal  Lab Results: Lab Results  Component Value Date   WBC 20.3 (H) 01/07/2022   HGB 14.5 01/07/2022   HCT 41.3 01/07/2022   MCV 94.7 01/07/2022   PLT 286 01/07/2022   Lab Results  Component Value Date   INR 1.1 01/02/2022   BMET Lab Results  Component Value Date   NA 137 01/09/2022   K 4.4 01/09/2022   CL 106 01/09/2022   CO2 25 01/09/2022   GLUCOSE 131 (H) 01/09/2022   BUN 22 01/09/2022   CREATININE 0.77 01/09/2022   CALCIUM 8.3 (L) 01/09/2022    Studies/Results: CT HEAD WO CONTRAST ( )  Result Date: 01/09/2022 CLINICAL DATA:  66 year old male postoperative day 3 status post right craniotomy, subdural hematoma evacuation. EXAM: CT HEAD WITHOUT CONTRAST TECHNIQUE: Contiguous axial images were obtained from the base of the skull through the vertex without intravenous contrast. RADIATION DOSE REDUCTION: This exam was performed according to the departmental dose-optimization program which includes automated exposure control, adjustment of the mA and/or kV according to patient size and/or use of iterative reconstruction technique. COMPARISON:  Head CT 01/07/2022 and earlier. FINDINGS: Brain: Percutaneous subdural drain remains in place on the right. Underlying mixed density right superior convexity subdural hematoma with a small volume of postoperative pneumocephalus has decreased from about 19 mm in thickness to 15 mm now  (coronal image 36). Additional right side para falcine blood anteriorly is stable and small volume. Contralateral lobulated and mostly hyperdense left side subdural hematoma is stable measuring up to 14 mm in thickness (coronal image 25). Stable mass effect on the superior cerebral convexities with maintained normal basilar cisterns and only trace rightward midline shift. No ventriculomegaly or IVH. No new areas of intracranial hemorrhage identified. Patchy and confluent hypodensity in the bilateral cerebral white matter, right basal ganglia are stable. No cortically based acute infarct identified. Vascular: Calcified atherosclerosis at the skull base. Skull: Right parietal craniotomy. No acute osseous abnormality identified. Sinuses/Orbits: Unchanged left mastoid opacification. Other Visualized paranasal sinuses and mastoids are stable and well aerated. Other: Mildly Disconjugate gaze, otherwise negative orbits. Stable postoperative changes to the superior right scalp convexity. Skin staples remain in place. IMPRESSION: 1. Right side subdural drain remains in place with superior convexity subdural hematoma on that side decreased to 15 mm thickness now. 2. Contralateral lobulated left superior convexity subdural is stable measuring up to 14 mm. 3. Stable mass effect on the superior cerebral convexities, with normal basilar cisterns and only trace rightward midline shift. 4. No new intracranial abnormality. Electronically Signed   By: Odessa Fleming M.D.   On: 01/09/2022 06:15    Assessment/Plan: Postop day 3 crani for sdh, will transfer to 4NP. Will likely remove drain tomorrow. Continue therapy, will work on SNF placement   LOS: 6 days    Tiana Loft [facility] 01/09/2022, 8:55 AM

## 2022-01-09 NOTE — Progress Notes (Signed)
Physical Therapy Treatment Patient Details Name: Dale Edwards MRN: [id number] DOB: 09/26/1955 Today's Date: 01/09/2022   History of Present Illness The pt is a 66 yo male presenting 6/7 after falling in community. Pt with additional fall while in ED, striking his head on sink. Upon workup, CT revealed R larger than L subdural hematomas with mass effect and midline shift. S/p R craniotomy 6/11. PMH includes: MS, COPD, chronic alcohol and tobacco use, and homelessness.    PT Comments    Patient initially reluctant to participate with therapy, but with encouragement, agreeable for limited session. Patient ambulated 200' with rollator and minA for balance as patient tends to ambulate outside of rollator especially with turns. Patient with difficulty problem solving use of rollator at times during session. Patient complaining of fatigue and unwilling to participate further. Continue to recommend SNF for ongoing Physical Therapy.       Recommendations for follow up therapy are one component of a multi-disciplinary discharge planning process, led by the attending physician.  Recommendations may be updated based on patient status, additional functional criteria and insurance authorization.  Follow Up Recommendations  Skilled nursing-short term rehab (<3 hours/day)     Assistance Recommended at Discharge Frequent or constant Supervision/Assistance  Patient can return home with the following A little help with walking and/or transfers;A little help with bathing/dressing/bathroom;Assistance with cooking/housework;Direct supervision/assist for medications management;Direct supervision/assist for financial management;Assist for transportation;Help with stairs or ramp for entrance   Equipment Recommendations  Rollator (4 wheels) (current rollator has flat spots on wheels which make it difficult for patient)    Recommendations for Other Services       Precautions / Restrictions  Precautions Precautions: Fall Precaution Comments: JP drain on R side of head, pt with multiple recent falls Restrictions Weight Bearing Restrictions: No     Mobility  Bed Mobility Overal bed mobility: Modified Independent                  Transfers Overall transfer level: Needs assistance Equipment used: Rollator (4 wheels) Transfers: Sit to/from Stand Sit to Stand: Min assist           General transfer comment: minA for standing from EOB to steady upon standing    Ambulation/Gait Ambulation/Gait assistance: Min assist Gait Distance (Feet): 200 Feet Assistive device: Rollator (4 wheels) Gait Pattern/deviations: Step-through pattern, Decreased stride length, Narrow base of support Gait velocity: decreased     General Gait Details: Patient with R>L LE tremors during mobility. MinA for balance with patient ambulating outside of rollator at times requiring cues to maintain close proximity.   Stairs             Wheelchair Mobility    Modified Rankin (Stroke Patients Only)       Balance Overall balance assessment: History of Falls, Needs assistance Sitting-balance support: No upper extremity supported, Feet supported Sitting balance-Leahy Scale: Good     Standing balance support: Bilateral upper extremity supported, During functional activity Standing balance-Leahy Scale: Poor Standing balance comment: dependent on BUE support                            Cognition Arousal/Alertness: Awake/alert Behavior During Therapy: Impulsive Overall Cognitive Status: Impaired/Different from baseline Area of Impairment: Safety/judgement, Awareness, Problem solving                         Safety/Judgement: Decreased awareness of safety, Decreased awareness of deficits  Awareness: Intellectual Problem Solving: Difficulty sequencing, Requires verbal cues General Comments: cues for safety. Slow processing and STM deficits noted during  session. Difficulty problem solving rollator brakes and locking them prior to ambulating but poor awareness of why rollator won't roll        Exercises Other Exercises Other Exercises: sit to stand x 5 from EOB    General Comments General comments (skin integrity, edema, etc.): VSS on RA      Pertinent Vitals/Pain Pain Assessment Pain Assessment: Faces Faces Pain Scale: Hurts a little bit Pain Location: head Pain Descriptors / Indicators: Headache Pain Intervention(s): Monitored during session    Home Living                          Prior Function            PT Goals (current goals can now be found in the care plan section) Acute Rehab PT Goals Patient Stated Goal: return to independence PT Goal Formulation: With patient Time For Goal Achievement: 01/21/22 Potential to Achieve Goals: Good Progress towards PT goals: Progressing toward goals    Frequency    Min 3X/week      PT Plan Current plan remains appropriate    Co-evaluation              AM-PAC PT "6 Clicks" Mobility   Outcome Measure  Help needed turning from your back to your side while in a flat bed without using bedrails?: None Help needed moving from lying on your back to sitting on the side of a flat bed without using bedrails?: A Little Help needed moving to and from a bed to a chair (including a wheelchair)?: A Lot Help needed standing up from a chair using your arms (e.g., wheelchair or bedside chair)?: A Little Help needed to walk in hospital room?: A Lot Help needed climbing 3-5 steps with a railing? : A Lot 6 Click Score: 16    End of Session Equipment Utilized During Treatment: Gait belt Activity Tolerance: Patient tolerated treatment well Patient left: in bed;with call bell/phone within reach;with bed alarm set Nurse Communication: Mobility status PT Visit Diagnosis: Other abnormalities of gait and mobility (R26.89);Repeated falls (R29.6)     Time: 5784-6962 PT Time  Calculation (min) (ACUTE ONLY): 27 min  Charges:  $Gait Training: 8-22 mins $Therapeutic Exercise: 8-22 mins                     Micah Galeno A. Dan Humphreys PT, DPT Acute Rehabilitation Services Office [phone]    Viviann Spare 01/09/2022, 5:08 PM

## 2022-01-09 NOTE — NC FL2 (Signed)
MEDICAID FL2 LEVEL OF CARE SCREENING TOOL     IDENTIFICATION  Patient Name: Dale Edwards Birthdate: 1955/11/13 Sex: male Admission Date (Current Location): 01/02/2022  [facility] and [place] Number:  Producer, television/film/video and Address:  [facility], [address], [place], [place] [address]      Provider Number: [id number]  Attending Physician Name and Address:  Tia Alert, MD  Relative Name and Phone Number:       Current Level of Care: Hospital Recommended Level of Care: Skilled Nursing Facility Prior Approval Number:    Date Approved/Denied:   PASRR Number: [id number]  Discharge Plan: SNF    Current Diagnoses: Patient Active Problem List   Diagnosis Date Noted   S/P craniotomy 01/06/2022   Malnutrition of moderate degree 01/05/2022   Subdural hematoma (HCC) 01/02/2022   Decubitus ulcer of buttock 01/02/2022   Generalized weakness 07/12/2021   Tobacco abuse 07/12/2021   Alcohol abuse 07/12/2021   Homeless 07/12/2021   COPD (chronic obstructive pulmonary disease) (HCC) 07/12/2021   Prolonged QT interval 07/12/2021   Relapsing remitting multiple sclerosis (HCC) 10/12/2019   Tremor of right hand 10/12/2019   Gait abnormality 10/12/2019   Tremor 10/30/2017   Vitamin D deficiency 04/10/2017   Lumbar radiculopathy 11/15/2014   Low back pain 08/01/2014   Multiple sclerosis (HCC) 02/09/2013   Abnormality of gait 01/08/2013   Right sided weakness 01/08/2013   EMPHYSEMA 02/17/2009   DYSPNEA 02/17/2009    Orientation RESPIRATION BLADDER Height & Weight     Self, Situation, Time, Place  Normal Continent, External catheter Weight: 147 lb 11.3 oz (67 kg) Height:  5\' 6"  (167.6 cm)  BEHAVIORAL SYMPTOMS/MOOD NEUROLOGICAL BOWEL NUTRITION STATUS      Continent Diet (See dc summary)  AMBULATORY STATUS COMMUNICATION OF NEEDS Skin   Limited Assist Verbally Surgical wounds (Closed incision on head; Dermatitis on buttocks d/t  incontinence)                       Personal Care Assistance Level of Assistance  Bathing, Feeding, Dressing Bathing Assistance: Limited assistance Feeding assistance: Independent Dressing Assistance: Limited assistance     Functional Limitations Info             SPECIAL CARE FACTORS FREQUENCY  PT (By licensed PT), OT (By licensed OT)     PT Frequency: 5x/week OT Frequency: 5x/week            Contractures Contractures Info: Not present    Additional Factors Info  Code Status, Allergies Code Status Info: Full Allergies Info: Hydrocodone-acetaminophen           Current Medications (01/09/2022):  This is the current hospital active medication list Current Facility-Administered Medications  Medication Dose Route Frequency Provider Last Rate Last Admin   acetaminophen (TYLENOL) tablet 650 mg  650 mg Oral Q4H PRN 01/11/2022, MD   650 mg at 01/09/22 0407   Or   acetaminophen (TYLENOL) suppository 650 mg  650 mg Rectal Q4H PRN 01/11/22, MD       albuterol (PROVENTIL) (2.5 MG/3ML) 0.083% nebulizer solution 2.5 mg  2.5 mg Nebulization Q2H PRN Tia Alert, MD       ascorbic acid (VITAMIN C) tablet 1,000 mg  1,000 mg Oral Daily Meyran, Tia Alert, NP   1,000 mg at 01/08/22 1648   Chlorhexidine Gluconate Cloth 2 % PADS 6 each  6 each Topical Q0600 01/10/22, MD  6 each at 01/08/22 0945   cholecalciferol (VITAMIN D3) tablet 1,000 Units  1,000 Units Oral Daily Sherryl Manges, NP   1,000 Units at 01/08/22 1648   dexamethasone (DECADRON) injection 4 mg  4 mg Intravenous Q8H Tia Alert, MD   4 mg at 01/09/22 0606   feeding supplement (ENSURE ENLIVE / ENSURE PLUS) liquid 237 mL  237 mL Oral BID BM Tia Alert, MD   237 mL at 01/08/22 1515   folic acid (FOLVITE) tablet 1 mg  1 mg Oral Daily Tia Alert, MD   1 mg at 01/08/22 6734   Gerhardt's butt cream   Topical BID Tia Alert, MD   1 application  at 01/08/22 2154   labetalol  (NORMODYNE) injection 10-40 mg  10-40 mg Intravenous Q10 min PRN Tia Alert, MD       lactated ringers infusion   Intravenous Continuous Rhetta Mura, MD 50 mL/hr at 01/09/22 0700 Infusion Verify at 01/09/22 0700   levETIRAcetam (KEPPRA) tablet 500 mg  500 mg Oral BID Rhetta Mura, MD   500 mg at 01/08/22 2149   morphine (PF) 2 MG/ML injection 1-2 mg  1-2 mg Intravenous Q2H PRN Tia Alert, MD   2 mg at 01/09/22 1937   multivitamin with minerals tablet 1 tablet  1 tablet Oral Daily Tia Alert, MD   1 tablet at 01/08/22 0944   mupirocin ointment (BACTROBAN) 2 % 1 application   1 application  Nasal BID Tia Alert, MD   1 application  at 01/08/22 2151   ondansetron (ZOFRAN) tablet 4 mg  4 mg Oral Q4H PRN Tia Alert, MD       Or   ondansetron [facility]) injection 4 mg  4 mg Intravenous Q4H PRN Tia Alert, MD       pantoprazole (PROTONIX) EC tablet 40 mg  40 mg Oral QHS Pham, Minh Q, RPH-CPP   40 mg at 01/08/22 2149   promethazine (PHENERGAN) tablet 12.5-25 mg  12.5-25 mg Oral Q4H PRN Tia Alert, MD       senna [facility]) tablet 8.6 mg  1 tablet Oral BID Tia Alert, MD   8.6 mg at 01/08/22 2149   thiamine tablet 100 mg  100 mg Oral Daily Tia Alert, MD   100 mg at 01/08/22 0944   vitamin B-12 (CYANOCOBALAMIN) tablet 1,000 mcg  1,000 mcg Oral Daily Sherryl Manges, NP   1,000 mcg at 01/08/22 1648   vitamin E capsule 100 Units  100 Units Oral Daily Meyran, Tiana Loft, NP         Discharge Medications: Please see discharge summary for a list of discharge medications.  Relevant Imaging Results:  Relevant Lab Results:   Additional Information SSN [id number] [place], [place]

## 2022-01-09 NOTE — Progress Notes (Signed)
I had a discussion with Dr. Kathyrn Sheriff today and went over this patient's imaging, and we spoke about the utility of bilateral MMA embolization for treatment of the subdural hematomas.  I think this may be beneficial.  It may reduce the risk of recurrent hematoma.  He is going to consider this procedure later this week.  He will discuss this with the patient

## 2022-01-09 NOTE — Progress Notes (Signed)
Dr. Conchita Paris paged about patient's frequent PVCs, bigeminy. New orders received  for BMP and magnesium morning labs. Will continue to monitor.

## 2022-01-10 NOTE — Progress Notes (Signed)
Subjective: Patient reports headaches are moderate but overall doing well   Objective: Vital signs in last 24 hours: Temp:  [97.6 F (36.4 C)-98 F (36.7 C)] 98 F (36.7 C) (06/15 0631) Pulse Rate:  [50-69] 57 (06/15 0631) Resp:  [13-23] 14 (06/15 0631) BP: (92-113)/(53-76) 96/60 (06/15 0631) SpO2:  [94 %-100 %] 100 % (06/15 0631)  Intake/Output from previous day: 06/14 0701 - 06/15 0700 In: 170.6 [I.V.:170.6] Out: 430 [Urine:250; Drains:180] Intake/Output this shift: No intake/output data recorded.  Neurologic: Grossly normal  Lab Results: Lab Results  Component Value Date   WBC 20.3 (H) 01/07/2022   HGB 14.5 01/07/2022   HCT 41.3 01/07/2022   MCV 94.7 01/07/2022   PLT 286 01/07/2022   Lab Results  Component Value Date   INR 1.1 01/02/2022   BMET Lab Results  Component Value Date   NA 137 01/09/2022   K 4.4 01/09/2022   CL 106 01/09/2022   CO2 25 01/09/2022   GLUCOSE 131 (H) 01/09/2022   BUN 22 01/09/2022   CREATININE 0.77 01/09/2022   CALCIUM 8.3 (L) 01/09/2022    Studies/Results: CT HEAD WO CONTRAST ( )  Result Date: 01/09/2022 CLINICAL DATA:  66 year old male postoperative day 3 status post right craniotomy, subdural hematoma evacuation. EXAM: CT HEAD WITHOUT CONTRAST TECHNIQUE: Contiguous axial images were obtained from the base of the skull through the vertex without intravenous contrast. RADIATION DOSE REDUCTION: This exam was performed according to the departmental dose-optimization program which includes automated exposure control, adjustment of the mA and/or kV according to patient size and/or use of iterative reconstruction technique. COMPARISON:  Head CT 01/07/2022 and earlier. FINDINGS: Brain: Percutaneous subdural drain remains in place on the right. Underlying mixed density right superior convexity subdural hematoma with a small volume of postoperative pneumocephalus has decreased from about 19 mm in thickness to 15 mm now (coronal image 36).  Additional right side para falcine blood anteriorly is stable and small volume. Contralateral lobulated and mostly hyperdense left side subdural hematoma is stable measuring up to 14 mm in thickness (coronal image 25). Stable mass effect on the superior cerebral convexities with maintained normal basilar cisterns and only trace rightward midline shift. No ventriculomegaly or IVH. No new areas of intracranial hemorrhage identified. Patchy and confluent hypodensity in the bilateral cerebral white matter, right basal ganglia are stable. No cortically based acute infarct identified. Vascular: Calcified atherosclerosis at the skull base. Skull: Right parietal craniotomy. No acute osseous abnormality identified. Sinuses/Orbits: Unchanged left mastoid opacification. Other Visualized paranasal sinuses and mastoids are stable and well aerated. Other: Mildly Disconjugate gaze, otherwise negative orbits. Stable postoperative changes to the superior right scalp convexity. Skin staples remain in place. IMPRESSION: 1. Right side subdural drain remains in place with superior convexity subdural hematoma on that side decreased to 15 mm thickness now. 2. Contralateral lobulated left superior convexity subdural is stable measuring up to 14 mm. 3. Stable mass effect on the superior cerebral convexities, with normal basilar cisterns and only trace rightward midline shift. 4. No new intracranial abnormality. Electronically Signed   By: Odessa Fleming M.D.   On: 01/09/2022 06:15    Assessment/Plan: Postop day 4 crani for SDH, doing well. JP drain removed today. Not planning on taking him back to surgery at this time since he is doing well. We did discuss MMA embolization with Dr. Conchita Paris yesterday and he may consider that. Continue to ambulate and work with therapy.    LOS: 7 days    Tiana Loft [facility] 01/10/2022,  8:01 AM

## 2022-01-10 NOTE — Progress Notes (Signed)
Occupational Therapy Treatment Patient Details Name: Dale Edwards MRN: [id number] DOB: 1956-05-14 Today's Date: 01/10/2022   History of present illness The pt is a 66 yo male presenting 6/7 after falling in community. Pt with additional fall while in ED, striking his head on sink. Upon workup, CT revealed R larger than L subdural hematomas with mass effect and midline shift. S/p R craniotomy 6/11. PMH includes: MS, COPD, chronic alcohol and tobacco use, and homelessness.   OT comments  Pt supine in bed and agreeable to OT session.  Pt session focused on functional use of dominant R UE during self feeding.  Trial of various techniques/AE to decreased tremors, but patient becoming frustrated and very little buy in to using adaptive techniques.   He showed best success with elbow stabilization and Ucuff for elbow flexion/extension movement only, but still declines technique after a few repetitions. He does report he would like to use RUE to eat, but doesn't put much effort into trying. Educated pt on completing gross motor exercises for R UE (shoulder flexion, elbow flexion/extension) in order to increased strength and coordination in preparation for using dominant R UE for self feeding.  Overall requires setup for L UE to open containers and cut food. Will follow acutely.    Recommendations for follow up therapy are one component of a multi-disciplinary discharge planning process, led by the attending physician.  Recommendations may be updated based on patient status, additional functional criteria and insurance authorization.    Follow Up Recommendations  Skilled nursing-short term rehab (<3 hours/day)    Assistance Recommended at Discharge Frequent or constant Supervision/Assistance  Patient can return home with the following  A lot of help with walking and/or transfers;A lot of help with bathing/dressing/bathroom;Help with stairs or ramp for entrance;Assist for transportation;Direct  supervision/assist for medications management   Equipment Recommendations   (pending progression)    Recommendations for Other Services      Precautions / Restrictions Precautions Precautions: Fall Precaution Comments: pt with multiple recent falls Restrictions Weight Bearing Restrictions: No       Mobility Bed Mobility Overal bed mobility: Modified Independent                  Transfers                         Balance                                           ADL either performed or assessed with clinical judgement   ADL Overall ADL's : Needs assistance/impaired Eating/Feeding: Set up;Sitting Eating/Feeding Details (indicate cue type and reason): requires setup for opening packages and cutting food.  Using L non dominant hand to feed with 'non dominant' coordination.  Pt voices frustration with not being able to use R hand, attempted multple trials of AE with R UE including: red built up handle, weighted cup, ucuff with elbow supported on table. Pt happy to try AE for 1-2 trials but then becomes frustrated and does not attempt further.  Best success with supported elbow and ucuff to focus on simple elbow flex/extension movement.                                        Extremity/Trunk Assessment  Vision       Perception     Praxis      Cognition Arousal/Alertness: Awake/alert Behavior During Therapy: Impulsive Overall Cognitive Status: Impaired/Different from baseline Area of Impairment: Safety/judgement, Awareness, Problem solving                         Safety/Judgement: Decreased awareness of safety, Decreased awareness of deficits Awareness: Emergent Problem Solving: Difficulty sequencing, Requires verbal cues          Exercises      Shoulder Instructions       General Comments encouraged pt to complete basic movement exercises with R UE throughout the day: shoulder  flexion and elbow flexion/extension to increased strength for ADL participation    Pertinent Vitals/ Pain       Pain Assessment Pain Assessment: Faces Faces Pain Scale: Hurts a little bit Pain Location: head Pain Descriptors / Indicators: Headache Pain Intervention(s): Monitored during session, Repositioned  Home Living                                          Prior Functioning/Environment              Frequency  Min 2X/week        Progress Toward Goals  OT Goals(current goals can now be found in the care plan section)  Progress towards OT goals: Progressing toward goals  Acute Rehab OT Goals Time For Goal Achievement: 01/22/22 Potential to Achieve Goals: Good  Plan Discharge plan remains appropriate;Frequency remains appropriate    Co-evaluation                 AM-PAC OT "6 Clicks" Daily Activity     Outcome Measure   Help from another person eating meals?: A Little Help from another person taking care of personal grooming?: A Little Help from another person toileting, which includes using toliet, bedpan, or urinal?: A Little Help from another person bathing (including washing, rinsing, drying)?: A Lot Help from another person to put on and taking off regular upper body clothing?: A Little Help from another person to put on and taking off regular lower body clothing?: A Lot 6 Click Score: 16    End of Session    OT Visit Diagnosis: Unsteadiness on feet (R26.81);Other abnormalities of gait and mobility (R26.89);Repeated falls (R29.6);History of falling (Z91.81);Muscle weakness (generalized) (M62.81)   Activity Tolerance Patient tolerated treatment well   Patient Left with call bell/phone within reach;Other (comment);with bed alarm set (seated EOB)   Nurse Communication Mobility status        Time: 3235-5732 OT Time Calculation (min): 35 min  Charges: OT General Charges $OT Visit: 1 Visit OT Evaluation $OT Eval Low  Complexity: 1 Low OT Treatments $Self Care/Home Management : 23-37 mins  Barry Brunner, OT Acute Rehabilitation Services Office [phone]   Chancy Milroy 01/10/2022, 1:07 PM

## 2022-01-11 NOTE — Progress Notes (Signed)
Subjective: Patient reports moderate headaches Objective: Vital signs in last 24 hours: Temp:  [98 F (36.7 C)-98.4 F (36.9 C)] 98.2 F (36.8 C) (06/16 0727) Pulse Rate:  [56-67] 57 (06/16 0727) Resp:  [16-20] 16 (06/16 0727) BP: (89-104)/(45-77) 89/45 (06/16 0727) SpO2:  [95 %-98 %] 98 % (06/16 0727)  Intake/Output from previous day: 06/15 0701 - 06/16 0700 In: 237 [P.O.:237] Out: -  Intake/Output this shift: No intake/output data recorded.  Neurologic: Grossly normal  Lab Results: Lab Results  Component Value Date   WBC 20.3 (H) 01/07/2022   HGB 14.5 01/07/2022   HCT 41.3 01/07/2022   MCV 94.7 01/07/2022   PLT 286 01/07/2022   Lab Results  Component Value Date   INR 1.1 01/02/2022   BMET Lab Results  Component Value Date   NA 137 01/09/2022   K 4.4 01/09/2022   CL 106 01/09/2022   CO2 25 01/09/2022   GLUCOSE 131 (H) 01/09/2022   BUN 22 01/09/2022   CREATININE 0.77 01/09/2022   CALCIUM 8.3 (L) 01/09/2022    Studies/Results: No results found.  Assessment/Plan: Postop day 5 crani for right SDH. Doing well and making improvements. Pending SNF discharge. Continue therapy for now    LOS: 8 days    Dale Edwards [facility] 01/11/2022, 10:28 AM

## 2022-01-11 NOTE — Care Management Important Message (Signed)
Important Message  Patient Details  Name: Dale Edwards MRN: [id number] Date of Birth: [birth year]   Medicare Important Message Given:  Yes     Dale Edwards Dale Edwards 01/11/2022, 9:19 AM

## 2022-01-11 NOTE — Progress Notes (Signed)
Physical Therapy Treatment Patient Details Name: Dale Edwards MRN: [id number] DOB: 06-Jul-1956 Today's Date: 01/11/2022   History of Present Illness The pt is a 66 yo male presenting 6/7 after falling in community. Pt with additional fall while in ED, striking his head on sink. Upon workup, CT revealed R larger than L subdural hematomas with mass effect and midline shift. S/p R craniotomy 6/11. PMH includes: MS, COPD, chronic alcohol and tobacco use, and homelessness.   PT Comments    Patient was agreeable to PT treatment. He continues to require assistance with standing and ambulation using rollator. Limited carry over of instruction for improved gait kinematics, posture, and safety with ambulation. Activity tolerance limited by fatigue. Continue to recommend SNF placement at discharge to maximize functional independence and decrease caregiver burden.    Recommendations for follow up therapy are one component of a multi-disciplinary discharge planning process, led by the attending physician.  Recommendations may be updated based on patient status, additional functional criteria and insurance authorization.  Follow Up Recommendations  Skilled nursing-short term rehab (<3 hours/day)     Assistance Recommended at Discharge Frequent or constant Supervision/Assistance  Patient can return home with the following A little help with walking and/or transfers;A little help with bathing/dressing/bathroom;Assistance with cooking/housework;Direct supervision/assist for medications management;Direct supervision/assist for financial management;Assist for transportation;Help with stairs or ramp for entrance   Equipment Recommendations  Rollator (4 wheels)    Recommendations for Other Services       Precautions / Restrictions Precautions Precautions: Fall Restrictions Weight Bearing Restrictions: No     Mobility  Bed Mobility Overal bed mobility: Needs Assistance Bed Mobility: Supine to Sit      Supine to sit: Min assist     General bed mobility comments: trunk support provided with sitting upright. increased time and effort required. no dizziness is reported with upright activity    Transfers Overall transfer level: Needs assistance Equipment used: Rollator (4 wheels) Transfers: Sit to/from Stand Sit to Stand: Min assist           General transfer comment: lifting assistance required for standing. cues for hand placement    Ambulation/Gait Ambulation/Gait assistance: Min assist Gait Distance (Feet): 175 Feet Assistive device: Rollator (4 wheels) Gait Pattern/deviations: Narrow base of support, Trunk flexed, Decreased stride length Gait velocity: decreased     General Gait Details: verbal cues for upright standing posture and holding rollator closer to base of support with limited carry over demonstrated. poor safety awareness overall with decreased awareness of deficits. patient has worsening gait pattern with fatigue. steadying assistance provided   Stairs             Wheelchair Mobility    Modified Rankin (Stroke Patients Only)       Balance Overall balance assessment: History of Falls, Needs assistance Sitting-balance support: No upper extremity supported, Feet supported Sitting balance-Leahy Scale: Good     Standing balance support: Bilateral upper extremity supported, During functional activity Standing balance-Leahy Scale: Fair Standing balance comment: with UE supported on walker                            Cognition Arousal/Alertness: Awake/alert Behavior During Therapy: Impulsive Overall Cognitive Status: Impaired/Different from baseline Area of Impairment: Safety/judgement, Awareness, Problem solving                         Safety/Judgement: Decreased awareness of safety, Decreased awareness of deficits Awareness: Emergent  Problem Solving: Difficulty sequencing, Requires verbal cues General Comments: cues  for increased safety awareness with mobility        Exercises      General Comments General comments (skin integrity, edema, etc.): patient declined LE exercises due to fatigue      Pertinent Vitals/Pain Pain Assessment Pain Assessment: No/denies pain    Home Living                          Prior Function            PT Goals (current goals can now be found in the care plan section) Acute Rehab PT Goals Patient Stated Goal: return to independence PT Goal Formulation: With patient Time For Goal Achievement: 01/21/22 Potential to Achieve Goals: Good Progress towards PT goals: Progressing toward goals    Frequency    Min 3X/week      PT Plan Current plan remains appropriate    Co-evaluation              AM-PAC PT "6 Clicks" Mobility   Outcome Measure  Help needed turning from your back to your side while in a flat bed without using bedrails?: None Help needed moving from lying on your back to sitting on the side of a flat bed without using bedrails?: A Little Help needed moving to and from a bed to a chair (including a wheelchair)?: A Lot Help needed standing up from a chair using your arms (e.g., wheelchair or bedside chair)?: A Little Help needed to walk in hospital room?: A Lot Help needed climbing 3-5 steps with a railing? : A Lot 6 Click Score: 16    End of Session Equipment Utilized During Treatment: Gait belt Activity Tolerance: Patient tolerated treatment well Patient left: in bed;with call bell/phone within reach;with bed alarm set (sitting up on edge of bed with lunch tray set-up)   PT Visit Diagnosis: Other abnormalities of gait and mobility (R26.89);Repeated falls (R29.6)     Time: 9528-4132 PT Time Calculation (min) (ACUTE ONLY): 15 min  Charges:  $Gait Training: 8-22 mins                     Donna Bernard, PT, MPT    Ina Homes 01/11/2022, 11:50 AM

## 2022-01-11 NOTE — TOC Progression Note (Signed)
Transition of Care [facility]) - Progression Note    Patient Details  Name: Dale Edwards MRN: [id number] Date of Birth: May 11, 1956  Transition of Care [facility]) CM/SW Contact  Baldemar Lenis, [place] Phone Number: 01/11/2022, 3:44 PM  Clinical Narrative:   Patient only has one bed offer in [place] (said he didn't want to go out of town). CSW confirmed that [facility] would have a bed for him, and requested initiation of insurance authorization. CSW attempted to meet with patient to update him of only one bed offer but patient was asleep. CSW to follow.    Expected Discharge Plan: Homeless Shelter Barriers to Discharge: Continued Medical Work up  Expected Discharge Plan and Services Expected Discharge Plan: Homeless Shelter   Discharge Planning Services: CM Consult   Living arrangements for the past 2 months: Homeless                                       Social Determinants of Health (SDOH) Interventions    Readmission Risk Interventions     No data to display

## 2022-01-11 NOTE — Care Management Important Message (Signed)
Important Message  Patient Details  Name: Dale Edwards MRN: [id number] Date of Birth: 17-Sep-1955   Medicare Important Message Given:  Yes     Steffani Dionisio Stefan [facility] 01/11/2022, 9:21 AM

## 2022-01-12 LAB — URINALYSIS, ROUTINE W REFLEX MICROSCOPIC
Bilirubin Urine: NEGATIVE
Glucose, UA: NEGATIVE mg/dL
Hgb urine dipstick: NEGATIVE
Ketones, ur: NEGATIVE mg/dL
Leukocytes,Ua: NEGATIVE
Nitrite: NEGATIVE
Protein, ur: NEGATIVE mg/dL
Specific Gravity, Urine: 1.017 (ref 1.005–1.030)
pH: 6 (ref 5.0–8.0)

## 2022-01-12 NOTE — Progress Notes (Addendum)
Bed alarm was sounding; staff responded to room; patient sitting on side of the bed; he reports, " I fell going to the bathroom and got myself back up; I sat on my ass". Patient denies hitting his head, arms, or hips; won't describe how he fell down. This was an unwitnessed fall. Patient denies pain. Now sitting at side of the bed eating breakfast; vital signs will be obtained and MD will be notified of event. He reports the reason is frequent voiding and he can not wait; refuses to use a urinal; will f/u with MD.

## 2022-01-12 NOTE — Progress Notes (Signed)
Patient ID: Dale Edwards, male   DOB: 04/03/1956, 66 y.o.   MRN: [id number] Vital signs are stable Mr. Shannahan had a fall early this morning when he went up to get from the bathroom No apparent injury is occurred His motor function appears good Continue to observe him clinically

## 2022-01-12 NOTE — Plan of Care (Signed)
  Problem: Education: Goal: Knowledge of General Education information will improve Description: Including pain rating scale, medication(s)/side effects and non-pharmacologic comfort measures Outcome: Progressing   Problem: Health Behavior/Discharge Planning: Goal: Ability to manage health-related needs will improve Outcome: Progressing   Problem: Clinical Measurements: Goal: Ability to maintain clinical measurements within normal limits will improve Outcome: Progressing Goal: Will remain free from infection Outcome: Progressing Goal: Diagnostic test results will improve Outcome: Progressing Goal: Respiratory complications will improve Outcome: Progressing Goal: Cardiovascular complication will be avoided Outcome: Progressing   Problem: Activity: Goal: Risk for activity intolerance will decrease Outcome: Progressing   Problem: Nutrition: Goal: Adequate nutrition will be maintained Outcome: Progressing   Problem: Coping: Goal: Level of anxiety will decrease Outcome: Progressing   Problem: Elimination: Goal: Will not experience complications related to bowel motility Outcome: Progressing Goal: Will not experience complications related to urinary retention Outcome: Progressing   Problem: Pain Managment: Goal: General experience of comfort will improve Outcome: Progressing   Problem: Safety: Goal: Ability to remain free from injury will improve Outcome: Progressing   Problem: Skin Integrity: Goal: Risk for impaired skin integrity will decrease Outcome: Progressing   Problem: Education: Goal: Knowledge of the prescribed therapeutic regimen will improve Outcome: Progressing   Problem: Clinical Measurements: Goal: Usual level of consciousness will be regained or maintained. Outcome: Progressing Goal: Neurologic status will improve Outcome: Progressing Goal: Ability to maintain intracranial pressure will improve Outcome: Progressing   Problem: Skin  Integrity: Goal: Demonstration of wound healing without infection will improve Outcome: Progressing   

## 2022-01-12 NOTE — Progress Notes (Signed)
Dr. Danielle Dess returned page; patient in no acute distress; continue to monitor and maintain safety and alarms; will obtain UA and culture with patient  report of frequent urination.

## 2022-01-13 LAB — URINE CULTURE: Culture: NO GROWTH

## 2022-01-13 NOTE — Progress Notes (Signed)
Patient ID: Dale Edwards, male   DOB: 03-May-1956, 66 y.o.   MRN: [id number] Vital signs are stable Alert and oriented No further falls since yesterday Continue supportive care

## 2022-01-14 ENCOUNTER — Encounter (HOSPITAL_COMMUNITY): Payer: Self-pay

## 2022-01-14 NOTE — TOC Transition Note (Signed)
Transition of Care (TOC) - CM/SW Discharge Note   Patient Details  Name: [name] MRN: [id number] Date of Birth: 12/24/1955  Transition of Care (TOC) CM/SW Contact:   M , LCSW Phone Number: 01/14/2022, 11:28 AM   Clinical Narrative:   CSW obtained insurance approval for patient to admit to SNF, updated MD who discharged patient. CSW met with patient to update on plan to discharge and patient in agreement. CSW sent discharge to [facility], they are ready for patient. Transport scheduled with [facility] for after 12:30 so RN can remove staples.  RN to call report to [phone].    Final next level of care: Skilled Nursing Facility Barriers to Discharge: Barriers Resolved   Patient Goals and CMS Choice        Discharge Placement              Patient chooses bed at:  ([facility]) Patient to be transferred to facility by: [facility] Name of family member notified: Self Patient and family notified of of transfer: 01/14/22  Discharge Plan and Services   Discharge Planning Services: CM Consult                                 Social Determinants of Health (SDOH) Interventions     Readmission Risk Interventions     No data to display             

## 2022-01-14 NOTE — Progress Notes (Signed)
Report called to [facility], All questions answered. IV removed. Patient discharge packet sent with [facility] for receiving facility.  Melony Overly, RN

## 2022-01-14 NOTE — Progress Notes (Signed)
22 staples have been removed.

## 2022-01-14 NOTE — Discharge Summary (Signed)
Physician Discharge Summary  Patient ID: Dale Edwards MRN: [id number] DOB/AGE: 1955/09/26 65 y.o.  Admit date: 01/02/2022 Discharge date: 01/14/2022  Admission Diagnoses:  Right chronic subdural hematoma    Discharge Diagnoses: same   Discharged Condition: good  Hospital Course: The patient was admitted on 01/02/2022 and taken to the operating room where the patient underwent right crani for evacuation of sdh . The patient tolerated the procedure well and was taken to the recovery room and then to the ICU in stable condition. The hospital course was routine. There were no complications. The wound remained clean dry and intact. Pt had appropriate head soreness. No complaints of new pain or new N/T/W. The patient remained afebrile with stable vital signs, and tolerated a regular diet. The patient continued to increase activities, and pain was well controlled with oral pain medications.   Consults: None  Significant Diagnostic Studies:  Results for orders placed or performed during the hospital encounter of 01/02/22  Surgical pcr screen   Specimen: Nasal Mucosa; Nasal Swab  Result Value Ref Range   MRSA, PCR NEGATIVE NEGATIVE   Staphylococcus aureus POSITIVE (A) NEGATIVE  Urine Culture   Specimen: Urine, Clean Catch  Result Value Ref Range   Specimen Description URINE, CLEAN CATCH    Special Requests NONE    Culture      NO GROWTH Performed at [facility], [address] [place], [place] [address]    Report Status 01/13/2022 FINAL   Protime-INR  Result Value Ref Range   Prothrombin Time 13.7 11.4 - 15.2 seconds   INR 1.1 0.8 - 1.2  APTT  Result Value Ref Range   aPTT 28 24 - 36 seconds  CBC with Differential/Platelet  Result Value Ref Range   WBC 6.3 4.0 - 10.5 K/uL   RBC 4.58 4.22 - 5.81 MIL/uL   Hemoglobin 15.2 13.0 - 17.0 g/dL   HCT 62.1 30.8 - 65.7 %   MCV 96.5 80.0 - 100.0 fL   MCH 33.2 26.0 - 34.0 pg   MCHC 34.4 30.0 - 36.0 g/dL   RDW 84.6 96.2 - 95.2 %    Platelets 285 150 - 400 K/uL   nRBC 0.0 0.0 - 0.2 %   Neutrophils Relative % 58 %   Neutro Abs 3.7 1.7 - 7.7 K/uL   Lymphocytes Relative 26 %   Lymphs Abs 1.7 0.7 - 4.0 K/uL   Monocytes Relative 9 %   Monocytes Absolute 0.6 0.1 - 1.0 K/uL   Eosinophils Relative 4 %   Eosinophils Absolute 0.3 0.0 - 0.5 K/uL   Basophils Relative 2 %   Basophils Absolute 0.1 0.0 - 0.1 K/uL   Immature Granulocytes 1 %   Abs Immature Granulocytes 0.03 0.00 - 0.07 K/uL  Comprehensive metabolic panel  Result Value Ref Range   Sodium 136 135 - 145 mmol/L   Potassium 3.5 3.5 - 5.1 mmol/L   Chloride 105 98 - 111 mmol/L   CO2 24 22 - 32 mmol/L   Glucose, Bld 127 (H) 70 - 99 mg/dL   BUN 15 8 - 23 mg/dL   Creatinine, Ser 8.41 0.61 - 1.24 mg/dL   Calcium 8.3 (L) 8.9 - 10.3 mg/dL   Total Protein 5.2 (L) 6.5 - 8.1 g/dL   Albumin 2.7 (L) 3.5 - 5.0 g/dL   AST 16 15 - 41 U/L   ALT 20 0 - 44 U/L   Alkaline Phosphatase 53 38 - 126 U/L   Total Bilirubin 0.7 0.3 -  1.2 mg/dL   GFR, Estimated >76 >73 mL/min   Anion gap 7 5 - 15  Magnesium  Result Value Ref Range   Magnesium 1.9 1.7 - 2.4 mg/dL  C-reactive protein  Result Value Ref Range   CRP 1.0 (H) <1.0 mg/dL  Vitamin A  Result Value Ref Range   Vitamin A (Retinoic Acid) 26.2 22.0 - 69.5 ug/dL  Vitamin B6  Result Value Ref Range   Vitamin B6 9.2 3.4 - 65.2 ug/L  VITAMIN D 25 Hydroxy (Vit-D Deficiency, Fractures)  Result Value Ref Range   Vit D, 25-Hydroxy 13.10 (L) 30 - 100 ng/mL  Vitamin E  Result Value Ref Range   Vitamin E (Alpha Tocopherol) 8.1 (L) 9.0 - 29.0 mg/L   Vitamin E(Gamma Tocopherol) 1.5 0.5 - 4.9 mg/L  Vitamin K1, Serum  Result Value Ref Range   VITAMIN K1 0.73 0.10 - 2.20 ng/mL  Vitamin B12  Result Value Ref Range   Vitamin B-12 173 (L) 180 - 914 pg/mL  Folate  Result Value Ref Range   Folate 16.7 >5.9 ng/mL  Iron and TIBC  Result Value Ref Range   Iron 62 45 - 182 ug/dL   TIBC 419 379 - 024 ug/dL   Saturation Ratios 22  17.9 - 39.5 %   UIBC 224 ug/dL  Ferritin  Result Value Ref Range   Ferritin 372 (H) 24 - 336 ng/mL  Reticulocytes  Result Value Ref Range   Retic Ct Pct 1.3 0.4 - 3.1 %   RBC. 4.66 4.22 - 5.81 MIL/uL   Retic Count, Absolute 60.1 19.0 - 186.0 K/uL   Immature Retic Fract 5.7 2.3 - 15.9 %  Vitamin C  Result Value Ref Range   Vitamin C 0.2 (L) 0.4 - 2.0 mg/dL  Zinc  Result Value Ref Range   Zinc 57 44 - 115 ug/dL  Basic metabolic panel  Result Value Ref Range   Sodium 137 135 - 145 mmol/L   Potassium 4.0 3.5 - 5.1 mmol/L   Chloride 107 98 - 111 mmol/L   CO2 26 22 - 32 mmol/L   Glucose, Bld 116 (H) 70 - 99 mg/dL   BUN 19 8 - 23 mg/dL   Creatinine, Ser 0.97 0.61 - 1.24 mg/dL   Calcium 8.3 (L) 8.9 - 10.3 mg/dL   GFR, Estimated >35 >32 mL/min   Anion gap 4 (L) 5 - 15  CBC  Result Value Ref Range   WBC 20.3 (H) 4.0 - 10.5 K/uL   RBC 4.36 4.22 - 5.81 MIL/uL   Hemoglobin 14.5 13.0 - 17.0 g/dL   HCT 99.2 42.6 - 83.4 %   MCV 94.7 80.0 - 100.0 fL   MCH 33.3 26.0 - 34.0 pg   MCHC 35.1 30.0 - 36.0 g/dL   RDW 19.6 22.2 - 97.9 %   Platelets 286 150 - 400 K/uL   nRBC 0.0 0.0 - 0.2 %  Basic metabolic panel  Result Value Ref Range   Sodium 135 135 - 145 mmol/L   Potassium 4.6 3.5 - 5.1 mmol/L   Chloride 108 98 - 111 mmol/L   CO2 21 (L) 22 - 32 mmol/L   Glucose, Bld 174 (H) 70 - 99 mg/dL   BUN 14 8 - 23 mg/dL   Creatinine, Ser 8.92 0.61 - 1.24 mg/dL   Calcium 8.4 (L) 8.9 - 10.3 mg/dL   GFR, Estimated >11 >94 mL/min   Anion gap 6 5 - 15  Basic metabolic panel  Result  Value Ref Range   Sodium 137 135 - 145 mmol/L   Potassium 4.4 3.5 - 5.1 mmol/L   Chloride 106 98 - 111 mmol/L   CO2 25 22 - 32 mmol/L   Glucose, Bld 131 (H) 70 - 99 mg/dL   BUN 22 8 - 23 mg/dL   Creatinine, Ser 1.61 0.61 - 1.24 mg/dL   Calcium 8.3 (L) 8.9 - 10.3 mg/dL   GFR, Estimated >09 >60 mL/min   Anion gap 6 5 - 15  Magnesium  Result Value Ref Range   Magnesium 2.1 1.7 - 2.4 mg/dL  Urinalysis, Routine w  reflex microscopic Urine, Clean Catch  Result Value Ref Range   Color, Urine YELLOW YELLOW   APPearance CLEAR CLEAR   Specific Gravity, Urine 1.017 1.005 - 1.030   pH 6.0 5.0 - 8.0   Glucose, UA NEGATIVE NEGATIVE mg/dL   Hgb urine dipstick NEGATIVE NEGATIVE   Bilirubin Urine NEGATIVE NEGATIVE   Ketones, ur NEGATIVE NEGATIVE mg/dL   Protein, ur NEGATIVE NEGATIVE mg/dL   Nitrite NEGATIVE NEGATIVE   Leukocytes,Ua NEGATIVE NEGATIVE  Type and screen  Result Value Ref Range   ABO/RH(D) A POS    Antibody Screen NEG    Sample Expiration      01/09/2022,2359 Performed at [facility], [address], [place], [place] [address]     CT HEAD WO CONTRAST ( )  Result Date: 01/09/2022 CLINICAL DATA:  66 year old male postoperative day 3 status post right craniotomy, subdural hematoma evacuation. EXAM: CT HEAD WITHOUT CONTRAST TECHNIQUE: Contiguous axial images were obtained from the base of the skull through the vertex without intravenous contrast. RADIATION DOSE REDUCTION: This exam was performed according to the departmental dose-optimization program which includes automated exposure control, adjustment of the mA and/or kV according to patient size and/or use of iterative reconstruction technique. COMPARISON:  Head CT 01/07/2022 and earlier. FINDINGS: Brain: Percutaneous subdural drain remains in place on the right. Underlying mixed density right superior convexity subdural hematoma with a small volume of postoperative pneumocephalus has decreased from about 19 mm in thickness to 15 mm now (coronal image 36). Additional right side para falcine blood anteriorly is stable and small volume. Contralateral lobulated and mostly hyperdense left side subdural hematoma is stable measuring up to 14 mm in thickness (coronal image 25). Stable mass effect on the superior cerebral convexities with maintained normal basilar cisterns and only trace rightward midline shift. No ventriculomegaly or IVH. No new  areas of intracranial hemorrhage identified. Patchy and confluent hypodensity in the bilateral cerebral white matter, right basal ganglia are stable. No cortically based acute infarct identified. Vascular: Calcified atherosclerosis at the skull base. Skull: Right parietal craniotomy. No acute osseous abnormality identified. Sinuses/Orbits: Unchanged left mastoid opacification. Other Visualized paranasal sinuses and mastoids are stable and well aerated. Other: Mildly Disconjugate gaze, otherwise negative orbits. Stable postoperative changes to the superior right scalp convexity. Skin staples remain in place. IMPRESSION: 1. Right side subdural drain remains in place with superior convexity subdural hematoma on that side decreased to 15 mm thickness now. 2. Contralateral lobulated left superior convexity subdural is stable measuring up to 14 mm. 3. Stable mass effect on the superior cerebral convexities, with normal basilar cisterns and only trace rightward midline shift. 4. No new intracranial abnormality. Electronically Signed   By: Odessa Fleming M.D.   On: 01/09/2022 06:15   CT HEAD WO CONTRAST  Result Date: 01/07/2022 CLINICAL DATA:  Subdural hematoma EXAM: CT HEAD WITHOUT CONTRAST TECHNIQUE: Contiguous axial  images were obtained from the base of the skull through the vertex without intravenous contrast. RADIATION DOSE REDUCTION: This exam was performed according to the departmental dose-optimization program which includes automated exposure control, adjustment of the mA and/or kV according to patient size and/or use of iterative reconstruction technique. COMPARISON:  01/06/2022 FINDINGS: Brain: Mixed density subdural hematoma on the right with prior evacuation and drain placement. Increased thickness measured at 18 mm along the right parietal convexity, previously approximately 10 mm. Increased mass effect on the right frontal parietal cortex. Mixed density subdural hematoma along the left cerebral convexity with  high-density component measuring up to 16 mm in thickness, previously 12 mm. Midline shift measures up to 3 mm. No evidence of complicating infarct or hydrocephalus. Brain atrophy and chronic small vessel ischemia. Vascular: No hyperdense vessel or unexpected calcification. Skull: Unremarkable craniotomy on the right Sinuses/Orbits: Partial bilateral mastoid opacification with under pneumatization on the left. IMPRESSION: 1. Increased thickness of bilateral subdural hematoma, high-density clot measuring up to 18 mm thickness on the right and 10 mm on the left. 2. 3 mm of rightward midline shift. Electronically Signed   By: Tiburcio Pea M.D.   On: 01/07/2022 06:03   CT HEAD WO CONTRAST ( )  Result Date: 01/06/2022 CLINICAL DATA:  Subdural hematoma. EXAM: CT HEAD WITHOUT CONTRAST TECHNIQUE: Contiguous axial images were obtained from the base of the skull through the vertex without intravenous contrast. RADIATION DOSE REDUCTION: This exam was performed according to the departmental dose-optimization program which includes automated exposure control, adjustment of the mA and/or kV according to patient size and/or use of iterative reconstruction technique. COMPARISON:  Head CT 01/05/2022 FINDINGS: Brain: Sequelae of interval right parietal craniotomy for subdural hematoma evacuation are identified. A subdural drain, Gel-Foam, and small volume pneumocephalus are noted. Residual mixed density subdural fluid collection/hematoma over the right cerebral convexity measures up to 1 cm in thickness. Mass effect on the right cerebral hemisphere has greatly decreased, and midline shift has resolved. A pre-existing small subdural collection over the left cerebral convexity has mildly enlarged and now contains hyperdense blood products consistent with interval hemorrhage, and this collection now measures up to 1.2 cm in thickness with mild mass effect on the left frontal and parietal lobes. A small amount of new/acute  subdural hemorrhage is also noted along the falx measuring up to 5 mm in thickness. No acute infarct or hydrocephalus is evident. Patchy hypodensities in the cerebral white matter bilaterally are similar to the prior CT and are nonspecific but compatible with moderate chronic small vessel ischemic disease. Vascular: Calcified atherosclerosis at the skull base. No hyperdense vessel. Skull: Right parietal craniotomy.  Overlying skin staples. Sinuses/Orbits: Clear paranasal sinuses. Bilateral mastoid effusions. Unremarkable orbits. Other: None. IMPRESSION: 1. Interval right-sided subdural hematoma evacuation. Residual mixed density subdural collection as above with greatly decreased mass effect and resolved midline shift. 2. New acute hemorrhage within a pre-existing small left-sided subdural hematoma and along the falx. Only mild associated mass effect. 3. Moderate chronic small vessel ischemic disease. These results will be called to the ordering clinician or representative by the Radiologist Assistant, and communication documented in the PACS or [employer]. Electronically Signed   By: Sebastian Ache M.D.   On: 01/06/2022 17:53   CT HEAD WO CONTRAST ( )  Result Date: 01/05/2022 CLINICAL DATA:  Follow-up subdural hematoma EXAM: CT HEAD WITHOUT CONTRAST TECHNIQUE: Contiguous axial images were obtained from the base of the skull through the vertex without intravenous contrast. RADIATION DOSE REDUCTION: This  exam was performed according to the departmental dose-optimization program which includes automated exposure control, adjustment of the mA and/or kV according to patient size and/or use of iterative reconstruction technique. COMPARISON:  3 days ago FINDINGS: Brain: Low but not CSF density subdural collection with signs of septation along the right cerebral convexity, up to 3 cm in thickness with prominent right cerebral mass effect. Midline shift measures up to 8 mm. No entrapment. Trace and more  low-density subdural collection along the left cerebral convexity. Chronic white matter disease. No evidence of acute infarct. Vascular: No hyperdense vessel or unexpected calcification. Skull: Normal. Negative for fracture or focal lesion. Sinuses/Orbits: No acute finding. Chronic partial mastoid opacification. IMPRESSION: 1. Unchanged nonacute subdural hematoma along the right cerebral convexity with septations. Thickness measures up to 3 cm and there is 8 mm of midline shift. 2. Trace and more simple subdural collection along the left cerebral convexity. Electronically Signed   By: Tiburcio Pea M.D.   On: 01/05/2022 08:13   CT Head Wo Contrast  Result Date: 01/02/2022 CLINICAL DATA:  Head trauma, moderate-severe EXAM: CT HEAD WITHOUT CONTRAST TECHNIQUE: Contiguous axial images were obtained from the base of the skull through the vertex without intravenous contrast. RADIATION DOSE REDUCTION: This exam was performed according to the departmental dose-optimization program which includes automated exposure control, adjustment of the mA and/or kV according to patient size and/or use of iterative reconstruction technique. COMPARISON:  None Available. FINDINGS: Brain: Mildly hypodense right cerebral convexity subdural hematoma is present measuring up to 2.8 cm in thickness. Much thinner subdural hematoma is present on the left measuring up to 5 mm in thickness. There is leftward midline shift measuring 8 mm at the level of the septum pellucidum. Prominence of the ventricles and sulci reflects parenchymal volume loss. Patchy and confluent areas of low-density in the supratentorial white matter nonspecific but probably reflect stable chronic microvascular ischemic changes. There is no hydrocephalus. Vascular: There is atherosclerotic calcification at the skull base. Skull: Calvarium is unremarkable. Sinuses/Orbits: No acute finding. Other: Patchy left greater than right mastoid opacification. IMPRESSION: Right much  larger than left cerebral convexity mildly hypodense subdural hematomas. There is mass effect on the underlying parenchyma with leftward midline shift measuring 8 mm. Absence of hyperdense hemorrhage suggests this is more subacute or alternatively, this could be acute if there is an underlying coagulopathy. Electronically Signed   By: Guadlupe Spanish M.D.   On: 01/02/2022 19:50   DG Hip Unilat W or Wo Pelvis 2-3 Views Right  Result Date: 01/02/2022 CLINICAL DATA:  RIGHT hip pain post fall EXAM: DG HIP (WITH OR WITHOUT PELVIS) 2-3V RIGHT COMPARISON:  12/27/2021 FINDINGS: Osseous mineralization low normal. Mild narrowing of RIGHT hip joint with mild spur formation at the RIGHT femoral head. SI joints and LEFT hip joint space preserved. No fracture, dislocation, or bone destruction. IMPRESSION: Mild degenerative changes RIGHT hip joint without acute osseous abnormalities. Electronically Signed   By: Ulyses Southward M.D.   On: 01/02/2022 15:54   DG Hip Unilat W or Wo Pelvis 2-3 Views Left  Result Date: 01/02/2022 CLINICAL DATA:  Fall with bilateral hip pain. EXAM: DG HIP (WITH OR WITHOUT PELVIS) 2-3V LEFT COMPARISON:  Left hip x-ray 12/27/2021 FINDINGS: There is no evidence of hip fracture or dislocation. There are mild degenerative changes of both hips, similar to the prior study. Soft tissues are within normal limits. IMPRESSION: No acute osseous abnormality. Electronically Signed   By: Darliss Cheney M.D.   On: 01/02/2022  15:53   DG HIP UNILAT WITH PELVIS 2-3 VIEWS LEFT  Result Date: 12/27/2021 CLINICAL DATA:  Hip pain EXAM: DG HIP (WITH OR WITHOUT PELVIS) 2-3V LEFT COMPARISON:  None Available. FINDINGS: No evidence of fracture or malalignment. Mild bilateral hip joint degenerative osteoarthritis greater on the left than the right. Osteophyte formation present at the lateral acetabulum. No lytic or blastic osseous lesion. 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 Electronically  Signed   By: Malachy Moan M.D.   On: 12/27/2021 16:37   DG HIP UNILAT WITH PELVIS 2-3 VIEWS RIGHT  Result Date: 12/27/2021 CLINICAL DATA:  Not available EXAM: DG HIP (WITH OR WITHOUT PELVIS) 2-3V RIGHT COMPARISON:  None Available. FINDINGS: There is no evidence of hip fracture or dislocation. Mild hip joint space narrowing with acetabular lip spurring. IMPRESSION: No evidence of acute fracture or dislocation. Electronically Signed   By: Larose Hires D.O.   On: 12/27/2021 15:42    Antibiotics:  Anti-infectives (From admission, onward)    Start     Dose/Rate Route Frequency Ordered Stop   01/06/22 2315  ceFAZolin (ANCEF) IVPB 1 g/50 mL premix        1 g 100 mL/hr over 30 Minutes Intravenous Every 8 hours 01/06/22 1734 01/07/22 0814   01/06/22 0600  ceFAZolin (ANCEF) IVPB 2g/100 mL premix        2 g 200 mL/hr over 30 Minutes Intravenous On call to O.R. 01/06/22 0353 01/06/22 1547       Discharge Exam: Blood pressure (!) 86/47, pulse 65, temperature 98 F (36.7 C), temperature source Oral, resp. rate 15, height 5\' 6"  (1.676 m), weight 68.8 kg, SpO2 99 %. Neurologic: Grossly normal Ambulating and voiding well incision cdi   Discharge Medications:   Allergies as of 01/14/2022       Reactions   Hydrocodone-acetaminophen Itching, Nausea Only            Medication List     TAKE these medications    acetaminophen 500 MG tablet Commonly known as: TYLENOL Take 2 tablets (1,000 mg total) by mouth every 6 (six) hours as needed for mild pain or moderate pain.   albuterol 108 (90 Base) MCG/ACT inhaler Commonly known as: VENTOLIN HFA Inhale 2 puffs into the lungs every 6 (six) hours as needed for wheezing or shortness of breath.   cyclobenzaprine 10 MG tablet Commonly known as: FLEXERIL Take 1 tablet (10 mg total) by mouth 2 (two) times daily as needed for muscle spasms.   Symbicort 160-4.5 MCG/ACT inhaler Generic drug: budesonide-formoterol Inhale 2 puffs into the lungs in  the morning and at bedtime.        Disposition: SNF   Final Dx: right chronic subdural hematoma  **Follow up 1 week with a ct head without contrast   Discharge Instructions     Call MD for:  difficulty breathing, headache or visual disturbances   Complete by: As directed    Call MD for:  hives   Complete by: As directed    Call MD for:  persistant dizziness or light-headedness   Complete by: As directed    Call MD for:  persistant nausea and vomiting   Complete by: As directed    Call MD for:  redness, tenderness, or signs of infection (pain, swelling, redness, odor or green/yellow discharge around incision site)   Complete by: As directed    Call MD for:  severe uncontrolled pain   Complete by: As directed  Call MD for:  temperature >100.4   Complete by: As directed    Diet - low sodium heart healthy   Complete by: As directed    Increase activity slowly   Complete by: As directed    No wound care   Complete by: As directed         Contact information for follow-up providers     Tia Alert, MD. Schedule an appointment as soon as possible for a visit in 1 week(s).   Specialty: Neurosurgery Contact information: [address] [place] [address] [phone]              Contact information for after-discharge care     Destination     [facility] .   Service: Skilled Nursing Contact information: [address] [place] [address] [phone]                      Signed: Tiana Loft [facility] 01/14/2022, 10:19 AM

## 2022-01-15 ENCOUNTER — Other Ambulatory Visit: Payer: Self-pay | Admitting: Neurological Surgery

## 2022-01-15 ENCOUNTER — Other Ambulatory Visit (HOSPITAL_COMMUNITY): Payer: Self-pay | Admitting: Neurological Surgery

## 2022-01-15 DIAGNOSIS — S065XAA Traumatic subdural hemorrhage with loss of consciousness status unknown, initial encounter: Secondary | ICD-10-CM

## 2022-01-21 ENCOUNTER — Ambulatory Visit (HOSPITAL_COMMUNITY)
Admission: RE | Admit: 2022-01-21 | Discharge: 2022-01-21 | Disposition: A | Discharge status: Home / Self Care | Payer: Medicare Other | Source: Ambulatory Visit | Attending: Neurological Surgery | Admitting: Neurological Surgery

## 2022-01-21 DIAGNOSIS — S065XAA Traumatic subdural hemorrhage with loss of consciousness status unknown, initial encounter: Secondary | ICD-10-CM | POA: Insufficient documentation

## 2022-05-16 LAB — COLOGUARD

## 2022-05-16 LAB — EXTERNAL GENERIC LAB PROCEDURE

## 2023-05-21 ENCOUNTER — Telehealth: Payer: Self-pay

## 2023-05-21 NOTE — Telephone Encounter (Signed)
Called the only number on file and they stated that the number no longer belongs to him and multiple people have called looking for the pt. Routing to front desk staff to see if they can remove   [phone] (Primary)  As I do not know how and to route back to me when finished.

## 2023-05-21 NOTE — Telephone Encounter (Signed)
-----   Message from [name] sent at 05/21/2023  6:34 AM EDT ----- Regarding: FW: Lumbar radiculopathy and MS Per Dr. Terrace Arabia please set up virtual visit for Dale Edwards for a f/u. ----- Message ----- From: [name] Sent: 05/20/2023   4:57 PM EDT To: [name] Subject: RE: Lumbar radiculopathy and MS                Unclear as to what is being requested of [facility] phone room, please advise. ----- Message ----- From: [name] Sent: 05/20/2023   4:36 PM EDT To: [facility] Phone Room Subject: FW: Lumbar radiculopathy and MS                 ----- Message ----- From: Levert Feinstein, MD Sent: 05/20/2023   4:15 PM EDT To: [name], FNP; [name]; # Subject: RE: Lumbar radiculopathy and MS                 I saw him last in 2022, ordered repeat MRIs, but Dale Edwards did not carry through.  May arrange Virtual visit to begin with. ----- Message ----- From: [name], FNP Sent: 05/18/2023   8:32 PM EDT To: Levert Feinstein, MD Subject: Lumbar radiculopathy and MS                    Dr Terrace Arabia, This Dale Edwards is a resident of [facility] skilled nursing facility.  You had previously treated him for MS with Tysabri infusions.  I don't remember there being a lifetime limit on those infusions but at any rate you have not seen him in over a year.  He was wanting to follow up with you if we can arrange transportation.  He has had a 3 week course of radiculopathy on his right side.  From your notes it looks like you were considering if he had some increase in symptoms treatment with Ocrevus. It appears from past imaging he had some spinal involvement of upper thoracic and cervical spine.  I am sending my note to you for review.  My initial plan right now would be a course of steroids and PT.  Would you be willing to re-evaluate him and if you think lumbar imaging would be beneficial let me know.  Respectfully, Joycelyn Man FNP-C [facility] Cell [phone]

## 2023-05-21 NOTE — Telephone Encounter (Signed)
Attempted to call the number listed on dpr but its not an active number either.  Routing this to Dr. Terrace Arabia to let her know we tried to reach out aand to ask what she recommends that we do.

## 2023-05-21 NOTE — Telephone Encounter (Signed)
Pod 2 try calling this on dpr:

## 2023-05-21 NOTE — Telephone Encounter (Signed)
Our office fail to reach him by multiple attempts, you may inform patient reach out to [place] for follow up visit.

## 2023-06-09 ENCOUNTER — Other Ambulatory Visit (HOSPITAL_COMMUNITY): Payer: Self-pay

## 2023-08-19 ENCOUNTER — Other Ambulatory Visit: Payer: Self-pay

## 2023-08-19 ENCOUNTER — Inpatient Hospital Stay (HOSPITAL_COMMUNITY)
Admission: EM | Admit: 2023-08-19 | Discharge: 2023-08-21 | DRG: 193 | Disposition: A | Discharge status: Skilled Nursing Facility | Payer: Medicare (Managed Care) | Source: Skilled Nursing Facility | Attending: Internal Medicine | Admitting: Internal Medicine

## 2023-08-19 ENCOUNTER — Inpatient Hospital Stay (HOSPITAL_COMMUNITY): Payer: Medicare (Managed Care)

## 2023-08-19 ENCOUNTER — Emergency Department (HOSPITAL_COMMUNITY): Payer: Medicare (Managed Care)

## 2023-08-19 ENCOUNTER — Encounter (HOSPITAL_COMMUNITY): Payer: Self-pay | Admitting: Internal Medicine

## 2023-08-19 DIAGNOSIS — Z716 Tobacco abuse counseling: Secondary | ICD-10-CM

## 2023-08-19 DIAGNOSIS — R5381 Other malaise: Secondary | ICD-10-CM | POA: Diagnosis present

## 2023-08-19 DIAGNOSIS — F17210 Nicotine dependence, cigarettes, uncomplicated: Secondary | ICD-10-CM | POA: Diagnosis present

## 2023-08-19 DIAGNOSIS — W1830XA Fall on same level, unspecified, initial encounter: Secondary | ICD-10-CM | POA: Diagnosis present

## 2023-08-19 DIAGNOSIS — Z8679 Personal history of other diseases of the circulatory system: Secondary | ICD-10-CM

## 2023-08-19 DIAGNOSIS — Z79899 Other long term (current) drug therapy: Secondary | ICD-10-CM | POA: Diagnosis not present

## 2023-08-19 DIAGNOSIS — Z7951 Long term (current) use of inhaled steroids: Secondary | ICD-10-CM

## 2023-08-19 DIAGNOSIS — R7989 Other specified abnormal findings of blood chemistry: Secondary | ICD-10-CM | POA: Diagnosis present

## 2023-08-19 DIAGNOSIS — Y92121 Bathroom in nursing home as the place of occurrence of the external cause: Secondary | ICD-10-CM | POA: Diagnosis not present

## 2023-08-19 DIAGNOSIS — J101 Influenza due to other identified influenza virus with other respiratory manifestations: Principal | ICD-10-CM | POA: Diagnosis present

## 2023-08-19 DIAGNOSIS — M545 Low back pain, unspecified: Secondary | ICD-10-CM | POA: Diagnosis present

## 2023-08-19 DIAGNOSIS — W2209XA Striking against other stationary object, initial encounter: Secondary | ICD-10-CM | POA: Diagnosis present

## 2023-08-19 DIAGNOSIS — R296 Repeated falls: Secondary | ICD-10-CM | POA: Diagnosis present

## 2023-08-19 DIAGNOSIS — Z66 Do not resuscitate: Secondary | ICD-10-CM | POA: Diagnosis present

## 2023-08-19 DIAGNOSIS — E8721 Acute metabolic acidosis: Secondary | ICD-10-CM | POA: Diagnosis present

## 2023-08-19 DIAGNOSIS — J9601 Acute respiratory failure with hypoxia: Secondary | ICD-10-CM | POA: Diagnosis present

## 2023-08-19 DIAGNOSIS — Z885 Allergy status to narcotic agent status: Secondary | ICD-10-CM | POA: Diagnosis not present

## 2023-08-19 DIAGNOSIS — Z1152 Encounter for screening for COVID-19: Secondary | ICD-10-CM

## 2023-08-19 DIAGNOSIS — R0902 Hypoxemia: Secondary | ICD-10-CM | POA: Diagnosis present

## 2023-08-19 DIAGNOSIS — I272 Pulmonary hypertension, unspecified: Secondary | ICD-10-CM | POA: Diagnosis present

## 2023-08-19 DIAGNOSIS — G35 Multiple sclerosis: Secondary | ICD-10-CM | POA: Diagnosis present

## 2023-08-19 DIAGNOSIS — J439 Emphysema, unspecified: Secondary | ICD-10-CM | POA: Diagnosis present

## 2023-08-19 DIAGNOSIS — J441 Chronic obstructive pulmonary disease with (acute) exacerbation: Secondary | ICD-10-CM | POA: Diagnosis present

## 2023-08-19 DIAGNOSIS — J811 Chronic pulmonary edema: Secondary | ICD-10-CM | POA: Diagnosis present

## 2023-08-19 DIAGNOSIS — R0602 Shortness of breath: Secondary | ICD-10-CM | POA: Diagnosis present

## 2023-08-19 LAB — D-DIMER, QUANTITATIVE: D-Dimer, Quant: 0.27 ug{FEU}/mL (ref 0.00–0.50)

## 2023-08-19 LAB — CBC
HCT: 47.1 % (ref 39.0–52.0)
Hemoglobin: 15.4 g/dL (ref 13.0–17.0)
MCH: 31.5 pg (ref 26.0–34.0)
MCHC: 32.7 g/dL (ref 30.0–36.0)
MCV: 96.3 fL (ref 80.0–100.0)
Platelets: 210 10*3/uL (ref 150–400)
RBC: 4.89 MIL/uL (ref 4.22–5.81)
RDW: 13 % (ref 11.5–15.5)
WBC: 6.6 10*3/uL (ref 4.0–10.5)
nRBC: 0 % (ref 0.0–0.2)

## 2023-08-19 LAB — COMPREHENSIVE METABOLIC PANEL
ALT: 41 U/L (ref 0–44)
AST: 119 U/L — ABNORMAL HIGH (ref 15–41)
Albumin: 3.4 g/dL — ABNORMAL LOW (ref 3.5–5.0)
Alkaline Phosphatase: 56 U/L (ref 38–126)
Anion gap: 11 (ref 5–15)
BUN: 16 mg/dL (ref 8–23)
CO2: 21 mmol/L — ABNORMAL LOW (ref 22–32)
Calcium: 8.5 mg/dL — ABNORMAL LOW (ref 8.9–10.3)
Chloride: 105 mmol/L (ref 98–111)
Creatinine, Ser: 0.98 mg/dL (ref 0.61–1.24)
GFR, Estimated: >60 mL/min (ref >60)
Glucose, Bld: 151 mg/dL — ABNORMAL HIGH (ref 70–99)
Potassium: 4.1 mmol/L (ref 3.5–5.1)
Sodium: 137 mmol/L (ref 135–145)
Total Bilirubin: 0.9 mg/dL (ref 0.0–1.2)
Total Protein: 6.2 g/dL — ABNORMAL LOW (ref 6.5–8.1)

## 2023-08-19 LAB — CBC WITH DIFFERENTIAL/PLATELET
Abs Immature Granulocytes: 0.02 10*3/uL (ref 0.00–0.07)
Basophils Absolute: 0.1 10*3/uL (ref 0.0–0.1)
Basophils Relative: 1 %
Eosinophils Absolute: 0.3 10*3/uL (ref 0.0–0.5)
Eosinophils Relative: 4 %
HCT: 48.4 % (ref 39.0–52.0)
Hemoglobin: 15.8 g/dL (ref 13.0–17.0)
Immature Granulocytes: 0 %
Lymphocytes Relative: 8 %
Lymphs Abs: 0.6 10*3/uL — ABNORMAL LOW (ref 0.7–4.0)
MCH: 31 pg (ref 26.0–34.0)
MCHC: 32.6 g/dL (ref 30.0–36.0)
MCV: 94.9 fL (ref 80.0–100.0)
Monocytes Absolute: 0.8 10*3/uL (ref 0.1–1.0)
Monocytes Relative: 11 %
Neutro Abs: 5.5 10*3/uL (ref 1.7–7.7)
Neutrophils Relative %: 76 %
Platelets: 202 10*3/uL (ref 150–400)
RBC: 5.1 MIL/uL (ref 4.22–5.81)
RDW: 12.9 % (ref 11.5–15.5)
WBC: 7.3 10*3/uL (ref 4.0–10.5)
nRBC: 0 % (ref 0.0–0.2)

## 2023-08-19 LAB — URINALYSIS, W/ REFLEX TO CULTURE (INFECTION SUSPECTED)
Bilirubin Urine: NEGATIVE
Glucose, UA: NEGATIVE mg/dL
Ketones, ur: 20 mg/dL — AB
Leukocytes,Ua: NEGATIVE
Nitrite: NEGATIVE
Protein, ur: 30 mg/dL — AB
Specific Gravity, Urine: >1.046 — ABNORMAL HIGH (ref 1.005–1.030)
pH: 5 (ref 5.0–8.0)

## 2023-08-19 LAB — BASIC METABOLIC PANEL
Anion gap: 11 (ref 5–15)
BUN: 17 mg/dL (ref 8–23)
CO2: 22 mmol/L (ref 22–32)
Calcium: 8.8 mg/dL — ABNORMAL LOW (ref 8.9–10.3)
Chloride: 105 mmol/L (ref 98–111)
Creatinine, Ser: 0.97 mg/dL (ref 0.61–1.24)
GFR, Estimated: >60 mL/min (ref >60)
Glucose, Bld: 117 mg/dL — ABNORMAL HIGH (ref 70–99)
Potassium: 4.2 mmol/L (ref 3.5–5.1)
Sodium: 138 mmol/L (ref 135–145)

## 2023-08-19 LAB — RESP PANEL BY RT-PCR (RSV, FLU A&B, COVID)  RVPGX2
Influenza A by PCR: POSITIVE — AB
Influenza B by PCR: NEGATIVE
Resp Syncytial Virus by PCR: NEGATIVE
SARS Coronavirus 2 by RT PCR: NEGATIVE

## 2023-08-19 LAB — HIV ANTIBODY (ROUTINE TESTING W REFLEX): HIV Screen 4th Generation wRfx: NONREACTIVE

## 2023-08-19 LAB — BRAIN NATRIURETIC PEPTIDE: B Natriuretic Peptide: 132.2 pg/mL — ABNORMAL HIGH (ref 0.0–100.0)

## 2023-08-19 LAB — MRSA NEXT GEN BY PCR, NASAL: MRSA by PCR Next Gen: DETECTED — AB

## 2023-08-19 LAB — LACTIC ACID, PLASMA: Lactic Acid, Venous: 1.1 mmol/L (ref 0.5–1.9)

## 2023-08-19 MED ORDER — ASPIRIN 81 MG PO TBEC
81.0000 mg | DELAYED_RELEASE_TABLET | Freq: Every day | ORAL | Status: DC
  Administered 2023-08-19 – 2023-08-21 (×3): 81 mg via ORAL
  Filled 2023-08-19 (×3): qty 1

## 2023-08-19 MED ORDER — METHYLPREDNISOLONE SODIUM SUCC 125 MG IJ SOLR
80.0000 mg | INTRAMUSCULAR | Status: AC
  Administered 2023-08-19: 80 mg via INTRAVENOUS
  Filled 2023-08-19: qty 2

## 2023-08-19 MED ORDER — PREDNISONE 20 MG PO TABS: 40.0000 mg | ORAL_TABLET | Freq: Every day | ORAL | Status: DC

## 2023-08-19 MED ORDER — VITAMIN D 25 MCG (1000 UNIT) PO TABS
1000.0000 [IU] | ORAL_TABLET | Freq: Every day | ORAL | Status: DC
  Administered 2023-08-19 – 2023-08-21 (×3): 1000 [IU] via ORAL
  Filled 2023-08-19 (×3): qty 1

## 2023-08-19 MED ORDER — IPRATROPIUM-ALBUTEROL 0.5-2.5 (3) MG/3ML IN SOLN
3.0000 mL | Freq: Once | RESPIRATORY_TRACT | Status: AC
  Administered 2023-08-19: 3 mL via RESPIRATORY_TRACT
  Filled 2023-08-19: qty 3

## 2023-08-19 MED ORDER — ACETAMINOPHEN 325 MG PO TABS
650.0000 mg | ORAL_TABLET | Freq: Four times a day (QID) | ORAL | Status: DC | PRN
  Administered 2023-08-19: 650 mg via ORAL
  Filled 2023-08-19: qty 2

## 2023-08-19 MED ORDER — HEPARIN SODIUM (PORCINE) 5000 UNIT/ML IJ SOLN: 5000.0000 [IU] | Freq: Three times a day (TID) | INTRAMUSCULAR | Status: DC

## 2023-08-19 MED ORDER — LEVALBUTEROL HCL 0.63 MG/3ML IN NEBU: 0.6300 mg | INHALATION_SOLUTION | Freq: Four times a day (QID) | RESPIRATORY_TRACT | Status: DC | PRN

## 2023-08-19 MED ORDER — CYCLOBENZAPRINE HCL 10 MG PO TABS
10.0000 mg | ORAL_TABLET | Freq: Two times a day (BID) | ORAL | Status: DC | PRN
  Administered 2023-08-19: 10 mg via ORAL
  Filled 2023-08-19: qty 1

## 2023-08-19 MED ORDER — ROSUVASTATIN CALCIUM 5 MG PO TABS
10.0000 mg | ORAL_TABLET | Freq: Every day | ORAL | Status: DC
  Administered 2023-08-19 – 2023-08-20 (×2): 10 mg via ORAL
  Filled 2023-08-19 (×2): qty 2

## 2023-08-19 MED ORDER — IOHEXOL 350 MG/ML SOLN
75.0000 mL | Freq: Once | INTRAVENOUS | Status: AC | PRN
  Administered 2023-08-19: 75 mL via INTRAVENOUS

## 2023-08-19 MED ORDER — OSELTAMIVIR PHOSPHATE 75 MG PO CAPS
75.0000 mg | ORAL_CAPSULE | Freq: Two times a day (BID) | ORAL | Status: DC
  Administered 2023-08-19 – 2023-08-21 (×4): 75 mg via ORAL
  Filled 2023-08-19 (×5): qty 1

## 2023-08-19 MED ORDER — GABAPENTIN 300 MG PO CAPS
300.0000 mg | ORAL_CAPSULE | Freq: Every day | ORAL | Status: DC
  Administered 2023-08-19 – 2023-08-20 (×2): 300 mg via ORAL
  Filled 2023-08-19 (×2): qty 1

## 2023-08-19 MED ORDER — IPRATROPIUM-ALBUTEROL 0.5-2.5 (3) MG/3ML IN SOLN
3.0000 mL | Freq: Four times a day (QID) | RESPIRATORY_TRACT | Status: DC
  Administered 2023-08-19 – 2023-08-20 (×5): 3 mL via RESPIRATORY_TRACT
  Filled 2023-08-19 (×5): qty 3

## 2023-08-19 MED ORDER — OSELTAMIVIR PHOSPHATE 75 MG PO CAPS
75.0000 mg | ORAL_CAPSULE | Freq: Once | ORAL | Status: AC
  Administered 2023-08-19: 75 mg via ORAL
  Filled 2023-08-19: qty 1

## 2023-08-19 MED ORDER — OXYCODONE HCL 5 MG PO TABS
5.0000 mg | ORAL_TABLET | Freq: Four times a day (QID) | ORAL | Status: DC | PRN
  Administered 2023-08-19 – 2023-08-21 (×4): 5 mg via ORAL
  Filled 2023-08-19 (×4): qty 1

## 2023-08-19 MED ORDER — MOMETASONE FURO-FORMOTEROL FUM 200-5 MCG/ACT IN AERO
2.0000 | INHALATION_SPRAY | Freq: Two times a day (BID) | RESPIRATORY_TRACT | Status: DC
  Administered 2023-08-19 – 2023-08-20 (×3): 2 via RESPIRATORY_TRACT
  Filled 2023-08-19: qty 8.8

## 2023-08-19 MED ORDER — CHLORHEXIDINE GLUCONATE CLOTH 2 % EX PADS
6.0000 | MEDICATED_PAD | Freq: Every day | CUTANEOUS | Status: DC
  Administered 2023-08-20 – 2023-08-21 (×2): 6 via TOPICAL

## 2023-08-19 MED ORDER — METHYLPREDNISOLONE SODIUM SUCC 125 MG IJ SOLR
125.0000 mg | Freq: Once | INTRAMUSCULAR | Status: AC
  Administered 2023-08-19: 125 mg via INTRAVENOUS
  Filled 2023-08-19: qty 2

## 2023-08-19 MED ORDER — ACETAMINOPHEN 500 MG PO TABS
1000.0000 mg | ORAL_TABLET | Freq: Once | ORAL | Status: AC
  Administered 2023-08-19: 1000 mg via ORAL
  Filled 2023-08-19: qty 2

## 2023-08-19 MED ORDER — MUPIROCIN 2 % EX OINT
1.0000 | TOPICAL_OINTMENT | Freq: Two times a day (BID) | CUTANEOUS | Status: DC
  Administered 2023-08-19 – 2023-08-21 (×3): 1 via NASAL
  Filled 2023-08-19: qty 22

## 2023-08-19 NOTE — ED Triage Notes (Signed)
Pt BIBA from [facility] with reports of unwitnessed fall in the bathroom, hit their head, not on thinners. Pt unable to recall why or how they fell. Aox4, denies any new pain. Pt ST 110, 87%RA, PMX COPD but does not use oxygen at baseline. Audible wheezing noted bilaterally. Denies SHOB or CP. Placed on 2LNC 99%.

## 2023-08-19 NOTE — ED Provider Notes (Signed)
[place] EMERGENCY DEPARTMENT AT [facility] Provider Note   CSN: [id number] Arrival date & time: 08/19/23  7564     History  Chief Complaint  Patient presents with   Dale Edwards is a 68 y.o. male.  The history is provided by the patient and medical records.  Fall Associated symptoms include shortness of breath.   68 y.o. male with history of COPD, emphysema, vit D deficiency, MS, presenting to the ED after a fall.  Patient states he fell in the bathroom, struck his head against the cabinet.  There was no loss of consciousness.  He denies any significant headache.  EMS was called and noticed that he was hypoxic down to 84%, was started on 2 L and improved to 90s.  He is not generally oxygen dependent.  Patient does have history of COPD, still smoking heavily.  He does report cough recently but denies fever.  Found to be febrile here to 100.22F.   Denies known sick contacts.  Home Medications Prior to Admission medications   Medication Sig Start Date End Date Taking? Authorizing Provider  acetaminophen (TYLENOL) 500 MG tablet Take 2 tablets (1,000 mg total) by mouth every 6 (six) hours as needed for mild pain or moderate pain. 12/27/21   Fayrene Helper, PA-C  albuterol (VENTOLIN HFA) 108 (90 Base) MCG/ACT inhaler Inhale 2 puffs into the lungs every 6 (six) hours as needed for wheezing or shortness of breath. 07/27/21   Regalado, Belkys A, MD  cyclobenzaprine (FLEXERIL) 10 MG tablet Take 1 tablet (10 mg total) by mouth 2 (two) times daily as needed for muscle spasms. 12/27/21   Fayrene Helper, PA-C  SYMBICORT 160-4.5 MCG/ACT inhaler Inhale 2 puffs into the lungs in the morning and at bedtime. 11/13/21   [provider]      Allergies    Hydrocodone-acetaminophen    Review of Systems   Review of Systems  Respiratory:  Positive for cough and shortness of breath.   All other systems reviewed and are negative.   Physical Exam Updated Vital Signs BP 107/76  (BP Location: Left Arm)   Pulse (!) 109   Temp (!) 100.7 F (38.2 C) (Oral)   Resp 20   Wt 77.1 kg   SpO2 99%   BMI 27.44 kg/m   Physical Exam Vitals and nursing note reviewed.  Constitutional:      Appearance: He is well-developed.  HENT:     Head: Normocephalic and atraumatic.  Eyes:     Conjunctiva/sclera: Conjunctivae normal.     Pupils: Pupils are equal, round, and reactive to light.  Cardiovascular:     Rate and Rhythm: Normal rate and regular rhythm.     Heart sounds: Normal heart sounds.  Pulmonary:     Effort: Retractions present.     Breath sounds: Normal breath sounds.     Comments: Slight retractions, expiratory wheezes throughout, able to speak in sentences, currently on 2 L nasal canula Abdominal:     General: Bowel sounds are normal.     Palpations: Abdomen is soft.  Musculoskeletal:        General: Normal range of motion.     Cervical back: Normal range of motion.  Skin:    General: Skin is warm and dry.  Neurological:     Mental Status: He is alert and oriented to person, place, and time.     ED Results / Procedures / Treatments   Labs (all labs ordered are listed,  but only abnormal results are displayed) Labs Reviewed  RESP PANEL BY RT-PCR (RSV, FLU A&B, COVID)  RVPGX2 - Abnormal; Notable for the following components:      Result Value   Influenza A by PCR POSITIVE (*)    All other components within normal limits  CBC WITH DIFFERENTIAL/PLATELET - Abnormal; Notable for the following components:   Lymphs Abs 0.6 (*)    All other components within normal limits  BASIC METABOLIC PANEL - Abnormal; Notable for the following components:   Glucose, Bld 117 (*)    Calcium 8.8 (*)    All other components within normal limits  CULTURE, BLOOD (ROUTINE X 2)  CULTURE, BLOOD (ROUTINE X 2)  URINE CULTURE  LACTIC ACID, PLASMA  URINALYSIS, W/ REFLEX TO CULTURE (INFECTION SUSPECTED)    EKG None  Radiology CT HEAD WO CONTRAST ( ) Result Date:  08/19/2023 CLINICAL DATA:  Unwitnessed fall in the bathroom.  Hit head. EXAM: CT HEAD WITHOUT CONTRAST CT CERVICAL SPINE WITHOUT CONTRAST TECHNIQUE: Multidetector CT imaging of the head and cervical spine was performed following the standard protocol without intravenous contrast. Multiplanar CT image reconstructions of the cervical spine were also generated. RADIATION DOSE REDUCTION: This exam was performed according to the departmental dose-optimization program which includes automated exposure control, adjustment of the mA and/or kV according to patient size and/or use of iterative reconstruction technique. COMPARISON:  CT head and cervical spine 10/12/2021 FINDINGS: CT HEAD FINDINGS Brain: No intracranial hemorrhage, mass effect, or evidence of acute infarct. No hydrocephalus. No extra-axial fluid collection. Age advanced cerebral atrophy and chronic small vessel ischemic disease. Vascular: No hyperdense vessel. Intracranial arterial calcification. Skull: No fracture or focal lesion. Sinuses/Orbits: No acute finding. Chronic opacification of a few left mastoid air cells. Other: None. CT CERVICAL SPINE FINDINGS Alignment: Motion mildly obscures detail despite repeat scanning. No evidence of traumatic malalignment. Skull base and vertebrae: No acute fracture. No primary bone lesion or focal pathologic process. Soft tissues and spinal canal: No prevertebral fluid or swelling. No visible canal hematoma. Disc levels: Mild multilevel spondylosis with bulky anterior osteophytes. Mild disc space height loss at C6-C7. No severe spinal canal narrowing. Upper chest: No acute abnormality. Other: Carotid calcification. IMPRESSION: 1. No acute intracranial abnormality. 2. No acute fracture in the cervical spine. Electronically Signed   By: Minerva Fester M.D.   On: 08/19/2023 02:58   CT Cervical Spine Wo Contrast Result Date: 08/19/2023 CLINICAL DATA:  Unwitnessed fall in the bathroom.  Hit head. EXAM: CT HEAD WITHOUT  CONTRAST CT CERVICAL SPINE WITHOUT CONTRAST TECHNIQUE: Multidetector CT imaging of the head and cervical spine was performed following the standard protocol without intravenous contrast. Multiplanar CT image reconstructions of the cervical spine were also generated. RADIATION DOSE REDUCTION: This exam was performed according to the departmental dose-optimization program which includes automated exposure control, adjustment of the mA and/or kV according to patient size and/or use of iterative reconstruction technique. COMPARISON:  CT head and cervical spine 10/12/2021 FINDINGS: CT HEAD FINDINGS Brain: No intracranial hemorrhage, mass effect, or evidence of acute infarct. No hydrocephalus. No extra-axial fluid collection. Age advanced cerebral atrophy and chronic small vessel ischemic disease. Vascular: No hyperdense vessel. Intracranial arterial calcification. Skull: No fracture or focal lesion. Sinuses/Orbits: No acute finding. Chronic opacification of a few left mastoid air cells. Other: None. CT CERVICAL SPINE FINDINGS Alignment: Motion mildly obscures detail despite repeat scanning. No evidence of traumatic malalignment. Skull base and vertebrae: No acute fracture. No primary bone lesion or focal pathologic  process. Soft tissues and spinal canal: No prevertebral fluid or swelling. No visible canal hematoma. Disc levels: Mild multilevel spondylosis with bulky anterior osteophytes. Mild disc space height loss at C6-C7. No severe spinal canal narrowing. Upper chest: No acute abnormality. Other: Carotid calcification. IMPRESSION: 1. No acute intracranial abnormality. 2. No acute fracture in the cervical spine. Electronically Signed   By: Minerva Fester M.D.   On: 08/19/2023 02:58   DG Chest 2 View Result Date: 08/19/2023 CLINICAL DATA:  Shortness of breath EXAM: CHEST - 2 VIEW COMPARISON:  11/26/2021 FINDINGS: Cardiomegaly. Vascular congestion. Interstitial prominence. Bilateral lower lobe airspace opacities and  small effusions. Findings most compatible with edema/CHF. IMPRESSION: Cardiomegaly with vascular congestion and mild pulmonary edema. Electronically Signed   By: Charlett Nose M.D.   On: 08/19/2023 01:36    Procedures Procedures    CRITICAL CARE Performed by: Garlon Hatchet   Total critical care time: 40 minutes  Critical care time was exclusive of separately billable procedures and treating other patients.  Critical care was necessary to treat or prevent imminent or life-threatening deterioration.  Critical care was time spent personally by me on the following activities: development of treatment plan with patient and/or surrogate as well as nursing, discussions with consultants, evaluation of patient's response to treatment, examination of patient, obtaining history from patient or surrogate, ordering and performing treatments and interventions, ordering and review of laboratory studies, ordering and review of radiographic studies, pulse oximetry and re-evaluation of patient's condition.   Medications Ordered in ED Medications  acetaminophen (TYLENOL) tablet 1,000 mg (has no administration in time range)  ipratropium-albuterol (DUONEB) 0.5-2.5 (3) MG/3ML nebulizer solution 3 mL (has no administration in time range)  methylPREDNISolone sodium succinate (SOLU-MEDROL) 125 mg/2 mL injection 125 mg (has no administration in time range)    ED Course/ Medical Decision Making/ A&P                                 Medical Decision Making Amount and/or Complexity of Data Reviewed Labs: ordered. Radiology: ordered and independent interpretation performed. ECG/medicine tests: ordered and independent interpretation performed.  Risk OTC drugs. Prescription drug management. Decision regarding hospitalization.   68 year old male presenting to the ED after a fall.  Sounds like a mechanical fall in the bathroom, found to be hypoxic in the 80s with EMS.  Has history of COPD but not generally  O2 dependent.  Currently on 2 L.  He does have expiratory wheezes.  She does report cough and found to be febrile here.  Will check lactate, labs, cultures, RVP, chest x-ray.  Also sent for CT head and neck.  CT head neck without acute findings.  Chest x-ray with some mild pulmonary edema.  He does not appear significantly fluid overloaded on exam.  Normal lactate, no leukocytosis.  No significant electrolyte derangement.  3:38 AM Wheezing has improved after interventions here.  He remained stable on 2 L.  RVP is positive for influenza A.  He was started on Tamiflu.  Given his new O2 requirement, he will require admission.  Spoke with Dr. Maisie Fus-- will admit for ongoing care.  Final Clinical Impression(s) / ED Diagnoses Final diagnoses:  Influenza A  Hypoxia    Rx / DC Orders ED Discharge Orders     None         Garlon Hatchet, PA-C 08/19/23 Willis Modena, MD 08/19/23 [phone]

## 2023-08-19 NOTE — H&P (Signed)
History and Physical    Dale Edwards XBJ:[id number] DOB: 21-Nov-1955 DOA: 08/19/2023  PCP: Pcp, No  Patient coming from: facility  I have personally briefly reviewed patient's old medical records in [facility]  Chief Complaint: fall/ hypoxemia  HPI: Dale Edwards is a 68 y.o. 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 ON arrival EMS noted that patient was hypoxic to the mid 80's with audible wheezing. Patient was placed on [facility] and increased to 99%. Of note patient currently does not recall how he fell. He notes he struck his head against a cabinet.  He currently notes no pain or  other injury s/p fall. He does also noted cough  but noted no fever/ chills/ n/v/d/chest /sob or abdominal pain. He does note chest pain with cough and deep breathing.  ED Course:   Temp 100.65m hr 108 rr 20 Sat 99% on 3L Cxr: IMPRESSION: Cardiomegaly with vascular congestion and mild pulmonary edema.  wbc 7.3, hgb 15.8 Lactic 1.1 NA 138, K 4.2, Cl 105, co2 22, glu 177cr 0.97 EKG- sinus tachycardia pvc,  RVP:+fluA HYQ:MVHQIONGEX: 1. No acute intracranial abnormality. 2. No acute fracture in the cervical spine.  Tx tamflu 75 mg x 1  Tx dounebs, solumedrol,tylenol  Review of Systems: As per HPI otherwise 10 point review of systems negative.   Past Medical History:  Diagnosis Date   Back pain    COPD (chronic obstructive pulmonary disease) (HCC)    Dyspnea    Heart murmur    Multiple sclerosis (HCC)    Tremors of nervous system    Right    Past Surgical History:  Procedure Laterality Date   ADENOIDECTOMY     broken leg     CRANIOTOMY Right 01/06/2022   Procedure: Right Craniotomy for Subdural Hematoma;  Surgeon: Tia Alert, MD;  Location: [facility] OR;  Service: Neurosurgery;  Laterality: Right;  RM 21   TONSILLECTOMY       reports  that he has been smoking cigarettes. He has never used smokeless tobacco. He reports current alcohol use of about 1.0 standard drink of alcohol per week. He reports that he does not use drugs.  Allergies  Allergen Reactions   Hydrocodone-Acetaminophen Itching and Nausea Only         Family History  Problem Relation Age of Onset   Heart Problems Mother     Prior to Admission medications   Medication Sig Start Date End Date Taking? Authorizing Provider  acetaminophen (TYLENOL) 500 MG tablet Take 2 tablets (1,000 mg total) by mouth every 6 (six) hours as needed for mild pain or moderate pain. 12/27/21   Fayrene Helper, PA-C  albuterol (VENTOLIN HFA) 108 (90 Base) MCG/ACT inhaler Inhale 2 puffs into the lungs every 6 (six) hours as needed for wheezing or shortness of breath. 07/27/21   Regalado, Belkys A, MD  cyclobenzaprine (FLEXERIL) 10 MG tablet Take 1 tablet (10 mg total) by mouth 2 (two) times daily as needed for muscle spasms. 12/27/21   Fayrene Helper, PA-C  SYMBICORT 160-4.5 MCG/ACT inhaler Inhale 2 puffs into the lungs in the morning and at bedtime. 11/13/21   [provider]    Physical Exam: Vitals:   08/19/23 0052 08/19/23 0205 08/19/23 0230 08/19/23 0415  BP: 107/76  106/65 114/64  Pulse: (!) 109 95 (!) 104 87  Resp: 20 (!) 22 (!) 24  20  Temp: (!) 100.7 F (38.2 C)   97.6 F (36.4 C)  TempSrc: Oral   Oral  SpO2: 99% 100% 100% 98%  Weight:        Constitutional: NAD, calm, comfortable/diaphoretic Vitals:   08/19/23 0052 08/19/23 0205 08/19/23 0230 08/19/23 0415  BP: 107/76  106/65 114/64  Pulse: (!) 109 95 (!) 104 87  Resp: 20 (!) 22 (!) 24 20  Temp: (!) 100.7 F (38.2 C)   97.6 F (36.4 C)  TempSrc: Oral   Oral  SpO2: 99% 100% 100% 98%  Weight:       Eyes: PERRL, lids and conjunctivae normal ENMT: Mucous membranes are dry. .Normal dentition.  Neck: normal, supple, no masses, no thyromegaly Respiratory: +wheezing, no crackles. Normal respiratory effort. No  accessory muscle use.  Cardiovascular: Regular rate and rhythm, no murmurs / rubs / gallops. No extremity edema. 2+ pedal pulses. Abdomen: no tenderness, no masses palpated. No hepatosplenomegaly. Bowel sounds positive.  Musculoskeletal: no clubbing / cyanosis. No joint deformity upper and lower extremities. Good ROM, no contractures. Normal muscle tone.  Skin: no rashes, lesions, ulcers. No induration Neurologic: CN 2-12 grossly intact. Sensation intact,  Strength 4/5 in all 4.  Psychiatric: Normal judgment and insight. Alert and oriented . Normal mood.    Labs on Admission: I have personally reviewed following labs and imaging studies  CBC: Recent Labs  Lab 08/19/23 0210  WBC 7.3  NEUTROABS 5.5  HGB 15.8  HCT 48.4  MCV 94.9  PLT 202   Basic Metabolic Panel: Recent Labs  Lab 08/19/23 0210  NA 138  K 4.2  CL 105  CO2 22  GLUCOSE 117*  BUN 17  CREATININE 0.97  CALCIUM 8.8*   GFR: CrCl cannot be calculated (Unknown ideal weight.). Liver Function Tests: No results for input(s): "AST", "ALT", "ALKPHOS", "BILITOT", "PROT", "ALBUMIN" in the last 168 hours. No results for input(s): "LIPASE", "AMYLASE" in the last 168 hours. No results for input(s): "AMMONIA" in the last 168 hours. Coagulation Profile: No results for input(s): "INR", "PROTIME" in the last 168 hours. Cardiac Enzymes: No results for input(s): "CKTOTAL", "CKMB", "CKMBINDEX", "TROPONINI" in the last 168 hours. BNP (last 3 results) No results for input(s): "PROBNP" in the last 8760 hours. HbA1C: No results for input(s): "HGBA1C" in the last 72 hours. CBG: No results for input(s): "GLUCAP" in the last 168 hours. Lipid Profile: No results for input(s): "CHOL", "HDL", "LDLCALC", "TRIG", "CHOLHDL", "LDLDIRECT" in the last 72 hours. Thyroid Function Tests: No results for input(s): "TSH", "T4TOTAL", "FREET4", "T3FREE", "THYROIDAB" in the last 72 hours. Anemia Panel: No results for input(s): "VITAMINB12",  "FOLATE", "FERRITIN", "TIBC", "IRON", "RETICCTPCT" in the last 72 hours. Urine analysis:    Component Value Date/Time   COLORURINE YELLOW 01/12/2022 0907   APPEARANCEUR CLEAR 01/12/2022 0907   LABSPEC 1.017 01/12/2022 0907   PHURINE 6.0 01/12/2022 0907   GLUCOSEU NEGATIVE 01/12/2022 0907   HGBUR NEGATIVE 01/12/2022 0907   BILIRUBINUR NEGATIVE 01/12/2022 0907   KETONESUR NEGATIVE 01/12/2022 0907   PROTEINUR NEGATIVE 01/12/2022 0907   UROBILINOGEN 0.2 12/17/2011 1743   NITRITE NEGATIVE 01/12/2022 0907   LEUKOCYTESUR NEGATIVE 01/12/2022 4782    Radiological Exams on Admission: CT HEAD WO CONTRAST ( ) Result Date: 08/19/2023 CLINICAL DATA:  Unwitnessed fall in the bathroom.  Hit head. EXAM: CT HEAD WITHOUT CONTRAST CT CERVICAL SPINE WITHOUT CONTRAST TECHNIQUE: Multidetector CT imaging of the head and cervical spine was performed following the standard protocol without intravenous contrast. Multiplanar CT image reconstructions  of the cervical spine were also generated. RADIATION DOSE REDUCTION: This exam was performed according to the departmental dose-optimization program which includes automated exposure control, adjustment of the mA and/or kV according to patient size and/or use of iterative reconstruction technique. COMPARISON:  CT head and cervical spine 10/12/2021 FINDINGS: CT HEAD FINDINGS Brain: No intracranial hemorrhage, mass effect, or evidence of acute infarct. No hydrocephalus. No extra-axial fluid collection. Age advanced cerebral atrophy and chronic small vessel ischemic disease. Vascular: No hyperdense vessel. Intracranial arterial calcification. Skull: No fracture or focal lesion. Sinuses/Orbits: No acute finding. Chronic opacification of a few left mastoid air cells. Other: None. CT CERVICAL SPINE FINDINGS Alignment: Motion mildly obscures detail despite repeat scanning. No evidence of traumatic malalignment. Skull base and vertebrae: No acute fracture. No primary bone lesion or  focal pathologic process. Soft tissues and spinal canal: No prevertebral fluid or swelling. No visible canal hematoma. Disc levels: Mild multilevel spondylosis with bulky anterior osteophytes. Mild disc space height loss at C6-C7. No severe spinal canal narrowing. Upper chest: No acute abnormality. Other: Carotid calcification. IMPRESSION: 1. No acute intracranial abnormality. 2. No acute fracture in the cervical spine. Electronically Signed   By: Minerva Fester M.D.   On: 08/19/2023 02:58   CT Cervical Spine Wo Contrast Result Date: 08/19/2023 CLINICAL DATA:  Unwitnessed fall in the bathroom.  Hit head. EXAM: CT HEAD WITHOUT CONTRAST CT CERVICAL SPINE WITHOUT CONTRAST TECHNIQUE: Multidetector CT imaging of the head and cervical spine was performed following the standard protocol without intravenous contrast. Multiplanar CT image reconstructions of the cervical spine were also generated. RADIATION DOSE REDUCTION: This exam was performed according to the departmental dose-optimization program which includes automated exposure control, adjustment of the mA and/or kV according to patient size and/or use of iterative reconstruction technique. COMPARISON:  CT head and cervical spine 10/12/2021 FINDINGS: CT HEAD FINDINGS Brain: No intracranial hemorrhage, mass effect, or evidence of acute infarct. No hydrocephalus. No extra-axial fluid collection. Age advanced cerebral atrophy and chronic small vessel ischemic disease. Vascular: No hyperdense vessel. Intracranial arterial calcification. Skull: No fracture or focal lesion. Sinuses/Orbits: No acute finding. Chronic opacification of a few left mastoid air cells. Other: None. CT CERVICAL SPINE FINDINGS Alignment: Motion mildly obscures detail despite repeat scanning. No evidence of traumatic malalignment. Skull base and vertebrae: No acute fracture. No primary bone lesion or focal pathologic process. Soft tissues and spinal canal: No prevertebral fluid or swelling. No  visible canal hematoma. Disc levels: Mild multilevel spondylosis with bulky anterior osteophytes. Mild disc space height loss at C6-C7. No severe spinal canal narrowing. Upper chest: No acute abnormality. Other: Carotid calcification. IMPRESSION: 1. No acute intracranial abnormality. 2. No acute fracture in the cervical spine. Electronically Signed   By: Minerva Fester M.D.   On: 08/19/2023 02:58   DG Chest 2 View Result Date: 08/19/2023 CLINICAL DATA:  Shortness of breath EXAM: CHEST - 2 VIEW COMPARISON:  11/26/2021 FINDINGS: Cardiomegaly. Vascular congestion. Interstitial prominence. Bilateral lower lobe airspace opacities and small effusions. Findings most compatible with edema/CHF. IMPRESSION: Cardiomegaly with vascular congestion and mild pulmonary edema. Electronically Signed   By: Charlett Nose M.D.   On: 08/19/2023 01:36    EKG: Independently reviewed. See abpve  Assessment/Plan   Influenza A -admit to med tele - supportive care with ivfs, prn cough /pain medications  -continue tamiflu   AECOPD -in setting of flu - solumedrol iv , bid taper to po prednisone daily - no abx as + flu w/o concern currently for  superinfection -f/u on sputum cultures  -consider further imaging of chest if patient does not improve - nebs standing and prn  -resume chronic inhalers  -pulmonary toilet  -wean O2 as able   Abn CXR -noted ? Fluid overload  -patient without signs of fluid overload  -further evaluation with bnp and CT chest    Multiple Sclerosis Hx of fall due to debility  Hx of SDH s/p falls -no acute issues currently -s/p fall CTH and neck negative ,no other injury noted   DVT prophylaxis: scd Code Status: DNR/ as discussed per patient wishes in event of cardiac arrest  Family Communication: none at bedside Disposition Plan: patient  expected to be admitted greater than 2 midnights  Consults called: n./a Admission status: med tele   Lurline Del MD [facility]   If 7PM-7AM, please contact night-coverage [email or web address] Password [facility]  08/19/2023, 5:28 AM

## 2023-08-19 NOTE — Progress Notes (Signed)
Patient is seen and examined today morning. Patient is admitted for influenza infection, COPD exacerbation. He is lying in bed with mild respiratory distress. Did not get out of bed. Has low back pain. Eating fair.   Reviewed CT chest - no PE, but pulmonary hypertension, emphysema noted. BNP 132. Chest xray shows cardiomegaly, congestion. Will get Echo for CHF evaluation. Continue steroids, tamiflu, bronchodilators. Home medications resumed. Started on Oxycodone for severe back pain.  I advised him to quit smoking. Discussed ill effects of tobacco. States he tried to quit once in his 68s but then started smoking 1 pack per day. Advised Nicotine patches, which he refused to take and thinks it ineffective. Did discuss about other medications which he can try with his PCP. He understands.  Total time for smoking cessation counseling 7 min.

## 2023-08-19 NOTE — Plan of Care (Signed)
?  Problem: Education: ?Goal: Knowledge of disease or condition will improve ?Outcome: Progressing ?Goal: Knowledge of the prescribed therapeutic regimen will improve ?Outcome: Progressing ?Goal: Individualized Educational Video(s) ?Outcome: Progressing ?  ?Problem: Activity: ?Goal: Ability to tolerate increased activity will improve ?Outcome: Progressing ?Goal: Will verbalize the importance of balancing activity with adequate rest periods ?Outcome: Progressing ?  ?Problem: Respiratory: ?Goal: Ability to maintain a clear airway will improve ?Outcome: Progressing ?Goal: Levels of oxygenation will improve ?Outcome: Progressing ?Goal: Ability to maintain adequate ventilation will improve ?Outcome: Progressing ?  ?Problem: Activity: ?Goal: Ability to tolerate increased activity will improve ?Outcome: Progressing ?  ?Problem: Clinical Measurements: ?Goal: Ability to maintain a body temperature in the normal range will improve ?Outcome: Progressing ?  ?Problem: Respiratory: ?Goal: Ability to maintain adequate ventilation will improve ?Outcome: Progressing ?Goal: Ability to maintain a clear airway will improve ?Outcome: Progressing ?  ?

## 2023-08-19 NOTE — Progress Notes (Signed)
NEW ADMISSION NOTE New Admission Note:   Arrival Method: Patient arrived from ED  Mental Orientation: Alert and oriented x 4 Telemetry:  Assessment: Completed Skin: warm and dry IV: L AC Pain: No pain . Tubes: N/A Safety Measures: Safety Fall Prevention Plan has been given, discussed and signed Admission: Completed [facility] Orientation: Patient has been orientated to the room, unit and staff.  Family:  Orders have been reviewed and implemented. Will continue to monitor the patient. Call light has been placed within reach and bed alarm has been activated.   Arvilla Meres, RN

## 2023-08-19 NOTE — Progress Notes (Incomplete)
NEW ADMISSION NOTE New Admission Note:   Arrival Method: Patient arrived from ED on strether Mental Orientation: Alert and oriented x  Telemetry: Assessment: Completed Skin: IV: Pain: Tubes: Safety Measures: Safety Fall Prevention Plan has been given, discussed and signed Admission: Completed [facility] Orientation: Patient has been orientated to the room, unit and staff.  Family:  Orders have been reviewed and implemented. Will continue to monitor the patient. Call light has been placed within reach and bed alarm has been activated.   Arvilla Meres, RN

## 2023-08-20 DIAGNOSIS — J101 Influenza due to other identified influenza virus with other respiratory manifestations: Secondary | ICD-10-CM | POA: Diagnosis not present

## 2023-08-20 LAB — URINE CULTURE: Culture: <10000 — AB

## 2023-08-20 LAB — CBC
HCT: 45.4 % (ref 39.0–52.0)
Hemoglobin: 15.1 g/dL (ref 13.0–17.0)
MCH: 31.3 pg (ref 26.0–34.0)
MCHC: 33.3 g/dL (ref 30.0–36.0)
MCV: 94 fL (ref 80.0–100.0)
Platelets: 228 10*3/uL (ref 150–400)
RBC: 4.83 MIL/uL (ref 4.22–5.81)
RDW: 12.8 % (ref 11.5–15.5)
WBC: 13.1 10*3/uL — ABNORMAL HIGH (ref 4.0–10.5)
nRBC: 0 % (ref 0.0–0.2)

## 2023-08-20 LAB — COMPREHENSIVE METABOLIC PANEL
ALT: 55 U/L — ABNORMAL HIGH (ref 0–44)
AST: 139 U/L — ABNORMAL HIGH (ref 15–41)
Albumin: 3.3 g/dL — ABNORMAL LOW (ref 3.5–5.0)
Alkaline Phosphatase: 59 U/L (ref 38–126)
Anion gap: 8 (ref 5–15)
BUN: 22 mg/dL (ref 8–23)
CO2: 29 mmol/L (ref 22–32)
Calcium: 9 mg/dL (ref 8.9–10.3)
Chloride: 104 mmol/L (ref 98–111)
Creatinine, Ser: 0.87 mg/dL (ref 0.61–1.24)
GFR, Estimated: >60 mL/min (ref >60)
Glucose, Bld: 180 mg/dL — ABNORMAL HIGH (ref 70–99)
Potassium: 4.3 mmol/L (ref 3.5–5.1)
Sodium: 141 mmol/L (ref 135–145)
Total Bilirubin: 0.7 mg/dL (ref 0.0–1.2)
Total Protein: 6.2 g/dL — ABNORMAL LOW (ref 6.5–8.1)

## 2023-08-20 MED ORDER — GUAIFENESIN-DM 100-10 MG/5ML PO SYRP
15.0000 mL | ORAL_SOLUTION | Freq: Four times a day (QID) | ORAL | Status: DC | PRN
  Administered 2023-08-20 – 2023-08-21 (×4): 15 mL via ORAL
  Filled 2023-08-20 (×4): qty 15

## 2023-08-20 MED ORDER — METHYLPREDNISOLONE SODIUM SUCC 125 MG IJ SOLR
80.0000 mg | Freq: Every day | INTRAMUSCULAR | Status: DC
  Administered 2023-08-20 – 2023-08-21 (×2): 80 mg via INTRAVENOUS
  Filled 2023-08-20 (×2): qty 2

## 2023-08-20 MED ORDER — IPRATROPIUM-ALBUTEROL 0.5-2.5 (3) MG/3ML IN SOLN
3.0000 mL | Freq: Two times a day (BID) | RESPIRATORY_TRACT | Status: DC
  Administered 2023-08-20 – 2023-08-21 (×2): 3 mL via RESPIRATORY_TRACT
  Filled 2023-08-20 (×2): qty 3

## 2023-08-20 NOTE — Plan of Care (Signed)
  Problem: Education: Goal: Knowledge of disease or condition will improve Outcome: Progressing Goal: Knowledge of the prescribed therapeutic regimen will improve Outcome: Progressing Goal: Individualized Educational Video(s) Outcome: Progressing   Problem: Activity: Goal: Ability to tolerate increased activity will improve Outcome: Progressing Goal: Will verbalize the importance of balancing activity with adequate rest periods Outcome: Progressing   Problem: Respiratory: Goal: Ability to maintain a clear airway will improve Outcome: Progressing Goal: Levels of oxygenation will improve Outcome: Progressing Goal: Ability to maintain adequate ventilation will improve Outcome: Progressing   Problem: Activity: Goal: Ability to tolerate increased activity will improve Outcome: Progressing   Problem: Clinical Measurements: Goal: Ability to maintain a body temperature in the normal range will improve Outcome: Progressing   Problem: Respiratory: Goal: Ability to maintain adequate ventilation will improve Outcome: Progressing Goal: Ability to maintain a clear airway will improve Outcome: Progressing   Problem: Education: Goal: Knowledge of General Education information will improve Description: Including pain rating scale, medication(s)/side effects and non-pharmacologic comfort measures Outcome: Progressing   Problem: Health Behavior/Discharge Planning: Goal: Ability to manage health-related needs will improve Outcome: Progressing   Problem: Clinical Measurements: Goal: Ability to maintain clinical measurements within normal limits will improve Outcome: Progressing Goal: Will remain free from infection Outcome: Progressing Goal: Diagnostic test results will improve Outcome: Progressing Goal: Respiratory complications will improve Outcome: Progressing Goal: Cardiovascular complication will be avoided Outcome: Progressing   Problem: Activity: Goal: Risk for activity  intolerance will decrease Outcome: Progressing   Problem: Nutrition: Goal: Adequate nutrition will be maintained Outcome: Progressing   Problem: Coping: Goal: Level of anxiety will decrease Outcome: Progressing   Problem: Elimination: Goal: Will not experience complications related to bowel motility Outcome: Progressing Goal: Will not experience complications related to urinary retention Outcome: Progressing   Problem: Pain Managment: Goal: General experience of comfort will improve and/or be controlled Outcome: Progressing   Problem: Safety: Goal: Ability to remain free from injury will improve Outcome: Progressing   Problem: Skin Integrity: Goal: Risk for impaired skin integrity will decrease Outcome: Progressing

## 2023-08-20 NOTE — Progress Notes (Signed)
PROGRESS NOTE    Dale Edwards  ONG:[id number] DOB: 1956/01/05 DOA: 08/19/2023 PCP: Pcp, No   Brief Narrative:  68 y.o. 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  On presentation, he required supplemental oxygen due to hypoxia.  Temperature was 100.7.  Chest x-ray showed cardiomegaly with vascular congestion and mild pulm edema.  He tested positive for influenza A.  CT head was negative for acute intracranial abnormality and CT cervical spine did not show any fracture.  CTA chest was negative for PE.  He was started on Tamiflu and Solu-Medrol.  Assessment & Plan:   Acute respite failure with hypoxia Influenza A acute bronchitis Possible COPD exacerbation -Imaging as above.  Required 2 L oxygen by nasal cannula on presentation.  Currently on room air. -Continue nebs and steroids along with Tamiflu.  Fall at home -Imaging as above.  PT eval  History of multiple sclerosis and history of SDH -PT eval.  Outpatient follow-up with neurology.    Elevated LFTs -Possibly viral.  Repeat a.m. LFTs  Acute metabolic acidosis -Improved  Leukocytosis -Possible reactive.   DVT prophylaxis: SCDs Code Status: DNR Family Communication: None at bedside. Disposition Plan: Status is: Inpatient Remains inpatient appropriate because: Of severity of illness    Consultants: None  Procedures: None  Antimicrobials:  Anti-infectives (From admission, onward)    Start     Dose/Rate Route Frequency Ordered Stop   08/19/23 2000  oseltamivir (TAMIFLU) capsule 75 mg        75 mg Oral 2 times daily 08/19/23 0552 08/24/23 0959   08/19/23 0345  oseltamivir (TAMIFLU) capsule 75 mg        75 mg Oral  Once 08/19/23 4401 08/19/23 0417        Subjective: Patient seen and examined.  Still short of breath with exertion denies any fever or vomiting.  Does  not feel ready to go home yet.  Feels weak.  Objective: Vitals:   08/20/23 0531 08/20/23 0826 08/20/23 0828 08/20/23 0836  BP: (!) 111/58   (!) 95/57  Pulse: 82   84  Resp: 18   18  Temp: 98.3 F (36.8 C)   98.2 F (36.8 C)  TempSrc:    Oral  SpO2: 93% 92% 92% 98%  Weight:      Height:        Intake/Output Summary (Last 24 hours) at 08/20/2023 1250 Last data filed at 08/20/2023 0838 Gross per 24 hour  Intake 240 ml  Output 600 ml  Net -360 ml   Filed Weights   08/19/23 0050  Weight: 77.1 kg    Examination:  General exam: Appears calm and comfortable.  Currently on room air. Respiratory system: Bilateral decreased breath sounds at bases with scattered crackles Cardiovascular system: S1 & S2 heard, Rate controlled Gastrointestinal system: Abdomen is nondistended, soft and nontender. Normal bowel sounds heard. Extremities: No cyanosis, clubbing, edema  Central nervous system: Alert and oriented. No focal neurological deficits.  Skin: No rashes, lesions or ulcers Psychiatry: Judgement and insight appear normal. Mood & affect appropriate.     Data Reviewed: I have personally reviewed following labs and imaging studies  CBC: Recent Labs  Lab 08/19/23 0210 08/19/23 0620 08/20/23 0312  WBC 7.3 6.6 13.1*  NEUTROABS 5.5  --   --   HGB 15.8 15.4 15.1  HCT 48.4 47.1 45.4  MCV 94.9 96.3 94.0  PLT  202 210 228   Basic Metabolic Panel: Recent Labs  Lab 08/19/23 0210 08/19/23 0620 08/20/23 0312  NA 138 137 141  K 4.2 4.1 4.3  CL 105 105 104  CO2 22 21* 29  GLUCOSE 117* 151* 180*  BUN 17 16 22   CREATININE 0.97 0.98 0.87  CALCIUM 8.8* 8.5* 9.0   GFR: Estimated Creatinine Clearance: 82.7 mL/min (by C-G formula based on SCr of 0.87 mg/dL). Liver Function Tests: Recent Labs  Lab 08/19/23 0620 08/20/23 0312  AST 119* 139*  ALT 41 55*  ALKPHOS 56 59  BILITOT 0.9 0.7  PROT 6.2* 6.2*  ALBUMIN 3.4* 3.3*   No results for input(s): "LIPASE", "AMYLASE" in the  last 168 hours. No results for input(s): "AMMONIA" in the last 168 hours. Coagulation Profile: No results for input(s): "INR", "PROTIME" in the last 168 hours. Cardiac Enzymes: No results for input(s): "CKTOTAL", "CKMB", "CKMBINDEX", "TROPONINI" in the last 168 hours. BNP (last 3 results) No results for input(s): "PROBNP" in the last 8760 hours. HbA1C: No results for input(s): "HGBA1C" in the last 72 hours. CBG: No results for input(s): "GLUCAP" in the last 168 hours. Lipid Profile: No results for input(s): "CHOL", "HDL", "LDLCALC", "TRIG", "CHOLHDL", "LDLDIRECT" in the last 72 hours. Thyroid Function Tests: No results for input(s): "TSH", "T4TOTAL", "FREET4", "T3FREE", "THYROIDAB" in the last 72 hours. Anemia Panel: No results for input(s): "VITAMINB12", "FOLATE", "FERRITIN", "TIBC", "IRON", "RETICCTPCT" in the last 72 hours. Sepsis Labs: Recent Labs  Lab 08/19/23 0210  LATICACIDVEN 1.1    Recent Results (from the past 240 hours)  Urine Culture     Status: Abnormal   Collection Time: 08/19/23  1:13 AM   Specimen: Urine, Clean Catch  Result Value Ref Range Status   Specimen Description URINE, CLEAN CATCH  Final   Special Requests NONE  Final   Culture (A)  Final    <10,000 COLONIES/mL INSIGNIFICANT GROWTH Performed at [facility], [address], [place], [place] [address]    Report Status 08/20/2023 FINAL  Final  Blood culture (routine x 2)     Status: None (Preliminary result)   Collection Time: 08/19/23  2:10 AM   Specimen: BLOOD  Result Value Ref Range Status   Specimen Description BLOOD LEFT ANTECUBITAL  Final   Special Requests   Final    BOTTLES DRAWN AEROBIC AND ANAEROBIC Blood Culture adequate volume   Culture   Final    NO GROWTH 1 DAY Performed at [facility], [address] [place], [place] [address]    Report Status PENDING  Incomplete  Resp panel by RT-PCR (RSV, Flu A&B, Covid) Anterior Nasal Swab     Status: Abnormal   Collection Time:  08/19/23  2:20 AM   Specimen: Anterior Nasal Swab  Result Value Ref Range Status   SARS Coronavirus 2 by RT PCR NEGATIVE NEGATIVE Final   Influenza A by PCR POSITIVE (A) NEGATIVE Final   Influenza B by PCR NEGATIVE NEGATIVE Final    Comment: (NOTE) The Xpert Xpress SARS-CoV-2/FLU/RSV plus assay is intended as an aid in the diagnosis of influenza from Nasopharyngeal swab specimens and should not be used as a sole basis for treatment. Nasal washings and aspirates are unacceptable for Xpert Xpress SARS-CoV-2/FLU/RSV testing.  Fact Sheet for Patients: BloggerCourse.com  Fact Sheet for Healthcare Providers: SeriousBroker.it  This test is not yet approved or cleared by the [place] FDA and has been authorized for detection and/or diagnosis of SARS-CoV-2 by FDA under  an Emergency Use Authorization (EUA). This EUA will remain in effect (meaning this test can be used) for the duration of the COVID-19 declaration under Section 564(b)(1) of the Act, 21 U.S.C. section 360bbb-3(b)(1), unless the authorization is terminated or revoked.     Resp Syncytial Virus by PCR NEGATIVE NEGATIVE Final    Comment: (NOTE) Fact Sheet for Patients: BloggerCourse.com  Fact Sheet for Healthcare Providers: SeriousBroker.it  This test is not yet approved or cleared by the [place] FDA and has been authorized for detection and/or diagnosis of SARS-CoV-2 by FDA under an Emergency Use Authorization (EUA). This EUA will remain in effect (meaning this test can be used) for the duration of the COVID-19 declaration under Section 564(b)(1) of the Act, 21 U.S.C. section 360bbb-3(b)(1), unless the authorization is terminated or revoked.  Performed at [facility], [address] [place], [place] [address]   Blood culture (routine x 2)     Status: None (Preliminary result)   Collection Time:  08/19/23  2:20 AM   Specimen: BLOOD  Result Value Ref Range Status   Specimen Description BLOOD RIGHT ANTECUBITAL  Final   Special Requests   Final    BOTTLES DRAWN AEROBIC AND ANAEROBIC Blood Culture results may not be optimal due to an inadequate volume of blood received in culture bottles   Culture   Final    NO GROWTH 1 DAY Performed at [facility], [address] [place], [place] [address]    Report Status PENDING  Incomplete  MRSA Next Gen by PCR, Nasal     Status: Abnormal   Collection Time: 08/19/23  3:45 PM   Specimen: Nasal Mucosa; Nasal Swab  Result Value Ref Range Status   MRSA by PCR Next Gen DETECTED (A) NOT DETECTED Final    Comment: RESULT CALLED TO, READ BACK BY AND VERIFIED WITH: RN N.MURPHY [id number] @[phone]  FH (NOTE) The GeneXpert MRSA Assay (FDA approved for NASAL specimens only), is one component of a comprehensive MRSA colonization surveillance program. It is not intended to diagnose MRSA infection nor to guide or monitor treatment for MRSA infections. Test performance is not FDA approved in patients less than 3 years old. Performed at [facility], [address] [place], [place] [address]          Radiology Studies: CT Angio Chest Pulmonary Embolism (PE) W or WO Contrast Result Date: 08/19/2023 CLINICAL DATA:  Shortness of breath. Clinical concern for pulmonary embolus. EXAM: CT ANGIOGRAPHY CHEST WITH CONTRAST TECHNIQUE: Multidetector CT imaging of the chest was performed using the standard protocol during bolus administration of intravenous contrast. Multiplanar CT image reconstructions and MIPs were obtained to evaluate the vascular anatomy. RADIATION DOSE REDUCTION: This exam was performed according to the departmental dose-optimization program which includes automated exposure control, adjustment of the mA and/or kV according to patient size and/or use of iterative reconstruction technique. CONTRAST:  75mL OMNIPAQUE IOHEXOL 350 MG/ML SOLN  COMPARISON:  CT chest 07/12/2021 FINDINGS: Cardiovascular: The heart size is normal. No substantial pericardial effusion. Coronary artery calcification is evident. Mild atherosclerotic calcification is noted in the wall of the thoracic aorta. There is no filling defect within the opacified pulmonary arteries to suggest the presence of an acute pulmonary embolus. Enlargement of the pulmonary outflow tract/main pulmonary arteries suggests pulmonary arterial hypertension. Mediastinum/Nodes: Similar upper normal mediastinal and hilar lymph nodes. Long-term stability suggests benign/reactive etiology. The esophagus has normal imaging features. There is no axillary lymphadenopathy. Lungs/Pleura: Centrilobular and paraseptal emphysema  evident. Bullous changes noted right lung apex. Architectural distortion and scarring is seen in the lateral right mid lung and posterior right lung base, similar to prior. Patchy areas of ground-glass opacity in the left upper lobe seen on the previous study have decreased substantially in the interval. No pleural effusion. Upper Abdomen: Unremarkable. Musculoskeletal: No worrisome lytic or sclerotic osseous abnormality. Review of the MIP images confirms the above findings. IMPRESSION: 1. No CT evidence for acute pulmonary embolus. 2. Enlargement of the pulmonary outflow tract/main pulmonary arteries suggests pulmonary arterial hypertension. 3. Patchy areas of ground-glass opacity in the left upper lobe seen on the previous study have decreased substantially in the interval. 4. Aortic Atherosclerosis (ICD10-I70.0) and Emphysema (ICD10-J43.9). Electronically Signed   By: Kennith Center M.D.   On: 08/19/2023 07:54   CT HEAD WO CONTRAST ( ) Result Date: 08/19/2023 CLINICAL DATA:  Unwitnessed fall in the bathroom.  Hit head. EXAM: CT HEAD WITHOUT CONTRAST CT CERVICAL SPINE WITHOUT CONTRAST TECHNIQUE: Multidetector CT imaging of the head and cervical spine was performed following the standard  protocol without intravenous contrast. Multiplanar CT image reconstructions of the cervical spine were also generated. RADIATION DOSE REDUCTION: This exam was performed according to the departmental dose-optimization program which includes automated exposure control, adjustment of the mA and/or kV according to patient size and/or use of iterative reconstruction technique. COMPARISON:  CT head and cervical spine 10/12/2021 FINDINGS: CT HEAD FINDINGS Brain: No intracranial hemorrhage, mass effect, or evidence of acute infarct. No hydrocephalus. No extra-axial fluid collection. Age advanced cerebral atrophy and chronic small vessel ischemic disease. Vascular: No hyperdense vessel. Intracranial arterial calcification. Skull: No fracture or focal lesion. Sinuses/Orbits: No acute finding. Chronic opacification of a few left mastoid air cells. Other: None. CT CERVICAL SPINE FINDINGS Alignment: Motion mildly obscures detail despite repeat scanning. No evidence of traumatic malalignment. Skull base and vertebrae: No acute fracture. No primary bone lesion or focal pathologic process. Soft tissues and spinal canal: No prevertebral fluid or swelling. No visible canal hematoma. Disc levels: Mild multilevel spondylosis with bulky anterior osteophytes. Mild disc space height loss at C6-C7. No severe spinal canal narrowing. Upper chest: No acute abnormality. Other: Carotid calcification. IMPRESSION: 1. No acute intracranial abnormality. 2. No acute fracture in the cervical spine. Electronically Signed   By: Minerva Fester M.D.   On: 08/19/2023 02:58   CT Cervical Spine Wo Contrast Result Date: 08/19/2023 CLINICAL DATA:  Unwitnessed fall in the bathroom.  Hit head. EXAM: CT HEAD WITHOUT CONTRAST CT CERVICAL SPINE WITHOUT CONTRAST TECHNIQUE: Multidetector CT imaging of the head and cervical spine was performed following the standard protocol without intravenous contrast. Multiplanar CT image reconstructions of the cervical spine  were also generated. RADIATION DOSE REDUCTION: This exam was performed according to the departmental dose-optimization program which includes automated exposure control, adjustment of the mA and/or kV according to patient size and/or use of iterative reconstruction technique. COMPARISON:  CT head and cervical spine 10/12/2021 FINDINGS: CT HEAD FINDINGS Brain: No intracranial hemorrhage, mass effect, or evidence of acute infarct. No hydrocephalus. No extra-axial fluid collection. Age advanced cerebral atrophy and chronic small vessel ischemic disease. Vascular: No hyperdense vessel. Intracranial arterial calcification. Skull: No fracture or focal lesion. Sinuses/Orbits: No acute finding. Chronic opacification of a few left mastoid air cells. Other: None. CT CERVICAL SPINE FINDINGS Alignment: Motion mildly obscures detail despite repeat scanning. No evidence of traumatic malalignment. Skull base and vertebrae: No acute fracture. No primary bone lesion or focal pathologic process. Soft tissues and  spinal canal: No prevertebral fluid or swelling. No visible canal hematoma. Disc levels: Mild multilevel spondylosis with bulky anterior osteophytes. Mild disc space height loss at C6-C7. No severe spinal canal narrowing. Upper chest: No acute abnormality. Other: Carotid calcification. IMPRESSION: 1. No acute intracranial abnormality. 2. No acute fracture in the cervical spine. Electronically Signed   By: Minerva Fester M.D.   On: 08/19/2023 02:58   DG Chest 2 View Result Date: 08/19/2023 CLINICAL DATA:  Shortness of breath EXAM: CHEST - 2 VIEW COMPARISON:  11/26/2021 FINDINGS: Cardiomegaly. Vascular congestion. Interstitial prominence. Bilateral lower lobe airspace opacities and small effusions. Findings most compatible with edema/CHF. IMPRESSION: Cardiomegaly with vascular congestion and mild pulmonary edema. Electronically Signed   By: Charlett Nose M.D.   On: 08/19/2023 01:36        Scheduled Meds:  aspirin EC   81 mg Oral Daily   Chlorhexidine Gluconate Cloth  6 each Topical Q0600   cholecalciferol  1,000 Units Oral Daily   gabapentin  300 mg Oral QHS   ipratropium-albuterol  3 mL Nebulization BID   mometasone-formoterol  2 puff Inhalation BID   mupirocin ointment  1 Application Nasal BID   oseltamivir  75 mg Oral BID   predniSONE  40 mg Oral Q breakfast   rosuvastatin  10 mg Oral QHS   Continuous Infusions:        Glade Lloyd, MD [facility] 08/20/2023, 12:50 PM

## 2023-08-21 DIAGNOSIS — J101 Influenza due to other identified influenza virus with other respiratory manifestations: Secondary | ICD-10-CM | POA: Diagnosis not present

## 2023-08-21 LAB — CBC WITH DIFFERENTIAL/PLATELET
Abs Immature Granulocytes: 0.05 10*3/uL (ref 0.00–0.07)
Basophils Absolute: 0 10*3/uL (ref 0.0–0.1)
Basophils Relative: 0 %
Eosinophils Absolute: 0 10*3/uL (ref 0.0–0.5)
Eosinophils Relative: 0 %
HCT: 42.7 % (ref 39.0–52.0)
Hemoglobin: 14.2 g/dL (ref 13.0–17.0)
Immature Granulocytes: 0 %
Lymphocytes Relative: 5 %
Lymphs Abs: 0.7 10*3/uL (ref 0.7–4.0)
MCH: 31.3 pg (ref 26.0–34.0)
MCHC: 33.3 g/dL (ref 30.0–36.0)
MCV: 94.1 fL (ref 80.0–100.0)
Monocytes Absolute: 0.5 10*3/uL (ref 0.1–1.0)
Monocytes Relative: 4 %
Neutro Abs: 10.8 10*3/uL — ABNORMAL HIGH (ref 1.7–7.7)
Neutrophils Relative %: 91 %
Platelets: 253 10*3/uL (ref 150–400)
RBC: 4.54 MIL/uL (ref 4.22–5.81)
RDW: 13.1 % (ref 11.5–15.5)
WBC: 12.1 10*3/uL — ABNORMAL HIGH (ref 4.0–10.5)
nRBC: 0 % (ref 0.0–0.2)

## 2023-08-21 LAB — LEGIONELLA PNEUMOPHILA SEROGP 1 UR AG: L. pneumophila Serogp 1 Ur Ag: NEGATIVE

## 2023-08-21 LAB — MAGNESIUM: Magnesium: 2 mg/dL (ref 1.7–2.4)

## 2023-08-21 LAB — COMPREHENSIVE METABOLIC PANEL
ALT: 69 U/L — ABNORMAL HIGH (ref 0–44)
AST: 103 U/L — ABNORMAL HIGH (ref 15–41)
Albumin: 3 g/dL — ABNORMAL LOW (ref 3.5–5.0)
Alkaline Phosphatase: 51 U/L (ref 38–126)
Anion gap: 6 (ref 5–15)
BUN: 26 mg/dL — ABNORMAL HIGH (ref 8–23)
CO2: 28 mmol/L (ref 22–32)
Calcium: 8.7 mg/dL — ABNORMAL LOW (ref 8.9–10.3)
Chloride: 105 mmol/L (ref 98–111)
Creatinine, Ser: 0.89 mg/dL (ref 0.61–1.24)
GFR, Estimated: >60 mL/min (ref >60)
Glucose, Bld: 135 mg/dL — ABNORMAL HIGH (ref 70–99)
Potassium: 5.1 mmol/L (ref 3.5–5.1)
Sodium: 139 mmol/L (ref 135–145)
Total Bilirubin: 0.5 mg/dL (ref 0.0–1.2)
Total Protein: 5.9 g/dL — ABNORMAL LOW (ref 6.5–8.1)

## 2023-08-21 MED ORDER — GUAIFENESIN-DM 100-10 MG/5ML PO SYRP: 10.0000 mL | ORAL_SOLUTION | Freq: Four times a day (QID) | ORAL | 0 refills | Status: AC | PRN

## 2023-08-21 MED ORDER — PREDNISONE 20 MG PO TABS: 40.0000 mg | ORAL_TABLET | Freq: Every day | ORAL | 0 refills | Status: AC

## 2023-08-21 MED ORDER — OSELTAMIVIR PHOSPHATE 75 MG PO CAPS: 75.0000 mg | ORAL_CAPSULE | Freq: Two times a day (BID) | ORAL | 0 refills | Status: AC

## 2023-08-21 NOTE — Discharge Summary (Addendum)
Physician Discharge Summary  Dale Edwards YQM:[id number] DOB: [birth year] DOA: 08/19/2023  PCP: Pcp, No  Admit date: 08/19/2023 Discharge date: 08/21/2023  Admitted From: SNF Disposition: SNF  Recommendations for Outpatient Follow-up:  Follow up with SNF provider at earliest convenience Follow up in ED if symptoms worsen or new appear   Home Health: No Equipment/Devices: None  Discharge Condition: Stable CODE STATUS: DNR Diet recommendation: Heart healthy  Brief/Interim Summary: 68 y.o. 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  On presentation, he required supplemental oxygen due to hypoxia.  Temperature was 100.7.  Chest x-ray showed cardiomegaly with vascular congestion and mild pulm edema.  He tested positive for influenza A.  CT head was negative for acute intracranial abnormality and CT cervical spine did not show any fracture.  CTA chest was negative for PE.  He was started on Tamiflu and Solu-Medrol.  During the hospitalizing, his condition is improved.  He will be discharged today back to SNF.  Discharge Diagnoses:   Acute respite failure with hypoxia Influenza A acute bronchitis Possible COPD exacerbation -Imaging as above.  Required 2 L oxygen by nasal cannula on presentation.  Currently on remains on 2 L oxygen via nasal cannula. -Finish 5-day course of Tamiflu.  Currently on IV Solu-Medrol.  Discharge back to SNF today on prednisone 40 mg daily for 7 days.  Continue prior to admission inhaled regimen.  Outpatient follow-up with SNF provider.   Fall at home -Imaging as above.  PT recommended SNF    History of multiple sclerosis and history of SDH -Outpatient follow-up with neurology.     Elevated LFTs -Possibly viral.  Still mildly elevated.  Outpatient follow-up.   Acute metabolic acidosis -Improved    Leukocytosis -Possible reactive.  Improving.    Discharge Instructions  Discharge Instructions     Diet - low sodium heart healthy   Complete by: As directed    Increase activity slowly   Complete by: As directed       Allergies as of 08/21/2023       Reactions   Hydrocodone-acetaminophen Itching, Nausea Only            Medication List     STOP taking these medications    Symbicort 160-4.5 MCG/ACT inhaler Generic drug: budesonide-formoterol       TAKE these medications    acetaminophen 500 MG tablet Commonly known as: TYLENOL Take 2 tablets (1,000 mg total) by mouth every 6 (six) hours as needed for mild pain or moderate pain. What changed: when to take this   albuterol 108 (90 Base) MCG/ACT inhaler Commonly known as: VENTOLIN HFA Inhale 2 puffs into the lungs every 6 (six) hours as needed for wheezing or shortness of breath.   aspirin EC 81 MG tablet Take 81 mg by mouth daily. Swallow whole.   cholecalciferol 25 MCG (1000 UNIT) tablet Commonly known as: VITAMIN D3 Take 1,000 Units by mouth daily.   cyclobenzaprine 10 MG tablet Commonly known as: FLEXERIL Take 1 tablet (10 mg total) by mouth 2 (two) times daily as needed for muscle spasms.   fluticasone-salmeterol 500-50 MCG/ACT Aepb Commonly known as: ADVAIR Inhale 1 puff into the lungs in the morning and at bedtime.   gabapentin 300 MG capsule Commonly known as: NEURONTIN Take 300 mg by mouth at bedtime.   guaiFENesin-dextromethorphan 100-10 MG/5ML syrup Commonly known as: ROBITUSSIN DM Take 10 mLs by  mouth every 6 (six) hours as needed for cough.   loratadine 10 MG tablet Commonly known as: CLARITIN Take 10 mg by mouth daily.   oseltamivir 75 MG capsule Commonly known as: TAMIFLU Take 1 capsule (75 mg total) by mouth 2 (two) times daily for 2 days.   predniSONE 20 MG tablet Commonly known as: DELTASONE Take 2 tablets (40 mg total) by mouth daily with breakfast for 7 days. Start  taking on: August 22, 2023   rosuvastatin 10 MG tablet Commonly known as: CRESTOR Take 10 mg by mouth at bedtime.        Allergies  Allergen Reactions   Hydrocodone-Acetaminophen Itching and Nausea Only         Consultations: None   Procedures/Studies: CT Angio Chest Pulmonary Embolism (PE) W or WO Contrast Result Date: 08/19/2023 CLINICAL DATA:  Shortness of breath. Clinical concern for pulmonary embolus. EXAM: CT ANGIOGRAPHY CHEST WITH CONTRAST TECHNIQUE: Multidetector CT imaging of the chest was performed using the standard protocol during bolus administration of intravenous contrast. Multiplanar CT image reconstructions and MIPs were obtained to evaluate the vascular anatomy. RADIATION DOSE REDUCTION: This exam was performed according to the departmental dose-optimization program which includes automated exposure control, adjustment of the mA and/or kV according to patient size and/or use of iterative reconstruction technique. CONTRAST:  75mL OMNIPAQUE IOHEXOL 350 MG/ML SOLN COMPARISON:  CT chest 07/12/2021 FINDINGS: Cardiovascular: The heart size is normal. No substantial pericardial effusion. Coronary artery calcification is evident. Mild atherosclerotic calcification is noted in the wall of the thoracic aorta. There is no filling defect within the opacified pulmonary arteries to suggest the presence of an acute pulmonary embolus. Enlargement of the pulmonary outflow tract/main pulmonary arteries suggests pulmonary arterial hypertension. Mediastinum/Nodes: Similar upper normal mediastinal and hilar lymph nodes. Long-term stability suggests benign/reactive etiology. The esophagus has normal imaging features. There is no axillary lymphadenopathy. Lungs/Pleura: Centrilobular and paraseptal emphysema evident. Bullous changes noted right lung apex. Architectural distortion and scarring is seen in the lateral right mid lung and posterior right lung base, similar to prior. Patchy areas of  ground-glass opacity in the left upper lobe seen on the previous study have decreased substantially in the interval. No pleural effusion. Upper Abdomen: Unremarkable. Musculoskeletal: No worrisome lytic or sclerotic osseous abnormality. Review of the MIP images confirms the above findings. IMPRESSION: 1. No CT evidence for acute pulmonary embolus. 2. Enlargement of the pulmonary outflow tract/main pulmonary arteries suggests pulmonary arterial hypertension. 3. Patchy areas of ground-glass opacity in the left upper lobe seen on the previous study have decreased substantially in the interval. 4. Aortic Atherosclerosis (ICD10-I70.0) and Emphysema (ICD10-J43.9). Electronically Signed   By: Kennith Center M.D.   On: 08/19/2023 07:54   CT HEAD WO CONTRAST ( ) Result Date: 08/19/2023 CLINICAL DATA:  Unwitnessed fall in the bathroom.  Hit head. EXAM: CT HEAD WITHOUT CONTRAST CT CERVICAL SPINE WITHOUT CONTRAST TECHNIQUE: Multidetector CT imaging of the head and cervical spine was performed following the standard protocol without intravenous contrast. Multiplanar CT image reconstructions of the cervical spine were also generated. RADIATION DOSE REDUCTION: This exam was performed according to the departmental dose-optimization program which includes automated exposure control, adjustment of the mA and/or kV according to patient size and/or use of iterative reconstruction technique. COMPARISON:  CT head and cervical spine 10/12/2021 FINDINGS: CT HEAD FINDINGS Brain: No intracranial hemorrhage, mass effect, or evidence of acute infarct. No hydrocephalus. No extra-axial fluid collection. Age advanced cerebral atrophy and chronic small vessel ischemic disease. Vascular: No  hyperdense vessel. Intracranial arterial calcification. Skull: No fracture or focal lesion. Sinuses/Orbits: No acute finding. Chronic opacification of a few left mastoid air cells. Other: None. CT CERVICAL SPINE FINDINGS Alignment: Motion mildly obscures  detail despite repeat scanning. No evidence of traumatic malalignment. Skull base and vertebrae: No acute fracture. No primary bone lesion or focal pathologic process. Soft tissues and spinal canal: No prevertebral fluid or swelling. No visible canal hematoma. Disc levels: Mild multilevel spondylosis with bulky anterior osteophytes. Mild disc space height loss at C6-C7. No severe spinal canal narrowing. Upper chest: No acute abnormality. Other: Carotid calcification. IMPRESSION: 1. No acute intracranial abnormality. 2. No acute fracture in the cervical spine. Electronically Signed   By: Minerva Fester M.D.   On: 08/19/2023 02:58   CT Cervical Spine Wo Contrast Result Date: 08/19/2023 CLINICAL DATA:  Unwitnessed fall in the bathroom.  Hit head. EXAM: CT HEAD WITHOUT CONTRAST CT CERVICAL SPINE WITHOUT CONTRAST TECHNIQUE: Multidetector CT imaging of the head and cervical spine was performed following the standard protocol without intravenous contrast. Multiplanar CT image reconstructions of the cervical spine were also generated. RADIATION DOSE REDUCTION: This exam was performed according to the departmental dose-optimization program which includes automated exposure control, adjustment of the mA and/or kV according to patient size and/or use of iterative reconstruction technique. COMPARISON:  CT head and cervical spine 10/12/2021 FINDINGS: CT HEAD FINDINGS Brain: No intracranial hemorrhage, mass effect, or evidence of acute infarct. No hydrocephalus. No extra-axial fluid collection. Age advanced cerebral atrophy and chronic small vessel ischemic disease. Vascular: No hyperdense vessel. Intracranial arterial calcification. Skull: No fracture or focal lesion. Sinuses/Orbits: No acute finding. Chronic opacification of a few left mastoid air cells. Other: None. CT CERVICAL SPINE FINDINGS Alignment: Motion mildly obscures detail despite repeat scanning. No evidence of traumatic malalignment. Skull base and vertebrae:  No acute fracture. No primary bone lesion or focal pathologic process. Soft tissues and spinal canal: No prevertebral fluid or swelling. No visible canal hematoma. Disc levels: Mild multilevel spondylosis with bulky anterior osteophytes. Mild disc space height loss at C6-C7. No severe spinal canal narrowing. Upper chest: No acute abnormality. Other: Carotid calcification. IMPRESSION: 1. No acute intracranial abnormality. 2. No acute fracture in the cervical spine. Electronically Signed   By: Minerva Fester M.D.   On: 08/19/2023 02:58   DG Chest 2 View Result Date: 08/19/2023 CLINICAL DATA:  Shortness of breath EXAM: CHEST - 2 VIEW COMPARISON:  11/26/2021 FINDINGS: Cardiomegaly. Vascular congestion. Interstitial prominence. Bilateral lower lobe airspace opacities and small effusions. Findings most compatible with edema/CHF. IMPRESSION: Cardiomegaly with vascular congestion and mild pulmonary edema. Electronically Signed   By: Charlett Nose M.D.   On: 08/19/2023 01:36      Subjective: Patient seen and examined at bedside.  Still has some intermittent cough and shortness of breath but feels better and feels okay to be discharged today.  No fever, vomiting, chest pain reported.  Discharge Exam: Vitals:   08/21/23 0748 08/21/23 0938  BP:  110/67  Pulse:  80  Resp:  18  Temp:  98.6 F (37 C)  SpO2: 97% 97%    General: Pt is alert, awake, not in acute distress.  Chronically ill and deconditioned looking.  Slow to respond.  Poor historian.  On 2 L oxygen via nasal cannula.  Flat affect. Cardiovascular: rate controlled, S1/S2 + Respiratory: bilateral decreased breath sounds at bases with scattered Abdominal: Soft, NT, ND, bowel sounds + Extremities: no edema, no cyanosis    The results  of significant diagnostics from this hospitalization (including imaging, microbiology, ancillary and laboratory) are listed below for reference.     Microbiology: Recent Results (from the past 240 hours)   Urine Culture     Status: Abnormal   Collection Time: 08/19/23  1:13 AM   Specimen: Urine, Clean Catch  Result Value Ref Range Status   Specimen Description URINE, CLEAN CATCH  Final   Special Requests NONE  Final   Culture (A)  Final    <10,000 COLONIES/mL INSIGNIFICANT GROWTH Performed at [facility], [address] [place], [place] [address]    Report Status 08/20/2023 FINAL  Final  Blood culture (routine x 2)     Status: None (Preliminary result)   Collection Time: 08/19/23  2:10 AM   Specimen: BLOOD  Result Value Ref Range Status   Specimen Description BLOOD LEFT ANTECUBITAL  Final   Special Requests   Final    BOTTLES DRAWN AEROBIC AND ANAEROBIC Blood Culture adequate volume   Culture   Final    NO GROWTH 1 DAY Performed at [facility], [address] [place], [place] [address]    Report Status PENDING  Incomplete  Resp panel by RT-PCR (RSV, Flu A&B, Covid) Anterior Nasal Swab     Status: Abnormal   Collection Time: 08/19/23  2:20 AM   Specimen: Anterior Nasal Swab  Result Value Ref Range Status   SARS Coronavirus 2 by RT PCR NEGATIVE NEGATIVE Final   Influenza A by PCR POSITIVE (A) NEGATIVE Final   Influenza B by PCR NEGATIVE NEGATIVE Final    Comment: (NOTE) The Xpert Xpress SARS-CoV-2/FLU/RSV plus assay is intended as an aid in the diagnosis of influenza from Nasopharyngeal swab specimens and should not be used as a sole basis for treatment. Nasal washings and aspirates are unacceptable for Xpert Xpress SARS-CoV-2/FLU/RSV testing.  Fact Sheet for Patients: BloggerCourse.com  Fact Sheet for Healthcare Providers: SeriousBroker.it  This test is not yet approved or cleared by the [place] FDA and has been authorized for detection and/or diagnosis of SARS-CoV-2 by FDA under an Emergency Use Authorization (EUA). This EUA will remain in effect (meaning this test can be used) for the duration of  the COVID-19 declaration under Section 564(b)(1) of the Act, 21 U.S.C. section 360bbb-3(b)(1), unless the authorization is terminated or revoked.     Resp Syncytial Virus by PCR NEGATIVE NEGATIVE Final    Comment: (NOTE) Fact Sheet for Patients: BloggerCourse.com  Fact Sheet for Healthcare Providers: SeriousBroker.it  This test is not yet approved or cleared by the [place] FDA and has been authorized for detection and/or diagnosis of SARS-CoV-2 by FDA under an Emergency Use Authorization (EUA). This EUA will remain in effect (meaning this test can be used) for the duration of the COVID-19 declaration under Section 564(b)(1) of the Act, 21 U.S.C. section 360bbb-3(b)(1), unless the authorization is terminated or revoked.  Performed at [facility], [address] [place], [place] [address]   Blood culture (routine x 2)     Status: None (Preliminary result)   Collection Time: 08/19/23  2:20 AM   Specimen: BLOOD  Result Value Ref Range Status   Specimen Description BLOOD RIGHT ANTECUBITAL  Final   Special Requests   Final    BOTTLES DRAWN AEROBIC AND ANAEROBIC Blood Culture results may not be optimal due to an inadequate volume of blood received in culture bottles   Culture   Final    NO GROWTH 1 DAY  Performed at [facility], [address] [place], [place] [address]    Report Status PENDING  Incomplete  MRSA Next Gen by PCR, Nasal     Status: Abnormal   Collection Time: 08/19/23  3:45 PM   Specimen: Nasal Mucosa; Nasal Swab  Result Value Ref Range Status   MRSA by PCR Next Gen DETECTED (A) NOT DETECTED Final    Comment: RESULT CALLED TO, READ BACK BY AND VERIFIED WITH: RN N.MURPHY [id number] @2003  FH (NOTE) The GeneXpert MRSA Assay (FDA approved for NASAL specimens only), is one component of a comprehensive MRSA colonization surveillance program. It is not intended to diagnose MRSA infection nor to guide or  monitor treatment for MRSA infections. Test performance is not FDA approved in patients less than 2 years old. Performed at [facility], [address], [place], [place] [address]      Labs: BNP (last 3 results) Recent Labs    08/19/23 0620  BNP 132.2*   Basic Metabolic Panel: Recent Labs  Lab 08/19/23 0210 08/19/23 0620 08/20/23 0312 08/21/23 0320  NA 138 137 141 139  K 4.2 4.1 4.3 5.1  CL 105 105 104 105  CO2 22 21* 29 28  GLUCOSE 117* 151* 180* 135*  BUN 17 16 22  26*  CREATININE 0.97 0.98 0.87 0.89  CALCIUM 8.8* 8.5* 9.0 8.7*  MG  --   --   --  2.0   Liver Function Tests: Recent Labs  Lab 08/19/23 0620 08/20/23 0312 08/21/23 0320  AST 119* 139* 103*  ALT 41 55* 69*  ALKPHOS 56 59 51  BILITOT 0.9 0.7 0.5  PROT 6.2* 6.2* 5.9*  ALBUMIN 3.4* 3.3* 3.0*   No results for input(s): "LIPASE", "AMYLASE" in the last 168 hours. No results for input(s): "AMMONIA" in the last 168 hours. CBC: Recent Labs  Lab 08/19/23 0210 08/19/23 0620 08/20/23 0312 08/21/23 0320  WBC 7.3 6.6 13.1* 12.1*  NEUTROABS 5.5  --   --  10.8*  HGB 15.8 15.4 15.1 14.2  HCT 48.4 47.1 45.4 42.7  MCV 94.9 96.3 94.0 94.1  PLT 202 210 228 253   Cardiac Enzymes: No results for input(s): "CKTOTAL", "CKMB", "CKMBINDEX", "TROPONINI" in the last 168 hours. BNP: Invalid input(s): "POCBNP" CBG: No results for input(s): "GLUCAP" in the last 168 hours. D-Dimer Recent Labs    08/19/23 0620  DDIMER 0.27   Hgb A1c No results for input(s): "HGBA1C" in the last 72 hours. Lipid Profile No results for input(s): "CHOL", "HDL", "LDLCALC", "TRIG", "CHOLHDL", "LDLDIRECT" in the last 72 hours. Thyroid function studies No results for input(s): "TSH", "T4TOTAL", "T3FREE", "THYROIDAB" in the last 72 hours.  Invalid input(s): "FREET3" Anemia work up No results for input(s): "VITAMINB12", "FOLATE", "FERRITIN", "TIBC", "IRON", "RETICCTPCT" in the last 72 hours. Urinalysis    Component Value  Date/Time   COLORURINE YELLOW 08/19/2023 1225   APPEARANCEUR CLEAR 08/19/2023 1225   LABSPEC >1.046 (H) 08/19/2023 1225   PHURINE 5.0 08/19/2023 1225   GLUCOSEU NEGATIVE 08/19/2023 1225   HGBUR MODERATE (A) 08/19/2023 1225   BILIRUBINUR NEGATIVE 08/19/2023 1225   KETONESUR 20 (A) 08/19/2023 1225   PROTEINUR 30 (A) 08/19/2023 1225   UROBILINOGEN 0.2 12/17/2011 1743   NITRITE NEGATIVE 08/19/2023 1225   LEUKOCYTESUR NEGATIVE 08/19/2023 1225   Sepsis Labs Recent Labs  Lab 08/19/23 0210 08/19/23 0620 08/20/23 0312 08/21/23 0320  WBC 7.3 6.6 13.1* 12.1*   Microbiology Recent Results (from the past 240 hours)  Urine Culture  Status: Abnormal   Collection Time: 08/19/23  1:13 AM   Specimen: Urine, Clean Catch  Result Value Ref Range Status   Specimen Description URINE, CLEAN CATCH  Final   Special Requests NONE  Final   Culture (A)  Final    <10,000 COLONIES/mL INSIGNIFICANT GROWTH Performed at [facility], [address], [place], [place] [address]    Report Status 08/20/2023 FINAL  Final  Blood culture (routine x 2)     Status: None (Preliminary result)   Collection Time: 08/19/23  2:10 AM   Specimen: BLOOD  Result Value Ref Range Status   Specimen Description BLOOD LEFT ANTECUBITAL  Final   Special Requests   Final    BOTTLES DRAWN AEROBIC AND ANAEROBIC Blood Culture adequate volume   Culture   Final    NO GROWTH 1 DAY Performed at [facility], [address], [place], [place] [address]    Report Status PENDING  Incomplete  Resp panel by RT-PCR (RSV, Flu A&B, Covid) Anterior Nasal Swab     Status: Abnormal   Collection Time: 08/19/23  2:20 AM   Specimen: Anterior Nasal Swab  Result Value Ref Range Status   SARS Coronavirus 2 by RT PCR NEGATIVE NEGATIVE Final   Influenza A by PCR POSITIVE (A) NEGATIVE Final   Influenza B by PCR NEGATIVE NEGATIVE Final    Comment: (NOTE) The Xpert Xpress SARS-CoV-2/FLU/RSV plus assay is intended as an aid in the  diagnosis of influenza from Nasopharyngeal swab specimens and should not be used as a sole basis for treatment. Nasal washings and aspirates are unacceptable for Xpert Xpress SARS-CoV-2/FLU/RSV testing.  Fact Sheet for Patients: BloggerCourse.com  Fact Sheet for Healthcare Providers: SeriousBroker.it  This test is not yet approved or cleared by the [place] FDA and has been authorized for detection and/or diagnosis of SARS-CoV-2 by FDA under an Emergency Use Authorization (EUA). This EUA will remain in effect (meaning this test can be used) for the duration of the COVID-19 declaration under Section 564(b)(1) of the Act, 21 U.S.C. section 360bbb-3(b)(1), unless the authorization is terminated or revoked.     Resp Syncytial Virus by PCR NEGATIVE NEGATIVE Final    Comment: (NOTE) Fact Sheet for Patients: BloggerCourse.com  Fact Sheet for Healthcare Providers: SeriousBroker.it  This test is not yet approved or cleared by the [place] FDA and has been authorized for detection and/or diagnosis of SARS-CoV-2 by FDA under an Emergency Use Authorization (EUA). This EUA will remain in effect (meaning this test can be used) for the duration of the COVID-19 declaration under Section 564(b)(1) of the Act, 21 U.S.C. section 360bbb-3(b)(1), unless the authorization is terminated or revoked.  Performed at [facility], [address] [place], [place] [address]   Blood culture (routine x 2)     Status: None (Preliminary result)   Collection Time: 08/19/23  2:20 AM   Specimen: BLOOD  Result Value Ref Range Status   Specimen Description BLOOD RIGHT ANTECUBITAL  Final   Special Requests   Final    BOTTLES DRAWN AEROBIC AND ANAEROBIC Blood Culture results may not be optimal due to an inadequate volume of blood received in culture bottles   Culture   Final    NO GROWTH 1  DAY Performed at [facility], [address] [place], [place] [address]    Report Status PENDING  Incomplete  MRSA Next Gen by PCR, Nasal     Status: Abnormal  Collection Time: 08/19/23  3:45 PM   Specimen: Nasal Mucosa; Nasal Swab  Result Value Ref Range Status   MRSA by PCR Next Gen DETECTED (A) NOT DETECTED Final    Comment: RESULT CALLED TO, READ BACK BY AND VERIFIED WITH: RN N.MURPHY [id number] @2003  FH (NOTE) The GeneXpert MRSA Assay (FDA approved for NASAL specimens only), is one component of a comprehensive MRSA colonization surveillance program. It is not intended to diagnose MRSA infection nor to guide or monitor treatment for MRSA infections. Test performance is not FDA approved in patients less than 8 years old. Performed at [facility], [address] [place], [place] [address]      Time coordinating discharge: 35 minutes  SIGNED:   Glade Lloyd, MD  [facility] 08/21/2023, 9:48 AM

## 2023-08-21 NOTE — TOC Transition Note (Signed)
Transition of Care [facility]) - Discharge Note   Patient Details  Name: Dale Edwards MRN: [id number] Date of Birth: 1956-04-05  [facility] CM/SW Contact:  Erin Sons, LCSW Phone Number: 08/21/2023, 10:40 AM   Clinical Narrative:     Pt is LTC at [facility] and can admit back there today.   Per MD patient ready for DC to [facility]. RN, patient,  and facility notified of DC. Discharge Summary and FL2 sent to facility. RN to call report prior to discharge [phone]). DC packet on chart. Ambulance transport requested for patient.   CSW will sign off for now as social work intervention is no longer needed. Please consult [place] again if new needs arise.   Final next level of care: Skilled Nursing Facility Barriers to Discharge: No Barriers Identified         Discharge Placement                Patient to be transferred to facility by: [facility]   Patient and family notified of of transfer: 08/21/23  Discharge Plan and Services Additional resources added to the After Visit Summary for                                       Social Drivers of Health (SDOH) Interventions SDOH Screenings   Food Insecurity: No Food Insecurity (08/19/2023)  Housing: Low Risk  (08/19/2023)  Transportation Needs: No Transportation Needs (08/19/2023)  Utilities: Not At Risk (08/19/2023)  Social Connections: Socially Isolated (08/19/2023)  Tobacco Use: High Risk (08/19/2023)     Readmission Risk Interventions     No data to display

## 2023-08-21 NOTE — Plan of Care (Signed)

## 2023-08-24 LAB — CULTURE, BLOOD (ROUTINE X 2)
Culture: NO GROWTH
Culture: NO GROWTH
Special Requests: ADEQUATE

## 2023-09-01 ENCOUNTER — Encounter: Payer: Self-pay | Admitting: Neurology

## 2023-09-01 ENCOUNTER — Ambulatory Visit (INDEPENDENT_AMBULATORY_CARE_PROVIDER_SITE_OTHER): Payer: Medicare (Managed Care) | Admitting: Neurology

## 2023-09-01 VITALS — BP 127/81 | HR 97 | Ht 66.0 in | Wt 169.8 lb

## 2023-09-01 DIAGNOSIS — G35 Multiple sclerosis: Secondary | ICD-10-CM | POA: Diagnosis not present

## 2023-09-01 DIAGNOSIS — Z9889 Other specified postprocedural states: Secondary | ICD-10-CM | POA: Diagnosis not present

## 2023-09-01 DIAGNOSIS — S065XAA Traumatic subdural hemorrhage with loss of consciousness status unknown, initial encounter: Secondary | ICD-10-CM

## 2023-09-01 NOTE — Progress Notes (Signed)
Chief Complaint  Patient presents with   Multiple Sclerosis    Rm15, alone, f/u MS:pt stated been a while since infusion. Right hand tremor, gait abnormality      ASSESSMENT AND PLAN  Dale Edwards is a 68 y.o. male   Subdural hematoma status post right craniotomy in June 2023 Relapsing remitting multiple sclerosis  Gait abnormality  Was diagnosed in 2010, presenting with right arm clumsiness, gait abnormality  Tecfidera from August 2014 30 August 2016, Tysabri from February 2018 to March 2022,  Stopped Tysabri since March 2022, No significant clinical flareup since stopped Tysabri              Will repeat MRI of the brain with and without contrast  I will call him MRI report, if there is worsening, may consider restart long-term immunomodulation therapy, if stable, may continue observe him  DIAGNOSTIC DATA (LABS, IMAGING, TESTING) - I reviewed patient records, labs, notes, testing and imaging myself where available.   MEDICAL HISTORY:  Dale Edwards, follow-up for relapsing remitting multiple sclerosis  History is obtained from the patient and review of electronic medical records. I personally reviewed pertinent available imaging films in PACS.   PMHx of  Smoker 1ppd COPD Subdural hematoma, status post right craniotomy in June 2023  He presented with right hand difficulty since 2010, he complains of right hand shaking, especially when he trying to use his right hand, such as holding a utensil, he has to hold his spoon like a child to prevent the food from spitting out, progressively worse over the past one year, he also noticed right leg shaking, especially when he bearing weight since 2013, mild gait difficulty dragging his right leg, he suffered a sudden fell to his right leg in 2007, with right tibial fracture,   He has chronic low back pain, but denies significant shooting pain from back to his lower extremity, he denies bilateral extremity paresthesia, he  denies bowel and bladder   MRI brain 2014 showed mild-moderate periventricular and subcortical round and ovoid T2 hyperintensities, some of which are confluent.   MRI cervical 2014, spine At C2-C3, there is posterior central T2 hyperintense spinal cord lesion.   Above findings are consistent with multiple sclerosis.     He has no clear clinical relapsing and remitting, over the past few years, continue gradually worsening gait difficulty especially the right arm and the right leg difficulty.     Laboratory evaluation showed normal or negative vitamin B12, RPR, folic acid, ANA, HIV, C. reactive protein, TSH, CPK, Normal or negative Lyme titer, ACE level,   hepatitis panel,     Spinal fluid testing showed WBC 1, glucose 44, total protein of 56, more than 5 oligoclonal banding, and increased IgG synthetic rate, increased IgG index,     He had a past medical history of chronic low back pain, right leg pain, knee pain, COPD, has been on disability since March 2014 due to physical difficulty, right side difficulty   He was started on Tecfidera since August  2014.   JC virus was positive with titer of 0.8 4 in August 01 2014    He was switched to Tysabri IV infusion since February 2018,     There was no significant flareups over the years, he has slow decline in his functional status, in creased right hand shaking, gait abnormalities   Personally reviewed MRI of the brain with without contrast October 2020, stable moderate supratentorium MS plaque, no contrast-enhancement,   MRI  of the cervical spine in May 2016, ill-defined spinal cord hyperintensity posterior at C2-3, likely chronic demyelinating plaque, no enhancing lesions, mild degenerative changes   He has lost his significant other in February 2022, no longer has transportation, was not able to continue his Tysabri infusion since 20-Oct-2023, he did not notice any significant decline in his functional status,  UPDATE Sep 01 2023: He has quit  his [employer] infusions since 03/24/22his wife died of heart attack, he lived alone for a while, but had a significant decline in his functional status, suffered multiple fall, hospital admission in June 2023 for right craniotomy for right subdural hematoma, after that hospital admission he was discharged to a nursing home [facility], not ambulate with a walker, no seizure-like episode, no significant flareup of MS  Hospital admission on January 21 through 23, 2025 for increased the fall, shortness of breath, chest x-ray showed cardiomegaly with vascular congestion, mild pulmonary edema, positive for influenza A  Now feeling better after 5 days of Tamiflu, also had temporal IV Solu-Medrol, followed by steroid tapering  He smoked a pack a day, rely on his walker minimal, no significant flareup of his MS symptoms,    PHYSICAL EXAM:   Vitals:   09/01/23 1002  BP: 127/81  Pulse: 97  Weight: 169 lb 12.1 oz (77 kg)  Height: 5\' 6"  (1.676 m)      Body mass index is 27.4 kg/m.  PHYSICAL EXAMNIATION:  Gen: NAD, conversant, well nourised, well groomed                     Cardiovascular: Regular rate rhythm, no peripheral edema, warm, nontender. Eyes: Conjunctivae clear without exudates or hemorrhage Neck: Supple, no carotid bruits. Pulmonary: Clear to auscultation bilaterally   NEUROLOGICAL EXAM:  MENTAL STATUS: Speech/cognition: Depressed looking middle-age male, awake, alert, oriented to history taking and casual conversation CRANIAL NERVES: CN II: Visual fields are full to confrontation. Pupils are round equal and briskly reactive to light. CN III, IV, VI: extraocular movement are normal. No ptosis. CN V: Facial sensation is intact to light touch CN VII: Face is symmetric with normal eye closure  CN VIII: Hearing is normal to causal conversation. CN IX, X: Phonation is normal. CN XI: Head turning and shoulder shrug are intact  MOTOR: Right hand intention tremor, mild  weakness, fixation on rapid rotating movement  REFLEXES: Brisk and symmetric  SENSORY: Intact to light touch,    COORDINATION: Significant right finger-to-nose dysmetria, mild right heel-to-shin dysmetria  GAIT/STANCE: Push-up to get up from seated position wide-based, unsteady dragging right leg  REVIEW OF SYSTEMS:  Full 14 system review of systems performed and notable only for as above All other review of systems were negative.   ALLERGIES: Allergies  Allergen Reactions   Hydrocodone-Acetaminophen Itching and Nausea Only         HOME MEDICATIONS: Current Outpatient Medications  Medication Sig Dispense Refill   acetaminophen (TYLENOL) 500 MG tablet Take 2 tablets (1,000 mg total) by mouth every 6 (six) hours as needed for mild pain or moderate pain. (Patient taking differently: Take 1,000 mg by mouth every 8 (eight) hours as needed for mild pain (pain score 1-3) or moderate pain (pain score 4-6).) 30 tablet 0   albuterol (VENTOLIN HFA) 108 (90 Base) MCG/ACT inhaler Inhale 2 puffs into the lungs every 6 (six) hours as needed for wheezing or shortness of breath. 8.5 g 1   aspirin EC 81 MG tablet Take 81  mg by mouth daily. Swallow whole.     cholecalciferol (VITAMIN D3) 25 MCG (1000 UNIT) tablet Take 1,000 Units by mouth daily.     cyclobenzaprine (FLEXERIL) 10 MG tablet Take 1 tablet (10 mg total) by mouth 2 (two) times daily as needed for muscle spasms. 20 tablet 0   fluticasone-salmeterol (ADVAIR) 500-50 MCG/ACT AEPB Inhale 1 puff into the lungs in the morning and at bedtime.     gabapentin (NEURONTIN) 300 MG capsule Take 300 mg by mouth at bedtime.     guaiFENesin-dextromethorphan (ROBITUSSIN DM) 100-10 MG/5ML syrup Take 10 mLs by mouth every 6 (six) hours as needed for cough. 236 mL 0   loratadine (CLARITIN) 10 MG tablet Take 10 mg by mouth daily.     rosuvastatin (CRESTOR) 10 MG tablet Take 10 mg by mouth at bedtime.     No current facility-administered medications for this  visit.    PAST MEDICAL HISTORY: Past Medical History:  Diagnosis Date   Back pain    COPD (chronic obstructive pulmonary disease) (HCC)    Dyspnea    Heart murmur    Multiple sclerosis (HCC)    Tremors of nervous system    Right    PAST SURGICAL HISTORY: Past Surgical History:  Procedure Laterality Date   ADENOIDECTOMY     broken leg     CRANIOTOMY Right 01/06/2022   Procedure: Right Craniotomy for Subdural Hematoma;  Surgeon: Tia Alert, MD;  Location: [facility];  Service: Neurosurgery;  Laterality: Right;  RM 21   TONSILLECTOMY      FAMILY HISTORY: Family History  Problem Relation Age of Onset   Heart Problems Mother     SOCIAL HISTORY: Social History   Socioeconomic History   Marital status: Single    Spouse name: Not on file   Number of children: 0   Years of education: GED   Highest education level: Not on file  Occupational History    Employer: UNEMPLOYED    Comment: Disabled  Tobacco Use   Smoking status: Every Day    Current packs/day: 0.50    Types: Cigarettes   Smokeless tobacco: Never   Tobacco comments:    Half Pack  Vaping Use   Vaping status: Never Used  Substance and Sexual Activity   Alcohol use: Yes    Alcohol/week: 1.0 standard drink of alcohol    Types: 1 Standard drinks or equivalent per week    Comment: quit 2 years ago..   pt is now drinking on occ - very rare maybe once a month.   Drug use: No   Sexual activity: Not Currently  Other Topics Concern   Not on file  Social History Narrative   Patient lives at home with Rogene Houston.   Patient does not have a significant other.    Patient is disabled.   Patient has no children.    Patient has his GED   Right handed.   Caffeine - four glasses and some sodas   Social Drivers of Corporate investment banker Strain: Not on file  Food Insecurity: No Food Insecurity (08/19/2023)   Hunger Vital Sign    Worried About Running Out of Food in the Last Year: Never true    Ran Out of  Food in the Last Year: Never true  Transportation Needs: No Transportation Needs (08/19/2023)   PRAPARE - Administrator, Civil Service (Medical): No    Lack of Transportation (Non-Medical): No  Physical Activity: Not  on file  Stress: Not on file  Social Connections: Socially Isolated (08/19/2023)   Social Connection and Isolation Panel [NHANES]    Frequency of Communication with Friends and Family: Twice a week    Frequency of Social Gatherings with Friends and Family: Never    Attends Religious Services: Never    Database administrator or Organizations: No    Attends Banker Meetings: Never    Marital Status: Never married  Intimate Partner Violence: Not At Risk (08/19/2023)   Humiliation, Afraid, Rape, and Kick questionnaire    Fear of Current or Ex-Partner: No    Emotionally Abused: No    Physically Abused: No    Sexually Abused: No      Levert Feinstein, M.D. Ph.D.  [facility] [address], [address] [place], [place] [address] Ph: [phone] Fax: [phone]  CC:  No referring provider defined for this encounter.  Pcp, No

## 2023-09-02 ENCOUNTER — Telehealth: Payer: Self-pay | Admitting: Neurology

## 2023-09-02 NOTE — Telephone Encounter (Signed)
medicare/medicaid NPR sent to [facility] [phone]

## 2023-10-13 ENCOUNTER — Ambulatory Visit
Admission: RE | Admit: 2023-10-13 | Discharge: 2023-10-13 | Disposition: A | Discharge status: Home / Self Care | Payer: Medicare (Managed Care) | Source: Ambulatory Visit | Attending: Neurology | Admitting: Neurology

## 2023-10-13 DIAGNOSIS — S065XAA Traumatic subdural hemorrhage with loss of consciousness status unknown, initial encounter: Secondary | ICD-10-CM | POA: Diagnosis not present

## 2023-10-13 DIAGNOSIS — G35 Multiple sclerosis: Secondary | ICD-10-CM | POA: Diagnosis not present

## 2023-10-13 DIAGNOSIS — Z9889 Other specified postprocedural states: Secondary | ICD-10-CM

## 2023-10-13 MED ORDER — GADOPICLENOL 0.5 MMOL/ML IV SOLN
7.5000 mL | Freq: Once | INTRAVENOUS | Status: AC | PRN
  Administered 2023-10-13: 7.5 mL via INTRAVENOUS

## 2023-10-16 ENCOUNTER — Telehealth: Payer: Self-pay

## 2023-10-16 NOTE — Telephone Encounter (Signed)
-----   Message from Levert Feinstein sent at 10/16/2023 10:49 AM EDT ----- Please call patient, MRI of the brain showed no acute abnormality  stable chronic multiple sclerosis lesion  Evidence of right craniotomy, resolved subdural hematoma.  Moderate generalized atrophy, slight progression compared to previous study.

## 2023-10-20 ENCOUNTER — Telehealth: Payer: Self-pay

## 2023-10-20 NOTE — Telephone Encounter (Signed)
-----   Message from Levert Feinstein sent at 10/16/2023 10:49 AM EDT ----- Please call patient, MRI of the brain showed no acute abnormality  stable chronic multiple sclerosis lesion  Evidence of right craniotomy, resolved subdural hematoma.  Moderate generalized atrophy, slight progression compared to previous study.

## 2023-10-20 NOTE — Telephone Encounter (Signed)
 Called the number on file for the pt and was informed that he no longer has the number [phone]. Tried calling angela king [phone] as listed on dpr but that must not be an active number either. Routing to check in staff to see if they know any other wat to contact him

## 2023-11-05 IMAGING — CT CT HEAD W/O CM
4 series · 16 of 47 positions shown, 18 images · non-contrast
Comparison: Head CT 01/05/2022

CLINICAL DATA: Subdural hematoma.



[Series 2: head wo · axial · 0.46mm/px · z∈[-147,-27]mm · 7 of 33 slices shown, 9 images]
[im 5/33  brain]
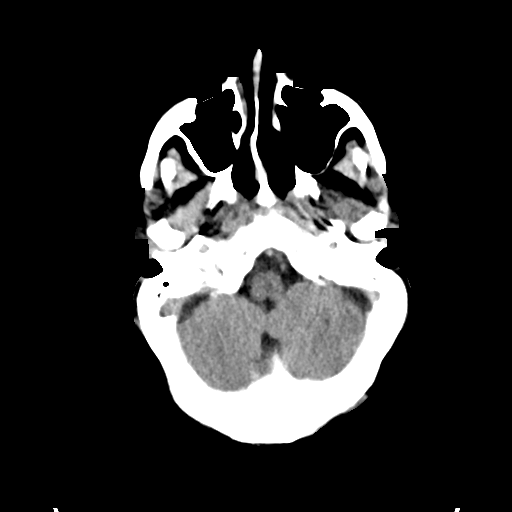
[im 5/33  bone]
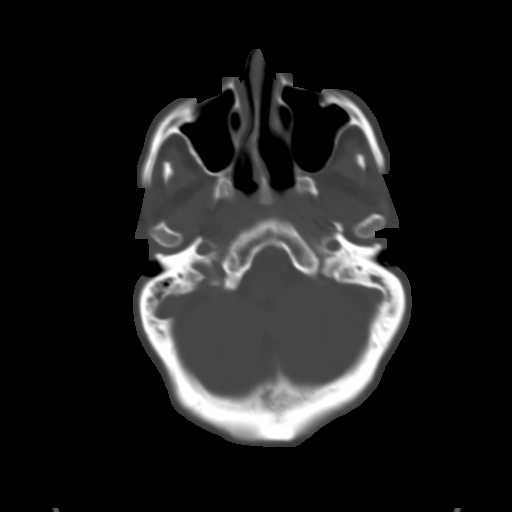
[im 9/33  brain]
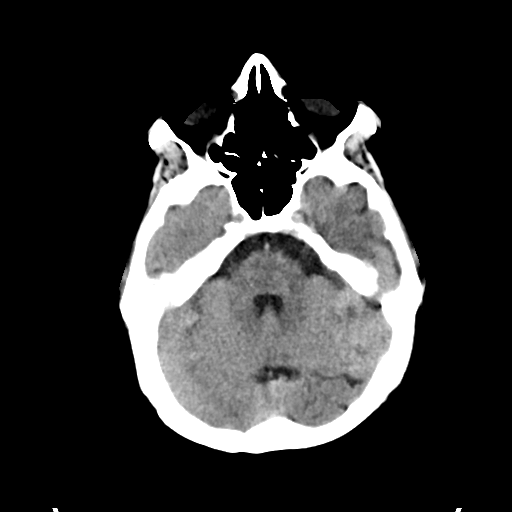
[im 13/33  brain]
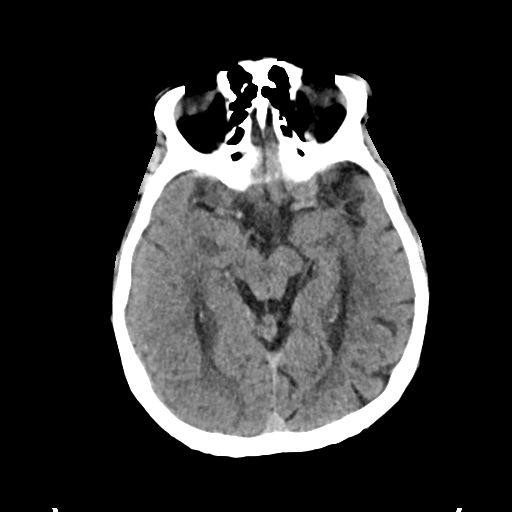
[im 17/33  brain]
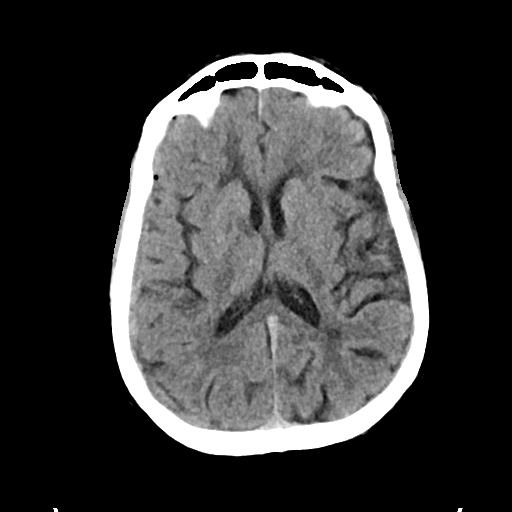
[im 21/33  brain]
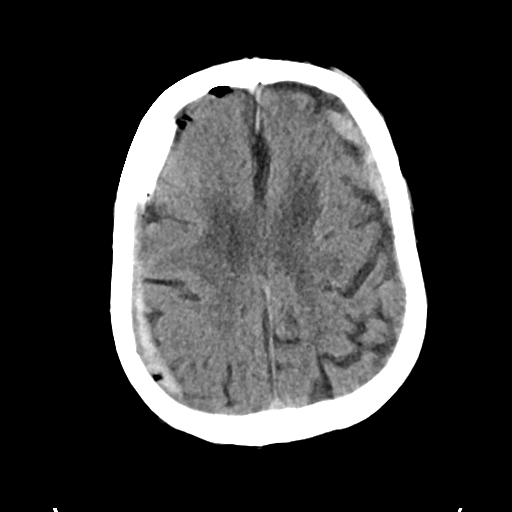
[im 21/33  bone]
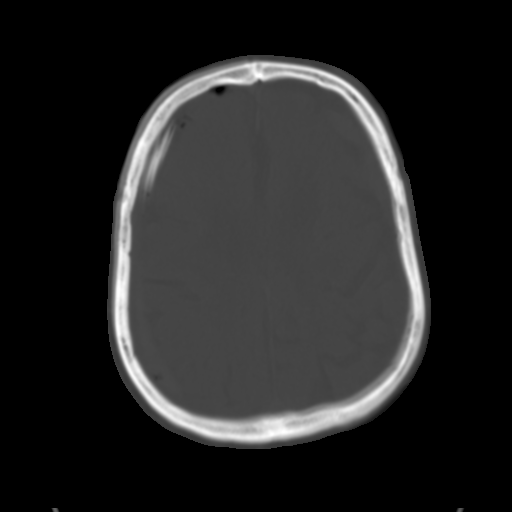
[im 25/33  brain]
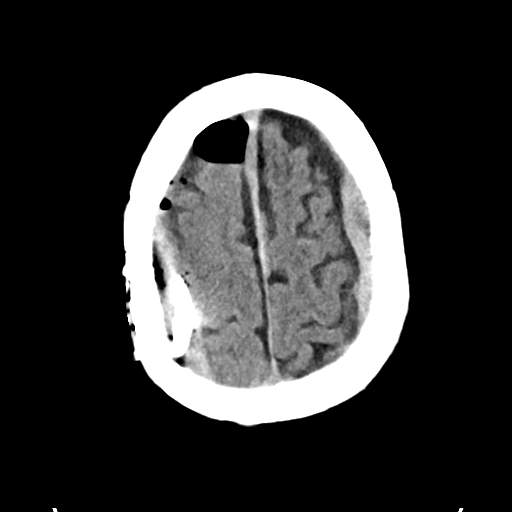
[im 29/33  brain]
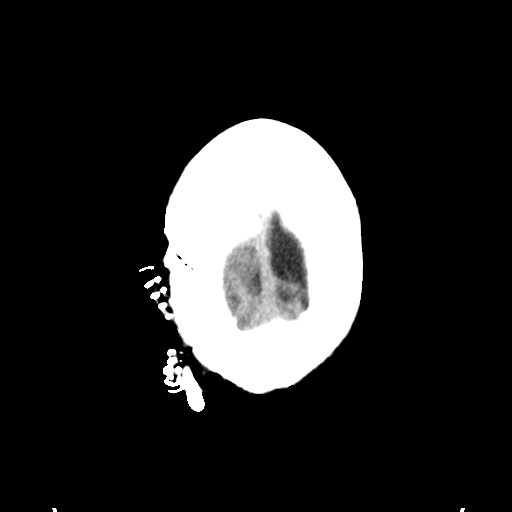

[Series 3: head bone · axial · 0.46mm/px · z∈[-151,-119]mm · 3 of 83 slices shown]
[im 9/83  bone]
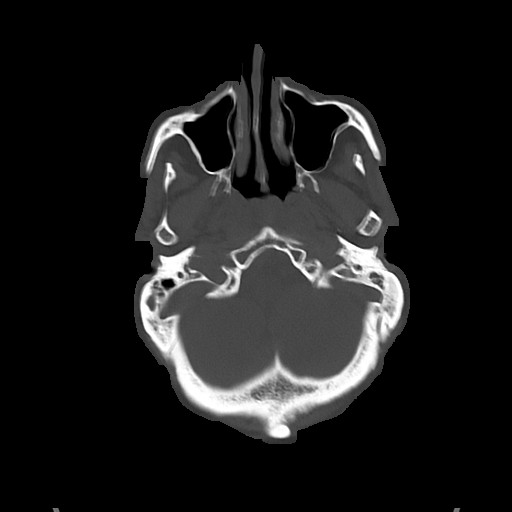
[im 17/83  bone]
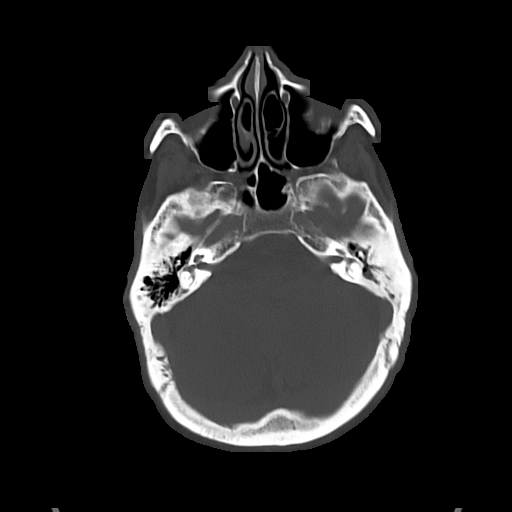
[im 25/83  bone]
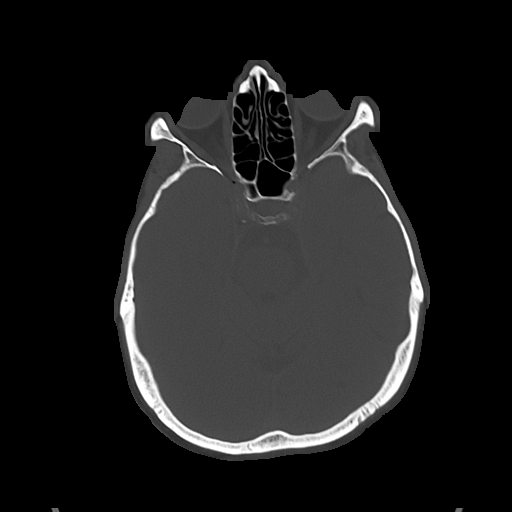

[Series 4: cor soft · coronal · 0.32mm/px · 3 of 68 slices shown]
[im 23/68  brain]
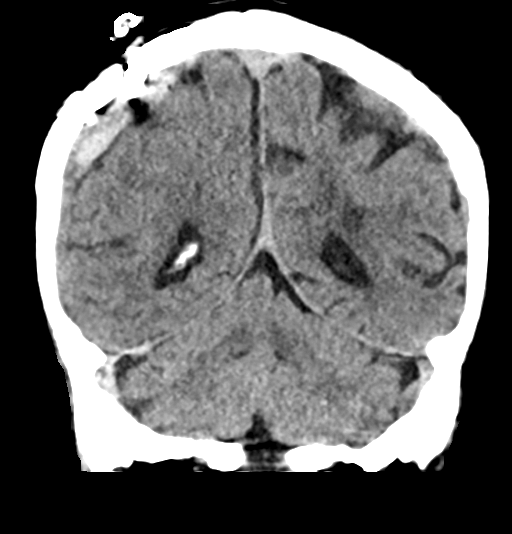
[im 30/68  brain]
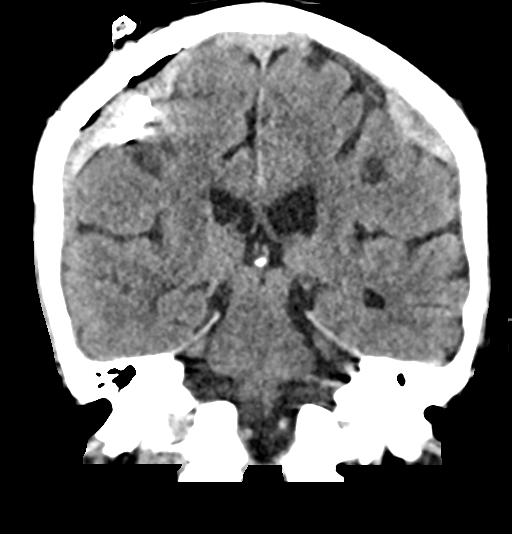
[im 38/68  brain]
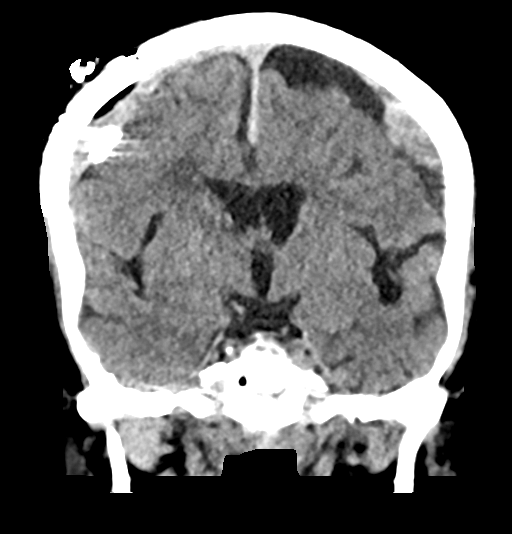

[Series 5: sag soft · sagittal · 0.34mm/px · 3 of 55 slices shown]
[im 19/55  brain]
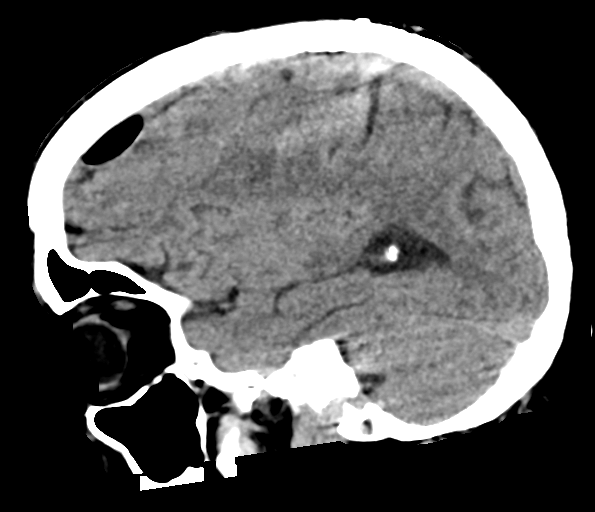
[im 28/55  brain]
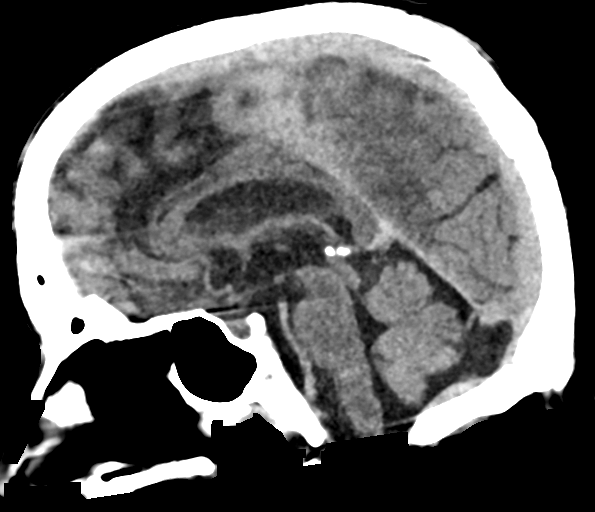
[im 37/55  brain]
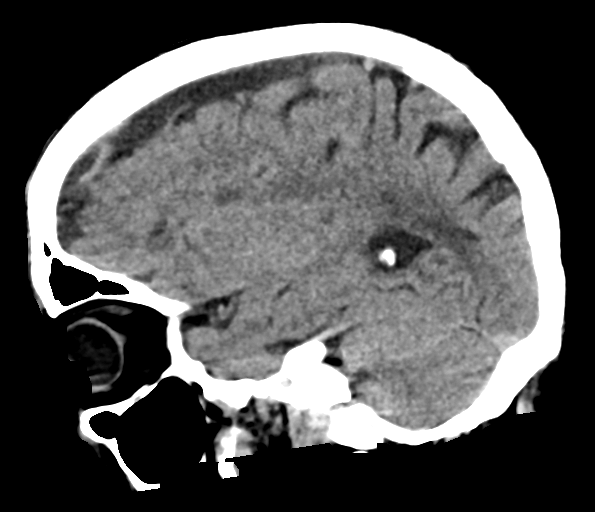

[16 of 47 positions shown; findings below may reference images not displayed]

FINDINGS: Brain: Sequelae of interval right parietal craniotomy for subdural
hematoma evacuation are identified. A subdural drain, Gel-Foam, and
small volume pneumocephalus are noted. Residual mixed density
subdural fluid collection/hematoma over the right cerebral convexity
measures up to 1 cm in thickness. Mass effect on the right cerebral
hemisphere has greatly decreased, and midline shift has resolved. A
pre-existing small subdural collection over the left cerebral
convexity has mildly enlarged and now contains hyperdense blood
products consistent with interval hemorrhage, and this collection
now measures up to 1.2 cm in thickness with mild mass effect on the
left frontal and parietal lobes. A small amount of new/acute
subdural hemorrhage is also noted along the falx measuring up to 5
mm in thickness. No acute infarct or hydrocephalus is evident.
Patchy hypodensities in the cerebral white matter bilaterally are
similar to the prior CT and are nonspecific but compatible with
moderate chronic small vessel ischemic disease.

Vascular: Calcified atherosclerosis at the skull base. No hyperdense
vessel.

Skull: Right parietal craniotomy.  Overlying skin staples.

Sinuses/Orbits: Clear paranasal sinuses. Bilateral mastoid
effusions. Unremarkable orbits.

Other: None.
IMPRESSION: 1. Interval right-sided subdural hematoma evacuation. Residual mixed
density subdural collection as above with greatly decreased mass
effect and resolved midline shift.
2. New acute hemorrhage within a pre-existing small left-sided
subdural hematoma and along the falx. Only mild associated mass
effect.
3. Moderate chronic small vessel ischemic disease.

These results will be called to the ordering clinician or
representative by the Radiologist Assistant, and communication
documented in the PACS or [REDACTED].

## 2023-11-08 IMAGING — CT CT HEAD W/O CM
4 series · 15 of 47 positions shown, 17 images · non-contrast
Comparison: Head CT 01/07/2022 and earlier.

CLINICAL DATA: 65-year-old male postoperative day 3 status post
right craniotomy, subdural hematoma evacuation.



[Series 4: head bone · axial · 0.45mm/px · z∈[-119,-103]mm · 2 of 81 slices shown]
[im 9/81  bone]
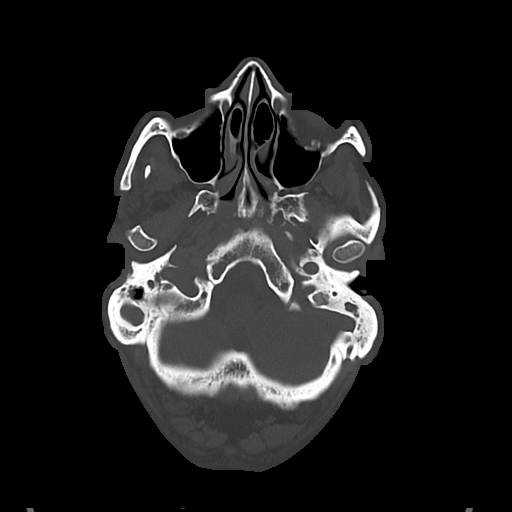
[im 17/81  bone]
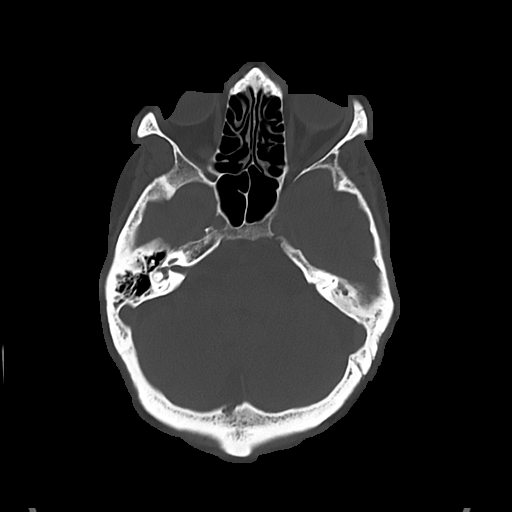

[Series 5: head wo · axial · 0.45mm/px · z∈[-115,+5]mm · 7 of 33 slices shown, 9 images]
[im 5/33  brain]
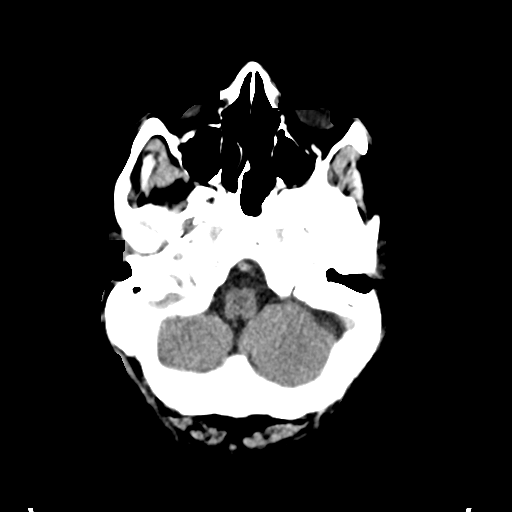
[im 5/33  bone]
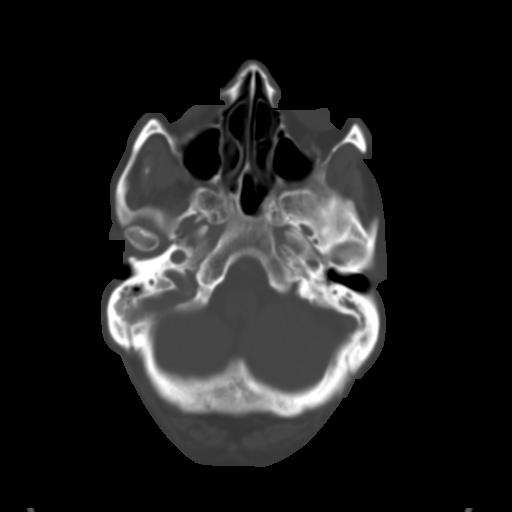
[im 9/33  brain]
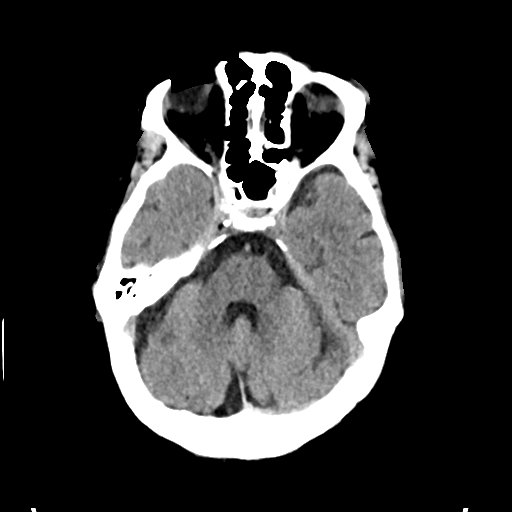
[im 13/33  brain]
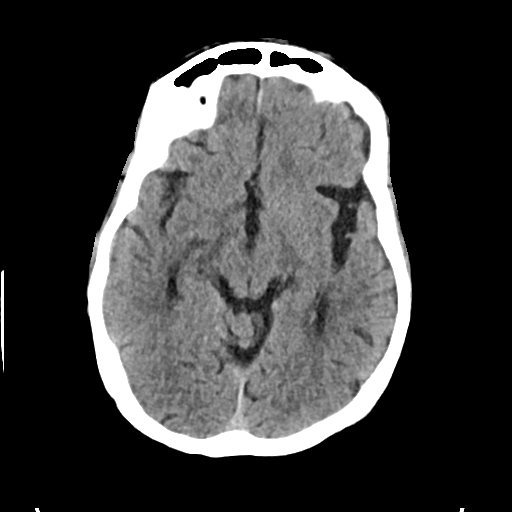
[im 17/33  brain]
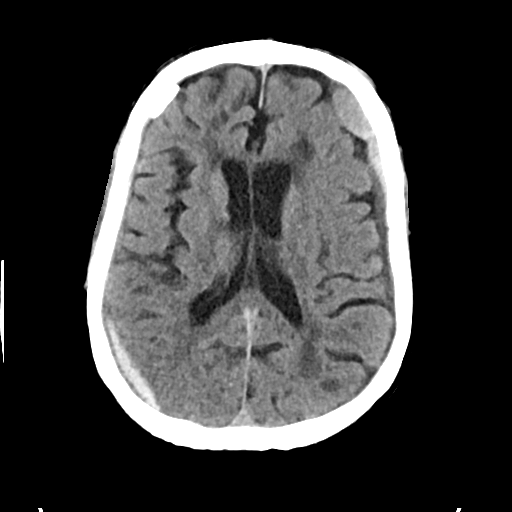
[im 21/33  brain]
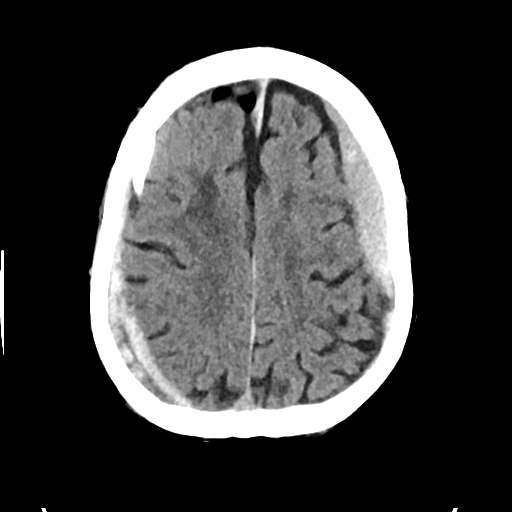
[im 21/33  bone]
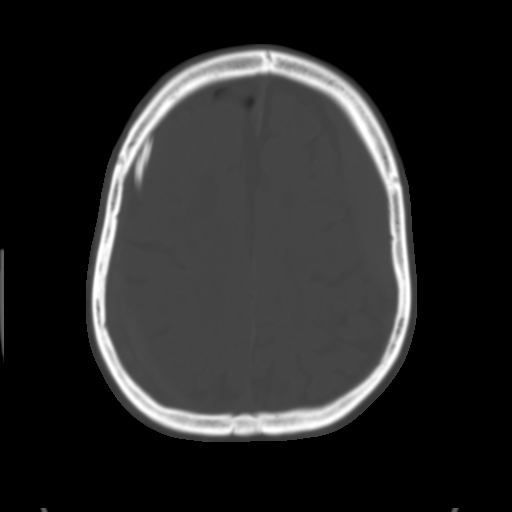
[im 25/33  brain]
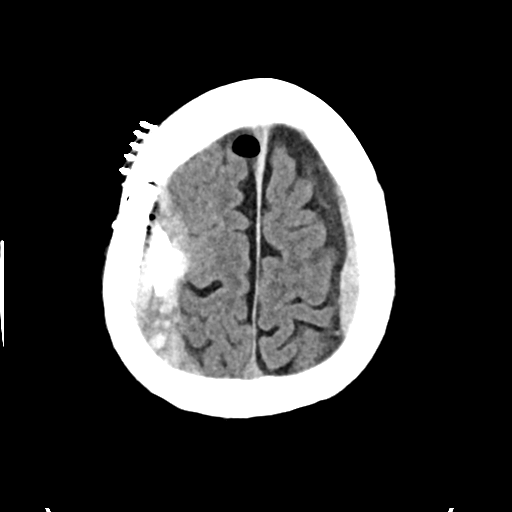
[im 29/33  brain]
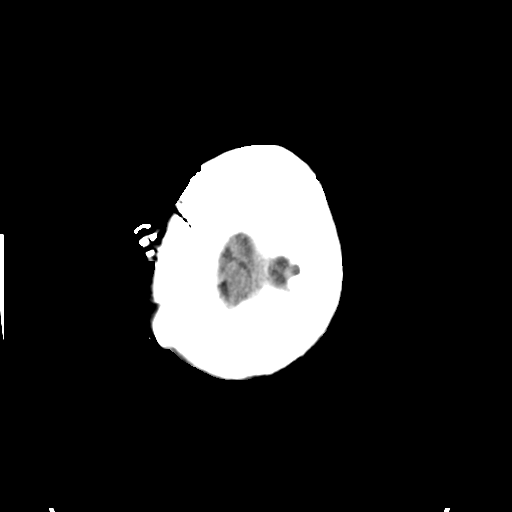

[Series 6: cor soft · coronal · 0.33mm/px · 3 of 69 slices shown]
[im 23/69  brain]
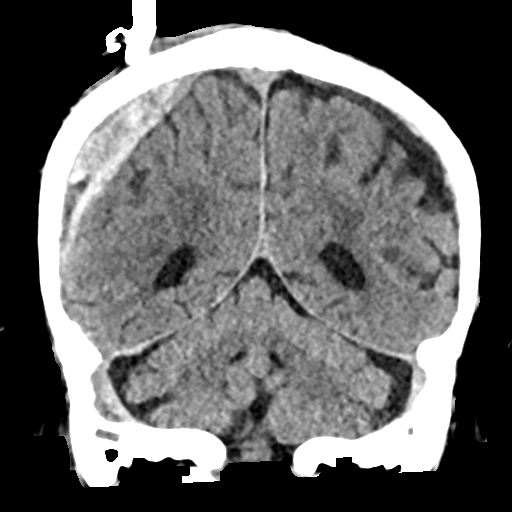
[im 31/69  brain]
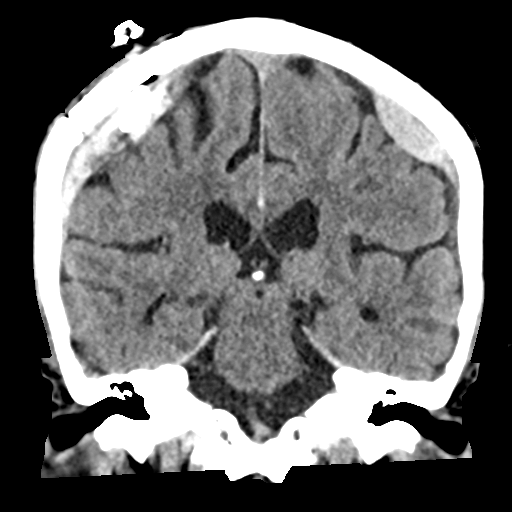
[im 38/69  brain]
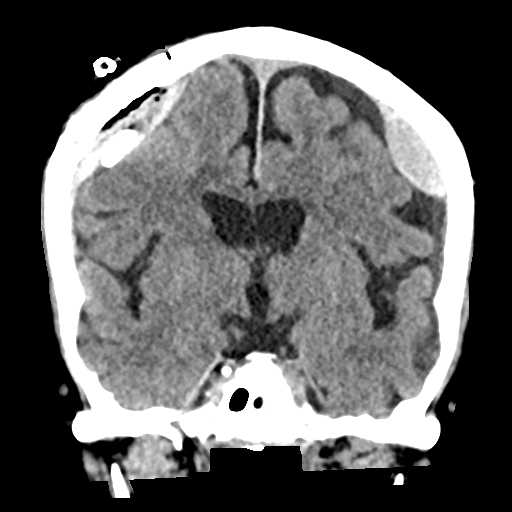

[Series 7: sag soft · sagittal · 0.33mm/px · 3 of 54 slices shown]
[im 18/54  brain]
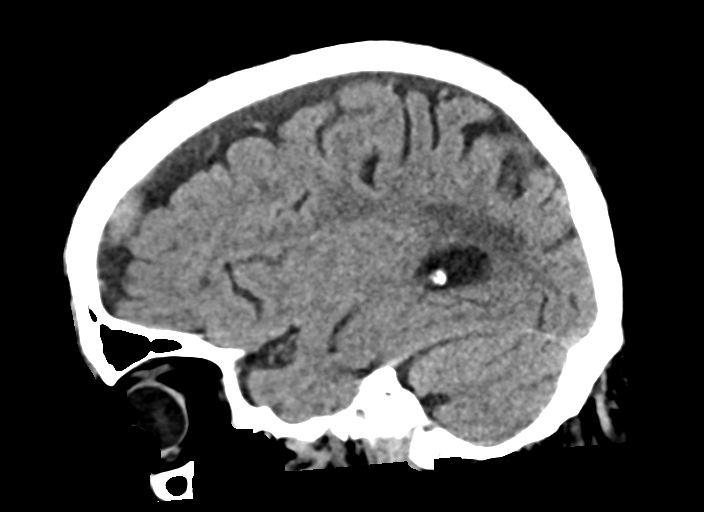
[im 27/54  brain]
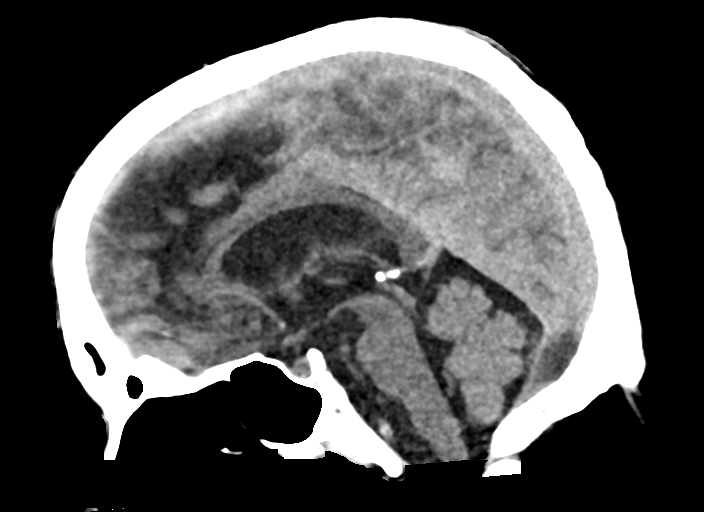
[im 36/54  brain]
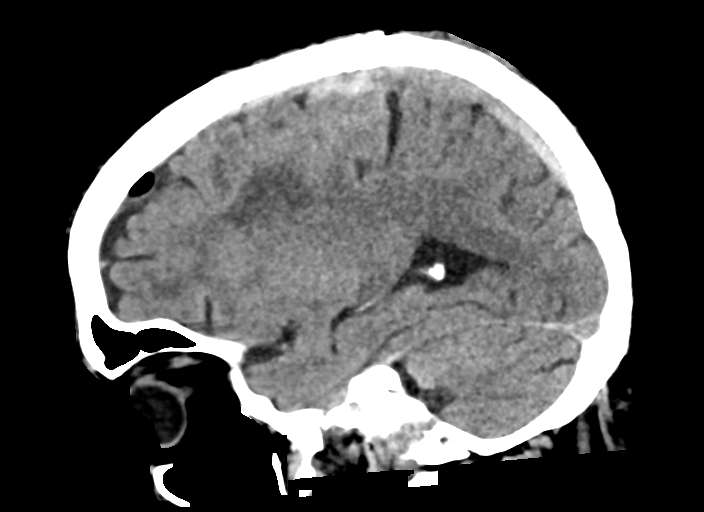

[15 of 47 positions shown; findings below may reference images not displayed]

FINDINGS: Brain: Percutaneous subdural drain remains in place on the right.
Underlying mixed density right superior convexity subdural hematoma
with a small volume of postoperative pneumocephalus has decreased
from about 19 mm in thickness to 15 mm now (coronal image 36).
Additional right side para falcine blood anteriorly is stable and
small volume.

Contralateral lobulated and mostly hyperdense left side subdural
hematoma is stable measuring up to 14 mm in thickness (coronal image
25).

Stable mass effect on the superior cerebral convexities with
maintained normal basilar cisterns and only trace rightward midline
shift. No ventriculomegaly or IVH. No new areas of intracranial
hemorrhage identified. Patchy and confluent hypodensity in the
bilateral cerebral white matter, right basal ganglia are stable. No
cortically based acute infarct identified.

Vascular: Calcified atherosclerosis at the skull base.

Skull: Right parietal craniotomy. No acute osseous abnormality
identified.

Sinuses/Orbits: Unchanged left mastoid opacification. Other
Visualized paranasal sinuses and mastoids are stable and well
aerated.

Other: Mildly Disconjugate gaze, otherwise negative orbits. Stable
postoperative changes to the superior right scalp convexity. Skin
staples remain in place.
IMPRESSION: 1. Right side subdural drain remains in place with superior
convexity subdural hematoma on that side decreased to 15 mm
thickness now.
2. Contralateral lobulated left superior convexity subdural is
stable measuring up to 14 mm.
3. Stable mass effect on the superior cerebral convexities, with
normal basilar cisterns and only trace rightward midline shift.
4. No new intracranial abnormality.

## 2024-02-05 ENCOUNTER — Ambulatory Visit: Payer: Medicare (Managed Care) | Admitting: Neurology

## 2024-02-20 ENCOUNTER — Ambulatory Visit (INDEPENDENT_AMBULATORY_CARE_PROVIDER_SITE_OTHER): Payer: Medicare (Managed Care) | Admitting: Neurology

## 2024-02-20 ENCOUNTER — Encounter: Payer: Self-pay | Admitting: Neurology

## 2024-02-20 VITALS — BP 103/60 | HR 77 | Ht 66.0 in | Wt 200.0 lb

## 2024-02-20 DIAGNOSIS — G35 Multiple sclerosis: Secondary | ICD-10-CM | POA: Diagnosis not present

## 2024-02-20 DIAGNOSIS — R269 Unspecified abnormalities of gait and mobility: Secondary | ICD-10-CM

## 2024-02-20 DIAGNOSIS — Z9889 Other specified postprocedural states: Secondary | ICD-10-CM

## 2024-02-20 DIAGNOSIS — S065XAA Traumatic subdural hemorrhage with loss of consciousness status unknown, initial encounter: Secondary | ICD-10-CM | POA: Diagnosis not present

## 2024-02-20 NOTE — Progress Notes (Signed)
 Chief Complaint  Patient presents with   Follow-up    Pt in room 14. Alone. Here for MS follow up. Pt reports doing well, no falls, feels fatigue.       ASSESSMENT AND PLAN  Dale Edwards is a 68 y.o. male   Subdural hematoma status post right craniotomy in June 2023 Multiple sclerosis  Gait abnormality  His MS was diagnosed in 2010, presenting with right arm clumsiness, gait abnormality  Tecfidera  from August 2014 to February 2018, Tysabri  from February 2018 to March 2022, Stopped Tysabri  since March 2022, No significant clinical flareup since stopped Tysabri  Repeat MRI of the brain in March 2025 showed stable moderate to severe chronic demyelinating plaques, no significant change compared to previous scan in 2020, mild progression of generalized atrophy, resolved right subdural hematoma, evidence of right craniotomy,  He entered second progressive MS, less likely benefit long-term immunomodulation therapy,  Encouraging moderate exercise, weight loss,  Continue follow-up with primary care only return to clinic for new issues  DIAGNOSTIC DATA (LABS, IMAGING, TESTING) - I reviewed patient records, labs, notes, testing and imaging myself where available.   MEDICAL HISTORY:  Dale Edwards, follow-up for relapsing remitting multiple sclerosis  History is obtained from the patient and review of electronic medical records. I personally reviewed pertinent available imaging films in PACS.   PMHx of  Smoker 1ppd COPD Subdural hematoma, status post right craniotomy in June 2023  He presented with right hand difficulty since 2010, he complains of right hand shaking, especially when he trying to use his right hand, such as holding a utensil, he has to hold his spoon like a child to prevent the food from spitting out, progressively worse over the past one year, he also noticed right leg shaking, especially when he bearing weight since 2013, mild gait difficulty dragging his right  leg, he suffered a sudden fell to his right leg in 2007, with right tibial fracture,   He has chronic low back pain, but denies significant shooting pain from back to his lower extremity, he denies bilateral extremity paresthesia, he denies bowel and bladder   MRI brain 2014 showed mild-moderate periventricular and subcortical round and ovoid T2 hyperintensities, some of which are confluent.   MRI cervical 2014, spine At C2-C3, there is posterior central T2 hyperintense spinal cord lesion.   Above findings are consistent with multiple sclerosis.     He has no clear clinical relapsing and remitting, over the past few years, continue gradually worsening gait difficulty especially the right arm and the right leg difficulty.     Laboratory evaluation showed normal or negative vitamin B12, RPR, folic acid , ANA, HIV, C. reactive protein, TSH, CPK, Normal or negative Lyme titer, ACE level,   hepatitis panel,     Spinal fluid testing showed WBC 1, glucose 44, total protein of 56, more than 5 oligoclonal banding, and increased IgG synthetic rate, increased IgG index,     He had a past medical history of chronic low back pain, right leg pain, knee pain, COPD, has been on disability since March 2014 due to physical difficulty, right side difficulty   He was started on Tecfidera  since August  2014.   JC virus was positive with titer of 0.84 in August 01 2014    He was switched to Tysabri  IV infusion since February 2018,     There was no significant flareups over the years, he has slow decline in his functional status, in creased right hand shaking, gait  abnormalities   Personally reviewed MRI of the brain with without contrast October 2020, stable moderate supratentorium MS plaque, no contrast-enhancement,   MRI of the cervical spine in May 2016, ill-defined spinal cord hyperintensity posterior at C2-3, likely chronic demyelinating plaque, no enhancing lesions, mild degenerative changes   He has lost  his significant other in February 2022, no longer has transportation, was not able to continue his Tysabri  infusion since 11/19/2023, he did not notice any significant decline in his functional status,  UPDATE Sep 01 2023: He has quit his Meliton infusions since 2022/04/23his wife died of heart attack, he lived alone for a while, but had a significant decline in his functional status, suffered multiple fall, hospital admission in June 2023 for right craniotomy for right subdural hematoma, after that hospital admission he was discharged to a nursing home [facility], not ambulate with a walker, no seizure-like episode, no significant flareup of MS  Hospital admission on January 21 through 23, 2025 for increased the fall, shortness of breath, chest x-ray showed cardiomegaly with vascular congestion, mild pulmonary edema, positive for influenza A  Now feeling better after 5 days of Tamiflu , also had temporal IV Solu-Medrol , followed by steroid tapering  He smoked a pack a day, rely on his walker minimal, no significant flareup of his MS symptoms,  UPDATE February 19 2025: He lives at nursing home, increased walking difficulty, relies on his walker, had weight gain, I recommended physical therapy, he does not want to go through that  Personally reviewed MRI of the brain with without contrast from 04-23-2025chronic demyelinating plaque no significant change compared to 2020, no contrast-enhancement, the subdural hematoma noted from previous imaging study has resolved, evidence of previous right craniotomy, moderate generalized atrophy, mild progression  Based on his long chronic course, no recent flareup, I do not think he needs long-term immunomodulation therapy for his MS, he likely entered into secondary progressive MS, slow decline, no clear relapsing remitting episode    PHYSICAL EXAM:   Vitals:   02/20/24 0945  BP: 103/60  Pulse: 77  Weight: 200 lb (90.7 kg)  Height: 5' 6 (1.676 m)     Body mass index is 32.28 kg/m.  PHYSICAL EXAMNIATION:  Gen: NAD, conversant, well nourised, well groomed                     Cardiovascular: Regular rate rhythm, no peripheral edema, warm, nontender. Eyes: Conjunctivae clear without exudates or hemorrhage Neck: Supple, no carotid bruits. Pulmonary: Clear to auscultation bilaterally   NEUROLOGICAL EXAM:  MENTAL STATUS: Speech/cognition: Depressed looking middle-age male, awake, alert, oriented to history taking and casual conversation CRANIAL NERVES: CN II: Visual fields are full to confrontation. Pupils are round equal and briskly reactive to light. CN III, IV, VI: extraocular movement are normal. No ptosis. CN V: Facial sensation is intact to light touch CN VII: Face is symmetric with normal eye closure  CN VIII: Hearing is normal to causal conversation. CN IX, X: Phonation is normal. CN XI: Head turning and shoulder shrug are intact  MOTOR: Right hand intention tremor, mild weakness, fixation on rapid rotating movement  REFLEXES: Brisk and symmetric  SENSORY: Intact to light touch,    COORDINATION: Significant right finger-to-nose dysmetria, mild right heel-to-shin dysmetria  GAIT/STANCE: Push-up to get up from seated position wide-based, unsteady dragging right leg  REVIEW OF SYSTEMS:  Full 14 system review of systems performed and notable only for as above All other review  of systems were negative.   ALLERGIES: Allergies  Allergen Reactions   Hydrocodone-Acetaminophen  Itching and Nausea Only         HOME MEDICATIONS: Current Outpatient Medications  Medication Sig Dispense Refill   acetaminophen  (TYLENOL ) 500 MG tablet Take 2 tablets (1,000 mg total) by mouth every 6 (six) hours as needed for mild pain or moderate pain. 30 tablet 0   albuterol  (VENTOLIN  HFA) 108 (90 Base) MCG/ACT inhaler Inhale 2 puffs into the lungs every 6 (six) hours as needed for wheezing or shortness of breath. 8.5 g 1   aspirin  EC  81 MG tablet Take 81 mg by mouth daily. Swallow whole.     BREO ELLIPTA 200-25 MCG/ACT AEPB Inhale 1 puff into the lungs daily.     cholecalciferol  (VITAMIN D3) 25 MCG (1000 UNIT) tablet Take 1,000 Units by mouth daily.     cyclobenzaprine  (FLEXERIL ) 10 MG tablet Take 1 tablet (10 mg total) by mouth 2 (two) times daily as needed for muscle spasms. 20 tablet 0   fluticasone-salmeterol (ADVAIR) 500-50 MCG/ACT AEPB Inhale 1 puff into the lungs in the morning and at bedtime.     furosemide (LASIX) 40 MG tablet Take 40 mg by mouth 2 (two) times daily.     gabapentin  (NEURONTIN ) 300 MG capsule Take 300 mg by mouth at bedtime.     guaiFENesin -dextromethorphan  (ROBITUSSIN DM) 100-10 MG/5ML syrup Take 10 mLs by mouth every 6 (six) hours as needed for cough. 236 mL 0   hydrALAZINE (APRESOLINE) 50 MG tablet Take 50 mg by mouth 4 (four) times daily.     loratadine (CLARITIN) 10 MG tablet Take 10 mg by mouth daily.     metoprolol  tartrate (LOPRESSOR ) 25 MG tablet Take 12.5 mg by mouth daily.     oxyCODONE -acetaminophen  (PERCOCET) 10-325 MG tablet Take 1 tablet by mouth 3 (three) times daily as needed.     Potassium Chloride  ER 20 MEQ TBCR Take 1 tablet by mouth daily.     rosuvastatin  (CRESTOR ) 10 MG tablet Take 10 mg by mouth at bedtime.     No current facility-administered medications for this visit.    PAST MEDICAL HISTORY: Past Medical History:  Diagnosis Date   Back pain    COPD (chronic obstructive pulmonary disease) (HCC)    Dyspnea    Heart murmur    Multiple sclerosis (HCC)    Tremors of nervous system    Right    PAST SURGICAL HISTORY: Past Surgical History:  Procedure Laterality Date   ADENOIDECTOMY     broken leg     CRANIOTOMY Right 01/06/2022   Procedure: Right Craniotomy for Subdural Hematoma;  Surgeon: Joshua Alm RAMAN, MD;  Location[facility] OR;  Service: Neurosurgery;  Laterality: Right;  RM 21   TONSILLECTOMY      FAMILY HISTORY: Family History  Problem Relation Age of Onset    Heart Problems Mother     SOCIAL HISTORY: Social History   Socioeconomic History   Marital status: Single    Spouse name: Not on file   Number of children: 0   Years of education: GED   Highest education level: Not on file  Occupational History    Employer: UNEMPLOYED    Comment: Disabled  Tobacco Use   Smoking status: Every Day    Current packs/day: 0.50    Types: Cigarettes   Smokeless tobacco: Never   Tobacco comments:    Half Pack  Vaping Use   Vaping status: Never Used  Substance and  Sexual Activity   Alcohol use: Yes    Alcohol/week: 1.0 standard drink of alcohol    Types: 1 Standard drinks or equivalent per week    Comment: quit 2 years ago..   pt is now drinking on occ - very rare maybe once a month.   Drug use: No   Sexual activity: Not Currently  Other Topics Concern   Not on file  Social History Narrative   Patient lives at home with Erminio Goodell.   Patient does not have a significant other.    Patient is disabled.   Patient has no children.    Patient has his GED   Right handed.   Caffeine - four glasses and some sodas   Social Drivers of Corporate investment banker Strain: Not on file  Food Insecurity: No Food Insecurity (08/19/2023)   Hunger Vital Sign    Worried About Running Out of Food in the Last Year: Never true    Ran Out of Food in the Last Year: Never true  Transportation Needs: No Transportation Needs (08/19/2023)   PRAPARE - Administrator, Civil Service (Medical): No    Lack of Transportation (Non-Medical): No  Physical Activity: Not on file  Stress: Not on file  Social Connections: Socially Isolated (08/19/2023)   Social Connection and Isolation Panel    Frequency of Communication with Friends and Family: Twice a week    Frequency of Social Gatherings with Friends and Family: Never    Attends Religious Services: Never    Database administrator or Organizations: No    Attends Banker Meetings: Never     Marital Status: Never married  Intimate Partner Violence: Not At Risk (08/19/2023)   Humiliation, Afraid, Rape, and Kick questionnaire    Fear of Current or Ex-Partner: No    Emotionally Abused: No    Physically Abused: No    Sexually Abused: No      Modena Callander, M.D. Ph.D.  [facility] [address], [address] [place], [place] [address] Ph: [phone] Fax: [phone]  CC:  No referring provider defined for this encounter.  Pcp, No
